# Patient Record
Sex: Male | Born: 1950 | Race: White | Hispanic: No | Marital: Single | Smoking: Never smoker
Health system: Southern US, Community
[De-identification: ages and names within clinical notes are randomized; demographics above are authoritative.]

## PROBLEM LIST (undated history)

## (undated) DIAGNOSIS — F1010 Alcohol abuse, uncomplicated: Secondary | ICD-10-CM

## (undated) DIAGNOSIS — H269 Unspecified cataract: Secondary | ICD-10-CM

## (undated) DIAGNOSIS — S069XAA Unspecified intracranial injury with loss of consciousness status unknown, initial encounter: Secondary | ICD-10-CM

## (undated) DIAGNOSIS — J40 Bronchitis, not specified as acute or chronic: Secondary | ICD-10-CM

## (undated) DIAGNOSIS — H35039 Hypertensive retinopathy, unspecified eye: Secondary | ICD-10-CM

## (undated) DIAGNOSIS — M1990 Unspecified osteoarthritis, unspecified site: Secondary | ICD-10-CM

## (undated) DIAGNOSIS — I8390 Asymptomatic varicose veins of unspecified lower extremity: Secondary | ICD-10-CM

## (undated) DIAGNOSIS — I10 Essential (primary) hypertension: Secondary | ICD-10-CM

## (undated) DIAGNOSIS — D496 Neoplasm of unspecified behavior of brain: Secondary | ICD-10-CM

## (undated) DIAGNOSIS — K219 Gastro-esophageal reflux disease without esophagitis: Secondary | ICD-10-CM

## (undated) DIAGNOSIS — S069X9A Unspecified intracranial injury with loss of consciousness of unspecified duration, initial encounter: Secondary | ICD-10-CM

## (undated) DIAGNOSIS — E669 Obesity, unspecified: Secondary | ICD-10-CM

## (undated) DIAGNOSIS — J45909 Unspecified asthma, uncomplicated: Secondary | ICD-10-CM

## (undated) HISTORY — DX: Alcohol abuse, uncomplicated: F10.10

## (undated) HISTORY — PX: HEMORRHOID SURGERY: SHX153

## (undated) HISTORY — DX: Obesity, unspecified: E66.9

## (undated) HISTORY — DX: Unspecified asthma, uncomplicated: J45.909

## (undated) HISTORY — DX: Hypertensive retinopathy, unspecified eye: H35.039

## (undated) HISTORY — DX: Unspecified osteoarthritis, unspecified site: M19.90

## (undated) HISTORY — DX: Asymptomatic varicose veins of unspecified lower extremity: I83.90

## (undated) HISTORY — DX: Gastro-esophageal reflux disease without esophagitis: K21.9

---

## 2000-10-03 ENCOUNTER — Encounter: Payer: Self-pay | Admitting: Family Medicine

## 2000-10-03 ENCOUNTER — Encounter: Admission: RE | Admit: 2000-10-03 | Discharge: 2000-10-03 | Payer: Self-pay | Admitting: Family Medicine

## 2001-12-25 ENCOUNTER — Emergency Department (HOSPITAL_COMMUNITY): Admission: EM | Admit: 2001-12-25 | Discharge: 2001-12-25 | Payer: Self-pay

## 2002-04-10 ENCOUNTER — Emergency Department (HOSPITAL_COMMUNITY): Admission: EM | Admit: 2002-04-10 | Discharge: 2002-04-10 | Payer: Self-pay

## 2002-11-18 ENCOUNTER — Emergency Department (HOSPITAL_COMMUNITY): Admission: EM | Admit: 2002-11-18 | Discharge: 2002-11-18 | Payer: Self-pay | Admitting: Emergency Medicine

## 2002-11-18 ENCOUNTER — Encounter: Payer: Self-pay | Admitting: Emergency Medicine

## 2003-07-14 ENCOUNTER — Encounter: Payer: Self-pay | Admitting: Emergency Medicine

## 2003-07-14 ENCOUNTER — Inpatient Hospital Stay (HOSPITAL_COMMUNITY): Admission: EM | Admit: 2003-07-14 | Discharge: 2003-07-25 | Payer: Self-pay

## 2003-07-14 ENCOUNTER — Encounter: Payer: Self-pay | Admitting: General Surgery

## 2003-07-31 ENCOUNTER — Inpatient Hospital Stay (HOSPITAL_COMMUNITY): Admission: EM | Admit: 2003-07-31 | Discharge: 2003-08-05 | Payer: Self-pay | Admitting: Emergency Medicine

## 2004-04-21 ENCOUNTER — Emergency Department (HOSPITAL_COMMUNITY): Admission: EM | Admit: 2004-04-21 | Discharge: 2004-04-21 | Payer: Self-pay | Admitting: Emergency Medicine

## 2004-07-10 ENCOUNTER — Emergency Department (HOSPITAL_COMMUNITY): Admission: EM | Admit: 2004-07-10 | Discharge: 2004-07-11 | Payer: Self-pay | Admitting: Emergency Medicine

## 2004-07-10 ENCOUNTER — Emergency Department (HOSPITAL_COMMUNITY): Admission: EM | Admit: 2004-07-10 | Discharge: 2004-07-10 | Payer: Self-pay | Admitting: Emergency Medicine

## 2004-07-13 ENCOUNTER — Emergency Department (HOSPITAL_COMMUNITY): Admission: EM | Admit: 2004-07-13 | Discharge: 2004-07-14 | Payer: Self-pay | Admitting: Emergency Medicine

## 2004-07-20 ENCOUNTER — Emergency Department (HOSPITAL_COMMUNITY): Admission: EM | Admit: 2004-07-20 | Discharge: 2004-07-21 | Payer: Self-pay | Admitting: Emergency Medicine

## 2004-08-20 ENCOUNTER — Inpatient Hospital Stay (HOSPITAL_COMMUNITY): Admission: EM | Admit: 2004-08-20 | Discharge: 2004-08-25 | Payer: Self-pay | Admitting: Psychiatry

## 2004-08-20 ENCOUNTER — Ambulatory Visit: Payer: Self-pay | Admitting: Psychiatry

## 2004-08-27 ENCOUNTER — Emergency Department (HOSPITAL_COMMUNITY): Admission: EM | Admit: 2004-08-27 | Discharge: 2004-08-27 | Payer: Self-pay | Admitting: Emergency Medicine

## 2004-09-01 ENCOUNTER — Emergency Department (HOSPITAL_COMMUNITY): Admission: EM | Admit: 2004-09-01 | Discharge: 2004-09-01 | Payer: Self-pay | Admitting: Emergency Medicine

## 2004-09-12 ENCOUNTER — Emergency Department (HOSPITAL_COMMUNITY): Admission: EM | Admit: 2004-09-12 | Discharge: 2004-09-12 | Payer: Self-pay | Admitting: Emergency Medicine

## 2004-10-31 ENCOUNTER — Emergency Department (HOSPITAL_COMMUNITY): Admission: EM | Admit: 2004-10-31 | Discharge: 2004-10-31 | Payer: Self-pay | Admitting: *Deleted

## 2005-04-22 ENCOUNTER — Emergency Department (HOSPITAL_COMMUNITY): Admission: EM | Admit: 2005-04-22 | Discharge: 2005-04-22 | Payer: Self-pay | Admitting: Emergency Medicine

## 2005-06-19 ENCOUNTER — Emergency Department (HOSPITAL_COMMUNITY): Admission: EM | Admit: 2005-06-19 | Discharge: 2005-06-20 | Payer: Self-pay | Admitting: Emergency Medicine

## 2005-06-21 ENCOUNTER — Encounter (HOSPITAL_COMMUNITY): Admission: RE | Admit: 2005-06-21 | Discharge: 2005-09-19 | Payer: Self-pay | Admitting: Orthopaedic Surgery

## 2006-08-05 ENCOUNTER — Emergency Department (HOSPITAL_COMMUNITY): Admission: EM | Admit: 2006-08-05 | Discharge: 2006-08-05 | Payer: Self-pay | Admitting: Emergency Medicine

## 2007-04-26 ENCOUNTER — Emergency Department (HOSPITAL_COMMUNITY): Admission: EM | Admit: 2007-04-26 | Discharge: 2007-04-26 | Payer: Self-pay | Admitting: Emergency Medicine

## 2009-10-02 ENCOUNTER — Emergency Department (HOSPITAL_COMMUNITY): Admission: EM | Admit: 2009-10-02 | Discharge: 2009-10-02 | Payer: Self-pay | Admitting: Family Medicine

## 2010-05-05 ENCOUNTER — Encounter: Admission: RE | Admit: 2010-05-05 | Discharge: 2010-05-05 | Payer: Self-pay | Admitting: Podiatry

## 2010-12-08 ENCOUNTER — Inpatient Hospital Stay (HOSPITAL_COMMUNITY)
Admission: EM | Admit: 2010-12-08 | Discharge: 2010-12-13 | Payer: Self-pay | Source: Home / Self Care | Attending: Internal Medicine | Admitting: Internal Medicine

## 2010-12-09 NOTE — H&P (Signed)
NAMEMarland Kitchen  KEYANDRE, PILEGGI NO.:  [id number]  MEDICAL RECORD NO.:  [id number]          PATIENT TYPE:  EMS  LOCATION:  ED                           FACILITY:  [facility]  PHYSICIAN:  Vania Rea, M.D. DATE OF BIRTH:  08/13/1951  DATE OF ADMISSION:  12/08/2010 DATE OF DISCHARGE:                             HISTORY & PHYSICAL   PRIMARY CARE PHYSICIAN:  Knox Royalty, M.D.  CHIEF COMPLAINT:  Abnormal labs.  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  Dr. Yetta Barre reports that the patient takes desmopressin for his diabetes insipidus and additionally takes Micardis/HCTZ for his hypertension; also takes ibuprofen for pain. Patient has had problems with low potassium and initially his desmopressin was adjusted. And when seen in November, his potassium continued to be low. His HCTZ was discontinued. In other words, his Micardis with HCTZ was switched out to plain Micardis. At that time, his labs showed a sodium of 130, a potassium of 3.1, a BUN of 12 and a creatinine of 0.3.  Patient returned for followup yesterday and it was found that he had continued to take his HCTZ. And followup labs done yesterday revealed a sodium of 123, a BUN of 43, a creatinine of 3.19. Patient was contacted today and sent to the [facility] Emergency Room for further management.  Patient's only complaint is pain in his right jaw which comes and goes. He denies any chest pain, shortness of breath or palpitations.  He denies any nausea, vomiting or diarrhea.  He denies any black or bloody stool. He has apparently been drinking a lot of alcohol, difficult to quantify.  PAST MEDICAL HISTORY: 1. 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 2. History of alcohol abuse. 3. Depression. 4. Questionable history of mental retardation. 5. Hypertension.  MEDICATIONS: 1. Micardis/HCTZ 8/25; please note that he is prescribed this without     HCTZ. 2. Ibuprofen 800 mg 3 times daily as needed, takes frequently. 3. Desmopressin 0.1 mg at bedtime. 4. Tramadol 50 mg every 4 hours when necessary.  ALLERGIES:  No known drug allergies.  SOCIAL HISTORY:  Denies illicit drug use. Denies tobacco abuse, but reportedly has been drinking alcohol as a substitute for water, as he has been advised to cut down on his drinking of water.  FAMILY HISTORY:  History of cancer in his family.  No family history of alcoholism or bipolar disorder.  IMMUNIZATION HISTORY:  Patient did receive influenza vaccine on November 15 at his doctor's office, and he did receive a tetanus-DAP in January 2011 at his doctor's office.  REVIEW OF SYSTEMS:  Other than noted above, unremarkable.  PHYSICAL EXAMINATION:  Pleasant middle-aged Caucasian gentleman, lying flat in the stretcher in no acute distress. VITALS: His temperature is 97.3, his pulse is 70, respiration 18, blood pressure 92/62.  He is saturating at 96% on room air. HEENT: His pupils are round and  equal.  Mucous membranes are pink, dry, anicteric.  No cervical lymphadenopathy.  No erythema around the jaw. He does have carious teeth.  No thyromegaly or carotid bruits. CHEST: Clear to auscultation bilaterally. CARDIOVASCULAR SYSTEM: Regular rhythm without murmur. ABDOMEN: Soft, nontender.  No masses. EXTREMITIES: Has flexion contractures at his right elbow and fingers of his right hand, however, he has a very strong grip bilaterally. And other than the contractures, he has no focal weakness or no other focal lateralizing signs.  LABORATORY DATA:  His white count is 5.5, hemoglobin 12.4, MCV 88.7, his platelets are 253. He has a normal differential. His alcohol level  is undetectable.  His sodium is 122, potassium 2.0, chloride 90, CO2 19, glucose is 122, BUN 46, creatinine 3.3.  His calcium is 9.2.  His albumin is 4.1. His liver functions are otherwise unremarkable.  PT, PTT and INR are normal. A two-view chest x-ray shows no acute disease in his chest.  ASSESSMENT: 1. Acute renal failure, questionable acute tubular necrosis. 2. Hypokalemia in the setting of renal failure and dehydration. 3. Hypotension related to dehydration and acute renal failure. 4. Hyponatremia 5. Traumatic brain injury. 6. Diabetes insipidus.  PLAN: 1. We will admit this gentleman for hypertension and investigation of     his renal failure.  We will replete his potassium, give vigorous     intravenous normal saline, consult nephrologist for assistance with     management. 2. We will hold his ACE inhibitor, HCTZ, NSAIDs at this time. Other     plans as per orders.     Vania Rea, M.D.     LC/MEDQ  D:  12/08/2010  T:  12/08/2010  Job:  [id number]  cc:   Knox Royalty, MD  Electronically Signed by Vania Rea M.D. on 12/09/2010 09:38:05 PM

## 2010-12-12 LAB — CORTISOL: Cortisol, Plasma: 10.6 ug/dL

## 2010-12-12 LAB — DIFFERENTIAL
Basophils Absolute: 0 10*3/uL (ref 0.0–0.1)
Eosinophils Absolute: 0.2 10*3/uL (ref 0.0–0.7)
Eosinophils Relative: 3 % (ref 0–5)

## 2010-12-12 LAB — COMPREHENSIVE METABOLIC PANEL
ALT: 16 U/L (ref 0–53)
Alkaline Phosphatase: 53 U/L (ref 39–117)
CO2: 19 mEq/L (ref 19–32)
Chloride: 90 mEq/L — ABNORMAL LOW (ref 96–112)
GFR calc non Af Amer: 19 mL/min — ABNORMAL LOW (ref >60)
Glucose, Bld: 122 mg/dL — ABNORMAL HIGH (ref 70–99)
Potassium: 2 mEq/L — CL (ref 3.5–5.1)
Sodium: 122 mEq/L — ABNORMAL LOW (ref 135–145)

## 2010-12-12 LAB — ETHANOL: Alcohol, Ethyl (B): <5 mg/dL (ref 0–10)

## 2010-12-12 LAB — URINALYSIS, ROUTINE W REFLEX MICROSCOPIC
Bilirubin Urine: NEGATIVE
Nitrite: NEGATIVE
Nitrite: NEGATIVE
Specific Gravity, Urine: 1.012 (ref 1.005–1.030)
Urine Glucose, Fasting: NEGATIVE mg/dL
pH: 6 (ref 5.0–8.0)
pH: 6.5 (ref 5.0–8.0)

## 2010-12-12 LAB — CBC
Hemoglobin: 10.1 g/dL — ABNORMAL LOW (ref 13.0–17.0)
MCV: 88.7 fL (ref 78.0–100.0)
Platelets: 253 10*3/uL (ref 150–400)
Platelets: 183 10*3/uL (ref 150–400)
RBC: 3.06 MIL/uL — ABNORMAL LOW (ref 4.22–5.81)
RDW: 13.3 % (ref 11.5–15.5)
WBC: 5.5 10*3/uL (ref 4.0–10.5)
WBC: 4.7 10*3/uL (ref 4.0–10.5)

## 2010-12-12 LAB — CK: Total CK: 66 U/L (ref 7–232)

## 2010-12-12 LAB — BASIC METABOLIC PANEL
Chloride: 104 mEq/L (ref 96–112)
GFR calc Af Amer: 44 mL/min — ABNORMAL LOW (ref >60)
GFR calc Af Amer: 59 mL/min — ABNORMAL LOW (ref >60)
GFR calc non Af Amer: 37 mL/min — ABNORMAL LOW (ref >60)
GFR calc non Af Amer: 49 mL/min — ABNORMAL LOW (ref >60)
Glucose, Bld: 105 mg/dL — ABNORMAL HIGH (ref 70–99)
Potassium: 2.8 mEq/L — ABNORMAL LOW (ref 3.5–5.1)
Potassium: 2.8 mEq/L — ABNORMAL LOW (ref 3.5–5.1)
Sodium: 125 mEq/L — ABNORMAL LOW (ref 135–145)
Sodium: 126 mEq/L — ABNORMAL LOW (ref 135–145)

## 2010-12-12 LAB — CREATININE, URINE, RANDOM: Creatinine, Urine: 39.4 mg/dL

## 2010-12-12 LAB — MAGNESIUM: Magnesium: 2.4 mg/dL (ref 1.5–2.5)

## 2010-12-12 LAB — TSH: TSH: 1.497 u[IU]/mL (ref 0.350–4.500)

## 2010-12-12 LAB — RAPID URINE DRUG SCREEN, HOSP PERFORMED: Cocaine: NOT DETECTED

## 2010-12-12 LAB — PROTIME-INR: Prothrombin Time: 13.6 seconds (ref 11.6–15.2)

## 2010-12-12 LAB — POTASSIUM: Potassium: 2.9 mEq/L — ABNORMAL LOW (ref 3.5–5.1)

## 2010-12-13 LAB — COMPREHENSIVE METABOLIC PANEL
ALT: 13 U/L (ref 0–53)
AST: 11 U/L (ref 0–37)
Alkaline Phosphatase: 20 U/L — ABNORMAL LOW (ref 39–117)
CO2: 16 mEq/L — ABNORMAL LOW (ref 19–32)
Calcium: 8.1 mg/dL — ABNORMAL LOW (ref 8.4–10.5)
GFR calc Af Amer: >60 mL/min (ref >60)
Glucose, Bld: 89 mg/dL (ref 70–99)
Potassium: 3.6 mEq/L (ref 3.5–5.1)
Sodium: 137 mEq/L (ref 135–145)
Total Protein: 4.9 g/dL — ABNORMAL LOW (ref 6.0–8.3)

## 2010-12-13 LAB — BASIC METABOLIC PANEL
BUN: 22 mg/dL (ref 6–23)
CO2: 17 mEq/L — ABNORMAL LOW (ref 19–32)
CO2: 17 mEq/L — ABNORMAL LOW (ref 19–32)
Calcium: 8.1 mg/dL — ABNORMAL LOW (ref 8.4–10.5)
Chloride: 116 mEq/L — ABNORMAL HIGH (ref 96–112)
Creatinine, Ser: 0.83 mg/dL (ref 0.4–1.5)
GFR calc Af Amer: >60 mL/min (ref >60)
GFR calc non Af Amer: >60 mL/min (ref >60)
Glucose, Bld: 87 mg/dL (ref 70–99)
Potassium: 3.5 mEq/L (ref 3.5–5.1)
Sodium: 133 mEq/L — ABNORMAL LOW (ref 135–145)
Sodium: 136 mEq/L (ref 135–145)

## 2010-12-13 LAB — FERRITIN: Ferritin: 117 ng/mL (ref 22–322)

## 2010-12-13 LAB — CBC
HCT: 27.5 % — ABNORMAL LOW (ref 39.0–52.0)
Hemoglobin: 10 g/dL — ABNORMAL LOW (ref 13.0–17.0)
MCHC: 36.4 g/dL — ABNORMAL HIGH (ref 30.0–36.0)
MCV: 89.9 fL (ref 78.0–100.0)
RDW: 13.5 % (ref 11.5–15.5)
WBC: 3.2 10*3/uL — ABNORMAL LOW (ref 4.0–10.5)

## 2010-12-13 LAB — PROTEIN, URINE, RANDOM: Total Protein, Urine: 3 mg/dL

## 2010-12-13 LAB — SODIUM, URINE, RANDOM: Sodium, Ur: 38 mEq/L

## 2010-12-13 LAB — IRON AND TIBC
Iron: 93 ug/dL (ref 42–135)
UIBC: 102 ug/dL

## 2010-12-14 LAB — PROTEIN ELECTROPH W RFLX QUANT IMMUNOGLOBULINS
Albumin ELP: 62.4 % (ref 55.8–66.1)
Alpha-1-Globulin: 5.5 % — ABNORMAL HIGH (ref 2.9–4.9)
Beta 2: 3.2 % (ref 3.2–6.5)
Beta Globulin: 5.1 % (ref 4.7–7.2)
Gamma Globulin: 13.7 % (ref 11.1–18.8)

## 2010-12-22 NOTE — Discharge Summary (Signed)
NAMEMarland Kitchen  [name], [name] NO.:  [id number]  MEDICAL RECORD NO.:  [id number]          PATIENT TYPE:  INP  LOCATION:  1414                         FACILITY:  [facility]  PHYSICIAN:  Pincus Large, MD     DATE OF BIRTH:  1951/10/06  DATE OF ADMISSION:  12/08/2010 DATE OF DISCHARGE:  12/12/2010                              DISCHARGE SUMMARY   PRIMARY CARE PHYSICIAN:  Dr. Knox Royalty.  TIME SPENT ON DISCHARGE SUMMARY:  35 minutes.  REASON FOR ADMISSION:  Abnormal labs.  DISCHARGE DIAGNOSES: 1. Acute renal failure most likely secondary to acute tubular     necrosis, possibly papillary necrosis, given the use of new ARB     with nonsteroidal. 2. Hyponatremia, resolved. 3. Anemia. 4. Metabolic acidosis, resolved. 5. Hypokalemia, resolved. 6. History of diabetes insipidus. 7. History of alcohol abuse. 8. Questionable history of mental retardation.  DISCHARGE MEDICATIONS: 1. Folic acid 1 mg daily. 2. Thiamine 100 mg daily. 3. Tramadol 50 mg q.6 p.r.n. 4. Desmopressin 0.1 mg p.o. daily.  LABORATORY DATA AND IMAGING STUDIES:  Images that were done; chest x-ray was done on December 08, 2010, which shows no acute lung disease. Ultrasound was done on January 21 which showed no acute finding, bilateral renal cortical echogenicity which suggests chronic medical renal problem.  Chest x-ray was done on January 23, which showed no active lung disease.  Labs when the patient came in, his WBC was 5.8, hemoglobin was 12.4, platelets were 253.  His potassium was 2, sodium was 122, chloride 90, bicarb was 19, glucose was 122, BUN was 46, creatinine was 3.30.  UA was normal.  Labs on discharge, his sodium is 137, potassium is 3.6, chloride is 117, bicarb is 16, glucose is 89, BUN is 9, and creatinine 0.84.  HOSPITAL COURSE:  Mr. Hensch is a 60 year old white male with past medical history significant for ongoing alcohol abuse, hypertension, history of traumatic brain injury,  which by report, was complicated by diabetes insipidus.  He has been managed by desmopressin as an outpatient, but the patient reported that he was prescribed desmopressin, Micardis, hydrochlorothiazide for his hypertension.  He was found to have low potassium and questionable low sodium.  The patient was told to watch his water intake.  His hydrochlorothiazide was discontinued, but continued Micardis.  The patient admitted to drinking 9 beers a day and went to have labs on January 18, which was found to have a creatinine of 3.1, BUN 43, sodium 123.  Creatinine was recently 0.7.  The patient was admitted on January 19 for abnormal blood work. He received IV fluid.  ACE was discontinued.  Hydrochlorothiazide was discontinued.  Nonsteroidal anti-inflammatory was discontinued.  He was seen by the nephrologist, Dr. Terrial Rhodes.  The patient's acute renal failure resolved.  His hyponatremia resolved.  His vital signs throughout admission, his blood pressure was in normal limits even on the lower side, so I think I could discontinue the blood pressure medication, and to follow up with his primary care to check his blood pressure, may be need to start a new medication other than just ARB or ACE at  this point.  The patient's vitals on discharge were temperature 98, pulse of 73, respirations 20, blood pressure 103/67.  We had a social work consult and the patient wanted to go home.  The patient has a guardian who has been taking care of the patient at home for a long time.  The patient had a PT eval and they recommended home health PT as an outpatient.  DISPOSITION:  Home.  DIET:  2-g sodium.          ______________________________ Pincus Large, MD     SA/MEDQ  D:  12/13/2010  T:  12/13/2010  Job:  [id number]  Electronically Signed by Pincus Large MD on 12/22/2010 03:32:42 PM

## 2010-12-23 NOTE — Consult Note (Signed)
NAMEMarland Kitchen  VELTON, ROSELLE NO.:  [id number]  MEDICAL RECORD NO.:  [id number]          PATIENT TYPE:  INP  LOCATION:  1414                         [facility]ACILITY:  [facility]  PHYSICIAN:  Terrial Rhodes, M.D.DATE OF BIRTH:  23-Dec-1950  DATE OF CONSULTATION:  12/09/2010 DATE OF DISCHARGE:                                CONSULTATION   CONSULTING PHYSICIAN:  Vania Rea, MD  REASON FOR CONSULTATION:  Acute renal failure and hyponatremia.  HISTORY OF PRESENT ILLNESS:  Mr. Cowles is a 60 year old white male with past medical history significant for ongoing alcohol abuse, hypertension and a history of traumatic brain injury which by report was complicated by diabetes insipidus, has been managed with desmopressin by Dr. Knox Royalty as an outpatient.  According to the reports, the patient was prescribed desmopressin, Micardis HCT for hypertension.  He was found to have a low potassium and questionable low sodium as the patient reports he was told to watch his water intake.  His hydrochlorothiazide was discontinued  but continued to Micardis.  The patient admits to drinking nine beers a day and went to have repeat laboratories on January 18th and was instructed to go to the hospital on the 19th because his serum sodium was 123, BUN 43 and creatinine 3.19. Previously, his creatinine was less than 1 and most recently 0.7.  We were asked to further evaluate and manage his acute renal failure as well as his hyponatremia.  The patient denies any nausea, vomiting, diarrhea.  He did report decreased urine output the day prior to admission and he has been taking ibuprofen for pain in his legs over the last several days as well.  ALLERGIES:  He has no known drug allergies.  PAST MEDICAL HISTORY: 1. Alcohol abuse. 2. Hypertension. 3. Depression. 4. History of traumatic brain injury. 5. History of diabetes insipidus post traumatic brain injury. 6. Medical  nonadherence.  OUTPATIENT MEDICATIONS: 1. Micardis 80 mg a day. 2. Ibuprofen 800 mg 3 times a day. 3. Desmopressin 0.1 mg at bedtime. 4. Tramadol 50 mg every 4 hours.  SOCIAL HISTORY:  Lives alone.  Admits to ongoing alcohol abuse.  No IV drug use.  FAMILY HISTORY:  No family history of kidney disease.  REVIEW OF SYSTEMS:  GENERAL:  He denies any anorexia or malaise. OPHTHALMIC:  No blurred vision, photophobia.  ENT:  No tinnitus, dysphagia or odynophagia.  CARDIAC:  No chest pain, palpitations, orthopnea or PND.  PULMONARY:  No shortness of breath, hemoptysis or productive cough.  GI:  No nausea, vomiting, hematochezia, melena or bright red blood per rectum.  GU:  No dysuria, pyuria, hematuria, urgency, frequency or retention.  RHEUMATOLOGIC:  Complains of bilateral leg pain.  All other systems negative.  PHYSICAL EXAMINATION:  GENERAL:  This is a well-developed, well- nourished man in no apparent distress. VITAL SIGNS:  Temperature 97.6, pulse 68, blood pressure 95/61, respiratory rate is 16.  His intake was 1080, output was 600.  Today, he has put out 1.1 liters. HEENT:  Head normocephalic, atraumatic.  Extraocular muscles intact.  No icterus.  Oropharynx without lesions. NECK:  Supple.  No lymphadenopathy or bruits.  LUNGS:  Clear to auscultation and percussion bilaterally.  No rales, rubs or rhonchi. CARDIAC EXAMINATION:  Regular rate and rhythm.  No precordial rub appreciated. ABDOMINAL EXAMINATION:  Normoactive bowel sounds, soft, nontender, nondistended. EXTREMITIES:  No pitting edema.  LABORATORY DATA AND DIAGNOSTIC STUDIES:  White blood cell count 4.7, hemoglobin 10.1, platelets 183.  Magnesium 2.4, sodium 125, potassium 2.8, chloride 103, CO2 of 15, BUN 41, creatinine 1.9, glucose 150, calcium 8.2.  Urine drug screen was negative.  CPK level was 66.  Urine toxicology screen was negative.  ASSESSMENT/PLAN: 1. Acute kidney injury.  This is likely due to acute  tubular necrosis,     possibly papillary necrosis, given his concomitant use of an ARB     with nonsteroidals; however, given his relative hypotension, this     could be ischemic acute tubular necrosis.  We will check ultrasound     as per primary service.  His urinalysis was negative for blood and     protein, so an acute glomerulonephritis is unlikely.  His     creatinine is already markedly improved with volume replacement.     We will continue to hold ARB and follow his urine output and renal     function.  We will also check protein electrophoresis in both serum     and urine to rule out myeloma and we will follow. 2. Hyponatremia.  Again this is new as he has a history of diabetes     insipidus and should have hypernatremia.  It has been well     documented hyponatremia associated with hydrochlorothiazide as well     as nonsteroidals and angiotensin receptor blockers.  To complicate     things further is his alcohol use and could have the beer     potomania.  We will check urine sodium, urine creatinine, urine     osmolality, serum osmolality, cortisol level, TSH level and we will     continue to follow his sodium closely.  It has gone from 122     yesterday to 125.  We will check it again later today and follow. 3. Anemia.  We will check iron stores, B12, folic acid, guaiac stools     as well as an SPEP and UPEP as above. 4. Hypotension, unclear etiology.  He is off his blood pressure     medicines and will follow.  He is afebrile and asymptomatic. 5. Metabolic acidosis secondary to acute renal failure as above.  We     will follow.  We may want to add bicarbonate.  He has a normal gap     acidosis.  Denies any other ingestion of agents. 6. Hypokalemia, likely due to alcoholism.  We will replete and follow. 7. Leg pain.  Would avoid any nonsteroidals or Cox II inhibitors given     his acute renal failure.  Thank you for this consultation.  We will continue to follow along  with you          ______________________________ Terrial Rhodes, M.D.    JC/MEDQ  D:  12/09/2010  T:  12/09/2010  Job:  [id number]  Electronically Signed by Terrial Rhodes M.D. on 12/23/2010 12:19:11 PM
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
## 2011-04-07 NOTE — Op Note (Signed)
NAME:  [name], [name] NO.:  [id number]   MEDICAL RECORD NO.:  [id number]                   PATIENT TYPE:  OBV   LOCATION:  2550                                 FACILITY:  [facility]   PHYSICIAN:  Myrtie Neither, M.D.                 DATE OF BIRTH:  January 09, 1951   DATE OF PROCEDURE:  07/14/2003  DATE OF DISCHARGE:                                 OPERATIVE REPORT   PREOPERATIVE DIAGNOSIS:  Open left tibial fracture, large open wound.   POSTOPERATIVE DIAGNOSIS:  Open left tibial fracture, large open wound.   PROCEDURE:  Irrigation, excisional debridement, curettement of the tibia  fracture, wound closure, and knee immobilization.   SURGEON:  Myrtie Neither, M.D.   ANESTHESIA:  General   The patient was taken to the operating room and was given preop medications  and placed general endotracheal anesthesia and intubated.  The left lower  leg was scrubbed with Betadine scrub and painted with Betadine solution and  draped in a sterile manner.  Bovie was used for hemostasis.   DESCRIPTION OF THE WOUND:  There was an 8-inch transverse laceration over  the proximal tibial area anteriorly with stellate edges with obvious gross  contamination with debridement.  Inspection of the wound revealed that the  tibia was broken with the outer cortex which apparently must have cut  against the pavement surface.  There are black fragments of either metal or  plastic that was inside of the wound. The wound was contaminated down to the  fascia both medially and laterally.  The skin edges were initially excised  and debrided; the subcutaneous tissue was excisionally debrided; loose  fragments were removed.  Copious and abundant irrigation with some pulsing  irrigation was done, also with the use of IV antibiotic.  The fracture site  along the anterior medial border of the tibia was curetted with a curette  and then thoroughly irrigated.   The fracture, itself, involved the  medial cortex of the tibia.  After  thorough debridement and further inspection, both proximally and distally  into the wound, then the wound was closed with 2-0 Vicryl for the  subcutaneous and skin staples for the skin.  A bulky compressive dressing  was applied and knee immobilizer was applied.   Examination of the knee was done under anesthesia.  Range of motion was  full.  Good medial and lateral stability.  Negative Lachman, negative drawer  test.  The patient tolerated the procedure quite well and went to recovery  room in stable and satisfactory condition.                                               Myrtie Neither, M.D.    AC/MEDQ  D:  07/14/2003  T:  07/15/2003  Job:  [id number]
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
## 2011-11-26 ENCOUNTER — Inpatient Hospital Stay (HOSPITAL_COMMUNITY)
Admission: EM | Admit: 2011-11-26 | Discharge: 2011-11-30 | DRG: 203 | Disposition: A | Discharge status: Home Health | Payer: Medicare Other | Attending: Family Medicine | Admitting: Family Medicine

## 2011-11-26 ENCOUNTER — Encounter: Payer: Self-pay | Admitting: *Deleted

## 2011-11-26 ENCOUNTER — Emergency Department (HOSPITAL_COMMUNITY): Payer: Medicare Other

## 2011-11-26 DIAGNOSIS — Z23 Encounter for immunization: Secondary | ICD-10-CM

## 2011-11-26 DIAGNOSIS — R509 Fever, unspecified: Secondary | ICD-10-CM

## 2011-11-26 DIAGNOSIS — J45909 Unspecified asthma, uncomplicated: Secondary | ICD-10-CM | POA: Diagnosis present

## 2011-11-26 DIAGNOSIS — R062 Wheezing: Secondary | ICD-10-CM

## 2011-11-26 DIAGNOSIS — J209 Acute bronchitis, unspecified: Principal | ICD-10-CM | POA: Diagnosis present

## 2011-11-26 DIAGNOSIS — F79 Unspecified intellectual disabilities: Secondary | ICD-10-CM | POA: Diagnosis present

## 2011-11-26 DIAGNOSIS — I10 Essential (primary) hypertension: Secondary | ICD-10-CM | POA: Diagnosis present

## 2011-11-26 LAB — RESPIRATORY VIRUS PANEL
Bocavirus: NOT DETECTED
Coronavirus229E: NOT DETECTED
CoronavirusHKU1: NOT DETECTED
CoronavirusOC43: NOT DETECTED
Influenza B: NOT DETECTED
Metapneumovirus: NOT DETECTED
Parainfluenza 1: NOT DETECTED
Parainfluenza 2: NOT DETECTED
Parainfluenza 3: NOT DETECTED
Parainfluenza 4: NOT DETECTED
Respiratory Syncytial Virus A: DETECTED — AB

## 2011-11-26 LAB — POCT I-STAT, CHEM 8
Calcium, Ion: 1.23 mmol/L (ref 1.12–1.32)
Chloride: 105 mEq/L (ref 96–112)
Glucose, Bld: 98 mg/dL (ref 70–99)
HCT: 41 % (ref 39.0–52.0)
TCO2: 25 mmol/L (ref 0–100)

## 2011-11-26 LAB — CBC
HCT: 40 % (ref 39.0–52.0)
MCH: 30.5 pg (ref 26.0–34.0)
MCHC: 33.5 g/dL (ref 30.0–36.0)
MCV: 90.9 fL (ref 78.0–100.0)
Platelets: 195 10*3/uL (ref 150–400)
RDW: 14.4 % (ref 11.5–15.5)

## 2011-11-26 LAB — DIFFERENTIAL
Basophils Absolute: 0 10*3/uL (ref 0.0–0.1)
Basophils Relative: 0 % (ref 0–1)
Eosinophils Absolute: 0.1 10*3/uL (ref 0.0–0.7)
Eosinophils Relative: 2 % (ref 0–5)
Lymphocytes Relative: 20 % (ref 12–46)
Monocytes Absolute: 0.7 10*3/uL (ref 0.1–1.0)

## 2011-11-26 MED ORDER — METHYLPREDNISOLONE SODIUM SUCC 125 MG IJ SOLR
125.0000 mg | INTRAMUSCULAR | Status: AC
  Administered 2011-11-26: 125 mg via INTRAVENOUS
  Filled 2011-11-26: qty 2

## 2011-11-26 MED ORDER — ACETAMINOPHEN 325 MG PO TABS
ORAL_TABLET | ORAL | Status: AC
  Administered 2011-11-26: 650 mg via ORAL
  Filled 2011-11-26: qty 2

## 2011-11-26 MED ORDER — OXYCODONE HCL 5 MG PO TABS: 5.0000 mg | ORAL_TABLET | ORAL | Status: DC | PRN

## 2011-11-26 MED ORDER — DEXTROSE 5 % IV SOLN
1.0000 g | INTRAVENOUS | Status: DC
  Administered 2011-11-27 – 2011-11-29 (×3): 1 g via INTRAVENOUS
  Filled 2011-11-26 (×4): qty 10

## 2011-11-26 MED ORDER — IPRATROPIUM BROMIDE 0.02 % IN SOLN
0.5000 mg | Freq: Once | RESPIRATORY_TRACT | Status: AC
  Administered 2011-11-26: 0.5 mg via RESPIRATORY_TRACT
  Filled 2011-11-26: qty 2.5

## 2011-11-26 MED ORDER — ALUM & MAG HYDROXIDE-SIMETH 200-200-20 MG/5ML PO SUSP
30.0000 mL | Freq: Four times a day (QID) | ORAL | Status: DC | PRN
  Filled 2011-11-26: qty 30

## 2011-11-26 MED ORDER — SODIUM CHLORIDE 0.9 % IV SOLN
INTRAVENOUS | Status: AC
  Administered 2011-11-26: 21:00:00 via INTRAVENOUS

## 2011-11-26 MED ORDER — OSELTAMIVIR PHOSPHATE 75 MG PO CAPS
75.0000 mg | ORAL_CAPSULE | Freq: Two times a day (BID) | ORAL | Status: DC
  Administered 2011-11-27 – 2011-11-29 (×6): 75 mg via ORAL
  Filled 2011-11-26 (×7): qty 1

## 2011-11-26 MED ORDER — DEXTROSE 5 % IV SOLN
500.0000 mg | INTRAVENOUS | Status: DC
  Administered 2011-11-27 – 2011-11-30 (×4): 500 mg via INTRAVENOUS
  Filled 2011-11-26 (×5): qty 500

## 2011-11-26 MED ORDER — DEXTROSE 5 % IV SOLN: 500.0000 mg | Freq: Once | INTRAVENOUS | Status: DC

## 2011-11-26 MED ORDER — SODIUM CHLORIDE 0.9 % IV SOLN
INTRAVENOUS | Status: DC
  Administered 2011-11-27: via INTRAVENOUS

## 2011-11-26 MED ORDER — ALBUTEROL SULFATE (5 MG/ML) 0.5% IN NEBU
2.5000 mg | INHALATION_SOLUTION | RESPIRATORY_TRACT | Status: DC
  Administered 2011-11-26 – 2011-11-27 (×4): 2.5 mg via RESPIRATORY_TRACT
  Filled 2011-11-26 (×4): qty 0.5

## 2011-11-26 MED ORDER — ALBUTEROL SULFATE (5 MG/ML) 0.5% IN NEBU
5.0000 mg | INHALATION_SOLUTION | Freq: Once | RESPIRATORY_TRACT | Status: AC
  Administered 2011-11-26: 5 mg via RESPIRATORY_TRACT
  Filled 2011-11-26: qty 0.5

## 2011-11-26 MED ORDER — ALBUTEROL SULFATE (5 MG/ML) 0.5% IN NEBU: 2.5000 mg | INHALATION_SOLUTION | Freq: Once | RESPIRATORY_TRACT | Status: DC

## 2011-11-26 MED ORDER — ENOXAPARIN SODIUM 40 MG/0.4ML ~~LOC~~ SOLN
40.0000 mg | SUBCUTANEOUS | Status: DC
  Administered 2011-11-27 – 2011-11-30 (×4): 40 mg via SUBCUTANEOUS
  Filled 2011-11-26 (×4): qty 0.4

## 2011-11-26 MED ORDER — ZOLPIDEM TARTRATE 5 MG PO TABS
5.0000 mg | ORAL_TABLET | Freq: Every evening | ORAL | Status: DC | PRN
Start: 2011-11-26 — End: 2011-11-30

## 2011-11-26 MED ORDER — ONDANSETRON HCL 4 MG PO TABS: 4.0000 mg | ORAL_TABLET | Freq: Four times a day (QID) | ORAL | Status: DC | PRN

## 2011-11-26 MED ORDER — ALBUMIN HUMAN 5 % IV SOLN: 12.5000 g | Freq: Once | INTRAVENOUS | Status: DC

## 2011-11-26 MED ORDER — METHYLPREDNISOLONE SODIUM SUCC 125 MG IJ SOLR
60.0000 mg | Freq: Two times a day (BID) | INTRAMUSCULAR | Status: DC
Start: 2011-11-28 — End: 2011-11-27
  Filled 2011-11-26: qty 0.96

## 2011-11-26 MED ORDER — IPRATROPIUM BROMIDE 0.02 % IN SOLN
0.5000 mg | RESPIRATORY_TRACT | Status: DC
  Administered 2011-11-26 – 2011-11-27 (×4): 0.5 mg via RESPIRATORY_TRACT
  Filled 2011-11-26 (×4): qty 2.5

## 2011-11-26 MED ORDER — ALBUTEROL SULFATE (5 MG/ML) 0.5% IN NEBU
2.5000 mg | INHALATION_SOLUTION | Freq: Once | RESPIRATORY_TRACT | Status: AC
  Administered 2011-11-26: 2.5 mg via RESPIRATORY_TRACT
  Filled 2011-11-26: qty 0.5

## 2011-11-26 MED ORDER — ONDANSETRON HCL 4 MG/2ML IJ SOLN: 4.0000 mg | Freq: Four times a day (QID) | INTRAMUSCULAR | Status: DC | PRN

## 2011-11-26 MED ORDER — DEXTROSE 5 % IV SOLN
1.0000 g | Freq: Once | INTRAVENOUS | Status: AC
  Administered 2011-11-26: 1 g via INTRAVENOUS
  Filled 2011-11-26: qty 10

## 2011-11-26 MED ORDER — HYDROMORPHONE HCL PF 1 MG/ML IJ SOLN: 0.5000 mg | INTRAMUSCULAR | Status: DC | PRN

## 2011-11-26 MED ORDER — ACETAMINOPHEN 325 MG PO TABS
650.0000 mg | ORAL_TABLET | Freq: Once | ORAL | Status: AC
  Administered 2011-11-26: 650 mg via ORAL

## 2011-11-26 MED ORDER — ACETAMINOPHEN 650 MG RE SUPP: 650.0000 mg | Freq: Four times a day (QID) | RECTAL | Status: DC | PRN

## 2011-11-26 MED ORDER — METHYLPREDNISOLONE SODIUM SUCC 125 MG IJ SOLR
125.0000 mg | Freq: Four times a day (QID) | INTRAMUSCULAR | Status: DC
  Administered 2011-11-27 (×2): 125 mg via INTRAVENOUS
  Filled 2011-11-26 (×3): qty 2

## 2011-11-26 MED ORDER — ACETAMINOPHEN 325 MG PO TABS: 650.0000 mg | ORAL_TABLET | Freq: Four times a day (QID) | ORAL | Status: DC | PRN

## 2011-11-26 NOTE — ED Provider Notes (Signed)
History     CSN: [id number]  Arrival date & time 11/26/11  1707   First MD Initiated Contact with Patient 11/26/11 1842      Chief Complaint  Patient presents with  . Wheezing    (Consider location/radiation/quality/duration/timing/severity/associated sxs/prior treatment) HPI Pt presents to department for evaluation of SOB. Pt states chest cold x3 days. Pt states increasing SOB, becomes worse with movement. Expiratory wheezing noted upon arrival to exam room. Denies pain at the present. He is alert and oriented x4. Skin warm and dry.  History reviewed. No pertinent past medical history.  History reviewed. No pertinent past surgical history.  History reviewed. No pertinent family history.  History  Substance Use Topics  . Smoking status: Never Smoker   . Smokeless tobacco: Not on file  . Alcohol Use: No      Review of Systems  All other systems reviewed and are negative.    Allergies  Review of patient's allergies indicates no known allergies.  Home Medications   Current Outpatient Rx  Name Route Sig Dispense Refill  . DESMOPRESSIN ACETATE 0.1 MG PO TABS Oral Take 0.1 mg by mouth daily.      Marland Kitchen GABAPENTIN 300 MG PO CAPS Oral Take 300 mg by mouth 3 (three) times daily.      Marland Kitchen HYDROCODONE-ACETAMINOPHEN 5-325 MG PO TABS Oral Take 1 tablet by mouth every 6 (six) hours as needed. For pain     . OXYBUTYNIN CHLORIDE 5 MG PO TABS Oral Take 5 mg by mouth at bedtime.      . TAMSULOSIN HCL 0.4 MG PO CAPS Oral Take 0.4 mg by mouth daily.      . TELMISARTAN 80 MG PO TABS Oral Take 80 mg by mouth daily.      . TRAMADOL HCL 50 MG PO TABS Oral Take 50 mg by mouth every 6 (six) hours as needed. For pain     . ALBUTEROL SULFATE HFA 108 (90 BASE) MCG/ACT IN AERS Inhalation Inhale 2 puffs into the lungs every 4 (four) hours as needed for wheezing. 1 Inhaler 0  . AZITHROMYCIN 250 MG PO TABS Oral Take 1 tablet (250 mg total) by mouth once. Take last dose January 11 AM. 1 each 0  .  PREDNISONE 20 MG PO TABS Oral Take 1 tablet (20 mg total) by mouth daily with breakfast. Start 1/11. 5 tablet 0    BP 136/87  Pulse 82  Temp(Src) 97.9 F (36.6 C) (Oral)  Resp 20  Ht 5\' 7"  (1.702 m)  Wt 222 lb 12.8 oz (101.061 kg)  BMI 34.90 kg/m2  SpO2 95%  Physical Exam  Nursing note and vitals reviewed. Constitutional: He is oriented to person, place, and time. He appears well-developed and well-nourished. No distress.  HENT:  Head: Normocephalic and atraumatic.  Eyes: Pupils are equal, round, and reactive to light.  Neck: Normal range of motion.  Cardiovascular: Normal rate and intact distal pulses.   Pulmonary/Chest: Accessory muscle usage present. He is in respiratory distress. He has wheezes.  Abdominal: Normal appearance. He exhibits no distension.  Musculoskeletal: Normal range of motion.  Neurological: He is alert and oriented to person, place, and time. No cranial nerve deficit.  Skin: Skin is warm and dry. No rash noted.  Psychiatric: He has a normal mood and affect. His behavior is normal.    ED Course  Procedures (including critical care time)  Labs Reviewed  BASIC METABOLIC PANEL - Abnormal; Notable for the following:    Glucose,  Bld 173 (*)    All other components within normal limits  CBC - Abnormal; Notable for the following:    Hemoglobin 12.9 (*)    HCT 38.2 (*)    All other components within normal limits  RESPIRATORY VIRUS PANEL (18 COMPONENTS) - Abnormal; Notable for the following:    Respiratory Syncytial Virus A DETECTED (*)    All other components within normal limits  CBC  DIFFERENTIAL  ETHANOL  POCT I-STAT, CHEM 8  LAB REPORT - SCANNED   DG Chest 2 View (Final result)   Result time:11/26/11 1901    Final result by Rad Results In Interface (11/26/11 19:01:54)    Narrative:   *RADIOLOGY REPORT*  Clinical Data: Cough, shortness of breath, wheezing.  CHEST - 2 VIEW  Comparison: 12/12/2010.  Findings: Interval mild enlargement of the  cardiac silhouette. Mild diffuse peribronchial thickening and mild prominence of the pulmonary vasculature. Unremarkable bones.  IMPRESSION:  1. Mild bronchitic changes. 2. Interval mild cardiomegaly and mild pulmonary vascular congestion.  Original Report Authenticated By: Darrol Angel, M.D.    No results found.   1. Wheezing   2. Fever       MDM          Nelia Shi, MD 12/08/11 2312

## 2011-11-26 NOTE — ED Notes (Addendum)
Pt presents to department for evaluation of SOB. Pt states chest cold x3 days. Pt states increasing SOB, becomes worse with movement. Expiratory wheezing noted upon arrival to exam room. Denies pain at the present. He is alert and oriented x4. Skin warm and dry.

## 2011-11-26 NOTE — ED Notes (Signed)
The pt has a cold for 3-4 days.  He has a congested cough with wheezing today.  His symptoms are worse today

## 2011-11-26 NOTE — H&P (Addendum)
DATE OF ADMISSION:  11/26/2011  PCP:    Knox Royalty, MD  Chief Complaint:    HPI: [name] is an 61 y.o. male who was brought to the ED for evaluation secondary to complaints of worsening SOB today and loud wheezing and complaints of chest tightness.  He has had wheezing off and on for months, however, patient does not have a previous diagnosis of asthma or COPD, and has never smoked.  He also has had subjective complaints of fevers and chills over the past 2 days.  He reports coughing up a whitish sputum.  He denies myalgias and he also denies chest pain, nausea or vomiting or diarrhea.      Past medical History:        HTN      Traumatic Brain Injury    History reviewed. No pertinent past surgical history.  Medications:  HOME MEDS: Prior to Admission medications   Not on File    Allergies:  No Known Allergies  Social History:   reports that he has never smoked. He does not have any smokeless tobacco history on file. He reports that he does not drink alcohol. His drug history not on file.  Family History: History reviewed. No pertinent family history.  Review of Systems:  The patient denies anorexia, fever, weight loss, vision loss, decreased hearing, syncope, peripheral edema, balance deficits, hemoptysis, abdominal pain, melena, hematochezia, severe indigestion/heartburn, hematuria, incontinence, genital sores, muscle weakness, suspicious skin lesions, transient blindness, difficulty walking, depression, unusual weight change, abnormal bleeding, enlarged lymph nodes, angioedema.   Physical Exam:  GEN:  Pleasant 61 year old well nourished and well developed Caucasian male examined  and in no acute distress; cooperative with exam Filed Vitals:   11/26/11 1721 11/26/11 1918  BP: 129/71 145/79  Pulse: 101 93  Temp: 101.4 F (38.6 C) 99.3 F (37.4 C)  TempSrc: Oral Oral  Resp: 24 26  SpO2: 97% 97%   Blood pressure 145/79, pulse 93, temperature 99.3 F (37.4  C), temperature source Oral, resp. rate 26, SpO2 97.00%. PSYCH: He is alert and oriented x4; does not appear anxious does not appear depressed; affect is normal HEENT: Normocephalic and Atraumatic, Mucous membranes pink; PERRLA; EOM intact; Fundi:  Benign;  No scleral icterus, Nares: Patent, Oropharynx: Clear, Neck:  FROM, no cervical lymphadenopathy nor thyromegaly or carotid bruit; no JVD; Breasts:: Not examined CHEST WALL: No tenderness CHEST: Diffuse Expiratory Wheezes throughout, Decreased Breath sounds.   HEART: Regular rate and rhythm; no murmurs rubs or gallops BACK: No kyphosis or scoliosis; no CVA tenderness ABDOMEN: Positive Bowel Sounds, soft non-tender; no masses, no organomegaly Rectal Exam: Not done EXTREMITIES: no cyanosis, clubbing or 2+ Edema to the PreTibial Areas of BLEs.   Genitalia: not examined PULSES: 2+ and symmetric SKIN: Exzematous changes on face, Normal hydration no rash or ulceration CNS: Cranial nerves 2-12 grossly intact no focal neurologic deficit   Labs & Imaging Results for orders placed during the hospital encounter of 11/26/11 (from the past 48 hour(s))  CBC     Status: Normal   Collection Time   11/26/11  7:14 PM      Component Value Range Comment   WBC 6.9  4.0 - 10.5 (K/uL)    RBC 4.40  4.22 - 5.81 (MIL/uL)    Hemoglobin 13.4  13.0 - 17.0 (g/dL)    HCT 84.1  32.4 - 40.1 (%)    MCV 90.9  78.0 - 100.0 (fL)    MCH 30.5  26.0 - 34.0 (pg)    MCHC 33.5  30.0 - 36.0 (g/dL)    RDW 16.1  09.6 - 04.5 (%)    Platelets 195  150 - 400 (K/uL)   DIFFERENTIAL     Status: Normal   Collection Time   11/26/11  7:14 PM      Component Value Range Comment   Neutrophils Relative 68  43 - 77 (%)    Neutro Abs 4.7  1.7 - 7.7 (K/uL)    Lymphocytes Relative 20  12 - 46 (%)    Lymphs Abs 1.4  0.7 - 4.0 (K/uL)    Monocytes Relative 10  3 - 12 (%)    Monocytes Absolute 0.7  0.1 - 1.0 (K/uL)    Eosinophils Relative 2  0 - 5 (%)    Eosinophils Absolute 0.1  0.0 - 0.7  (K/uL)    Basophils Relative 0  0 - 1 (%)    Basophils Absolute 0.0  0.0 - 0.1 (K/uL)   ETHANOL     Status: Normal   Collection Time   11/26/11  7:14 PM      Component Value Range Comment   Alcohol, Ethyl (B) <11  0 - 11 (mg/dL)   POCT I-STAT, CHEM 8     Status: Normal   Collection Time   11/26/11  7:42 PM      Component Value Range Comment   Sodium 142  135 - 145 (mEq/L)    Potassium 3.8  3.5 - 5.1 (mEq/L)    Chloride 105  96 - 112 (mEq/L)    BUN 13  6 - 23 (mg/dL)    Creatinine, Ser 4.09  0.50 - 1.35 (mg/dL)    Glucose, Bld 98  70 - 99 (mg/dL)    Calcium, Ion 8.11  1.12 - 1.32 (mmol/L)    TCO2 25  0 - 100 (mmol/L)    Hemoglobin 13.9  13.0 - 17.0 (g/dL)    HCT 91.4  78.2 - 95.6 (%)    Dg Chest 2 View  11/26/2011  *RADIOLOGY REPORT*  Clinical Data: Cough, shortness of breath, wheezing.  CHEST - 2 VIEW  Comparison: 12/12/2010.  Findings: Interval mild enlargement of the cardiac silhouette. Mild diffuse peribronchial thickening and mild prominence of the pulmonary vasculature.  Unremarkable bones.  IMPRESSION:  1.  Mild bronchitic changes. 2.  Interval mild cardiomegaly and mild pulmonary vascular congestion.  Original Report Authenticated By: Darrol Angel, M.D.      Assessment/Plan:  1.  Asthmatic Bronchitis/Bronchospasm-  Treatment started with IV Steroid taper, Nebs, and IV antibiotics of Rocephin and Azithromycin.  Supplemental O2 PRN.  Evaluate for Cariogenic causes as well.   2.  Possible Influenzae- check Influenzae by Nasal Swab/Culture.   3.  SOB- PRN O2.  4.  Edema-  2 D ECHO with Doppler study to evaluate LV Fxn and EF.   5.  Hx of Traumatic Brain Injury- Stable 6.  Other plans as per orders.       CODE STATUS:      FULL CODE         Estefanie Cornforth C 11/26/2011, 9:14 PM

## 2011-11-27 LAB — BASIC METABOLIC PANEL
BUN: 13 mg/dL (ref 6–23)
Chloride: 104 mEq/L (ref 96–112)
Creatinine, Ser: 0.76 mg/dL (ref 0.50–1.35)
GFR calc Af Amer: >90 mL/min (ref >90)
Glucose, Bld: 173 mg/dL — ABNORMAL HIGH (ref 70–99)

## 2011-11-27 LAB — CBC
HCT: 38.2 % — ABNORMAL LOW (ref 39.0–52.0)
Hemoglobin: 12.9 g/dL — ABNORMAL LOW (ref 13.0–17.0)
MCH: 30.4 pg (ref 26.0–34.0)
MCHC: 33.8 g/dL (ref 30.0–36.0)
MCV: 89.9 fL (ref 78.0–100.0)
RDW: 14 % (ref 11.5–15.5)

## 2011-11-27 MED ORDER — ALBUTEROL SULFATE (5 MG/ML) 0.5% IN NEBU
2.5000 mg | INHALATION_SOLUTION | RESPIRATORY_TRACT | Status: DC
  Administered 2011-11-27 – 2011-11-28 (×6): 2.5 mg via RESPIRATORY_TRACT
  Filled 2011-11-27 (×6): qty 0.5

## 2011-11-27 MED ORDER — INFLUENZA VIRUS VACC SPLIT PF IM SUSP
0.5000 mL | INTRAMUSCULAR | Status: AC
  Administered 2011-11-28: 0.5 mL via INTRAMUSCULAR
  Filled 2011-11-27: qty 0.5

## 2011-11-27 MED ORDER — ALBUTEROL SULFATE (5 MG/ML) 0.5% IN NEBU: 2.5000 mg | INHALATION_SOLUTION | RESPIRATORY_TRACT | Status: DC | PRN

## 2011-11-27 MED ORDER — OXYBUTYNIN CHLORIDE 5 MG PO TABS
5.0000 mg | ORAL_TABLET | Freq: Every day | ORAL | Status: DC
  Administered 2011-11-27 – 2011-11-29 (×3): 5 mg via ORAL
  Filled 2011-11-27 (×4): qty 1

## 2011-11-27 MED ORDER — TAMSULOSIN HCL 0.4 MG PO CAPS
0.4000 mg | ORAL_CAPSULE | Freq: Every day | ORAL | Status: DC
  Administered 2011-11-27 – 2011-11-30 (×4): 0.4 mg via ORAL
  Filled 2011-11-27 (×4): qty 1

## 2011-11-27 MED ORDER — IPRATROPIUM BROMIDE 0.02 % IN SOLN
0.5000 mg | RESPIRATORY_TRACT | Status: DC
  Administered 2011-11-27 – 2011-11-28 (×6): 0.5 mg via RESPIRATORY_TRACT
  Filled 2011-11-27 (×6): qty 2.5

## 2011-11-27 MED ORDER — DEXTROSE 50 % IV SOLN
INTRAVENOUS | Status: AC
  Filled 2011-11-27: qty 50

## 2011-11-27 MED ORDER — DESMOPRESSIN ACETATE 0.1 MG PO TABS
0.1000 mg | ORAL_TABLET | Freq: Every day | ORAL | Status: DC
  Administered 2011-11-27 – 2011-11-30 (×4): 0.1 mg via ORAL
  Filled 2011-11-27 (×5): qty 1

## 2011-11-27 MED ORDER — ALBUTEROL SULFATE (5 MG/ML) 0.5% IN NEBU: 2.5000 mg | INHALATION_SOLUTION | RESPIRATORY_TRACT | Status: DC

## 2011-11-27 MED ORDER — OLMESARTAN MEDOXOMIL 20 MG PO TABS
20.0000 mg | ORAL_TABLET | Freq: Every day | ORAL | Status: DC
  Administered 2011-11-27 – 2011-11-30 (×4): 20 mg via ORAL
  Filled 2011-11-27 (×4): qty 1

## 2011-11-27 MED ORDER — ALBUTEROL SULFATE (5 MG/ML) 0.5% IN NEBU: 2.5000 mg | INHALATION_SOLUTION | Freq: Four times a day (QID) | RESPIRATORY_TRACT | Status: DC

## 2011-11-27 MED ORDER — IPRATROPIUM BROMIDE 0.02 % IN SOLN: 0.5000 mg | RESPIRATORY_TRACT | Status: DC | PRN

## 2011-11-27 MED ORDER — GABAPENTIN 300 MG PO CAPS
300.0000 mg | ORAL_CAPSULE | Freq: Three times a day (TID) | ORAL | Status: DC
  Administered 2011-11-27 – 2011-11-30 (×9): 300 mg via ORAL
  Filled 2011-11-27 (×11): qty 1

## 2011-11-27 MED ORDER — PREDNISONE 20 MG PO TABS
40.0000 mg | ORAL_TABLET | Freq: Every day | ORAL | Status: DC
  Administered 2011-11-28 – 2011-11-29 (×2): 40 mg via ORAL
  Filled 2011-11-27 (×3): qty 2

## 2011-11-27 NOTE — Progress Notes (Signed)
  Echocardiogram 2D Echocardiogram has been performed.  Jorje Guild [facility] 11/27/2011, 10:51 AM

## 2011-11-27 NOTE — Progress Notes (Signed)
Utilization review completed. Dionisio Aragones Watson 11/27/2011 

## 2011-11-27 NOTE — Progress Notes (Signed)
PATIENT DETAILS Name: [name] Age: 61 y.o. Sex: male Date of Birth: 18-Apr-1951 Admit Date: 11/26/2011 PCP:No primary provider on file.  Subjective: Feels significantly better today.  Objective: Vital signs in last 24 hours: Filed Vitals:   11/27/11 0514 11/27/11 0723 11/27/11 1000 11/27/11 1136  BP: 120/71  142/92   Pulse: 75  96   Temp: 98 F (36.7 C)  97.9 F (36.6 C)   TempSrc: Oral  Oral   Resp: 22  20   Weight: 99.338 kg (219 lb)     SpO2: 95% 97% 95% 96%    Weight change:   There is no height on file to calculate BMI.  Intake/Output from previous day:  Intake/Output Summary (Last 24 hours) at 11/27/11 1409 Last data filed at 11/27/11 1100  Gross per 24 hour  Intake 1275.42 ml  Output    475 ml  Net 800.42 ml    PHYSICAL EXAM: Gen Exam: Awake and alert with clear speech.  Neck: Supple, No JVD.   Chest: B/L Clear.   CVS: S1 S2 Regular, no murmurs.  Abdomen: soft, BS +, non tender, non distended.  Extremities: no edema, lower extremities warm to touch. Neurologic: Non Focal.   Skin: No Rash.   Wounds: N/A.    CONSULTS:  none  LAB RESULTS: CBC  Lab 11/27/11 0625 11/26/11 1942 11/26/11 1914  WBC 5.2 -- 6.9  HGB 12.9* 13.9 13.4  HCT 38.2* 41.0 40.0  PLT 182 -- 195  MCV 89.9 -- 90.9  MCH 30.4 -- 30.5  MCHC 33.8 -- 33.5  RDW 14.0 -- 14.4  LYMPHSABS -- -- 1.4  MONOABS -- -- 0.7  EOSABS -- -- 0.1  BASOSABS -- -- 0.0  BANDABS -- -- --    Chemistries   Lab 11/27/11 0625 11/26/11 1942  NA 137 142  K 3.7 3.8  CL 104 105  CO2 22 --  GLUCOSE 173* 98  BUN 13 13  CREATININE 0.76 1.00  CALCIUM 9.2 --  MG -- --    GFR CrCl is unknown because there is no height on file for the current visit.  Coagulation profile No results found for this basename: INR:5,PROTIME:5 in the last 168 hours  Cardiac Enzymes No results found for this basename: CK:3,CKMB:3,TROPONINI:3,MYOGLOBIN:3 in the last 168 hours  No components found with this  basename: POCBNP:3 No results found for this basename: DDIMER:2 in the last 72 hours No results found for this basename: HGBA1C:2 in the last 72 hours No results found for this basename: CHOL:2,HDL:2,LDLCALC:2,TRIG:2,CHOLHDL:2,LDLDIRECT:2 in the last 72 hours No results found for this basename: TSH,T4TOTAL,FREET3,T3FREE,THYROIDAB in the last 72 hours No results found for this basename: VITAMINB12:2,FOLATE:2,FERRITIN:2,TIBC:2,IRON:2,RETICCTPCT:2 in the last 72 hours No results found for this basename: LIPASE:2,AMYLASE:2 in the last 72 hours  Urine Studies No results found for this basename: UACOL:2,UAPR:2,USPG:2,UPH:2,UTP:2,UGL:2,UKET:2,UBIL:2,UHGB:2,UNIT:2,UROB:2,ULEU:2,UEPI:2,UWBC:2,URBC:2,UBAC:2,CAST:2,CRYS:2,UCOM:2,BILUA:2 in the last 72 hours  MICROBIOLOGY: No results found for this or any previous visit (from the past 240 hour(s)).  RADIOLOGY STUDIES/RESULTS: Dg Chest 2 View  11/26/2011  *RADIOLOGY REPORT*  Clinical Data: Cough, shortness of breath, wheezing.  CHEST - 2 VIEW  Comparison: 12/12/2010.  Findings: Interval mild enlargement of the cardiac silhouette. Mild diffuse peribronchial thickening and mild prominence of the pulmonary vasculature.  Unremarkable bones.  IMPRESSION:  1.  Mild bronchitic changes. 2.  Interval mild cardiomegaly and mild pulmonary vascular congestion.  Original Report Authenticated By: Darrol Angel, M.D.    MEDICATIONS: Scheduled Meds:   . sodium chloride   Intravenous STAT  .  acetaminophen  650 mg Oral Once  . albuterol  2.5 mg Nebulization Once  . ipratropium  0.5 mg Nebulization Q4H   And  . albuterol  2.5 mg Nebulization Q4H  . albuterol  5 mg Nebulization Once  . azithromycin (ZITHROMAX) 500 MG IVPB  500 mg Intravenous Once  . azithromycin  500 mg Intravenous Q24H  . cefTRIAXone (ROCEPHIN)  IV  1 g Intravenous Once  . cefTRIAXone (ROCEPHIN)  IV  1 g Intravenous Q24H  . desmopressin  0.1 mg Oral Daily  . dextrose      . enoxaparin  40 mg  Subcutaneous Q24H  . gabapentin  300 mg Oral TID  . influenza  inactive virus vaccine  0.5 mL Intramuscular Tomorrow-1000  . ipratropium  0.5 mg Nebulization Once  . methylPREDNISolone (SOLU-MEDROL) injection  125 mg Intravenous STAT  . olmesartan  20 mg Oral Daily  . oseltamivir  75 mg Oral BID  . oxybutynin  5 mg Oral QHS  . predniSONE  40 mg Oral Q breakfast  . Tamsulosin HCl  0.4 mg Oral Daily  . DISCONTD: albumin human  12.5 g Intravenous Once  . DISCONTD: albuterol  2.5 mg Nebulization Once  . DISCONTD: albuterol  2.5 mg Nebulization Q4H  . DISCONTD: albuterol  2.5 mg Nebulization Q6H  . DISCONTD: albuterol  2.5 mg Nebulization Q4H  . DISCONTD: ipratropium  0.5 mg Nebulization Q4H  . DISCONTD: methylPREDNISolone (SOLU-MEDROL) injection  125 mg Intravenous Q6H  . DISCONTD: methylPREDNISolone (SOLU-MEDROL) injection  60 mg Intravenous Q12H   Continuous Infusions:   . DISCONTD: sodium chloride 75 mL/hr at 11/27/11 0020   PRN Meds:.acetaminophen, acetaminophen, albuterol, alum & mag hydroxide-simeth, HYDROmorphone, ondansetron (ZOFRAN) IV, ondansetron, oxyCODONE, zolpidem, DISCONTD: ipratropium  Antibiotics: Anti-infectives     Start     Dose/Rate Route Frequency Ordered Stop   11/27/11 0000   azithromycin (ZITHROMAX) 500 mg in dextrose 5 % 250 mL IVPB        500 mg 250 mL/hr over 60 Minutes Intravenous Every 24 hours 11/26/11 2138     11/26/11 2200   oseltamivir (TAMIFLU) capsule 75 mg        75 mg Oral 2 times daily 11/26/11 2137 12/01/11 2159   11/26/11 2145   cefTRIAXone (ROCEPHIN) 1 g in dextrose 5 % 50 mL IVPB        1 g 100 mL/hr over 30 Minutes Intravenous Every 24 hours 11/26/11 2137     11/26/11 2100   cefTRIAXone (ROCEPHIN) 1 g in dextrose 5 % 50 mL IVPB        1 g 100 mL/hr over 30 Minutes Intravenous  Once 11/26/11 2053 11/26/11 2148   11/26/11 2100   azithromycin (ZITHROMAX) 500 mg in dextrose 5 % 250 mL IVPB        500 mg 250 mL/hr over 60 Minutes  Intravenous  Once 11/26/11 2053            Assessment/Plan: Patient Active Hospital Problem List: No active hospital problems. 1. Acute bronchitis -Better, change to prednisone. Continue with Rocephin and Zithromax. Continue with empiric Tamiflu as well. -Continue with nebulizes as ordered. -Stop intravenous fluids.  2. Hypertension -Resume home medications. Monitor BP.  3. 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 We'll resume desmopressin.   Disposition: Remain inpatient for another 24-48 hours.  DVT Prophylaxis: Lovenox  Code Status: Full code  Maretta Bees,   MD. 11/27/2011,  2:09 PM

## 2011-11-28 MED ORDER — ALBUTEROL SULFATE HFA 108 (90 BASE) MCG/ACT IN AERS
2.0000 | INHALATION_SPRAY | Freq: Four times a day (QID) | RESPIRATORY_TRACT | Status: DC
  Administered 2011-11-28 – 2011-11-29 (×6): 2 via RESPIRATORY_TRACT
  Filled 2011-11-28: qty 6.7

## 2011-11-28 MED ORDER — IPRATROPIUM BROMIDE HFA 17 MCG/ACT IN AERS
2.0000 | INHALATION_SPRAY | RESPIRATORY_TRACT | Status: DC
  Administered 2011-11-28 – 2011-11-29 (×7): 2 via RESPIRATORY_TRACT
  Filled 2011-11-28: qty 12.9

## 2011-11-28 MED FILL — Perflutren Lipid Microsphere IV Susp 6.52 MG/ML: INTRAVENOUS | Qty: 2 | Status: AC

## 2011-11-28 NOTE — Progress Notes (Signed)
PATIENT DETAILS Name: [name] Age: 61 y.o. Sex: male Date of Birth: 09-03-1951 Admit Date: 11/26/2011 PCP:No primary provider on file.  Subjective: Feels significantly better today-less SOB-still wheezes on ambulation-but maintains O2 sats  Objective: Vital signs in last 24 hours: Filed Vitals:   11/28/11 0448 11/28/11 0814 11/28/11 1222 11/28/11 1617  BP:      Pulse:      Temp:      TempSrc:      Resp:      Height:      Weight:      SpO2: 95% 99% 97% 98%    Weight change: 1.062 kg (2 lb 5.5 oz)  Body mass index is 34.67 kg/(m^2).  Intake/Output from previous day:  Intake/Output Summary (Last 24 hours) at 11/28/11 1714 Last data filed at 11/28/11 1600  Gross per 24 hour  Intake    780 ml  Output   1250 ml  Net   -470 ml    PHYSICAL EXAM: Gen Exam: Awake and alert with clear speech.  Neck: Supple, No JVD.   Chest: B/L Clear, except scattered rhonchi CVS: S1 S2 Regular, no murmurs.  Abdomen: soft, BS +, non tender, non distended.  Extremities: no edema, lower extremities warm to touch. Neurologic: Non Focal.   Skin: No Rash.   Wounds: N/A.    CONSULTS:  none  LAB RESULTS: CBC  Lab 11/27/11 0625 11/26/11 1942 11/26/11 1914  WBC 5.2 -- 6.9  HGB 12.9* 13.9 13.4  HCT 38.2* 41.0 40.0  PLT 182 -- 195  MCV 89.9 -- 90.9  MCH 30.4 -- 30.5  MCHC 33.8 -- 33.5  RDW 14.0 -- 14.4  LYMPHSABS -- -- 1.4  MONOABS -- -- 0.7  EOSABS -- -- 0.1  BASOSABS -- -- 0.0  BANDABS -- -- --    Chemistries   Lab 11/27/11 0625 11/26/11 1942  NA 137 142  K 3.7 3.8  CL 104 105  CO2 22 --  GLUCOSE 173* 98  BUN 13 13  CREATININE 0.76 1.00  CALCIUM 9.2 --  MG -- --    GFR Estimated Creatinine Clearance: 112.2 ml/min (by C-G formula based on Cr of 0.76).  Coagulation profile No results found for this basename: INR:5,PROTIME:5 in the last 168 hours  Cardiac Enzymes No results found for this basename: CK:3,CKMB:3,TROPONINI:3,MYOGLOBIN:3 in the last 168  hours  No components found with this basename: POCBNP:3 No results found for this basename: DDIMER:2 in the last 72 hours No results found for this basename: HGBA1C:2 in the last 72 hours No results found for this basename: CHOL:2,HDL:2,LDLCALC:2,TRIG:2,CHOLHDL:2,LDLDIRECT:2 in the last 72 hours No results found for this basename: TSH,T4TOTAL,FREET3,T3FREE,THYROIDAB in the last 72 hours No results found for this basename: VITAMINB12:2,FOLATE:2,FERRITIN:2,TIBC:2,IRON:2,RETICCTPCT:2 in the last 72 hours No results found for this basename: LIPASE:2,AMYLASE:2 in the last 72 hours  Urine Studies No results found for this basename: UACOL:2,UAPR:2,USPG:2,UPH:2,UTP:2,UGL:2,UKET:2,UBIL:2,UHGB:2,UNIT:2,UROB:2,ULEU:2,UEPI:2,UWBC:2,URBC:2,UBAC:2,CAST:2,CRYS:2,UCOM:2,BILUA:2 in the last 72 hours  MICROBIOLOGY: No results found for this or any previous visit (from the past 240 hour(s)).  RADIOLOGY STUDIES/RESULTS: Dg Chest 2 View  11/26/2011  *RADIOLOGY REPORT*  Clinical Data: Cough, shortness of breath, wheezing.  CHEST - 2 VIEW  Comparison: 12/12/2010.  Findings: Interval mild enlargement of the cardiac silhouette. Mild diffuse peribronchial thickening and mild prominence of the pulmonary vasculature.  Unremarkable bones.  IMPRESSION:  1.  Mild bronchitic changes. 2.  Interval mild cardiomegaly and mild pulmonary vascular congestion.  Original Report Authenticated By: Darrol Angel, M.D.    MEDICATIONS: Scheduled  Meds:    . albuterol  2 puff Inhalation Q6H  . azithromycin (ZITHROMAX) 500 MG IVPB  500 mg Intravenous Once  . azithromycin  500 mg Intravenous Q24H  . cefTRIAXone (ROCEPHIN)  IV  1 g Intravenous Q24H  . desmopressin  0.1 mg Oral Daily  . enoxaparin  40 mg Subcutaneous Q24H  . gabapentin  300 mg Oral TID  . influenza  inactive virus vaccine  0.5 mL Intramuscular Tomorrow-1000  . ipratropium  2 puff Inhalation Q4H  . olmesartan  20 mg Oral Daily  . oseltamivir  75 mg Oral BID  .  oxybutynin  5 mg Oral QHS  . predniSONE  40 mg Oral Q breakfast  . Tamsulosin HCl  0.4 mg Oral Daily  . DISCONTD: albuterol  2.5 mg Nebulization Q4H  . DISCONTD: ipratropium  0.5 mg Nebulization Q4H   Continuous Infusions:  PRN Meds:.acetaminophen, acetaminophen, albuterol, alum & mag hydroxide-simeth, HYDROmorphone, ondansetron (ZOFRAN) IV, ondansetron, oxyCODONE, zolpidem  Antibiotics: Anti-infectives     Start     Dose/Rate Route Frequency Ordered Stop   11/27/11 0000   azithromycin (ZITHROMAX) 500 mg in dextrose 5 % 250 mL IVPB        500 mg 250 mL/hr over 60 Minutes Intravenous Every 24 hours 11/26/11 2138     11/26/11 2200   oseltamivir (TAMIFLU) capsule 75 mg        75 mg Oral 2 times daily 11/26/11 2137 12/01/11 2159   11/26/11 2145   cefTRIAXone (ROCEPHIN) 1 g in dextrose 5 % 50 mL IVPB        1 g 100 mL/hr over 30 Minutes Intravenous Every 24 hours 11/26/11 2137     11/26/11 2100   cefTRIAXone (ROCEPHIN) 1 g in dextrose 5 % 50 mL IVPB        1 g 100 mL/hr over 30 Minutes Intravenous  Once 11/26/11 2053 11/26/11 2148   11/26/11 2100   azithromycin (ZITHROMAX) 500 mg in dextrose 5 % 250 mL IVPB        500 mg 250 mL/hr over 60 Minutes Intravenous  Once 11/26/11 2053            Assessment/Plan: Patient Active Hospital Problem List: No active hospital problems. 1. Acute bronchitis -Better, change to prednisone. Continue with Rocephin and Zithromax. Continue with empiric Tamiflu as well. -Change from Nebs to inhalers-has never used inhalers-has some degree of mental retardation-and lives alone-will ask RN and RT to teach patient how to use inhalers, and if continues to do well-can be discharged home tomorrow.Will keep on IV antibiotics while patient remains inpatient.  2. Hypertension - BP stable, continue Benicar  3. 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 Have resumed  desmopressin-Na-normal  4. 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 She can pick patient up on discharge  Disposition: Remain inpatient for another 24  Hours-will ask Case Management to arrange Home RN on discharge  DVT Prophylaxis: Lovenox  Code Status: Full code  Maretta Bees,   MD. 11/28/2011, 5:14 PM

## 2011-11-28 NOTE — Progress Notes (Signed)
Pt with O2 saturation of 95% on RA at rest. Pt ambulated 100 feet and O2 saturation remains 95% on RA. Notable auditory wheezing. Pt denies shortness of breath during ambulation. Dondra Spry

## 2011-11-29 MED ORDER — ALBUTEROL SULFATE HFA 108 (90 BASE) MCG/ACT IN AERS
2.0000 | INHALATION_SPRAY | Freq: Three times a day (TID) | RESPIRATORY_TRACT | Status: DC
  Administered 2011-11-30: 2 via RESPIRATORY_TRACT
  Filled 2011-11-29: qty 6.7

## 2011-11-29 MED ORDER — PREDNISONE 20 MG PO TABS
20.0000 mg | ORAL_TABLET | Freq: Every day | ORAL | Status: DC
  Administered 2011-11-30: 20 mg via ORAL
  Filled 2011-11-29 (×2): qty 1

## 2011-11-29 MED ORDER — IPRATROPIUM BROMIDE HFA 17 MCG/ACT IN AERS
2.0000 | INHALATION_SPRAY | Freq: Three times a day (TID) | RESPIRATORY_TRACT | Status: DC
  Administered 2011-11-30: 2 via RESPIRATORY_TRACT
  Filled 2011-11-29: qty 12.9

## 2011-11-29 NOTE — Progress Notes (Signed)
PROGRESS NOTE  ASSER Cuevas WGN:[id number] DOB: October 31, 1951 DOA: 11/26/2011 PCP: No primary provider on file.  Interim History: 61 year old man admitted for acute bronchitis. Slowly improving. Anticipate discharge soon. RSV was positive. Subjective: Feels much better.  Objective: Filed Vitals:   11/29/11 1000 11/29/11 1354 11/29/11 1400 11/29/11 1834  BP: 130/88  141/84 114/77  Pulse: 72  88 90  Temp: 99.1 F (37.3 C)  98.3 F (36.8 C) 99.1 F (37.3 C)  TempSrc: Oral  Oral Oral  Resp: 20  20 20   Height:      Weight:      SpO2: 96% 97% 98% 98%    Intake/Output Summary (Last 24 hours) at 11/29/11 1950 Last data filed at 11/29/11 1800  Gross per 24 hour  Intake   1140 ml  Output   1651 ml  Net   -511 ml    Exam:  General: Appears quite calm and comfortable. Cardiovascular: Regular rate and rhythm. No murmur, rub or gallop. Respiratory: Clear to auscultation bilaterally with few wheezes. No rales or rhonchi. Speaks in full sentences. Psychiatric: Speech fluent and clear.  Data Reviewed: Basic Metabolic Panel:  Lab 11/27/11 7846 11/26/11 1942  NA 137 142  K 3.7 3.8  CL 104 105  CO2 22 --  GLUCOSE 173* 98  BUN 13 13  CREATININE 0.76 1.00  CALCIUM 9.2 --  MG -- --  PHOS -- --   CBC:  Lab 11/27/11 0625 11/26/11 1942 11/26/11 1914  WBC 5.2 -- 6.9  NEUTROABS -- -- 4.7  HGB 12.9* 13.9 13.4  HCT 38.2* 41.0 40.0  MCV 89.9 -- 90.9  PLT 182 -- 195    Recent Results (from the past 240 hour(s))  RESPIRATORY VIRUS PANEL (18 COMPONENTS)     Status: Abnormal   Collection Time   11/26/11 10:45 PM      Component Value Range Status Comment   Source - RVPAN NASAL SWAB   Final    Respiratory Syncytial Virus A DETECTED (*)  Final    Respiratory Syncytial Virus B NOT DETECTED   Final    Influenza A NOT DETECTED   Final    Influenza B NOT DETECTED   Final    Parainfluenza 1 NOT DETECTED   Final    Parainfluenza 2 NOT DETECTED   Final    Parainfluenza 3 NOT DETECTED    Final    Parainfluenza 4 NOT DETECTED   Final    Metapneumovirus NOT DETECTED   Final    Coxsackie and Echovirus NOT DETECTED   Final    Rhinovirus NOT DETECTED   Final    Adenovirus B NOT DETECTED   Final    Adenovirus E NOT DETECTED   Final    CoronavirusNL63 NOT DETECTED   Final    CoronavirusHKU1 NOT DETECTED   Final    Coronavirus229E NOT DETECTED   Final    CoronavirusOC43 NOT DETECTED   Final    Bocavirus NOT DETECTED   Final      Studies: Dg Chest 2 View  11/26/2011  *RADIOLOGY REPORT*  Clinical Data: Cough, shortness of breath, wheezing.  CHEST - 2 VIEW  Comparison: 12/12/2010.  Findings: Interval mild enlargement of the cardiac silhouette. Mild diffuse peribronchial thickening and mild prominence of the pulmonary vasculature.  Unremarkable bones.  IMPRESSION:  1.  Mild bronchitic changes. 2.  Interval mild cardiomegaly and mild pulmonary vascular congestion.  Original Report Authenticated By: Darrol Angel, M.D.    Scheduled Meds:   .  albuterol  2 puff Inhalation Q6H  . azithromycin (ZITHROMAX) 500 MG IVPB  500 mg Intravenous Once  . azithromycin  500 mg Intravenous Q24H  . cefTRIAXone (ROCEPHIN)  IV  1 g Intravenous Q24H  . desmopressin  0.1 mg Oral Daily  . enoxaparin  40 mg Subcutaneous Q24H  . gabapentin  300 mg Oral TID  . ipratropium  2 puff Inhalation Q4H  . olmesartan  20 mg Oral Daily  . oseltamivir  75 mg Oral BID  . oxybutynin  5 mg Oral QHS  . predniSONE  40 mg Oral Q breakfast  . Tamsulosin HCl  0.4 mg Oral Daily   Continuous Infusions:    Assessment/Plan: 1. Acute bronchitis: Continues to improve. Wean prednisone. Change to oral antibiotics January 10. Inhalers as needed. 2. Hypertension: Stable. 3. Mental retardation: Lives alone.  Home health RN on discharge.   Code Status: Full code.   Disposition Plan: Home January 10.   Brendia Sacks, MD  [facility] Pager [phone] 11/29/2011, 7:50 PM    LOS: 3 days

## 2011-11-29 NOTE — Progress Notes (Signed)
   CARE MANAGEMENT NOTE 11/29/2011  Patient:  [name], [name]   Account Number:  [id number]  Date Initiated:  11/29/2011  Documentation initiated by:  [facility]  Subjective/Objective Assessment:   Admitted with acute bronchitis. Lives alone, is a ward of the state. Guardian is Sander Radon [phone].     Action/Plan:   Anticipated DC Date:  11/29/2011   Anticipated DC Plan:  HOME W HOME HEALTH SERVICES      DC Planning Services  CM consult      Choice offered to / List presented to:  C-2 HC POA / Guardian        HH arranged  HH-1 RN      [facility] agency  [facility]   Status of service:  Completed, signed off Medicare Important Message given?   (If response is "NO", the following Medicare IM given date fields will be blank) Date Medicare IM given:   Date Additional Medicare IM given:    Discharge Disposition:  HOME W HOME HEALTH SERVICES  Per UR Regulation:  Reviewed for med. necessity/level of care/duration of stay  Comments:  PCP Dr. Knox Royalty  11/29/11 Spoke with patient's guardian Sander Radon regarding [facility] for [facility] to teach patient how to use an inhaler. She stated that patient had had HC with [facility] in the past and that she wished to have [facility]. Contacted Marie at [facility] and set up [facility] for medication teaching. Jacquelynn Cree RN, BSN, CCM

## 2011-11-30 MED ORDER — INFLUENZA VIRUS VACC SPLIT PF IM SUSP
0.5000 mL | INTRAMUSCULAR | Status: DC
  Filled 2011-11-30: qty 0.5

## 2011-11-30 MED ORDER — ALBUTEROL SULFATE HFA 108 (90 BASE) MCG/ACT IN AERS: 2.0000 | INHALATION_SPRAY | RESPIRATORY_TRACT | Status: DC | PRN

## 2011-11-30 MED ORDER — PREDNISONE 20 MG PO TABS: 20.0000 mg | ORAL_TABLET | Freq: Every day | ORAL | Status: AC

## 2011-11-30 MED ORDER — PREDNISONE 20 MG PO TABS: 20.0000 mg | ORAL_TABLET | Freq: Every day | ORAL | Status: DC

## 2011-11-30 MED ORDER — AZITHROMYCIN 250 MG PO TABS: 250.0000 mg | ORAL_TABLET | Freq: Once | ORAL | Status: DC

## 2011-11-30 NOTE — Progress Notes (Signed)
11/30/11 nsg 1439 Pt received flu shot during this admission 11/28/11. Pt to be discharged to home Konrad Dolores RN

## 2011-11-30 NOTE — Discharge Summary (Signed)
Physician Discharge Summary  CURRY SEEFELDT WUJ:[id number] DOB: [birth year] DOA: 11/26/2011  PCP: Paulino Rily, MD  Admit date: 11/26/2011 Discharge date: 11/30/2011  Discharge Diagnoses:  1. Acute bronchitis  Discharge Condition: Improved  Disposition: Home  History of present illness:  61 year old man presented with wheezing and shortness of breath.  Hospital Course:  Mr. Quevedo was admitted to the medical floor and treated for bronchitis with antibiotics as well as inhalers and steroids. RSV screen was positive. His condition rapidly improved. He requires no oxygen. Ambulates well. He is now stable for discharge. He will complete antibiotics tomorrow as an outpatient. He will be on steroids for the next several days. Inhaler as needed. Because of mental retardation he has been set up with home health RN for medication assistance. Chronic issues have remained stable.  Home health RN on discharge for medication assistance.  Discharge Instructions  Discharge Orders    Future Orders Please Complete By Expires   Diet general      Increase activity slowly        Current Discharge Medication List    START taking these medications   Details  albuterol (PROVENTIL HFA;VENTOLIN HFA) 108 (90 BASE) MCG/ACT inhaler Inhale 2 puffs into the lungs every 4 (four) hours as needed for wheezing. Qty: 1 Inhaler, Refills: 0    azithromycin (ZITHROMAX) 250 MG tablet Take 1 tablet (250 mg total) by mouth once. Take last dose January 11 AM. Qty: 1 each, Refills: 0    predniSONE (DELTASONE) 20 MG tablet Take 1 tablet (20 mg total) by mouth daily with breakfast. Start 1/11. Qty: 5 tablet, Refills: 0      CONTINUE these medications which have NOT CHANGED   Details  desmopressin (DDAVP) 0.1 MG tablet Take 0.1 mg by mouth daily.      gabapentin (NEURONTIN) 300 MG capsule Take 300 mg by mouth 3 (three) times daily.      HYDROcodone-acetaminophen (NORCO) 5-325 MG per tablet Take 1 tablet by mouth  every 6 (six) hours as needed. For pain     oxybutynin (DITROPAN) 5 MG tablet Take 5 mg by mouth at bedtime.      Tamsulosin HCl (FLOMAX) 0.4 MG CAPS Take 0.4 mg by mouth daily.      telmisartan (MICARDIS) 80 MG tablet Take 80 mg by mouth daily.      traMADol (ULTRAM) 50 MG tablet Take 50 mg by mouth every 6 (six) hours as needed. For pain        Follow-up Information    Follow up with JONES,ENRICO G in 1 week.   Contact information:   [address] [facility] [place] [address] [phone]           The results of significant diagnostics from this hospitalization (including imaging, microbiology, ancillary and laboratory) are listed below for reference.    Significant Diagnostic Studies: Dg Chest 2 View  11/26/2011  *RADIOLOGY REPORT*  Clinical Data: Cough, shortness of breath, wheezing.  CHEST - 2 VIEW  Comparison: 12/12/2010.  Findings: Interval mild enlargement of the cardiac silhouette. Mild diffuse peribronchial thickening and mild prominence of the pulmonary vasculature.  Unremarkable bones.  IMPRESSION:  1.  Mild bronchitic changes. 2.  Interval mild cardiomegaly and mild pulmonary vascular congestion.  Original Report Authenticated By: Darrol Angel, M.D.    Microbiology: Recent Results (from the past 240 hour(s))  RESPIRATORY VIRUS PANEL (18 COMPONENTS)     Status: Abnormal   Collection Time   11/26/11 10:45 PM  Component Value Range Status Comment   Source - RVPAN NASAL SWAB   Final    Respiratory Syncytial Virus A DETECTED (*)  Final    Respiratory Syncytial Virus B NOT DETECTED   Final    Influenza A NOT DETECTED   Final    Influenza B NOT DETECTED   Final    Parainfluenza 1 NOT DETECTED   Final    Parainfluenza 2 NOT DETECTED   Final    Parainfluenza 3 NOT DETECTED   Final    Parainfluenza 4 NOT DETECTED   Final    Metapneumovirus NOT DETECTED   Final    Coxsackie and Echovirus NOT DETECTED   Final    Rhinovirus NOT DETECTED   Final    Adenovirus  B NOT DETECTED   Final    Adenovirus E NOT DETECTED   Final    CoronavirusNL63 NOT DETECTED   Final    CoronavirusHKU1 NOT DETECTED   Final    Coronavirus229E NOT DETECTED   Final    CoronavirusOC43 NOT DETECTED   Final    Bocavirus NOT DETECTED   Final      Labs: Basic Metabolic Panel:  Lab 11/27/11 6045 11/26/11 1942  NA 137 142  K 3.7 3.8  CL 104 105  CO2 22 --  GLUCOSE 173* 98  BUN 13 13  CREATININE 0.76 1.00  CALCIUM 9.2 --  MG -- --  PHOS -- --   CBC:  Lab 11/27/11 0625 11/26/11 1942 11/26/11 1914  WBC 5.2 -- 6.9  NEUTROABS -- -- 4.7  HGB 12.9* 13.9 13.4  HCT 38.2* 41.0 40.0  MCV 89.9 -- 90.9  PLT 182 -- 195    Time coordinating discharge: 25 minutes.  Signed:  Brendia Sacks, MD  [facility] 11/30/2011, 12:38 PM

## 2011-11-30 NOTE — Progress Notes (Signed)
PROGRESS NOTE  [name] ZOX:[id number] DOB: 17-Nov-1951 DOA: 11/26/2011 PCP: Paulino Rily, MD  Interim History: 61 year old man admitted for acute bronchitis. Slowly improving.  RSV was positive. Subjective: Feels good. No complaints.  Objective: Filed Vitals:   11/29/11 2057 11/29/11 2102 11/30/11 0433 11/30/11 1009  BP:  109/70 108/68 117/82  Pulse: 84 72 64 61  Temp:  98.1 F (36.7 C) 98.3 F (36.8 C) 98.4 F (36.9 C)  TempSrc:  Oral Oral Oral  Resp: 19 18 18 20   Height:      Weight:      SpO2: 97% 99% 96% 96%    Intake/Output Summary (Last 24 hours) at 11/30/11 1225 Last data filed at 11/30/11 1010  Gross per 24 hour  Intake    960 ml  Output   1901 ml  Net   -941 ml    Exam:  General: Appears quite calm and comfortable. Cardiovascular: Regular rate and rhythm. No murmur, rub or gallop. Respiratory: Clear to auscultation bilaterally with few wheezes. No rales or rhonchi. Speaks in full sentences. Psychiatric: Speech fluent and clear.  Data Reviewed: Basic Metabolic Panel:  Lab 11/27/11 8119 11/26/11 1942  NA 137 142  K 3.7 3.8  CL 104 105  CO2 22 --  GLUCOSE 173* 98  BUN 13 13  CREATININE 0.76 1.00  CALCIUM 9.2 --  MG -- --  PHOS -- --   CBC:  Lab 11/27/11 0625 11/26/11 1942 11/26/11 1914  WBC 5.2 -- 6.9  NEUTROABS -- -- 4.7  HGB 12.9* 13.9 13.4  HCT 38.2* 41.0 40.0  MCV 89.9 -- 90.9  PLT 182 -- 195    Recent Results (from the past 240 hour(s))  RESPIRATORY VIRUS PANEL (18 COMPONENTS)     Status: Abnormal   Collection Time   11/26/11 10:45 PM      Component Value Range Status Comment   Source - RVPAN NASAL SWAB   Final    Respiratory Syncytial Virus A DETECTED (*)  Final    Respiratory Syncytial Virus B NOT DETECTED   Final    Influenza A NOT DETECTED   Final    Influenza B NOT DETECTED   Final    Parainfluenza 1 NOT DETECTED   Final    Parainfluenza 2 NOT DETECTED   Final    Parainfluenza 3 NOT DETECTED   Final    Parainfluenza 4  NOT DETECTED   Final    Metapneumovirus NOT DETECTED   Final    Coxsackie and Echovirus NOT DETECTED   Final    Rhinovirus NOT DETECTED   Final    Adenovirus B NOT DETECTED   Final    Adenovirus E NOT DETECTED   Final    CoronavirusNL63 NOT DETECTED   Final    CoronavirusHKU1 NOT DETECTED   Final    Coronavirus229E NOT DETECTED   Final    CoronavirusOC43 NOT DETECTED   Final    Bocavirus NOT DETECTED   Final      Studies: Dg Chest 2 View  11/26/2011  *RADIOLOGY REPORT*  Clinical Data: Cough, shortness of breath, wheezing.  CHEST - 2 VIEW  Comparison: 12/12/2010.  Findings: Interval mild enlargement of the cardiac silhouette. Mild diffuse peribronchial thickening and mild prominence of the pulmonary vasculature.  Unremarkable bones.  IMPRESSION:  1.  Mild bronchitic changes. 2.  Interval mild cardiomegaly and mild pulmonary vascular congestion.  Original Report Authenticated By: Darrol Angel, M.D.    Scheduled Meds:    .  albuterol  2 puff Inhalation TID  . azithromycin (ZITHROMAX) 500 MG IVPB  500 mg Intravenous Once  . azithromycin  500 mg Intravenous Q24H  . cefTRIAXone (ROCEPHIN)  IV  1 g Intravenous Q24H  . desmopressin  0.1 mg Oral Daily  . enoxaparin  40 mg Subcutaneous Q24H  . gabapentin  300 mg Oral TID  . influenza  inactive virus vaccine  0.5 mL Intramuscular Tomorrow-1000  . ipratropium  2 puff Inhalation TID  . olmesartan  20 mg Oral Daily  . oxybutynin  5 mg Oral QHS  . predniSONE  20 mg Oral Q breakfast  . Tamsulosin HCl  0.4 mg Oral Daily  . DISCONTD: albuterol  2 puff Inhalation Q6H  . DISCONTD: ipratropium  2 puff Inhalation Q4H  . DISCONTD: oseltamivir  75 mg Oral BID  . DISCONTD: predniSONE  40 mg Oral Q breakfast   Continuous Infusions:    Assessment/Plan: 1. Acute bronchitis: Continues to improve. Wean prednisone. Finish oral antibiotics. Inhalers needed. 2. Hypertension: Stable. 3. Mental retardation: Lives alone.  Home health RN on  discharge.   Code Status: Full code.   Disposition Plan: Home today.   Brendia Sacks, MD  [facility] Pager [phone] 11/30/2011, 12:25 PM    LOS: 4 days

## 2014-10-08 ENCOUNTER — Encounter (HOSPITAL_COMMUNITY): Payer: Self-pay | Admitting: *Deleted

## 2014-10-08 ENCOUNTER — Emergency Department (HOSPITAL_COMMUNITY): Payer: Medicare Other

## 2014-10-08 ENCOUNTER — Emergency Department (HOSPITAL_COMMUNITY)
Admission: EM | Admit: 2014-10-08 | Discharge: 2014-10-08 | Disposition: A | Discharge status: Home / Self Care | Payer: Medicare Other | Attending: Emergency Medicine | Admitting: Emergency Medicine

## 2014-10-08 DIAGNOSIS — S199XXA Unspecified injury of neck, initial encounter: Secondary | ICD-10-CM | POA: Diagnosis not present

## 2014-10-08 DIAGNOSIS — S6992XA Unspecified injury of left wrist, hand and finger(s), initial encounter: Secondary | ICD-10-CM | POA: Insufficient documentation

## 2014-10-08 DIAGNOSIS — Y92410 Unspecified street and highway as the place of occurrence of the external cause: Secondary | ICD-10-CM | POA: Diagnosis not present

## 2014-10-08 DIAGNOSIS — Y9389 Activity, other specified: Secondary | ICD-10-CM | POA: Insufficient documentation

## 2014-10-08 DIAGNOSIS — Z79899 Other long term (current) drug therapy: Secondary | ICD-10-CM | POA: Insufficient documentation

## 2014-10-08 DIAGNOSIS — S069X9A Unspecified intracranial injury with loss of consciousness of unspecified duration, initial encounter: Secondary | ICD-10-CM | POA: Insufficient documentation

## 2014-10-08 DIAGNOSIS — I10 Essential (primary) hypertension: Secondary | ICD-10-CM | POA: Insufficient documentation

## 2014-10-08 DIAGNOSIS — F79 Unspecified intellectual disabilities: Secondary | ICD-10-CM | POA: Insufficient documentation

## 2014-10-08 DIAGNOSIS — Y998 Other external cause status: Secondary | ICD-10-CM | POA: Insufficient documentation

## 2014-10-08 DIAGNOSIS — R4189 Other symptoms and signs involving cognitive functions and awareness: Secondary | ICD-10-CM | POA: Insufficient documentation

## 2014-10-08 DIAGNOSIS — M79642 Pain in left hand: Secondary | ICD-10-CM

## 2014-10-08 DIAGNOSIS — S069XAA Unspecified intracranial injury with loss of consciousness status unknown, initial encounter: Secondary | ICD-10-CM | POA: Insufficient documentation

## 2014-10-08 MED ORDER — IBUPROFEN 200 MG PO TABS
400.0000 mg | ORAL_TABLET | Freq: Once | ORAL | Status: AC
  Administered 2014-10-08: 400 mg via ORAL
  Filled 2014-10-08: qty 2
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
## 2014-10-08 NOTE — ED Notes (Signed)
Patient with hx of MR and TBI, per EMS Patient at baseline and is in NAD--talking on phone

## 2014-10-08 NOTE — ED Notes (Signed)
MD at bedside. 
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
## 2014-10-08 NOTE — ED Notes (Signed)
Patient initially denies c/o pain, but then states that his neck hurts "just like my friend"

## 2014-11-16 DIAGNOSIS — G629 Polyneuropathy, unspecified: Secondary | ICD-10-CM | POA: Insufficient documentation

## 2015-01-03 ENCOUNTER — Emergency Department (HOSPITAL_COMMUNITY)
Admission: EM | Admit: 2015-01-03 | Discharge: 2015-01-04 | Disposition: A | Discharge status: Home / Self Care | Payer: Medicare Other | Attending: Emergency Medicine | Admitting: Emergency Medicine

## 2015-01-03 ENCOUNTER — Emergency Department (HOSPITAL_COMMUNITY): Payer: Medicare Other

## 2015-01-03 ENCOUNTER — Encounter (HOSPITAL_COMMUNITY): Payer: Self-pay | Admitting: Emergency Medicine

## 2015-01-03 DIAGNOSIS — R062 Wheezing: Secondary | ICD-10-CM

## 2015-01-03 DIAGNOSIS — Z8669 Personal history of other diseases of the nervous system and sense organs: Secondary | ICD-10-CM | POA: Diagnosis not present

## 2015-01-03 DIAGNOSIS — I10 Essential (primary) hypertension: Secondary | ICD-10-CM | POA: Diagnosis not present

## 2015-01-03 DIAGNOSIS — Z87820 Personal history of traumatic brain injury: Secondary | ICD-10-CM | POA: Insufficient documentation

## 2015-01-03 DIAGNOSIS — Z79899 Other long term (current) drug therapy: Secondary | ICD-10-CM | POA: Diagnosis not present

## 2015-01-03 DIAGNOSIS — J9801 Acute bronchospasm: Secondary | ICD-10-CM | POA: Diagnosis not present

## 2015-01-03 DIAGNOSIS — Z86018 Personal history of other benign neoplasm: Secondary | ICD-10-CM | POA: Diagnosis not present

## 2015-01-03 DIAGNOSIS — R0602 Shortness of breath: Secondary | ICD-10-CM | POA: Diagnosis present

## 2015-01-03 DIAGNOSIS — Z7951 Long term (current) use of inhaled steroids: Secondary | ICD-10-CM | POA: Insufficient documentation

## 2015-01-03 HISTORY — DX: Essential (primary) hypertension: I10

## 2015-01-03 HISTORY — DX: Bronchitis, not specified as acute or chronic: J40

## 2015-01-03 HISTORY — DX: Unspecified intracranial injury with loss of consciousness status unknown, initial encounter: S06.9XAA

## 2015-01-03 HISTORY — DX: Unspecified cataract: H26.9

## 2015-01-03 HISTORY — DX: Neoplasm of unspecified behavior of brain: D49.6

## 2015-01-03 HISTORY — DX: Unspecified intracranial injury with loss of consciousness of unspecified duration, initial encounter: S06.9X9A

## 2015-01-03 LAB — BASIC METABOLIC PANEL
ANION GAP: 7 (ref 5–15)
BUN: 15 mg/dL (ref 6–23)
CO2: 23 mmol/L (ref 19–32)
Calcium: 8.5 mg/dL (ref 8.4–10.5)
Chloride: 104 mmol/L (ref 96–112)
Creatinine, Ser: 0.87 mg/dL (ref 0.50–1.35)
GFR calc non Af Amer: >90 mL/min (ref >90)
GLUCOSE: 138 mg/dL — AB (ref 70–99)
POTASSIUM: 3.1 mmol/L — AB (ref 3.5–5.1)
Sodium: 134 mmol/L — ABNORMAL LOW (ref 135–145)

## 2015-01-03 LAB — CBC WITH DIFFERENTIAL/PLATELET
BASOS ABS: 0 10*3/uL (ref 0.0–0.1)
BASOS PCT: 1 % (ref 0–1)
EOS ABS: 0.2 10*3/uL (ref 0.0–0.7)
Eosinophils Relative: 4 % (ref 0–5)
HEMATOCRIT: 40.5 % (ref 39.0–52.0)
Hemoglobin: 13.8 g/dL (ref 13.0–17.0)
LYMPHS ABS: 1.3 10*3/uL (ref 0.7–4.0)
LYMPHS PCT: 25 % (ref 12–46)
MCH: 31.2 pg (ref 26.0–34.0)
MCHC: 34.1 g/dL (ref 30.0–36.0)
MCV: 91.4 fL (ref 78.0–100.0)
MONOS PCT: 5 % (ref 3–12)
Monocytes Absolute: 0.3 10*3/uL (ref 0.1–1.0)
NEUTROS ABS: 3.4 10*3/uL (ref 1.7–7.7)
NEUTROS PCT: 65 % (ref 43–77)
Platelets: 157 10*3/uL (ref 150–400)
RBC: 4.43 MIL/uL (ref 4.22–5.81)
RDW: 13.5 % (ref 11.5–15.5)
WBC: 5.2 10*3/uL (ref 4.0–10.5)

## 2015-01-03 MED ORDER — ALBUTEROL (5 MG/ML) CONTINUOUS INHALATION SOLN
10.0000 mg/h | INHALATION_SOLUTION | Freq: Once | RESPIRATORY_TRACT | Status: AC
  Administered 2015-01-03: 10 mg/h via RESPIRATORY_TRACT
  Filled 2015-01-03: qty 20

## 2015-01-03 MED ORDER — METHYLPREDNISOLONE SODIUM SUCC 125 MG IJ SOLR: 125.0000 mg | Freq: Once | INTRAMUSCULAR | Status: DC

## 2015-01-03 MED ORDER — HYDROCODONE-ACETAMINOPHEN 7.5-325 MG/15ML PO SOLN: 10.0000 mL | Freq: Once | ORAL | Status: DC

## 2015-01-03 MED ORDER — PREDNISONE 20 MG PO TABS: 20.0000 mg | ORAL_TABLET | Freq: Every day | ORAL | Status: DC

## 2015-01-03 NOTE — ED Notes (Signed)
Pt states he is feeling better. No audible wheezing noted.

## 2015-01-03 NOTE — ED Notes (Signed)
Per EMS, patient has had an unproductive cough today and shortness of breath the last two hours. Pt has been given 10mg  of Albuterol, 0.5mg  of Atrovent,  125mg  of Solu-Medrol, and 1000mg  of Tylenol.
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
## 2015-01-03 NOTE — Discharge Instructions (Signed)
Bronchospasm °A bronchospasm is a spasm or tightening of the airways going into the lungs. During a bronchospasm breathing becomes more difficult because the airways get smaller. When this happens there can be coughing, a whistling sound when breathing (wheezing), and difficulty breathing. Bronchospasm is often associated with asthma, but not all patients who experience a bronchospasm have asthma. °CAUSES  °A bronchospasm is caused by inflammation or irritation of the airways. The inflammation or irritation may be triggered by:  °· Allergies (such as to animals, pollen, food, or mold). Allergens that cause bronchospasm may cause wheezing immediately after exposure or many hours later.   °· Infection. Viral infections are believed to be the most common cause of bronchospasm.   °· Exercise.   °· Irritants (such as pollution, cigarette smoke, strong odors, aerosol sprays, and paint fumes).   °· Weather changes. Winds increase molds and pollens in the air. Rain refreshes the air by washing irritants out. Cold air may cause inflammation.   °· Stress and emotional upset.   °SIGNS AND SYMPTOMS  °· Wheezing.   °· Excessive nighttime coughing.   °· Frequent or severe coughing with a simple cold.   °· Chest tightness.   °· Shortness of breath.   °DIAGNOSIS  °Bronchospasm is usually diagnosed through a history and physical exam. Tests, such as chest X-rays, are sometimes done to look for other conditions. °TREATMENT  °· Inhaled medicines can be given to open up your airways and help you breathe. The medicines can be given using either an inhaler or a nebulizer machine. °· Corticosteroid medicines may be given for severe bronchospasm, usually when it is associated with asthma. °HOME CARE INSTRUCTIONS  °· Always have a plan prepared for seeking medical care. Know when to call your health care provider and local emergency services (911 in the [place] Know where you can access local emergency care. °· Only take medicines as  directed by your health care provider. °· If you were prescribed an inhaler or nebulizer machine, ask your health care provider to explain how to use it correctly. Always use a spacer with your inhaler if you were given one. °· It is necessary to remain calm during an attack. Try to relax and breathe more slowly.  °· Control your home environment in the following ways:   °¨ Change your heating and air conditioning filter at least once a month.   °¨ Limit your use of fireplaces and wood stoves. °¨ Do not smoke and do not allow smoking in your home.   °¨ Avoid exposure to perfumes and fragrances.   °¨ Get rid of pests (such as roaches and mice) and their droppings.   °¨ Throw away plants if you see mold on them.   °¨ Keep your house clean and dust free.   °¨ Replace carpet with wood, tile, or vinyl flooring. Carpet can trap dander and dust.   °¨ Use allergy-proof pillows, mattress covers, and box spring covers.   °¨ Wash bed sheets and blankets every week in hot water and dry them in a dryer.   °¨ Use blankets that are made of polyester or cotton.   °¨ Wash hands frequently. °SEEK MEDICAL CARE IF:  °· You have muscle aches.   °· You have chest pain.   °· The sputum changes from clear or white to yellow, green, gray, or bloody.   °· The sputum you cough up gets thicker.   °· There are problems that may be related to the medicine you are given, such as a rash, itching, swelling, or trouble breathing.   °SEEK IMMEDIATE MEDICAL CARE IF:  °· You have worsening wheezing and coughing even   after taking your prescribed medicines.   °· You have increased difficulty breathing.   °· You develop severe chest pain. °MAKE SURE YOU:  °· Understand these instructions. °· Will watch your condition. °· Will get help right away if you are not doing well or get worse. °Document Released: 11/09/2003 Document Revised: 11/11/2013 Document Reviewed: 04/28/2013 °[employer]® Patient Information ©2015 [employer]. This information is not  intended to replace advice given to you by your health care provider. Make sure you discuss any questions you have with your health care provider. ° °
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
## 2015-02-09 ENCOUNTER — Encounter (HOSPITAL_COMMUNITY): Payer: Self-pay | Admitting: Emergency Medicine

## 2015-02-09 ENCOUNTER — Emergency Department (HOSPITAL_COMMUNITY)
Admission: EM | Admit: 2015-02-09 | Discharge: 2015-02-09 | Disposition: A | Discharge status: Home / Self Care | Payer: Medicare Other | Attending: Emergency Medicine | Admitting: Emergency Medicine

## 2015-02-09 DIAGNOSIS — Z8709 Personal history of other diseases of the respiratory system: Secondary | ICD-10-CM | POA: Diagnosis not present

## 2015-02-09 DIAGNOSIS — Z86011 Personal history of benign neoplasm of the brain: Secondary | ICD-10-CM | POA: Insufficient documentation

## 2015-02-09 DIAGNOSIS — I8393 Asymptomatic varicose veins of bilateral lower extremities: Secondary | ICD-10-CM | POA: Insufficient documentation

## 2015-02-09 DIAGNOSIS — Z7952 Long term (current) use of systemic steroids: Secondary | ICD-10-CM | POA: Insufficient documentation

## 2015-02-09 DIAGNOSIS — Z7951 Long term (current) use of inhaled steroids: Secondary | ICD-10-CM | POA: Insufficient documentation

## 2015-02-09 DIAGNOSIS — Z792 Long term (current) use of antibiotics: Secondary | ICD-10-CM | POA: Diagnosis not present

## 2015-02-09 DIAGNOSIS — Z79899 Other long term (current) drug therapy: Secondary | ICD-10-CM | POA: Insufficient documentation

## 2015-02-09 DIAGNOSIS — X58XXXA Exposure to other specified factors, initial encounter: Secondary | ICD-10-CM | POA: Diagnosis not present

## 2015-02-09 DIAGNOSIS — S91012A Laceration without foreign body, left ankle, initial encounter: Secondary | ICD-10-CM | POA: Diagnosis present

## 2015-02-09 DIAGNOSIS — Y9389 Activity, other specified: Secondary | ICD-10-CM | POA: Insufficient documentation

## 2015-02-09 DIAGNOSIS — Y998 Other external cause status: Secondary | ICD-10-CM | POA: Insufficient documentation

## 2015-02-09 DIAGNOSIS — H269 Unspecified cataract: Secondary | ICD-10-CM | POA: Diagnosis not present

## 2015-02-09 DIAGNOSIS — Z87820 Personal history of traumatic brain injury: Secondary | ICD-10-CM | POA: Insufficient documentation

## 2015-02-09 DIAGNOSIS — Z23 Encounter for immunization: Secondary | ICD-10-CM | POA: Insufficient documentation

## 2015-02-09 DIAGNOSIS — Y9289 Other specified places as the place of occurrence of the external cause: Secondary | ICD-10-CM | POA: Diagnosis not present

## 2015-02-09 MED ORDER — TETANUS-DIPHTH-ACELL PERTUSSIS 5-2.5-18.5 LF-MCG/0.5 IM SUSP
0.5000 mL | Freq: Once | INTRAMUSCULAR | Status: AC
  Administered 2015-02-09: 0.5 mL via INTRAMUSCULAR
  Filled 2015-02-09: qty 0.5

## 2015-02-09 MED ORDER — BACITRACIN 500 UNIT/GM EX OINT
1.0000 "application " | TOPICAL_OINTMENT | Freq: Two times a day (BID) | CUTANEOUS | Status: DC
  Administered 2015-02-09: 1 via TOPICAL
  Filled 2015-02-09 (×3): qty 0.9
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
## 2015-02-09 NOTE — ED Notes (Signed)
Pt ambulated without difficulties. Stand by assist, Pt states walks with cane.

## 2015-02-09 NOTE — Discharge Instructions (Signed)
If bleeding recurs: Apply firm pressure for 15 minutes. If this does not stop the bleeding return to the ED.  Wash the affected area with soap and water and apply a thin layer of topical antibiotic ointment. Do this every 12 hours.   Do not use rubbing alcohol or hydrogen peroxide.                        Look for signs of infection: if you see redness, if the area becomes warm, if pain increases sharply, there is discharge (pus), if red streaks appear or you develop fever or vomiting, RETURN immediately to the Emergency Department  for a recheck.   Laceration Care, Adult A laceration is a cut that goes through all layers of the skin. The cut goes into the tissue beneath the skin. HOME CARE For stitches (sutures) or staples:  Keep the cut clean and dry.  If you have a bandage (dressing), change it at least once a day. Change the bandage if it gets wet or dirty, or as told by your doctor.  Wash the cut with soap and water 2 times a day. Rinse the cut with water. Pat it dry with a clean towel.  Put a thin layer of medicated cream on the cut as told by your doctor.  You may shower after the first 24 hours. Do not soak the cut in water until the stitches are removed.  Only take medicines as told by your doctor.  Have your stitches or staples removed as told by your doctor. For skin adhesive strips:  Keep the cut clean and dry.  Do not get the strips wet. You may take a bath, but be careful to keep the cut dry.  If the cut gets wet, pat it dry with a clean towel.  The strips will fall off on their own. Do not remove the strips that are still stuck to the cut. For wound glue:  You may shower or take baths. Do not soak or scrub the cut. Do not swim. Avoid heavy sweating until the glue falls off on its own. After a shower or bath, pat the cut dry with a clean towel.  Do not put medicine on your cut until the glue falls off.  If you have a bandage, do not put tape over the  glue.  Avoid lots of sunlight or tanning lamps until the glue falls off. Put sunscreen on the cut for the first year to reduce your scar.  The glue will fall off on its own. Do not pick at the glue. You may need a tetanus shot if:  You cannot remember when you had your last tetanus shot.  You have never had a tetanus shot. If you need a tetanus shot and you choose not to have one, you may get tetanus. Sickness from tetanus can be serious. GET HELP RIGHT AWAY IF:   Your pain does not get better with medicine.  Your arm, hand, leg, or foot loses feeling (numbness) or changes color.  Your cut is bleeding.  Your joint feels weak, or you cannot use your joint.  You have painful lumps on your body.  Your cut is red, puffy (swollen), or painful.  You have a red line on the skin near the cut.  You have yellowish-white fluid (pus) coming from the cut.  You have a fever.  You have a bad smell coming from the cut or bandage.  Your cut breaks  open before or after stitches are removed.  You notice something coming out of the cut, such as wood or glass.  You cannot move a finger or toe. MAKE SURE YOU:   Understand these instructions.  Will watch your condition.  Will get help right away if you are not doing well or get worse. Document Released: 04/24/2008 Document Revised: 01/29/2012 Document Reviewed: 05/02/2011 [facility] Patient Information 2015 [place], [place]. This information is not intended to replace advice given to you by your health care provider. Make sure you discuss any questions you have with your health care provider.

## 2015-02-09 NOTE — ED Notes (Signed)
Pt was getting out of bathtub, denies injury. Lac to left ankle. Pt has hx of TBI and is at baseline.

## 2015-02-09 NOTE — ED Notes (Signed)
Bed: MB31 Expected date:  Expected time:  Means of arrival:  Comments: EMS- ankle pain, laceration, arterial?

## 2015-05-04 ENCOUNTER — Institutional Professional Consult (permissible substitution): Payer: Medicare Other | Admitting: Internal Medicine

## 2015-05-10 ENCOUNTER — Institutional Professional Consult (permissible substitution): Payer: Medicare Other | Admitting: Internal Medicine

## 2015-05-13 ENCOUNTER — Encounter: Payer: Self-pay | Admitting: Internal Medicine

## 2015-05-13 ENCOUNTER — Ambulatory Visit (INDEPENDENT_AMBULATORY_CARE_PROVIDER_SITE_OTHER): Payer: Medicare Other | Admitting: Internal Medicine

## 2015-05-13 VITALS — BP 132/90 | HR 50 | Ht 67.5 in | Wt 228.0 lb

## 2015-05-13 DIAGNOSIS — R0600 Dyspnea, unspecified: Secondary | ICD-10-CM | POA: Diagnosis not present

## 2015-05-13 MED ORDER — FAMOTIDINE 20 MG PO TABS: ORAL_TABLET | ORAL | Status: DC

## 2015-05-13 MED ORDER — PANTOPRAZOLE SODIUM 40 MG PO TBEC: 40.0000 mg | DELAYED_RELEASE_TABLET | Freq: Every day | ORAL | Status: DC

## 2015-05-13 NOTE — Patient Instructions (Signed)
Stop symbiocort  Pantoprazole (protonix) 40 mg   Take  30-60 min before first meal of the day and Pepcid (famotidine)  20 mg one @  bedtime until return to office - this is the best way to tell whether stomach acid is contributing to your problem.    GERD (REFLUX)  is an extremely common cause of respiratory symptoms just like yours , many times with no obvious heartburn at all.    It can be treated with medication, but also with lifestyle changes including elevation of the head of your bed (ideally with 6 inch  bed blocks),  Smoking cessation, avoidance of late meals, excessive alcohol, and avoid fatty foods, chocolate, peppermint, colas, red wine, and acidic juices such as orange juice.  NO MINT OR MENTHOL PRODUCTS SO NO COUGH DROPS  USE SUGARLESS CANDY INSTEAD (Jolley ranchers or Stover's or Life Savers) or even ice chips will also do - the key is to swallow to prevent all throat clearing. NO OIL BASED VITAMINS - use powdered substitutes.  Please schedule a follow up office visit in 6 weeks, call sooner if needed with full pfts
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
## 2015-05-14 ENCOUNTER — Encounter: Payer: Self-pay | Admitting: Internal Medicine
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
## 2015-07-07 ENCOUNTER — Ambulatory Visit: Payer: Medicare Other | Admitting: Internal Medicine

## 2016-05-17 DIAGNOSIS — J4530 Mild persistent asthma, uncomplicated: Secondary | ICD-10-CM | POA: Insufficient documentation

## 2016-05-18 ENCOUNTER — Encounter: Payer: Self-pay | Admitting: Internal Medicine

## 2016-06-01 ENCOUNTER — Encounter: Payer: Self-pay | Admitting: Vascular Surgery

## 2016-06-06 ENCOUNTER — Encounter: Payer: Medicare Other | Admitting: Vascular Surgery

## 2016-07-12 ENCOUNTER — Encounter: Payer: Self-pay | Admitting: Vascular Surgery

## 2016-07-17 ENCOUNTER — Ambulatory Visit (INDEPENDENT_AMBULATORY_CARE_PROVIDER_SITE_OTHER): Payer: Medicare Other | Admitting: Vascular Surgery

## 2016-07-17 ENCOUNTER — Encounter: Payer: Self-pay | Admitting: Vascular Surgery

## 2016-07-17 VITALS — BP 125/81 | HR 62 | Temp 98.7°F | Resp 18 | Ht 67.5 in | Wt 206.0 lb

## 2016-07-17 DIAGNOSIS — I83891 Varicose veins of right lower extremities with other complications: Secondary | ICD-10-CM
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
## 2016-07-18 ENCOUNTER — Encounter: Payer: Self-pay | Admitting: Nurse Practitioner

## 2016-07-20 ENCOUNTER — Ambulatory Visit: Payer: Medicare Other | Admitting: Internal Medicine

## 2016-11-20 HISTORY — PX: EYE SURGERY: SHX253

## 2016-11-20 HISTORY — PX: CATARACT EXTRACTION: SUR2

## 2016-12-20 IMAGING — DX DG CHEST 1V PORT
1 series · 1 of 1 positions shown · non-contrast
Comparison: November 26, 2011

CLINICAL DATA: Cough and difficulty breathing

EXAM:
PORTABLE CHEST - 1 VIEW

[chest ap]
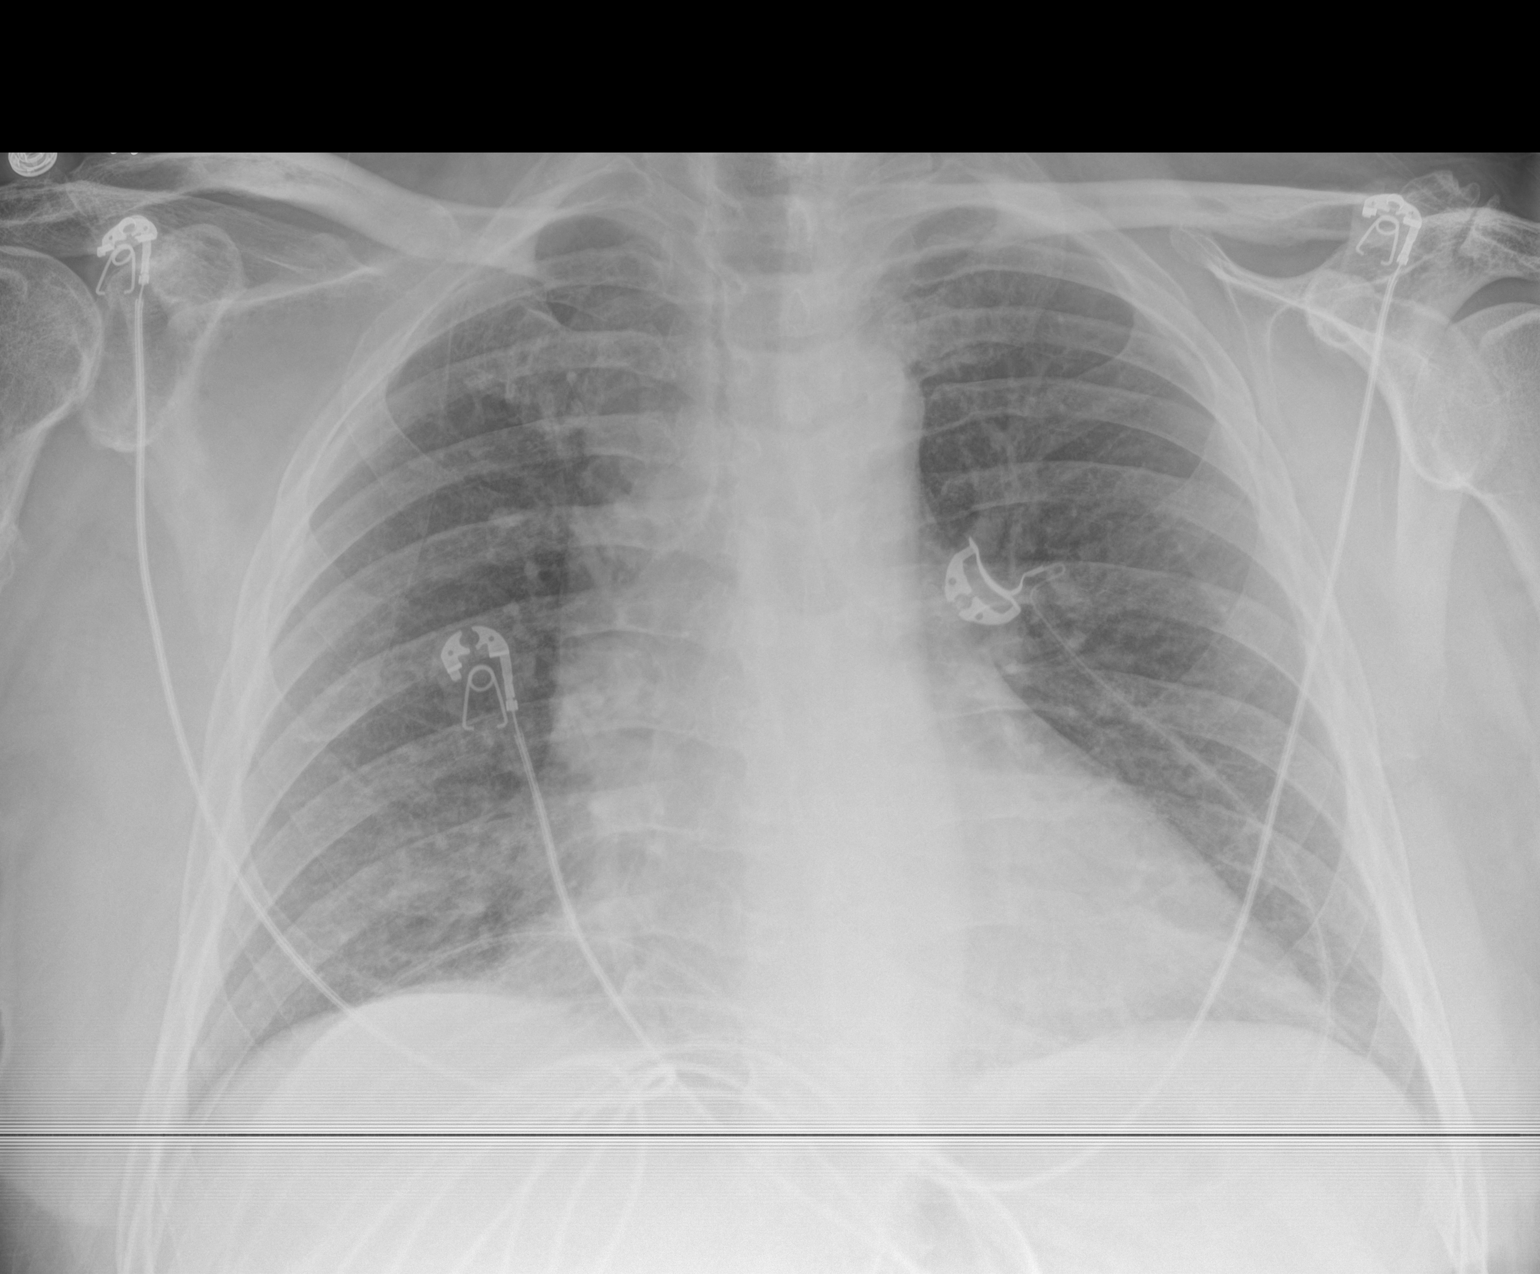

[1 of 1 positions shown; findings below may reference images not displayed]

FINDINGS: There is no edema or consolidation. Heart is mildly enlarged with
pulmonary vascularity within normal limits. No adenopathy. No bone
lesions.
IMPRESSION: No edema or consolidation.  Mild cardiac enlargement.

## 2017-01-04 ENCOUNTER — Ambulatory Visit: Payer: Medicare Other | Admitting: Internal Medicine

## 2018-07-02 ENCOUNTER — Ambulatory Visit
Admission: RE | Admit: 2018-07-02 | Discharge: 2018-07-02 | Disposition: A | Discharge status: Home / Self Care | Payer: Medicaid Other | Source: Ambulatory Visit | Attending: Family Medicine | Admitting: Family Medicine

## 2018-07-02 ENCOUNTER — Other Ambulatory Visit: Payer: Self-pay | Admitting: Family Medicine

## 2018-07-02 ENCOUNTER — Other Ambulatory Visit: Payer: Self-pay | Admitting: Nurse Practitioner

## 2018-07-02 DIAGNOSIS — G8929 Other chronic pain: Secondary | ICD-10-CM

## 2018-07-02 DIAGNOSIS — M79672 Pain in left foot: Principal | ICD-10-CM

## 2018-07-02 DIAGNOSIS — M79671 Pain in right foot: Principal | ICD-10-CM

## 2018-09-18 ENCOUNTER — Other Ambulatory Visit: Payer: Self-pay | Admitting: Gerontology

## 2018-09-18 DIAGNOSIS — M5416 Radiculopathy, lumbar region: Secondary | ICD-10-CM

## 2018-09-30 ENCOUNTER — Other Ambulatory Visit: Payer: Medicaid Other

## 2018-10-03 ENCOUNTER — Ambulatory Visit
Admission: RE | Admit: 2018-10-03 | Discharge: 2018-10-03 | Disposition: A | Discharge status: Home / Self Care | Payer: Medicaid Other | Source: Ambulatory Visit | Attending: Gerontology | Admitting: Gerontology

## 2018-10-03 DIAGNOSIS — M5416 Radiculopathy, lumbar region: Secondary | ICD-10-CM

## 2019-04-03 ENCOUNTER — Other Ambulatory Visit: Payer: Self-pay

## 2019-04-03 ENCOUNTER — Other Ambulatory Visit: Payer: Self-pay | Admitting: Internal Medicine

## 2019-04-03 ENCOUNTER — Ambulatory Visit
Admission: RE | Admit: 2019-04-03 | Discharge: 2019-04-03 | Disposition: A | Discharge status: Home / Self Care | Payer: Medicare (Managed Care) | Source: Ambulatory Visit | Attending: Internal Medicine | Admitting: Internal Medicine

## 2019-04-03 DIAGNOSIS — R109 Unspecified abdominal pain: Secondary | ICD-10-CM

## 2019-10-06 ENCOUNTER — Telehealth (HOSPITAL_COMMUNITY): Payer: Self-pay | Admitting: *Deleted

## 2019-10-06 NOTE — Telephone Encounter (Signed)
Attempted to reach pt to schedule appt. Number on file not working. [facility] and left message with scheduler to see if we can arrange for him.

## 2019-10-27 ENCOUNTER — Other Ambulatory Visit: Payer: Self-pay

## 2019-10-27 ENCOUNTER — Other Ambulatory Visit (HOSPITAL_COMMUNITY): Payer: Self-pay | Admitting: Internal Medicine

## 2019-10-27 ENCOUNTER — Ambulatory Visit (HOSPITAL_COMMUNITY)
Admission: RE | Admit: 2019-10-27 | Discharge: 2019-10-27 | Disposition: A | Discharge status: Home / Self Care | Payer: Medicare (Managed Care) | Source: Ambulatory Visit | Attending: Family | Admitting: Family

## 2019-10-27 DIAGNOSIS — I739 Peripheral vascular disease, unspecified: Secondary | ICD-10-CM | POA: Diagnosis not present

## 2019-11-24 ENCOUNTER — Encounter (INDEPENDENT_AMBULATORY_CARE_PROVIDER_SITE_OTHER): Payer: Medicaid Other | Admitting: Ophthalmology
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
## 2019-12-03 ENCOUNTER — Ambulatory Visit (INDEPENDENT_AMBULATORY_CARE_PROVIDER_SITE_OTHER): Payer: Medicare (Managed Care) | Admitting: Ophthalmology

## 2019-12-03 ENCOUNTER — Encounter (INDEPENDENT_AMBULATORY_CARE_PROVIDER_SITE_OTHER): Payer: Self-pay | Admitting: Ophthalmology

## 2019-12-03 DIAGNOSIS — H35033 Hypertensive retinopathy, bilateral: Secondary | ICD-10-CM

## 2019-12-03 DIAGNOSIS — H3581 Retinal edema: Secondary | ICD-10-CM | POA: Diagnosis not present

## 2019-12-03 DIAGNOSIS — I10 Essential (primary) hypertension: Secondary | ICD-10-CM | POA: Diagnosis not present

## 2019-12-03 DIAGNOSIS — H35342 Macular cyst, hole, or pseudohole, left eye: Secondary | ICD-10-CM

## 2019-12-03 DIAGNOSIS — H26493 Other secondary cataract, bilateral: Secondary | ICD-10-CM

## 2019-12-03 DIAGNOSIS — Z961 Presence of intraocular lens: Secondary | ICD-10-CM
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
## 2019-12-31 ENCOUNTER — Encounter (INDEPENDENT_AMBULATORY_CARE_PROVIDER_SITE_OTHER): Payer: Self-pay | Admitting: Ophthalmology

## 2019-12-31 ENCOUNTER — Ambulatory Visit (INDEPENDENT_AMBULATORY_CARE_PROVIDER_SITE_OTHER): Payer: Medicare (Managed Care) | Admitting: Ophthalmology

## 2019-12-31 DIAGNOSIS — H3581 Retinal edema: Secondary | ICD-10-CM | POA: Diagnosis not present

## 2019-12-31 DIAGNOSIS — I10 Essential (primary) hypertension: Secondary | ICD-10-CM | POA: Diagnosis not present

## 2019-12-31 DIAGNOSIS — H35033 Hypertensive retinopathy, bilateral: Secondary | ICD-10-CM

## 2019-12-31 DIAGNOSIS — Z961 Presence of intraocular lens: Secondary | ICD-10-CM

## 2019-12-31 DIAGNOSIS — H35342 Macular cyst, hole, or pseudohole, left eye: Secondary | ICD-10-CM | POA: Diagnosis not present

## 2019-12-31 DIAGNOSIS — H26493 Other secondary cataract, bilateral: Secondary | ICD-10-CM

## 2020-01-03 ENCOUNTER — Encounter (INDEPENDENT_AMBULATORY_CARE_PROVIDER_SITE_OTHER): Payer: Self-pay | Admitting: Ophthalmology

## 2020-01-26 NOTE — Progress Notes (Shared)
?  [facility] Note ? ?01/29/2020 ? ?  ? ?CHIEF COMPLAINT ?Patient presents for No chief complaint on file. ? ? ?HISTORY OF PRESENT ILLNESS: ?[name] is a 69 y.o. male who presents to the clinic today for:  ? ? ? ?Referring physician: ?Kristie Cowman, MD ?[address] ?[place],  [place] [address] ? ?HISTORICAL INFORMATION:  ? ?Selected notes from the MEDICAL RECORD NUMBER ?  ? ?CURRENT MEDICATIONS: ?No current outpatient medications on file. (Ophthalmic Drugs)  ? ?No current facility-administered medications for this visit. (Ophthalmic Drugs)  ? ?Current Outpatient Medications (Other)  ?Medication Sig  ?? acetaminophen (TYLENOL) 500 MG tablet Take 1 to 2 tablets every 6 hours as needed for pain  ?? albuterol (PROVENTIL HFA;VENTOLIN HFA) 108 (90 BASE) MCG/ACT inhaler Inhale 2 puffs into the lungs every 4 (four) hours as needed for wheezing.  ?? desmopressin (DDAVP) 0.1 MG tablet Take 0.1 mg by mouth at bedtime.   ?? desmopressin (DDAVP) 0.1 MG tablet Take 1 tablet by mouth daily  ?? Doxepin HCl (ZONALON) 5 % CREA Apply 5 Bottles topically daily.  ?? famotidine (PEPCID) 20 MG tablet One at bedtime  ?? famotidine (PEPCID) 20 MG tablet Take 1 tablet by mouth at bedtime  ?? HYDROcodone-acetaminophen (NORCO) 7.5-325 MG tablet Take by mouth.  ?? MELOXICAM PO Take 1 tablet by mouth daily.  ?? oxybutynin (DITROPAN XL) 15 MG 24 hr tablet Take 15 mg by mouth at bedtime.  ?? oxybutynin (DITROPAN XL) 15 MG 24 hr tablet TAKE 1 TABLET BY MOUTH AT BEDTIME  ?? pantoprazole (PROTONIX) 40 MG tablet Take 1 tablet (40 mg total) by mouth daily. Take 30-60 min before first meal of the day  ?? pantoprazole (PROTONIX) 40 MG tablet Take one by mouth daily  ?? pregabalin (LYRICA) 50 MG capsule Take 50 mg by mouth 3 (three) times daily.  ?? pregabalin (LYRICA) 50 MG capsule TAKE 1 CAPSULE BY MOUTH 3 TIMES DAILY  ?? tamsulosin (FLOMAX) 0.4 MG CAPS capsule Take 1 capsule by mouth daily  ? ? Tamsulosin HCl (FLOMAX) 0.4 MG CAPS Take 0.4 mg by mouth daily.    ?? telmisa

## 2020-01-28 ENCOUNTER — Encounter (INDEPENDENT_AMBULATORY_CARE_PROVIDER_SITE_OTHER): Payer: Medicare (Managed Care) | Admitting: Ophthalmology
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
## 2020-01-29 ENCOUNTER — Encounter (INDEPENDENT_AMBULATORY_CARE_PROVIDER_SITE_OTHER): Payer: Medicare (Managed Care) | Admitting: Ophthalmology

## 2020-02-02 ENCOUNTER — Encounter (INDEPENDENT_AMBULATORY_CARE_PROVIDER_SITE_OTHER): Payer: Self-pay | Admitting: Ophthalmology

## 2020-02-02 ENCOUNTER — Ambulatory Visit (INDEPENDENT_AMBULATORY_CARE_PROVIDER_SITE_OTHER): Payer: Medicare (Managed Care) | Admitting: Ophthalmology

## 2020-02-02 DIAGNOSIS — H3581 Retinal edema: Secondary | ICD-10-CM | POA: Diagnosis not present

## 2020-02-02 DIAGNOSIS — H35342 Macular cyst, hole, or pseudohole, left eye: Secondary | ICD-10-CM

## 2020-02-02 DIAGNOSIS — H35033 Hypertensive retinopathy, bilateral: Secondary | ICD-10-CM

## 2020-02-02 DIAGNOSIS — I10 Essential (primary) hypertension: Secondary | ICD-10-CM | POA: Diagnosis not present

## 2020-02-02 DIAGNOSIS — H26493 Other secondary cataract, bilateral: Secondary | ICD-10-CM

## 2020-02-02 DIAGNOSIS — Z961 Presence of intraocular lens: Secondary | ICD-10-CM
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
## 2020-03-15 ENCOUNTER — Encounter (INDEPENDENT_AMBULATORY_CARE_PROVIDER_SITE_OTHER): Payer: Self-pay | Admitting: Ophthalmology

## 2020-03-15 ENCOUNTER — Ambulatory Visit (INDEPENDENT_AMBULATORY_CARE_PROVIDER_SITE_OTHER): Payer: Medicare (Managed Care) | Admitting: Ophthalmology

## 2020-03-15 ENCOUNTER — Other Ambulatory Visit: Payer: Self-pay

## 2020-03-15 DIAGNOSIS — H26493 Other secondary cataract, bilateral: Secondary | ICD-10-CM

## 2020-03-15 DIAGNOSIS — H35033 Hypertensive retinopathy, bilateral: Secondary | ICD-10-CM

## 2020-03-15 DIAGNOSIS — H3581 Retinal edema: Secondary | ICD-10-CM

## 2020-03-15 DIAGNOSIS — H35342 Macular cyst, hole, or pseudohole, left eye: Secondary | ICD-10-CM

## 2020-03-15 DIAGNOSIS — Z961 Presence of intraocular lens: Secondary | ICD-10-CM

## 2020-03-15 DIAGNOSIS — I10 Essential (primary) hypertension: Secondary | ICD-10-CM | POA: Diagnosis not present

## 2020-08-27 ENCOUNTER — Other Ambulatory Visit: Payer: Self-pay | Admitting: Internal Medicine

## 2020-08-27 DIAGNOSIS — M25511 Pain in right shoulder: Secondary | ICD-10-CM

## 2020-09-09 ENCOUNTER — Other Ambulatory Visit: Payer: Self-pay | Admitting: Internal Medicine

## 2020-09-09 ENCOUNTER — Ambulatory Visit
Admission: RE | Admit: 2020-09-09 | Discharge: 2020-09-09 | Disposition: A | Discharge status: Home / Self Care | Payer: No Typology Code available for payment source | Source: Ambulatory Visit | Attending: Internal Medicine | Admitting: Internal Medicine

## 2020-09-09 DIAGNOSIS — M25511 Pain in right shoulder: Secondary | ICD-10-CM

## 2020-09-23 ENCOUNTER — Other Ambulatory Visit: Payer: Medicare (Managed Care)

## 2020-09-28 ENCOUNTER — Ambulatory Visit
Admission: RE | Admit: 2020-09-28 | Discharge: 2020-09-28 | Disposition: A | Discharge status: Home / Self Care | Payer: Medicare (Managed Care) | Source: Ambulatory Visit | Attending: Internal Medicine | Admitting: Internal Medicine

## 2020-09-28 ENCOUNTER — Other Ambulatory Visit: Payer: Self-pay

## 2020-09-28 DIAGNOSIS — M25511 Pain in right shoulder: Secondary | ICD-10-CM

## 2020-11-18 ENCOUNTER — Ambulatory Visit
Admission: RE | Admit: 2020-11-18 | Discharge: 2020-11-18 | Disposition: A | Discharge status: Home / Self Care | Payer: Medicare (Managed Care) | Source: Ambulatory Visit | Attending: Internal Medicine | Admitting: Internal Medicine

## 2020-11-18 ENCOUNTER — Other Ambulatory Visit: Payer: Self-pay | Admitting: Internal Medicine

## 2020-11-18 ENCOUNTER — Other Ambulatory Visit: Payer: Self-pay

## 2020-11-18 DIAGNOSIS — S81801A Unspecified open wound, right lower leg, initial encounter: Secondary | ICD-10-CM

## 2020-11-30 ENCOUNTER — Other Ambulatory Visit: Payer: Self-pay

## 2020-11-30 ENCOUNTER — Inpatient Hospital Stay (HOSPITAL_COMMUNITY)
Admission: EM | Admit: 2020-11-30 | Discharge: 2020-12-02 | DRG: 603 | Disposition: A | Discharge status: Home / Self Care | Payer: Medicare (Managed Care) | Attending: Internal Medicine | Admitting: Internal Medicine

## 2020-11-30 ENCOUNTER — Emergency Department (HOSPITAL_COMMUNITY): Payer: Medicare (Managed Care)

## 2020-11-30 DIAGNOSIS — L089 Local infection of the skin and subcutaneous tissue, unspecified: Secondary | ICD-10-CM | POA: Diagnosis present

## 2020-11-30 DIAGNOSIS — I10 Essential (primary) hypertension: Secondary | ICD-10-CM | POA: Diagnosis present

## 2020-11-30 DIAGNOSIS — E669 Obesity, unspecified: Secondary | ICD-10-CM | POA: Diagnosis present

## 2020-11-30 DIAGNOSIS — W19XXXA Unspecified fall, initial encounter: Secondary | ICD-10-CM | POA: Diagnosis present

## 2020-11-30 DIAGNOSIS — Z833 Family history of diabetes mellitus: Secondary | ICD-10-CM

## 2020-11-30 DIAGNOSIS — I739 Peripheral vascular disease, unspecified: Secondary | ICD-10-CM | POA: Diagnosis present

## 2020-11-30 DIAGNOSIS — Z6826 Body mass index (BMI) 26.0-26.9, adult: Secondary | ICD-10-CM

## 2020-11-30 DIAGNOSIS — T148XXA Other injury of unspecified body region, initial encounter: Secondary | ICD-10-CM | POA: Diagnosis present

## 2020-11-30 DIAGNOSIS — J45909 Unspecified asthma, uncomplicated: Secondary | ICD-10-CM | POA: Diagnosis present

## 2020-11-30 DIAGNOSIS — Z20822 Contact with and (suspected) exposure to covid-19: Secondary | ICD-10-CM | POA: Diagnosis present

## 2020-11-30 DIAGNOSIS — L03115 Cellulitis of right lower limb: Principal | ICD-10-CM | POA: Diagnosis present

## 2020-11-30 DIAGNOSIS — K219 Gastro-esophageal reflux disease without esophagitis: Secondary | ICD-10-CM | POA: Diagnosis present

## 2020-11-30 DIAGNOSIS — L8990 Pressure ulcer of unspecified site, unspecified stage: Secondary | ICD-10-CM | POA: Insufficient documentation

## 2020-11-30 DIAGNOSIS — B965 Pseudomonas (aeruginosa) (mallei) (pseudomallei) as the cause of diseases classified elsewhere: Secondary | ICD-10-CM | POA: Diagnosis present

## 2020-11-30 LAB — CBC WITH DIFFERENTIAL/PLATELET
Abs Immature Granulocytes: 0.01 10*3/uL (ref 0.00–0.07)
Basophils Absolute: 0.1 10*3/uL (ref 0.0–0.1)
Basophils Relative: 1 %
Eosinophils Absolute: 0.3 10*3/uL (ref 0.0–0.5)
Eosinophils Relative: 4 %
HCT: 33.7 % — ABNORMAL LOW (ref 39.0–52.0)
Hemoglobin: 10.4 g/dL — ABNORMAL LOW (ref 13.0–17.0)
Immature Granulocytes: 0 %
Lymphocytes Relative: 24 %
Lymphs Abs: 1.5 10*3/uL (ref 0.7–4.0)
MCH: 29.7 pg (ref 26.0–34.0)
MCHC: 30.9 g/dL (ref 30.0–36.0)
MCV: 96.3 fL (ref 80.0–100.0)
Monocytes Absolute: 0.6 10*3/uL (ref 0.1–1.0)
Monocytes Relative: 10 %
Neutro Abs: 3.9 10*3/uL (ref 1.7–7.7)
Neutrophils Relative %: 61 %
Platelets: 256 10*3/uL (ref 150–400)
RBC: 3.5 MIL/uL — ABNORMAL LOW (ref 4.22–5.81)
RDW: 13.1 % (ref 11.5–15.5)
WBC: 6.4 10*3/uL (ref 4.0–10.5)
nRBC: 0 % (ref 0.0–0.2)

## 2020-11-30 LAB — COMPREHENSIVE METABOLIC PANEL
ALT: 18 U/L (ref 0–44)
AST: 16 U/L (ref 15–41)
Albumin: 3 g/dL — ABNORMAL LOW (ref 3.5–5.0)
Alkaline Phosphatase: 59 U/L (ref 38–126)
Anion gap: 9 (ref 5–15)
BUN: 12 mg/dL (ref 8–23)
CO2: 25 mmol/L (ref 22–32)
Calcium: 8.5 mg/dL — ABNORMAL LOW (ref 8.9–10.3)
Chloride: 101 mmol/L (ref 98–111)
Creatinine, Ser: 0.86 mg/dL (ref 0.61–1.24)
GFR, Estimated: >60 mL/min (ref >60)
Glucose, Bld: 88 mg/dL (ref 70–99)
Potassium: 3.8 mmol/L (ref 3.5–5.1)
Sodium: 135 mmol/L (ref 135–145)
Total Bilirubin: 0.3 mg/dL (ref 0.3–1.2)
Total Protein: 6.2 g/dL — ABNORMAL LOW (ref 6.5–8.1)

## 2020-11-30 LAB — LACTIC ACID, PLASMA: Lactic Acid, Venous: 0.7 mmol/L (ref 0.5–1.9)

## 2020-11-30 MED ORDER — VANCOMYCIN HCL IN DEXTROSE 1-5 GM/200ML-% IV SOLN
1000.0000 mg | Freq: Two times a day (BID) | INTRAVENOUS | Status: DC
  Administered 2020-12-01 – 2020-12-02 (×4): 1000 mg via INTRAVENOUS
  Filled 2020-11-30 (×5): qty 200

## 2020-11-30 NOTE — ED Provider Notes (Signed)
[facility] EMERGENCY DEPARTMENT Provider Note   CSN: [id number] Arrival date & time: 11/30/20  1733     History Chief Complaint  Patient presents with  . Leg Injury    [name] is a 70 y.o. male.  HPI Patient presents with redness and swelling to right lower leg.  Reportedly had an injury to it.  Patient states injury however was 2 years ago.  States did have a small injury recently.  States it has become more red and swollen over the last 3 weeks.  States that seen his PCP and started on antibiotics.  States is getting worse and has some yellowish drainage.  States he has had some fevers.  States continues to get worse despite being on the antibiotics.  Patient does not know what antibiotic he was on.    Past Medical History:  Diagnosis Date  . Alcohol abuse   . Arthritis   . Asthma   . Brain tumor ([place])   . Bronchitis   . GERD (gastroesophageal reflux disease)   . Hypertension   . Hypertensive retinopathy    OU  . Obesity   . Traumatic brain injury ([place])   . Varicose veins     Patient Active Problem List   Diagnosis Date Noted  . Varicose veins of right lower extremity with complications 22/12/5425  . Dyspnea 05/13/2015  . TBI (traumatic brain injury) ([place]) 10/08/2014  . Mental retardation 10/08/2014  . HTN (hypertension) 10/08/2014    Past Surgical History:  Procedure Laterality Date  . CATARACT EXTRACTION Bilateral 2018   Dr. Herbert Deaner  . EYE SURGERY Bilateral 2018   Cat Sx - Dr. Herbert Deaner  . HEMORRHOID SURGERY         Family History  Problem Relation Age of Onset  . Diabetes Father     Social History   Tobacco Use  . Smoking status: Never Smoker  . Smokeless tobacco: Never Used  Substance Use Topics  . Alcohol use: Yes    Alcohol/week: 0.0 standard drinks    Comment: Usually drinks everyday. 2-3 20oz beers a day. Last drink: Tonight  . Drug use: No    Home Medications Prior to Admission medications   Medication Sig  Start Date End Date Taking? Authorizing Provider  acetaminophen (TYLENOL) 500 MG tablet Take 1 to 2 tablets every 6 hours as needed for pain 06/14/15   [provider]  albuterol (PROVENTIL HFA;VENTOLIN HFA) 108 (90 BASE) MCG/ACT inhaler Inhale 2 puffs into the lungs every 4 (four) hours as needed for wheezing. 11/30/11 01/03/15  Samuella Cota, MD  desmopressin (DDAVP) 0.1 MG tablet Take 0.1 mg by mouth at bedtime.     [provider]  desmopressin (DDAVP) 0.1 MG tablet Take 1 tablet by mouth daily 02/23/16   [provider]  Doxepin HCl (ZONALON) 5 % CREA Apply 5 Bottles topically daily. 09/23/14   [provider]  famotidine (PEPCID) 20 MG tablet One at bedtime 05/13/15   Tanda Rockers, MD  famotidine (PEPCID) 20 MG tablet Take 1 tablet by mouth at bedtime 09/03/15   [provider]  HYDROcodone-acetaminophen ([place]) 7.5-325 MG tablet Take by mouth. 10/30/14   [provider]  MELOXICAM PO Take 1 tablet by mouth daily.    [provider]  oxybutynin (DITROPAN XL) 15 MG 24 hr tablet Take 15 mg by mouth at bedtime.    [provider]  oxybutynin (DITROPAN XL) 15 MG 24 hr tablet TAKE 1  TABLET BY MOUTH AT BEDTIME 07/03/16   [provider]  pantoprazole (PROTONIX) 40 MG tablet Take 1 tablet (40 mg total) by mouth daily. Take 30-60 min before first meal of the day 05/13/15   Tanda Rockers, MD  pantoprazole (PROTONIX) 40 MG tablet Take one by mouth daily 09/03/15   [provider]  pregabalin (LYRICA) 50 MG capsule Take 50 mg by mouth 3 (three) times daily.    [provider]  pregabalin (LYRICA) 50 MG capsule TAKE 1 CAPSULE BY MOUTH 3 TIMES DAILY 04/26/16   [provider]  tamsulosin (FLOMAX) 0.4 MG CAPS capsule Take 1 capsule by mouth daily 02/23/16   [provider]  Tamsulosin HCl (FLOMAX) 0.4 MG CAPS Take 0.4 mg by mouth daily.      [provider]  telmisartan (MICARDIS) 80  MG tablet Take 80 mg by mouth daily.      [provider]  traMADol (ULTRAM) 50 MG tablet TAKE 1 TABLET BY MOUTH EVERY 8 HOURS as needed for pain 03/08/16   [provider]    Allergies    Patient has no known allergies.  Review of Systems   Review of Systems  Constitutional: Positive for fever. Negative for appetite change.  HENT: Negative for congestion.   Respiratory: Negative for shortness of breath.   Cardiovascular: Negative for chest pain.  Gastrointestinal: Negative for abdominal pain.  Genitourinary: Negative for flank pain.  Musculoskeletal: Negative for back pain.  Skin: Positive for color change and wound.  Neurological: Negative for weakness.  Psychiatric/Behavioral: Negative for confusion.    Physical Exam Updated Vital Signs BP 109/71 (BP Location: Left Arm)   Pulse (!) 58   Temp (!) 97.5 F (36.4 C) (Oral)   Resp 18   Ht 5\' 7"  (1.702 m)   Wt 77.1 kg   SpO2 99%   BMI 26.63 kg/m   Physical Exam Vitals and nursing note reviewed.  HENT:     Head: Normocephalic.     Right Ear: External ear normal.     Left Ear: External ear normal.     Mouth/Throat:     Mouth: Mucous membranes are moist.  Eyes:     General: No scleral icterus. Cardiovascular:     Rate and Rhythm: Normal rate and regular rhythm.  Pulmonary:     Breath sounds: No wheezing or rhonchi.  Abdominal:     Tenderness: There is no abdominal tenderness.  Musculoskeletal:     Cervical back: Neck supple.     Comments: Tenderness and swelling to right lower leg.  Appears to have acute on chronic wounds.  Does have erythema and some serosanguineous drainage.  Tenderness without fluctuance.  Only mild warmth.  Some swelling of foot also.  Skin:    Capillary Refill: Capillary refill takes less than 2 seconds.  Neurological:     Mental Status: He is alert.     Comments: Awake and at reported baseline.  Reportedly has history of mental retardation and traumatic brain injury.          ED Results / Procedures / Treatments   Labs (all labs ordered are listed, but only abnormal results are displayed) Labs Reviewed  COMPREHENSIVE METABOLIC PANEL - Abnormal; Notable for the following components:      Result Value   Calcium 8.5 (*)    Total Protein 6.2 (*)    Albumin 3.0 (*)    All other components within normal limits  CBC WITH DIFFERENTIAL/PLATELET - Abnormal;  Notable for the following components:   RBC 3.50 (*)    Hemoglobin 10.4 (*)    HCT 33.7 (*)    All other components within normal limits  SARS CORONAVIRUS 2 (TAT 6-24 HRS)  LACTIC ACID, PLASMA  LACTIC ACID, PLASMA  URINALYSIS, ROUTINE W REFLEX MICROSCOPIC    EKG None  Radiology DG Tibia/Fibula Right  Result Date: 11/30/2020 CLINICAL DATA:  Infection redness swelling drainage to right calf EXAM: RIGHT TIBIA AND FIBULA - 2 VIEW COMPARISON:  None. FINDINGS: No fracture or malalignment. No periostitis or bone destruction. Diffuse soft tissue edema without foreign body or soft tissue gas. Focal skin thickening medial side of the distal lower leg. IMPRESSION: 1. No acute osseous abnormality. 2. Diffuse soft tissue edema. Electronically Signed   By: Donavan Foil M.D.   On: 11/30/2020 18:14    Procedures Procedures (including critical care time)  Medications Ordered in ED Medications - No data to display  ED Course  I have reviewed the triage vital signs and the nursing notes.  Pertinent labs & imaging results that were available during my care of the patient were reviewed by me and considered in my medical decision making (see chart for details).    MDM Rules/Calculators/A&P                          Patient with reported worsening swelling and redness and drainage of right lower leg.  Reported wound.  Has been on antibiotics for about a week now.  Patient does not know what antibiotic it is.  White count reassuring but reportedly having fevers.  At this point it sounds as if it is a failure  of outpatient management.  Will discuss with internal medicine residents since patient has [facility] as their primary. Final Clinical Impression(s) / ED Diagnoses Final diagnoses:  Cellulitis of right lower extremity    Rx / DC Orders ED Discharge Orders    None       Davonna Belling, MD 11/30/20 2303

## 2020-11-30 NOTE — ED Triage Notes (Signed)
Pt from home via EMS with redness, swelling, and drainage to R calf after cutting his leg on prickly ball off of tree 3 weeks ago. Taking abx since Thursday without improvement.

## 2020-11-30 NOTE — Progress Notes (Addendum)
Pharmacy Antibiotic Note  DEQUANN VANDERVELDEN is a 70 y.o. male admitted on 11/30/2020 with cellulitis.  Pharmacy has been consulted for vancomycin and cefepime dosing.  Has been on PO ABX PTA but continues to worsen.  Plan: Vancomycin 1000mg  IV Q12H. Goal AUC 400-550.  Expected AUC 520.  SCr used 0.86.  Cefepime 2g IV Q8H.  Height: 5\' 7"  (170.2 cm) Weight: 77.1 kg (170 lb) IBW/kg (Calculated) : 66.1  Temp (24hrs), Avg:97.9 F (36.6 C), Min:97.5 F (36.4 C), Max:98.3 F (36.8 C)  Recent Labs  Lab 11/30/20 1740 11/30/20 1820  WBC  --  6.4  CREATININE  --  0.86  LATICACIDVEN 0.7  --     Estimated Creatinine Clearance: 76.9 mL/min (by C-G formula based on SCr of 0.86 mg/dL).    No Known Allergies   Thank you for allowing pharmacy to be a part of this patients care.  Wynona Neat, PharmD, BCPS  11/30/2020 11:35 PM

## 2020-11-30 NOTE — H&P (Incomplete)
[name] [id number] DOB: 1951/02/01 DOA: 11/30/2020     PCP: Kristie Cowman, MD   Outpatient Specialists: * NONE CARDS: * Dr. NEphrology: *  Dr. NEurology *   Dr. Pulmonary *  Dr.  Oncology * Dr. Fabienne Bruns* Dr.  Sadie Haber, [facility]) Urology Dr. *  Patient arrived to ER on 11/30/20 at 1733 Referred by Attending Davonna Belling, MD   Patient coming from: home Lives alone,   *** With family From facility ***  Chief Complaint: *** Chief Complaint  Patient presents with  . Leg Injury    HPI: [name] is a 70 y.o. male with medical history significant of ***     Presented with   *    Infectious risk factors:  Reports*** fever, shortness of breath, dry cough, chest pain, Sore throat, URI symptoms, anosmia/change in taste, N/V/Diarrhea/abdominal pain,  Body aches, severe fatigue Reports known sick contacts, known COVID 19 exposure, inability to completely isolate   ***KNOWN COVID POSITIVE   Has ***NOt been vaccinated against COVID    Initial COVID TEST  NEGATIVE**** POSITIVE,  ***in house  PCR testing  Pending  No results found for: SARSCOV2NAA   Regarding pertinent Chronic problems: ***  ****Hyperlipidemia - *on statins Lipid Panel  No results found for: CHOL, TRIG, HDL, CHOLHDL, VLDL, LDLCALC, LDLDIRECT, LABVLDL  ***HTN on   ***chronic CHF diastolic/systolic/ combined - last echo***  *** CAD  - On Aspirin, statin, betablocker, Plavix                 - *followed by cardiology                - last cardiac cath  The ASCVD Risk score Mikey Bussing DC Jr., et al., 2013) failed to calculate for the following reasons:   Cannot find a previous HDL lab   Cannot find a previous total cholesterol lab    ***DM 2 - No results found for: HGBA1C ****on insulin, PO meds only, diet controlled  ***Hypothyroidism:  Lab Results  Component Value Date   TSH 1.497 12/09/2010   on synthroid  *** Morbid obesity-   BMI Readings from Last 1 Encounters:  11/30/20 26.63  kg/m     *** Asthma -well *** controlled on home inhalers/ nebs f                        ***last no prior***admission  ***                       No ***history of intubation  *** COPD - not **followed by pulmonology *** not  on baseline oxygen  *L,    *** OSA -on nocturnal oxygen, *CPAP, *noncompliant with CPAP  *** Hx of CVA - *with/out residual deficits on Aspirin 81 mg, 325, Plavix  ***A. Fib -  - CHA2DS2 vas score **** CHA2DS2/VAS Stroke Risk Points      N/A >= 2 Points: High Risk  1 - 1.99 Points: Medium Risk  0 Points: Low Risk    Last Change: N/A      This score determines the patient's risk of having a stroke if the  patient has atrial fibrillation.      This score is not applicable to this patient. Components are not  calculated.     current  on anticoagulation with ****Coumadin  ***Xarelto,* Eliquis,  *** Not on anticoagulation secondary to Risk of Falls, *** recurrent bleeding         -  Rate control:  Currently controlled with ***Toprolol,  *Metoprolol,* Diltiazem, *Coreg          - Rhythm control: *** amiodarone, *flecainide  ***CKD stage III*- baseline Cr **** Estimated Creatinine Clearance: 76.9 mL/min (by C-G formula based on SCr of 0.86 mg/dL).  Lab Results  Component Value Date   CREATININE 0.86 11/30/2020   CREATININE 0.87 01/03/2015   CREATININE 0.76 11/27/2011     **** Liver disease Computed MELD-Na score unavailable. Necessary lab results were not found in the last year. Computed MELD score unavailable. Necessary lab results were not found in the last year.   ***BPH - on Flomax, Proscar    *** Dementia - on Aricept** Nemenda  *** Chronic anemia - baseline hg Hemoglobin & Hematocrit  Recent Labs    11/30/20 1820  HGB 10.4*      While in ER:    ER Provider Called:     DrMarland Kitchen  They Recommend admit to medicine *** Will see in AM*  in ER  Hospitalist was called for admission for ***  The following Work up has been ordered so  far:  Orders Placed This Encounter  Procedures  . SARS CORONAVIRUS 2 (TAT 6-24 HRS) Nasopharyngeal Nasopharyngeal Swab  . DG Tibia/Fibula Right  . Lactic acid, plasma  . Comprehensive metabolic panel  . CBC with Differential  . Urinalysis, Routine w reflex microscopic  . Consult to internal medicine  ALL PATIENTS BEING ADMITTED/HAVING PROCEDURES NEED COVID-19 SCREENING  . vancomycin per pharmacy consult      Following Medications were ordered in ER: Medications - No data to display       Significant initial  Findings: Abnormal Labs Reviewed  COMPREHENSIVE METABOLIC PANEL - Abnormal; Notable for the following components:      Result Value   Calcium 8.5 (*)    Total Protein 6.2 (*)    Albumin 3.0 (*)    All other components within normal limits  CBC WITH DIFFERENTIAL/PLATELET - Abnormal; Notable for the following components:   RBC 3.50 (*)    Hemoglobin 10.4 (*)    HCT 33.7 (*)    All other components within normal limits      Otherwise labs showing:    Recent Labs  Lab 11/30/20 1820  NA 135  K 3.8  CO2 25  GLUCOSE 88  BUN 12  CREATININE 0.86  CALCIUM 8.5*    Cr  * stable,  Up from baseline see below Lab Results  Component Value Date   CREATININE 0.86 11/30/2020   CREATININE 0.87 01/03/2015   CREATININE 0.76 11/27/2011    Recent Labs  Lab 11/30/20 1820  AST 16  ALT 18  ALKPHOS 59  BILITOT 0.3  PROT 6.2*  ALBUMIN 3.0*   Lab Results  Component Value Date   CALCIUM 8.5 (L) 11/30/2020   PHOS 1.8 (L) 12/10/2010          WBC      Component Value Date/Time   WBC 6.4 11/30/2020 1820   LYMPHSABS 1.5 11/30/2020 1820   MONOABS 0.6 11/30/2020 1820   EOSABS 0.3 11/30/2020 1820   BASOSABS 0.1 11/30/2020 1820        Plt: Lab Results  Component Value Date   PLT 256 11/30/2020     Lactic Acid, Venous    Component Value Date/Time   LATICACIDVEN 0.7 11/30/2020 1740   @ (RESUTFAST[lacticacid:4)@  Procalcitonin *** Ordered    COVID-19 Labs  No results for input(s): DDIMER, FERRITIN, LDH, CRP in the  last 72 hours.  No results found for: SARSCOV2NAA    Arterial ***Venous  Blood Gas result:  pH *** pCO2 ***; pO2 ***;     %O2 Sat ***.  ABG    Component Value Date/Time   TCO2 25 11/26/2011 1942      HG/HCT * stable,  Down *Up from baseline see below    Component Value Date/Time   HGB 10.4 (L) 11/30/2020 1820   HCT 33.7 (L) 11/30/2020 1820   MCV 96.3 11/30/2020 1820    No results for input(s): LIPASE, AMYLASE in the last 168 hours. No results for input(s): AMMONIA in the last 168 hours.     Troponin ***ordered Cardiac Panel (last 3 results) No results for input(s): CKTOTAL, CKMB, TROPONINI, RELINDX in the last 72 hours.     ECG: Ordered Personally reviewed by me showing: HR : *** Rhythm: *NSR, Sinus tachycardia * A.fib. W RVR, RBBB, LBBB, Paced Ischemic changes*nonspecific changes, no evidence of ischemic changes QTC*   BNP (last 3 results) No results for input(s): BNP in the last 8760 hours.    DM  labs:  HbA1C: No results for input(s): HGBA1C in the last 8760 hours.     CBG (last 3)  No results for input(s): GLUCAP in the last 72 hours.     UA *** no evidence of UTI  ***Pending ***not ordered   Urine analysis:    Component Value Date/Time   COLORURINE YELLOW 12/09/2010 [place] 12/09/2010 1407   LABSPEC 1.006 12/09/2010 1407   PHURINE 6.5 12/09/2010 1407   HGBUR NEGATIVE 12/09/2010 1407   [place] 12/09/2010 1407   [place] 12/09/2010 1407   PROTEINUR NEGATIVE 12/09/2010 1407   UROBILINOGEN 0.2 12/09/2010 1407   NITRITE NEGATIVE 12/09/2010 1407   LEUKOCYTESUR  12/09/2010 1407    NEGATIVE MICROSCOPIC NOT DONE ON URINES WITH NEGATIVE PROTEIN, BLOOD, LEUKOCYTES, NITRITE, OR GLUCOSE <1000 mg/dL.       Ordered  CT HEAD *** NON acute  CXR - ***NON acute  CTabd/pelvis - ***nonacute  CTA chest - ***nonacute, no PE, * no  evidence of infiltrate      ED Triage Vitals  Enc Vitals Group     BP 11/30/20 1735 109/78     Pulse Rate 11/30/20 1735 (!) 58     Resp 11/30/20 1735 18     Temp 11/30/20 1735 98.3 F (36.8 C)     Temp Source 11/30/20 1735 Oral     SpO2 11/30/20 1735 100 %     Weight 11/30/20 1735 170 lb (77.1 kg)     Height 11/30/20 1735 5\' 7"  (1.702 m)     Head Circumference --      Peak Flow --      Pain Score 11/30/20 1737 8     Pain Loc --      Pain Edu? --      Excl. in [place]? --   TMAX(24)@       Latest  Blood pressure 109/71, pulse (!) 58, temperature (!) 97.5 F (36.4 C), temperature source Oral, resp. rate 18, height 5\' 7"  (1.702 m), weight 77.1 kg, SpO2 99 %.       Review of Systems:    Pertinent positives include: ***  Constitutional:  No weight loss, night sweats, Fevers, chills, fatigue, weight loss  HEENT:  No headaches, Difficulty swallowing,Tooth/dental problems,Sore throat,  No sneezing, itching, ear ache, nasal congestion, post nasal drip,  Cardio-vascular:  No chest pain, Orthopnea, PND,  anasarca, dizziness, palpitations.no Bilateral lower extremity swelling  GI:  No heartburn, indigestion, abdominal pain, nausea, vomiting, diarrhea, change in bowel habits, loss of appetite, melena, blood in stool, hematemesis Resp:  no shortness of breath at rest. No dyspnea on exertion, No excess mucus, no productive cough, No non-productive cough, No coughing up of blood.No change in color of mucus.No wheezing. Skin:  no rash or lesions. No jaundice GU:  no dysuria, change in color of urine, no urgency or frequency. No straining to urinate.  No flank pain.  Musculoskeletal:  No joint pain or no joint swelling. No decreased range of motion. No back pain.  Psych:  No change in mood or affect. No depression or anxiety. No memory loss.  Neuro: no localizing neurological complaints, no tingling, no weakness, no double vision, no gait abnormality, no slurred speech, no  confusion  All systems reviewed and apart from [place] all are negative  Past Medical History:   Past Medical History:  Diagnosis Date  . Alcohol abuse   . Arthritis   . Asthma   . Brain tumor ([place])   . Bronchitis   . GERD (gastroesophageal reflux disease)   . Hypertension   . Hypertensive retinopathy    OU  . Obesity   . Traumatic brain injury ([place])   . Varicose veins       Past Surgical History:  Procedure Laterality Date  . CATARACT EXTRACTION Bilateral 2018   Dr. Herbert Deaner  . EYE SURGERY Bilateral 2018   Cat Sx - Dr. Herbert Deaner  . HEMORRHOID SURGERY      Social History:  Ambulatory *** independently cane, walker  wheelchair bound, bed bound     reports that he has never smoked. He has never used smokeless tobacco. He reports current alcohol use. He reports that he does not use drugs.     Family History: *** Family History  Problem Relation Age of Onset  . Diabetes Father     Allergies: No Known Allergies   Prior to Admission medications   Medication Sig Start Date End Date Taking? Authorizing Provider  acetaminophen (TYLENOL) 500 MG tablet Take 1 to 2 tablets every 6 hours as needed for pain 06/14/15   [provider]  albuterol (PROVENTIL HFA;VENTOLIN HFA) 108 (90 BASE) MCG/ACT inhaler Inhale 2 puffs into the lungs every 4 (four) hours as needed for wheezing. 11/30/11 01/03/15  Samuella Cota, MD  desmopressin (DDAVP) 0.1 MG tablet Take 0.1 mg by mouth at bedtime.     [provider]  desmopressin (DDAVP) 0.1 MG tablet Take 1 tablet by mouth daily 02/23/16   [provider]  Doxepin HCl (ZONALON) 5 % CREA Apply 5 Bottles topically daily. 09/23/14   [provider]  famotidine (PEPCID) 20 MG tablet One at bedtime 05/13/15   Tanda Rockers, MD  famotidine (PEPCID) 20 MG tablet Take 1 tablet by mouth at bedtime 09/03/15   [provider]  HYDROcodone-acetaminophen ([place]) 7.5-325 MG tablet Take by mouth. 10/30/14    [provider]  MELOXICAM PO Take 1 tablet by mouth daily.    [provider]  oxybutynin (DITROPAN XL) 15 MG 24 hr tablet Take 15 mg by mouth at bedtime.    [provider]  oxybutynin (DITROPAN XL) 15 MG 24 hr tablet TAKE 1 TABLET BY MOUTH AT BEDTIME 07/03/16   [provider]  pantoprazole (PROTONIX) 40 MG tablet Take 1 tablet (40 mg total) by mouth daily. Take 30-60 min before first meal  of the day 05/13/15   Tanda Rockers, MD  pantoprazole (PROTONIX) 40 MG tablet Take one by mouth daily 09/03/15   [provider]  pregabalin (LYRICA) 50 MG capsule Take 50 mg by mouth 3 (three) times daily.    [provider]  pregabalin (LYRICA) 50 MG capsule TAKE 1 CAPSULE BY MOUTH 3 TIMES DAILY 04/26/16   [provider]  tamsulosin (FLOMAX) 0.4 MG CAPS capsule Take 1 capsule by mouth daily 02/23/16   [provider]  Tamsulosin HCl (FLOMAX) 0.4 MG CAPS Take 0.4 mg by mouth daily.      [provider]  telmisartan (MICARDIS) 80 MG tablet Take 80 mg by mouth daily.      [provider]  traMADol (ULTRAM) 50 MG tablet TAKE 1 TABLET BY MOUTH EVERY 8 HOURS as needed for pain 03/08/16   [provider]   Physical Exam: Vitals with BMI 11/30/2020 11/30/2020 07/17/2016  Height - 5\' 7"  5' 7.5"  Weight - 170 lbs 206 lbs  BMI - [id number] [id number]  Systolic 0000000 0000000 0000000  Diastolic 71 78 81  Pulse 58 58 62     1. General:  in No ***Acute distress***increased work of breathing ***complaining of severe pain****agitated * Chronically ill *well *cachectic *toxic acutely ill -appearing 2. Psychological: Alert and *** Oriented 3. Head/ENT:   Moist *** Dry Mucous Membranes                          Head Non traumatic, neck supple                          Normal *** Poor Dentition 4. SKIN: normal *** decreased Skin turgor,  Skin clean Dry and intact no rash 5. Heart: Regular rate and rhythm no*** Murmur, no Rub or gallop 6. Lungs:  ***Clear to auscultation bilaterally, no wheezes or crackles   7. Abdomen: Soft, ***non-tender, Non distended *** obese ***bowel sounds present 8. Lower extremities: no clubbing, cyanosis, no ***edema 9. Neurologically Grossly intact, moving all 4 extremities equally *** strength 5 out of 5 in all 4 extremities cranial nerves II through XII intact 10. MSK: Normal range of motion   All other LABS:     Recent Labs  Lab 11/30/20 1820  WBC 6.4  NEUTROABS 3.9  HGB 10.4*  HCT 33.7*  MCV 96.3  PLT 256     Recent Labs  Lab 11/30/20 1820  NA 135  K 3.8  CL 101  CO2 25  GLUCOSE 88  BUN 12  CREATININE 0.86  CALCIUM 8.5*     Recent Labs  Lab 11/30/20 1820  AST 16  ALT 18  ALKPHOS 59  BILITOT 0.3  PROT 6.2*  ALBUMIN 3.0*       Cultures: No results found for: SDES, SPECREQUEST, CULT, REPTSTATUS   Radiological Exams on Admission: DG Tibia/Fibula Right  Result Date: 11/30/2020 CLINICAL DATA:  Infection redness swelling drainage to right calf EXAM: RIGHT TIBIA AND FIBULA - 2 VIEW COMPARISON:  None. FINDINGS: No fracture or malalignment. No periostitis or bone destruction. Diffuse soft tissue edema without foreign body or soft tissue gas. Focal skin thickening medial side of the distal lower leg. IMPRESSION: 1. No acute osseous abnormality. 2. Diffuse soft tissue edema. Electronically Signed   By: Donavan Foil M.D.   On: 11/30/2020 18:14    Chart has been reviewed    Assessment/Plan  ***  Admitted for ***  Present on Admission: **None**   Other plan as per orders.  DVT prophylaxis:  SCD *** Lovenox       Code Status:    Code Status: Prior FULL CODE *** DNR/DNI ***comfort care as per patient ***family  I had personally discussed CODE STATUS with patient and family* I had spent *min discussing goals of care and CODE STATUS    Family Communication:   Family not at  Bedside  plan of care was discussed on the phone with *** Son, Daughter, Wife, Husband,  Sister, Brother , father, mother  Disposition Plan:   *** likely will need placement for rehabilitation                          Back to current facility when stable                            To home once workup is complete and patient is stable  ***Following barriers for discharge:                            Electrolytes corrected                               Anemia corrected                             Pain controlled with PO medications                               Afebrile, white count improving able to transition to PO antibiotics                             Will need to be able to tolerate PO                            Will likely need home health, home O2, set up                           Will need consultants to evaluate patient prior to discharge  ****EXPECT DC tomorrow                    ***Would benefit from PT/OT eval prior to DC  Ordered                   Swallow eval - SLP ordered                   Diabetes care coordinator                   Transition of care consulted                   Nutrition    consulted                  Wound care  consulted                   Palliative care    consulted  Behavioral health  consulted                    Consults called: ***    Admission status:  ED Disposition    None       Obs***  ***  inpatient     I Expect 2 midnight stay secondary to severity of patient's current illness need for inpatient interventions justified by the following: ***hemodynamic instability despite optimal treatment (tachycardia *hypotension * tachypnea *hypoxia, hypercapnia) * Severe lab/radiological/exam abnormalities including:     and extensive comorbidities including: *substance abuse  *Chronic pain *DM2  * CHF * CAD  * COPD/asthma *Morbid Obesity * CKD *dementia *liver disease *history of stroke with residual deficits *  malignancy, * sickle cell disease  . History of amputation . Chronic  anticoagulation  That are currently affecting medical management.   I expect  patient to be hospitalized for 2 midnights requiring inpatient medical care.  Patient is at high risk for adverse outcome (such as loss of life or disability) if not treated.  Indication for inpatient stay as follows:  Severe change from baseline regarding mental status Hemodynamic instability despite maximal medical therapy,  ongoing suicidal ideations,  severe pain requiring acute inpatient management,  inability to maintain oral hydration   persistent chest pain despite medical management Need for operative/procedural  intervention New or worsening hypoxia  Need for IV antibiotics, IV fluids, IV rate controling medications, IV antihypertensives, IV pain medications, IV anticoagulation    Level of care   *** tele  For 12H 24H     medical floor       SDU tele indefinitely please discontinue once patient no longer qualifies COVID-19 Labs    No results found for: SARSCOV2NAA   Precautions: admitted as *** Covid Negative  ***asymptomatic screening protocol****PUI *** covid positive No active isolations ***If Covid PCR is negative  - please DC precautions - would need additional investigation given very high risk for false native test result   PPE: Used by the provider:   N95***P100  eye Goggles,  Gloves ***gown     Critical***  Patient is critically ill due to  hemodynamic instability * respiratory failure *severe sepsis* ongoing chest pain*  They are at high risk for life/limb threatening clinical deterioration requiring frequent reassessment and modifications of care.  Services provided include examination of the patient, review of relevant ancillary tests, prescription of lifesaving therapies, review of medications and prophylactic therapy.  Total critical care time excluding separately billable procedures: 60*  Minutes.    Belita Warsame 11/30/2020, 11:33 PM ***  [facility]      after 2 AM please page floor coverage PA If 7AM-7PM, please contact the day team taking care of the patient using [email or web address]   Patient was evaluated in the context of the global COVID-19 pandemic, which necessitated consideration that the patient might be at risk for infection with the SARS-CoV-2 virus that causes COVID-19. Institutional protocols and algorithms that pertain to the evaluation of patients at risk for COVID-19 are in a state of rapid change based on information released by regulatory bodies including the CDC and federal and state organizations. These policies and algorithms were followed during the patient's care.

## 2020-12-01 ENCOUNTER — Encounter (HOSPITAL_COMMUNITY): Payer: Self-pay | Admitting: Internal Medicine

## 2020-12-01 DIAGNOSIS — S81801A Unspecified open wound, right lower leg, initial encounter: Secondary | ICD-10-CM

## 2020-12-01 DIAGNOSIS — L0390 Cellulitis, unspecified: Secondary | ICD-10-CM | POA: Diagnosis not present

## 2020-12-01 DIAGNOSIS — Z6826 Body mass index (BMI) 26.0-26.9, adult: Secondary | ICD-10-CM | POA: Diagnosis not present

## 2020-12-01 DIAGNOSIS — L03115 Cellulitis of right lower limb: Secondary | ICD-10-CM | POA: Diagnosis present

## 2020-12-01 DIAGNOSIS — L8990 Pressure ulcer of unspecified site, unspecified stage: Secondary | ICD-10-CM | POA: Insufficient documentation

## 2020-12-01 DIAGNOSIS — Z20822 Contact with and (suspected) exposure to covid-19: Secondary | ICD-10-CM | POA: Diagnosis present

## 2020-12-01 DIAGNOSIS — Z833 Family history of diabetes mellitus: Secondary | ICD-10-CM | POA: Diagnosis not present

## 2020-12-01 DIAGNOSIS — I10 Essential (primary) hypertension: Secondary | ICD-10-CM

## 2020-12-01 DIAGNOSIS — K219 Gastro-esophageal reflux disease without esophagitis: Secondary | ICD-10-CM | POA: Diagnosis present

## 2020-12-01 DIAGNOSIS — E669 Obesity, unspecified: Secondary | ICD-10-CM | POA: Diagnosis present

## 2020-12-01 DIAGNOSIS — L089 Local infection of the skin and subcutaneous tissue, unspecified: Secondary | ICD-10-CM | POA: Diagnosis not present

## 2020-12-01 DIAGNOSIS — W19XXXA Unspecified fall, initial encounter: Secondary | ICD-10-CM | POA: Diagnosis present

## 2020-12-01 DIAGNOSIS — B965 Pseudomonas (aeruginosa) (mallei) (pseudomallei) as the cause of diseases classified elsewhere: Secondary | ICD-10-CM | POA: Diagnosis present

## 2020-12-01 DIAGNOSIS — I739 Peripheral vascular disease, unspecified: Secondary | ICD-10-CM | POA: Diagnosis present

## 2020-12-01 DIAGNOSIS — J45909 Unspecified asthma, uncomplicated: Secondary | ICD-10-CM | POA: Diagnosis present

## 2020-12-01 LAB — URINALYSIS, ROUTINE W REFLEX MICROSCOPIC
Bacteria, UA: NONE SEEN
Bilirubin Urine: NEGATIVE
Glucose, UA: NEGATIVE mg/dL
Hgb urine dipstick: NEGATIVE
Ketones, ur: NEGATIVE mg/dL
Nitrite: NEGATIVE
Protein, ur: NEGATIVE mg/dL
Specific Gravity, Urine: 1.028 (ref 1.005–1.030)
pH: 5 (ref 5.0–8.0)

## 2020-12-01 LAB — BASIC METABOLIC PANEL
Anion gap: 7 (ref 5–15)
BUN: 12 mg/dL (ref 8–23)
CO2: 27 mmol/L (ref 22–32)
Calcium: 8.2 mg/dL — ABNORMAL LOW (ref 8.9–10.3)
Chloride: 102 mmol/L (ref 98–111)
Creatinine, Ser: 0.92 mg/dL (ref 0.61–1.24)
GFR, Estimated: >60 mL/min (ref >60)
Glucose, Bld: 126 mg/dL — ABNORMAL HIGH (ref 70–99)
Potassium: 3.9 mmol/L (ref 3.5–5.1)
Sodium: 136 mmol/L (ref 135–145)

## 2020-12-01 LAB — CBC
HCT: 31.9 % — ABNORMAL LOW (ref 39.0–52.0)
Hemoglobin: 9.9 g/dL — ABNORMAL LOW (ref 13.0–17.0)
MCH: 30.3 pg (ref 26.0–34.0)
MCHC: 31 g/dL (ref 30.0–36.0)
MCV: 97.6 fL (ref 80.0–100.0)
Platelets: 229 10*3/uL (ref 150–400)
RBC: 3.27 MIL/uL — ABNORMAL LOW (ref 4.22–5.81)
RDW: 13.1 % (ref 11.5–15.5)
WBC: 5 10*3/uL (ref 4.0–10.5)
nRBC: 0 % (ref 0.0–0.2)

## 2020-12-01 LAB — GLUCOSE, CAPILLARY: Glucose-Capillary: 105 mg/dL — ABNORMAL HIGH (ref 70–99)

## 2020-12-01 LAB — SARS CORONAVIRUS 2 (TAT 6-24 HRS): SARS Coronavirus 2: NEGATIVE

## 2020-12-01 LAB — HIV ANTIBODY (ROUTINE TESTING W REFLEX): HIV Screen 4th Generation wRfx: NONREACTIVE

## 2020-12-01 MED ORDER — TAMSULOSIN HCL 0.4 MG PO CAPS
0.4000 mg | ORAL_CAPSULE | Freq: Every day | ORAL | Status: DC
  Administered 2020-12-02: 0.4 mg via ORAL
  Filled 2020-12-01: qty 1

## 2020-12-01 MED ORDER — ENOXAPARIN SODIUM 40 MG/0.4ML ~~LOC~~ SOLN
40.0000 mg | SUBCUTANEOUS | Status: DC
  Administered 2020-12-01 – 2020-12-02 (×2): 40 mg via SUBCUTANEOUS
  Filled 2020-12-01 (×2): qty 0.4

## 2020-12-01 MED ORDER — SODIUM CHLORIDE 0.9 % IV SOLN
2.0000 g | Freq: Three times a day (TID) | INTRAVENOUS | Status: DC
  Administered 2020-12-01 – 2020-12-02 (×4): 2 g via INTRAVENOUS
  Filled 2020-12-01 (×4): qty 2

## 2020-12-01 MED ORDER — SILVER SULFADIAZINE 1 % EX CREA
TOPICAL_CREAM | Freq: Two times a day (BID) | CUTANEOUS | Status: DC
  Filled 2020-12-01: qty 85

## 2020-12-01 MED ORDER — OXYCODONE HCL 5 MG PO TABS
5.0000 mg | ORAL_TABLET | Freq: Three times a day (TID) | ORAL | Status: DC | PRN
  Administered 2020-12-01 – 2020-12-02 (×5): 5 mg via ORAL
  Filled 2020-12-01 (×5): qty 1

## 2020-12-01 MED ORDER — OXYBUTYNIN CHLORIDE ER 15 MG PO TB24
15.0000 mg | ORAL_TABLET | Freq: Every day | ORAL | Status: DC
  Administered 2020-12-01 (×2): 15 mg via ORAL
  Filled 2020-12-01 (×3): qty 1

## 2020-12-01 MED ORDER — IRBESARTAN 75 MG PO TABS
75.0000 mg | ORAL_TABLET | Freq: Every day | ORAL | Status: DC
  Administered 2020-12-02: 75 mg via ORAL
  Filled 2020-12-01: qty 1

## 2020-12-01 MED ORDER — DESMOPRESSIN ACETATE 0.1 MG PO TABS: 0.1000 mg | ORAL_TABLET | Freq: Every day | ORAL | Status: DC

## 2020-12-01 MED ORDER — TAMSULOSIN HCL 0.4 MG PO CAPS: 0.4000 mg | ORAL_CAPSULE | Freq: Every day | ORAL | Status: DC

## 2020-12-01 MED ORDER — POLYETHYLENE GLYCOL 3350 17 G PO PACK: 17.0000 g | PACK | Freq: Every day | ORAL | Status: DC | PRN

## 2020-12-01 MED ORDER — ACETAMINOPHEN 325 MG PO TABS
650.0000 mg | ORAL_TABLET | Freq: Four times a day (QID) | ORAL | Status: DC | PRN
  Administered 2020-12-01 – 2020-12-02 (×3): 650 mg via ORAL
  Filled 2020-12-01 (×3): qty 2

## 2020-12-01 MED ORDER — PANTOPRAZOLE SODIUM 40 MG PO TBEC
40.0000 mg | DELAYED_RELEASE_TABLET | Freq: Every day | ORAL | Status: DC
  Administered 2020-12-01 – 2020-12-02 (×2): 40 mg via ORAL
  Filled 2020-12-01 (×2): qty 1

## 2020-12-01 MED ORDER — SODIUM CHLORIDE 0.9 % IV SOLN
2.0000 g | Freq: Once | INTRAVENOUS | Status: AC
  Administered 2020-12-01: 2 g via INTRAVENOUS
  Filled 2020-12-01: qty 2

## 2020-12-01 NOTE — Progress Notes (Signed)
   12/01/20 1552  Assess: MEWS Score  Temp (!) 101.6 F (38.7 C)  BP 123/70  Pulse Rate 86  Resp 18  SpO2 97 %  O2 Device Room Air  Assess: MEWS Score  MEWS Temp 2  MEWS Systolic 0  MEWS Pulse 0  MEWS RR 0  MEWS LOC 0  MEWS Score 2  MEWS Score Color Yellow  Assess: if the MEWS score is Yellow or Red  Were vital signs taken at a resting state? Yes  Focused Assessment Change from prior assessment (see assessment flowsheet)  Early Detection of Sepsis Score *See Row Information* Low  MEWS guidelines implemented *See Row Information* No, vital signs rechecked  Notify: Charge Nurse/RN  Name of Charge Nurse/RN Notified Roj RN  Date Charge Nurse/RN Notified 12/01/20  Time Charge Nurse/RN Notified 1600  Notify: Provider  Provider Name/Title Dewayne Hatch, MD  Date Provider Notified 12/01/20  Time Provider Notified 1600  Notification Type Page  Notification Reason Change in status  Response See new orders  Date of Provider Response 12/01/20  Time of Provider Response 4270  Patient assessed with a temperature of 101.6 @1532  with all other VSS.  Patient diaphoretic reporting chills, otherwise asymptomatic. Dewayne Hatch, MD notified of hyperthermia with orders for admin. of PRN tylenol placed for alleviation of fever.  Patient's vital signs re-assessed with an improved temperature of 99.9 F and asymptomatic at this time.  Will con't to monitor patient throughout the shift for any acute change in status.

## 2020-12-01 NOTE — ED Notes (Signed)
MS   Breakfast ordered  

## 2020-12-01 NOTE — Progress Notes (Incomplete)
    HD#0 Subjective:  Overnight Events: ***   ***  Objective:  Vital signs in last 24 hours: Vitals:   12/01/20 0013 12/01/20 0115 12/01/20 0424 12/01/20 0600  BP: 129/81 123/86 112/81 127/76  Pulse: 85 63 70 85  Resp: 18  18 18   Temp: 98.6 F (37 C)   98.1 F (36.7 C)  TempSrc: Oral   Oral  SpO2: 100% 99% 98% 100%  Weight:      Height:       Supplemental O2: {NAMES:3044014::"Room Air","Nasal Cannula","Simple Face Mask","Partial Rebreather","HFNC","Non Rebreather","Venturi Mask","Bag Valve Mask"} SpO2: 100 %   Physical Exam:  Physical Exam  Filed Weights   11/30/20 1735  Weight: 77.1 kg     Intake/Output Summary (Last 24 hours) at 12/01/2020 0901 Last data filed at 12/01/2020 0841 Gross per 24 hour  Intake 200 ml  Output 200 ml  Net 0 ml   Net IO Since Admission: 0 mL [12/01/20 0901]  Pertinent Labs: CBC Latest Ref Rng & Units 12/01/2020 11/30/2020 01/03/2015  WBC 4.0 - 10.5 K/uL 5.0 6.4 5.2  Hemoglobin 13.0 - 17.0 g/dL 9.9(L) 10.4(L) 13.8  Hematocrit 39.0 - 52.0 % 31.9(L) 33.7(L) 40.5  Platelets 150 - 400 K/uL 229 256 157    CMP Latest Ref Rng & Units 12/01/2020 11/30/2020 01/03/2015  Glucose 70 - 99 mg/dL 126(H) 88 138(H)  BUN 8 - 23 mg/dL 12 12 15   Creatinine 0.61 - 1.24 mg/dL 0.92 0.86 0.87  Sodium 135 - 145 mmol/L 136 135 134(L)  Potassium 3.5 - 5.1 mmol/L 3.9 3.8 3.1(L)  Chloride 98 - 111 mmol/L 102 101 104  CO2 22 - 32 mmol/L 27 25 23   Calcium 8.9 - 10.3 mg/dL 8.2(L) 8.5(L) 8.5  Total Protein 6.5 - 8.1 g/dL - 6.2(L) -  Total Bilirubin 0.3 - 1.2 mg/dL - 0.3 -  Alkaline Phos 38 - 126 U/L - 59 -  AST 15 - 41 U/L - 16 -  ALT 0 - 44 U/L - 18 -    Imaging: DG Tibia/Fibula Right  Result Date: 11/30/2020 CLINICAL DATA:  Infection redness swelling drainage to right calf EXAM: RIGHT TIBIA AND FIBULA - 2 VIEW COMPARISON:  None. FINDINGS: No fracture or malalignment. No periostitis or bone destruction. Diffuse soft tissue edema without foreign body or soft  tissue gas. Focal skin thickening medial side of the distal lower leg. IMPRESSION: 1. No acute osseous abnormality. 2. Diffuse soft tissue edema. Electronically Signed   By: Donavan Foil M.D.   On: 11/30/2020 18:14    Assessment/Plan:   Principal Problem:   Right Lower Extremity Wound infection   Patient Summary: [name] is a 70 y.o. with a pertinent PMH of ***, who presented with *** and admitted for ***.    *** ***  *** ***  *** ***  *** ***  Diet: {NAMES:3044014::"Normal","Heart Healthy","Carb-Modified","Renal","Carb/Renal","NPO","TPN","Tube Feeds"} IVF: {NAMES:3044014::"None","NS","1/2 NS","LR","D5","D10"},{NAMES:3044014::"None","10cc/hr","25cc/hr","50cc/hr","75cc/hr","100cc/hr","110cc/hr","125cc/hr","Bolus"} VTE: {NAMES:3044014::"Heparin","Enoxaparin","SCDs","NOAC","None"} Code: {NAMES:3044014::"Full","DNR","DNI","DNR/DNI","Comfort Care","Unknown"} PT/OT recs: {NAMES:3044014::"None","Pending","CIR","SNF for Subacute PT","LTAC","Home Health"}, {Assistive Devices ZOX:09604}. TOC recs: ***   Dispo: Anticipated discharge to {Discharge Destination:18313::"Home"} in {NUMBERS:20191} days pending ***.   Sanjuana Letters DO Internal Medicine Resident PGY-1 Pager [phone] Please contact the on call pager after 5 pm and on weekends at [phone].
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
## 2020-12-01 NOTE — ED Notes (Signed)
Report given to Ronnie RN

## 2020-12-01 NOTE — ED Notes (Signed)
Attempted to call report

## 2020-12-02 ENCOUNTER — Inpatient Hospital Stay (HOSPITAL_COMMUNITY): Payer: Medicare (Managed Care)

## 2020-12-02 DIAGNOSIS — L0390 Cellulitis, unspecified: Secondary | ICD-10-CM

## 2020-12-02 LAB — CBC WITH DIFFERENTIAL/PLATELET
Abs Immature Granulocytes: 0.01 10*3/uL (ref 0.00–0.07)
Basophils Absolute: 0 10*3/uL (ref 0.0–0.1)
Basophils Relative: 1 %
Eosinophils Absolute: 0.2 10*3/uL (ref 0.0–0.5)
Eosinophils Relative: 4 %
HCT: 30.7 % — ABNORMAL LOW (ref 39.0–52.0)
Hemoglobin: 9.7 g/dL — ABNORMAL LOW (ref 13.0–17.0)
Immature Granulocytes: 0 %
Lymphocytes Relative: 20 %
Lymphs Abs: 1 10*3/uL (ref 0.7–4.0)
MCH: 29.9 pg (ref 26.0–34.0)
MCHC: 31.6 g/dL (ref 30.0–36.0)
MCV: 94.8 fL (ref 80.0–100.0)
Monocytes Absolute: 0.6 10*3/uL (ref 0.1–1.0)
Monocytes Relative: 12 %
Neutro Abs: 3.2 10*3/uL (ref 1.7–7.7)
Neutrophils Relative %: 63 %
Platelets: 201 10*3/uL (ref 150–400)
RBC: 3.24 MIL/uL — ABNORMAL LOW (ref 4.22–5.81)
RDW: 13.2 % (ref 11.5–15.5)
WBC: 5.1 10*3/uL (ref 4.0–10.5)
nRBC: 0 % (ref 0.0–0.2)

## 2020-12-02 LAB — BASIC METABOLIC PANEL
Anion gap: 8 (ref 5–15)
BUN: 9 mg/dL (ref 8–23)
CO2: 24 mmol/L (ref 22–32)
Calcium: 8 mg/dL — ABNORMAL LOW (ref 8.9–10.3)
Chloride: 100 mmol/L (ref 98–111)
Creatinine, Ser: 0.85 mg/dL (ref 0.61–1.24)
GFR, Estimated: >60 mL/min (ref >60)
Glucose, Bld: 97 mg/dL (ref 70–99)
Potassium: 4 mmol/L (ref 3.5–5.1)
Sodium: 132 mmol/L — ABNORMAL LOW (ref 135–145)

## 2020-12-02 LAB — GLUCOSE, CAPILLARY: Glucose-Capillary: 92 mg/dL (ref 70–99)

## 2020-12-02 MED ORDER — OXYCODONE HCL 5 MG PO TABS: 5.0000 mg | ORAL_TABLET | Freq: Three times a day (TID) | ORAL | 0 refills | Status: AC | PRN

## 2020-12-02 MED ORDER — LEVOFLOXACIN 750 MG PO TABS: 750.0000 mg | ORAL_TABLET | Freq: Every day | ORAL | 0 refills | Status: AC

## 2020-12-02 MED ORDER — SILVER SULFADIAZINE 1 % EX CREA: TOPICAL_CREAM | Freq: Two times a day (BID) | CUTANEOUS | 0 refills | Status: DC
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
## 2020-12-02 NOTE — Progress Notes (Signed)
ABI's have been completed. Preliminary results can be found in CV Proc through chart review.   12/02/20 12:19 PM [name] RVT

## 2020-12-02 NOTE — Progress Notes (Addendum)
Patient's social worker Hershal Coria notified this RN of pending discharge.  Social worker to arrange pickup at 5pm for patient with [facility] providing transportation.  Pate received discharge information regarding wound care, follow-up appointments and new medications added to patient's current regimen.  Social worker verbalized understanding of information and has notified transportation of pickup time at 5pm.  Discharge order has been placed by attending MD with discharge paperwork, including AVS, ready for pickup.  Patient is currently awaiting transport.    1700: [facility] has arrived to transport patient home.  Patient to be transported downstairs by NT.
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
## 2020-12-02 NOTE — Progress Notes (Signed)
Internal Medicine Attending:   I saw and examined the patient. I reviewed the resident's note and I agree with the resident's findings and plan as documented in the resident's note. ABIs show mild right peripheral arterial disease but normal right TBI.  Wound culture is growing Pseudomonas patient does appear clinically improved with Vanco and cefepime did not previously receive any pseudomonal coverage with outpatient regimen.  Therefore we will plan to change to oral for quinolone and discharge with close outpatient follow-up with pace.

## 2020-12-02 NOTE — Discharge Instructions (Signed)
[name],  It was a pleasure taking care of you. You were admitted for a skin infection. We treated you with antibiotics and wound care. We are discharging you with a prescription for antibiotics (levofloxacin) as well as a short-course of pain medication (oxycodone). I have spoken with your primary care doctor, Dr. Bradd Burner, who is aware of these instructions.  I have also shared instructions for your wound care to your care team.  Take care!

## 2020-12-02 NOTE — Progress Notes (Signed)
A 70- year- old male was admitted to the hospital due to an ongoing skin soft tissue infection. He is receiving cefepime 2 g every eight hours and vancomycin 1000 mg every twelve hours to treat the skin-soft tissue infection. The plan is to discharge the patient today, and Dr. Shon Baton wanted to transition to oral medication for his skin infection. I recommended levofloxacin 750 mg daily for five days to treat his skin infection. The patient will be on his antibiotic treatment for a total of seven days.   Neomia Glass, Student Pharmacist

## 2020-12-06 ENCOUNTER — Ambulatory Visit: Payer: Medicare (Managed Care) | Admitting: Physician Assistant

## 2020-12-06 LAB — AEROBIC/ANAEROBIC CULTURE W GRAM STAIN (SURGICAL/DEEP WOUND)

## 2020-12-21 ENCOUNTER — Other Ambulatory Visit
Admission: RE | Admit: 2020-12-21 | Discharge: 2020-12-21 | Disposition: A | Discharge status: Home / Self Care | Payer: Medicare (Managed Care) | Source: Ambulatory Visit | Attending: Physician Assistant | Admitting: Physician Assistant

## 2020-12-21 ENCOUNTER — Encounter: Payer: Medicare (Managed Care) | Attending: Physician Assistant | Admitting: Physician Assistant

## 2020-12-21 ENCOUNTER — Other Ambulatory Visit: Payer: Self-pay

## 2020-12-21 DIAGNOSIS — F79 Unspecified intellectual disabilities: Secondary | ICD-10-CM | POA: Diagnosis not present

## 2020-12-21 DIAGNOSIS — B999 Unspecified infectious disease: Secondary | ICD-10-CM | POA: Insufficient documentation

## 2020-12-21 DIAGNOSIS — L97912 Non-pressure chronic ulcer of unspecified part of right lower leg with fat layer exposed: Secondary | ICD-10-CM | POA: Diagnosis not present

## 2020-12-21 DIAGNOSIS — Z87820 Personal history of traumatic brain injury: Secondary | ICD-10-CM | POA: Insufficient documentation

## 2020-12-21 DIAGNOSIS — I872 Venous insufficiency (chronic) (peripheral): Secondary | ICD-10-CM | POA: Diagnosis not present

## 2020-12-21 DIAGNOSIS — X58XXXA Exposure to other specified factors, initial encounter: Secondary | ICD-10-CM | POA: Diagnosis not present

## 2020-12-21 DIAGNOSIS — S81801A Unspecified open wound, right lower leg, initial encounter: Secondary | ICD-10-CM | POA: Insufficient documentation

## 2020-12-22 NOTE — Progress Notes (Signed)
SPURGEON, GANCARZ ([id number]) Visit Report for 12/21/2020 Abuse/Suicide Risk Screen Details Patient Name: [name], [name] Date of Service: 12/21/2020 1:00 PM Medical Record Number: [id number] Patient Account Number: [id number] Date of Birth/Sex: Jul 01, 1951 (69 y.o. M) Treating RN: Carlene Coria Primary Care Glessie Eustice: Barney Drain Other Clinician: Referring Catina Nuss: Barney Drain Treating Khyan Oats/Extender: Skipper Cliche in Treatment: 0 Abuse/Suicide Risk Screen Items Answer ABUSE RISK SCREEN: Has anyone close to you tried to hurt or harm you recentlyo No Do you feel uncomfortable with anyone in your familyo No Has anyone forced you do things that you didnot want to doo No Electronic Signature(s) Signed: 12/22/2020 9:59:20 AM By: Carlene Coria RN Entered By: Carlene Coria on 12/21/2020 13:42:25 [name] ([id number]) -------------------------------------------------------------------------------- Activities of Daily Living Details Patient Name: [name] Date of Service: 12/21/2020 1:00 PM Medical Record Number: [id number] Patient Account Number: [id number] Date of Birth/Sex: 1951/11/10 (69 y.o. M) Treating RN: Carlene Coria Primary Care Shaquasia Caponigro: Barney Drain Other Clinician: Referring Kerisha Goughnour: Barney Drain Treating Micharl Helmes/Extender: Skipper Cliche in Treatment: 0 Activities of Daily Living Items Answer Activities of Daily Living (Please select one for each item) Drive Automobile Not Able Take Medications Need Assistance Use Telephone Need Assistance Care for Appearance Need Assistance Use Toilet Need Assistance Bath / Shower Need Assistance Dress Self Need Assistance Feed Self Need Assistance Walk Need Assistance Get In / Out Bed Need Assistance Housework Need Assistance Prepare Meals Need Assistance Handle Money Need Assistance Shop for Self Need Assistance Electronic Signature(s) Signed: 12/22/2020 9:59:20 AM By: Carlene Coria RN Entered  By: Carlene Coria on 12/21/2020 13:42:55 [name] ([id number]) -------------------------------------------------------------------------------- Education Screening Details Patient Name: [name] Date of Service: 12/21/2020 1:00 PM Medical Record Number: [id number] Patient Account Number: [id number] Date of Birth/Sex: 02/13/1951 (69 y.o. M) Treating RN: Carlene Coria Primary Care Quiera Diffee: Barney Drain Other Clinician: Referring Furkan Keenum: Barney Drain Treating Anessa Charley/Extender: Skipper Cliche in Treatment: 0 Primary Learner Assessed: Patient Learning Preferences/Education Level/Primary Language Learning Preference: Explanation, Demonstration Highest Education Level: Grade School Preferred Language: English Cognitive Barrier Language Barrier: No Translator Needed: No Memory Deficit: No Emotional Barrier: No Physical Barrier Impaired Vision: No Impaired Hearing: No Decreased Hand dexterity: No Knowledge/Comprehension Knowledge Level: Low Comprehension Level: Low Ability to understand written instructions: Low Ability to understand verbal instructions: Medium Motivation Anxiety Level: Calm Cooperation: Cooperative Education Importance: Acknowledges Need Interest in Health Problems: Asks Questions Perception: Coherent Willingness to Engage in Self-Management High Activities: Readiness to Engage in Self-Management Low Activities: Electronic Signature(s) Signed: 12/22/2020 9:59:20 AM By: Carlene Coria RN Entered By: Carlene Coria on 12/21/2020 13:43:46 [name] ([id number]) -------------------------------------------------------------------------------- Fall Risk Assessment Details Patient Name: [name] Date of Service: 12/21/2020 1:00 PM Medical Record Number: [id number] Patient Account Number: [id number] Date of Birth/Sex: [birth year] (69 y.o. M) Treating RN: Carlene Coria Primary Care Kaliya Shreiner: Barney Drain Other  Clinician: Referring Kaliana Albino: Barney Drain Treating Glee Lashomb/Extender: Skipper Cliche in Treatment: 0 Fall Risk Assessment Items Have you had 2 or more falls in the last 12 monthso 0 No Have you had any fall that resulted in injury in the last 12 monthso 0 No FALLS RISK SCREEN History of falling - immediate or within 3 months 0 No Secondary diagnosis (Do you have 2 or more medical diagnoseso) 0 No Ambulatory aid None/bed rest/wheelchair/nurse 0 No Crutches/cane/walker 0 No Furniture 0 No Intravenous therapy Access/Saline/Heparin Lock 0 No Gait/Transferring Normal/ bed rest/ wheelchair 0 No Weak (short steps with or without shuffle, stooped but able  to lift head while walking, may 0 No seek support from furniture) Impaired (short steps with shuffle, may have difficulty arising from chair, head down, impaired 0 No balance) Mental Status Oriented to own ability 0 No Electronic Signature(s) Signed: 12/22/2020 9:59:20 AM By: Carlene Coria RN Entered By: Carlene Coria on 12/21/2020 13:43:53 [name] ([id number]) -------------------------------------------------------------------------------- Foot Assessment Details Patient Name: [name] Date of Service: 12/21/2020 1:00 PM Medical Record Number: [id number] Patient Account Number: [id number] Date of Birth/Sex: Nov 18, 1951 (69 y.o. M) Treating RN: Carlene Coria Primary Care Debrah Granderson: Barney Drain Other Clinician: Referring Bettie Swavely: Barney Drain Treating Danira Nylander/Extender: Skipper Cliche in Treatment: 0 Foot Assessment Items Site Locations + = Sensation present, - = Sensation absent, C = Callus, U = Ulcer R = Redness, W = Warmth, M = Maceration, PU = Pre-ulcerative lesion F = Fissure, S = Swelling, D = Dryness Assessment Right: Left: Other Deformity: No No Prior Foot Ulcer: No No Prior Amputation: No No Charcot Joint: No No Ambulatory Status: Ambulatory With Help Assistance Device: Cane Gait:  Steady Electronic Signature(s) Signed: 12/22/2020 9:59:20 AM By: Carlene Coria RN Entered By: Carlene Coria on 12/21/2020 13:44:44 [name] ([id number]) -------------------------------------------------------------------------------- Nutrition Risk Screening Details Patient Name: [name] Date of Service: 12/21/2020 1:00 PM Medical Record Number: [id number] Patient Account Number: [id number] Date of Birth/Sex: 05-24-1951 (69 y.o. M) Treating RN: Carlene Coria Primary Care Giselle Brutus: Barney Drain Other Clinician: Referring Jamilia Jacques: Barney Drain Treating Taheem Fricke/Extender: Skipper Cliche in Treatment: 0 Height (in): 67 Weight (lbs): 170 Body Mass Index (BMI): 26.6 Nutrition Risk Screening Items Score Screening NUTRITION RISK SCREEN: I have an illness or condition that made me change the kind and/or amount of food I eat 0 No I eat fewer than two meals per day 0 No I eat few fruits and vegetables, or milk products 0 No I have three or more drinks of beer, liquor or wine almost every day 2 Yes I have tooth or mouth problems that make it hard for me to eat 0 No I don't always have enough money to buy the food I need 0 No I eat alone most of the time 0 No I take three or more different prescribed or over-the-counter drugs a day 1 Yes Without wanting to, I have lost or gained 10 pounds in the last six months 0 No I am not always physically able to shop, cook and/or feed myself 0 No Nutrition Protocols Good Risk Protocol Moderate Risk Protocol 0 Provide education on nutrition High Risk Proctocol Risk Level: Moderate Risk Score: 3 Electronic Signature(s) Signed: 12/22/2020 9:59:20 AM By: Carlene Coria RN Entered By: Carlene Coria on 12/21/2020 13:44:13

## 2020-12-22 NOTE — Progress Notes (Signed)
SRIHAAN, BEEVERS ([id number]) Visit Report for 12/21/2020 Chief Complaint Document Details Patient Name: [name], [name] Date of Service: 12/21/2020 1:00 PM Medical Record Number: [id number] Patient Account Number: [id number] Date of Birth/Sex: Mar 20, 1951 (69 y.o. M) Treating RN: Dolan Amen Primary Care Provider: Barney Drain Other Clinician: Referring Provider: Barney Drain Treating Provider/Extender: Skipper Cliche in Treatment: 0 Information Obtained from: Patient Chief Complaint Right LE Ulcer Electronic Signature(s) Signed: 12/21/2020 1:59:42 PM By: Worthy Keeler PA-C Entered By: Worthy Keeler on 12/21/2020 13:59:42 [name] ([id number]) -------------------------------------------------------------------------------- Debridement Details Patient Name: [name] Date of Service: 12/21/2020 1:00 PM Medical Record Number: [id number] Patient Account Number: [id number] Date of Birth/Sex: 1951/03/24 (69 y.o. M) Treating RN: Dolan Amen Primary Care Provider: Barney Drain Other Clinician: Referring Provider: Barney Drain Treating Provider/Extender: Skipper Cliche in Treatment: 0 Debridement Performed for Wound #1 Right,Medial Lower Leg Assessment: Performed By: Physician Tommie Sams., PA-C Debridement Type: Debridement Severity of Tissue Pre Debridement: Fat layer exposed Level of Consciousness (Pre- Awake and Alert procedure): Pre-procedure Verification/Time Out Yes - 14:09 Taken: Start Time: 14:09 Pain Control: Lidocaine 4% Topical Solution Total Area Debrided (L x W): 9 (cm) x 8 (cm) = 72 (cm) Tissue and other material Viable, Non-Viable, Slough, Subcutaneous, Slough debrided: Level: Skin/Subcutaneous Tissue Debridement Description: Excisional Instrument: Curette Bleeding: Minimum Hemostasis Achieved: Pressure End Time: 14:15 Response to Treatment: Procedure was tolerated well Level of Consciousness (Post- Awake and  Alert procedure): Post Debridement Measurements of Total Wound Length: (cm) 9 Width: (cm) 8 Depth: (cm) 0.3 Volume: (cm) 16.965 Character of Wound/Ulcer Post Debridement: Stable Severity of Tissue Post Debridement: Fat layer exposed Post Procedure Diagnosis Same as Pre-procedure Electronic Signature(s) Signed: 12/21/2020 4:36:53 PM By: Charlett Nose RN Signed: 12/21/2020 9:50:44 PM By: Worthy Keeler PA-C Entered By: Georges Mouse, Minus Breeding on 12/21/2020 14:15:55 [name] ([id number]) -------------------------------------------------------------------------------- HPI Details Patient Name: [name] Date of Service: 12/21/2020 1:00 PM Medical Record Number: [id number] Patient Account Number: [id number] Date of Birth/Sex: 1951/02/18 (69 y.o. M) Treating RN: Dolan Amen Primary Care Provider: Barney Drain Other Clinician: Referring Provider: Barney Drain Treating Provider/Extender: Skipper Cliche in Treatment: 0 History of Present Illness HPI Description: 12/21/2020 upon evaluation today patient appears to be doing somewhat poorly in regard to his right lower extremity. He does have some erythema and is also having issues with quite a bit of drainage currently from a wound that he tells me has been present since about 2 months ago when he initially had an injury that led to this wound beginning. He does have chronic venous stasis and is secondary to this I think it has been more difficult to heal this wound than normal. With that being said the patient tells me that this tends to drain a lot down onto his feet. He is also had some issues with fungal infection he is currently on miconazole powder for that. Fortunately there is no signs of active infection systemically at this time. No fevers, chills, nausea, vomiting, or diarrhea. He does have pain but again this is more to the posterior aspect not really more lateral or anterior. There is evidence that he  may have some local infection due to erythema and warmth surrounding the wound post debridement I do want to obtain a wound culture to evaluate and see if there is anything indeed of concern here. He did have arterial studies which revealed that he had a ABI of 0.91 on the right with a  TBI of 0.76 in the left his ABI was 1.18 with a TBI of 0.73. He has good blood flow here. In regard to his x-ray that was taken on 11/30/2020 there was no evidence of osseous abnormality. The arterial study was actually performed on 12/03/2020. The patient was hospitalized from 11/30/2020 through 12/02/2020 during that time he was on IV antibiotics including vancomycin and cefepime. Electronic Signature(s) Signed: 12/21/2020 2:24:28 PM By: Lenda KelpStone III, Jermayne Sweeney PA-C Entered By: Lenda KelpStone III, Ranvir Renovato on 12/21/2020 14:24:28 [name], Perry C. ([id number]) -------------------------------------------------------------------------------- Physical Exam Details Patient Name: [name], Perry C. Date of Service: 12/21/2020 1:00 PM Medical Record Number: [id number] Patient Account Number: [id number] Date of Birth/Sex: 06-May-1951 (69 y.o. M) Treating RN: Rogers BlockerSanchez, Kenia Primary Care Provider: Marny LowensteinKOEHLER, ROBERT Other Clinician: Referring Provider: Marny LowensteinKOEHLER, ROBERT Treating Provider/Extender: Rowan BlaseStone, Morey Andonian Weeks in Treatment: 0 Constitutional sitting or standing blood pressure is within target range for patient.. pulse regular and within target range for patient.Marland Kitchen. respirations regular, non- labored and within target range for patient.Marland Kitchen. temperature within target range for patient.. Well-nourished and well-hydrated in no acute distress. Eyes conjunctiva clear no eyelid edema noted. pupils equal round and reactive to light and accommodation. Ears, Nose, Mouth, and Throat no gross abnormality of ear auricles or external auditory canals. normal hearing noted during conversation. mucus membranes moist. Respiratory normal breathing without  difficulty. Cardiovascular 2+ dorsalis pedis/posterior tibialis pulses. 1+ pitting edema of the bilateral lower extremities. Musculoskeletal Patient unable to walk without assistance. no significant deformity or arthritic changes, no loss or range of motion, no clubbing. Psychiatric this patient is able to make decisions and demonstrates good insight into disease process. Alert and Oriented x 3. pleasant and cooperative. Notes Upon inspection patient's wound bed actually showed signs of fairly good granulation at this time. There does not appear to be any evidence of active infection systemically which is great news. With that being said he has some slough buildup on some area of the wound as well this is good to require sharp debridement. Fortunately there does not appear to be any evidence of systemic infection which is great news. I did perform sharp debridement using a 5 mm curette the patient tolerated this for the most part without complication he did have some discomfort but this was not too extravagant. Post debridement wound bed appears to be doing much better which is great news. I do believe that appropriate compression therapy can definitely help with healing especially after a good debridement here today. Electronic Signature(s) Signed: 12/21/2020 2:25:20 PM By: Lenda KelpStone III, Paxtyn Wisdom PA-C Entered By: Lenda KelpStone III, Ramzy Cappelletti on 12/21/2020 14:25:20 [name], Perry C. ([id number]) -------------------------------------------------------------------------------- Physician Orders Details Patient Name: [name], Perry C. Date of Service: 12/21/2020 1:00 PM Medical Record Number: [id number] Patient Account Number: [id number] Date of Birth/Sex: 06-May-1951 (69 y.o. 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 o Nurse Visit as needed Home Health Wound #1 Right,Medial Lower Leg o Home Health Company: - [facility] o CONTINUE Home Health for wound care. May utilize formulary equivalent dressing for wound treatment orders unless  otherwise specified. Home Health Nurse may visit PRN to address patientos wound care needs. - Change dressing 2 x a week- Tuesday/Friday o Scheduled days for dressing changes to be completed; exception, patient has scheduled wound care visit that day. o **Please direct any NON-WOUND related issues/requests for orders to patient's Primary Care Physician. **If current dressing causes regression in wound condition, may D/C ordered dressing product/s and apply Normal Saline Moist Dressing daily until next [place] or Other MD appointment. **Notify [facility] of regression in wound condition at [phone]. Bathing/ Shower/ Hygiene Wound #1 Right,Medial Lower Leg o May shower with wound dressing protected with water repellent cover or cast protector. Anesthetic (Use 'Patient Medications' Section for Anesthetic Order Entry) Wound #1 Right,Medial Lower Leg o Lidocaine applied to wound bed o Benzocaine applied to wound bed. Edema Control - Lymphedema / Segmental Compressive Device / Other Wound #1 Right,Medial Lower Leg o Optional: One layer of unna paste to top of compression wrap (to act as an anchor). o 3 Layer Compression System for Lymphedema. o Elevate legs to the level of the heart and pump ankles as often as  possible o Elevate leg(s) parallel to the floor when sitting. Wound Treatment Wound #1 - Lower Leg Wound Laterality: Right, Medial Cleanser: Normal Saline 2 x Per Week/30 Days Discharge Instructions: Wash your hands with soap and water. Remove old dressing, discard into plastic bag and place into trash. Cleanse the wound with Normal Saline prior to applying a clean dressing using gauze sponges, not tissues or cotton balls. Do not scrub or use excessive force. Pat dry using gauze sponges, not tissue or cotton balls. Cleanser: Soap and Water 2 x Per Week/30 Days Discharge Instructions: Gently cleanse wound with antibacterial soap, rinse and pat dry prior to dressing wounds Primary Dressing: Hydrofera Blue Ready Transfer Foam, 4x5 (in/in) 2 x Per Week/30 Days KEYGAN, DUMOND ([id number]) Discharge Instructions: Apply Hydrofera Blue Ready to wound bed as directed Secondary Dressing: Xtrasorb Large 6x9 (in/in) 2 x Per Week/30 Days Discharge Instructions: Apply to wound as directed. Do not cut. Compression Wrap: Profore Lite LF 3 Multilayer Compression Bandaging System 2 x Per Week/30 Days Discharge Instructions: Apply 3 multi-layer wrap as prescribed. Compression Wrap: Unna w/Calamine, 4x10 (in/yd) 2 x Per Week/30 Days Discharge Instructions: Apply unna paste -3 finger-widths below knee to anchor compression wrap in place. Laboratory o Bacteria identified in Wound by Culture (MICRO) oooo LOINC Code: [phone] oooo Convenience Name: Wound culture routine Electronic Signature(s) Signed: 12/21/2020 4:36:53 PM By: Georges Mouse, Minus Breeding RN Signed: 12/21/2020 9:50:44 PM By: Worthy Keeler PA-C Entered By: Georges Mouse, Minus Breeding on 12/21/2020 14:25:41 [name] ([id number]) -------------------------------------------------------------------------------- Problem List Details Patient Name: [name] Date of Service: 12/21/2020 1:00 PM Medical Record Number: [id number] Patient  Account Number: [id number] Date of Birth/Sex: 28-May-1951 (69 y.o. 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 M) Treating RN: Dolan Amen Primary Care Provider: Barney Drain Other Clinician: Referring Provider: Barney Drain Treating Provider/Extender: Skipper Cliche in Treatment: 0 Subjective Chief Complaint Information obtained from Patient Right LE Ulcer History of Present Illness (HPI) 12/21/2020 upon evaluation today patient appears to be doing somewhat poorly in regard to his right lower extremity. He does have some erythema and is also having issues with quite a bit of drainage currently from a wound that he tells me has been present since about 2 months ago when he initially had an injury that led to this wound beginning. He does have chronic venous stasis and is secondary to this I think it has been more  difficult to heal this wound than normal. With that being said the patient tells me that this tends to drain a lot down onto his feet. He is also had some issues with fungal infection he is currently on miconazole powder for that. Fortunately there is no signs of active infection systemically at this time. No fevers, chills, nausea, vomiting, or diarrhea. He does have pain but again this is more to the posterior aspect not really more lateral or anterior. There is evidence that he may have some local infection due to erythema and warmth surrounding the wound post debridement I do want to obtain a wound culture to evaluate and see if there is anything indeed of concern here. He did have arterial studies which revealed that he had a ABI of 0.91 on the right with a TBI of 0.76 in the left his ABI was 1.18 with a TBI of 0.73. He has good blood flow here. In regard to his x-ray that was taken on 11/30/2020 there was no evidence of osseous abnormality. The arterial study was actually performed on 12/03/2020. The patient was hospitalized from 11/30/2020 through 12/02/2020 during that time he was on IV antibiotics including vancomycin and cefepime. Patient History Information obtained from Patient. Allergies No Known Drug Allergies Family History Diabetes - Father. Social History Never smoker, Marital Status - Widowed, Alcohol Use - Daily, Drug Use - No History, Caffeine Use - Daily. Medical History Cardiovascular Patient has history of Hypertension - controlled with medication Endocrine Denies history of Type II Diabetes Integumentary (Skin) Patient has history of History of pressure wounds Neurologic Patient has history of Neuropathy Medical And Surgical History Notes Constitutional Symptoms (General Health) TBI-Tramatic Brain Injury, HTN Mental Retardation Psychiatric Mental retardation, Tramatic Brain Injury Review of Systems (ROS) Constitutional Symptoms (General Health) Denies complaints  or symptoms of Fatigue, Fever, Chills, Marked Weight Change. Eyes Denies complaints or symptoms of Dry Eyes, Vision Changes, Glasses / Contacts. Ear/Nose/Mouth/Throat Denies complaints or symptoms of Difficult clearing ears, Sinusitis. Hematologic/Lymphatic Denies complaints or symptoms of Bleeding / Clotting Disorders, Human Immunodeficiency Virus. Respiratory Denies complaints or symptoms of Chronic or frequent coughs, Shortness of Breath. Cardiovascular Denies complaints or symptoms of Chest pain. Gastrointestinal Denies complaints or symptoms of Frequent diarrhea, Nausea, Vomiting. Genitourinary Denies complaints or symptoms of Kidney failure/ Dialysis, Incontinence/dribbling. KYAIR, VOLOSHIN ([id number]) Immunological Denies complaints or symptoms of Hives, Itching. Integumentary (Skin) Complains or has symptoms of Wounds. Musculoskeletal Denies complaints or symptoms of Muscle Pain, Muscle Weakness. Objective Constitutional sitting or standing blood pressure is within target range for patient.. pulse regular and within target range for patient.Marland Kitchen respirations  regular, non- labored and within target range for patient.Marland Kitchen temperature within target range for patient.. Well-nourished and well-hydrated in no acute distress. Vitals Time Taken: 1:24 PM, Height: 67 in, Weight: 170 lbs, BMI: 26.6, Temperature: 97.7 F, Pulse: 76 bpm, Respiratory Rate: 18 breaths/min, Blood Pressure: 123/82 mmHg. Eyes conjunctiva clear no eyelid edema noted. pupils equal round and reactive to light and accommodation. Ears, Nose, Mouth, and Throat no gross abnormality of ear auricles or external auditory canals. normal hearing noted during conversation. mucus membranes moist. Respiratory normal breathing without difficulty. Cardiovascular 2+ dorsalis pedis/posterior tibialis pulses. 1+ pitting edema of the bilateral lower extremities. Musculoskeletal Patient unable to walk without assistance. no  significant deformity or arthritic changes, no loss or range of motion, no clubbing. Psychiatric this patient is able to make decisions and demonstrates good insight into disease process. Alert and Oriented x 3. pleasant and cooperative. General Notes: Upon inspection patient's wound bed actually showed signs of fairly good granulation at this time. There does not appear to be any evidence of active infection systemically which is great news. With that being said he has some slough buildup on some area of the wound as well this is good to require sharp debridement. Fortunately there does not appear to be any evidence of systemic infection which is great news. I did perform sharp debridement using a 5 mm curette the patient tolerated this for the most part without complication he did have some discomfort but this was not too extravagant. Post debridement wound bed appears to be doing much better which is great news. I do believe that appropriate compression therapy can definitely help with healing especially after a good debridement here today. Integumentary (Hair, Skin) Wound #1 status is Open. Original cause of wound was Trauma. The wound is located on the Right,Medial Lower Leg. The wound measures 9cm length x 8cm width x 0.2cm depth; 56.549cm^2 area and 11.31cm^3 volume. There is Fat Layer (Subcutaneous Tissue) exposed. There is no tunneling or undermining noted. There is a large amount of serous drainage noted. The wound margin is flat and intact. There is no granulation within the wound bed. There is a large (67-100%) amount of necrotic tissue within the wound bed. 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 There was a Excisional Skin/Subcutaneous Tissue Debridement with a total area of 72 sq cm performed by Tommie Sams., PA-C. With the following instrument(s): Curette to remove Viable and Non-Viable tissue/material. Material removed includes Subcutaneous Tissue and Slough and after achieving pain control using Lidocaine 4% Topical Solution. A time out was conducted at 14:09, prior to the start of the procedure. A Minimum amount of bleeding was controlled with Pressure. The procedure was tolerated well. Post Debridement Measurements: 9cm length x 8cm width x 0.3cm depth; 16.965cm^3 volume. Character of Wound/Ulcer Post Debridement is stable. Severity of Tissue Post Debridement is: Fat layer exposed. Post procedure Diagnosis Wound #1: Same as Pre-Procedure Plan Follow-up Appointments: Wound #1 Right,Medial Lower Leg: Return Appointment in 1 week. Nurse Visit as needed Bathing/ Shower/ Hygiene: Wound #1 Right,Medial Lower Leg: May shower with wound dressing protected with water repellent cover or  cast protector. Anesthetic (Use 'Patient Medications' Section for Anesthetic Order Entry): Wound #1 Right,Medial Lower Leg: Lidocaine applied to wound bed Benzocaine applied to wound bed. Edema Control - Lymphedema / Segmental Compressive Device / Other: Wound #1 Right,Medial Lower Leg: Optional: One layer of unna paste to top of compression wrap (to act as an anchor). 3 Layer Compression System for Lymphedema. Elevate legs to the level of the heart and pump ankles as often as possible Elevate leg(s) parallel to the floor when sitting. Laboratory ordered were: Wound culture routine WOUND #1: - Lower Leg Wound Laterality: Right, Medial Cleanser: Normal Saline (Generic) 1 x Per Week/30 Days Discharge Instructions:  Wash your hands with soap and water. Remove old dressing, discard into plastic bag and place into trash. Cleanse the wound with Normal Saline prior to applying a clean dressing using gauze sponges, not tissues or cotton balls. Do not scrub or use excessive force. Pat dry using gauze sponges, not tissue or cotton balls. Cleanser: Soap and Water 1 x Per Week/30 Days Discharge Instructions: Gently cleanse wound with antibacterial soap, rinse and pat dry prior to dressing wounds Primary Dressing: Hydrofera Blue Ready Transfer Foam, 4x5 (in/in) (Generic) 1 x Per Week/30 Days Discharge Instructions: Apply Hydrofera Blue Ready to wound bed as directed Secondary Dressing: Xtrasorb Large 6x9 (in/in) (Generic) 1 x Per Week/30 Days Discharge Instructions: Apply to wound as directed. Do not cut. Compression Wrap: Profore Lite LF 3 Multilayer Compression Bandaging System (Generic) 1 x Per Week/30 Days Discharge Instructions: Apply 3 multi-layer wrap as prescribed. Compression Wrap: Unna w/Calamine, 4x10 (in/yd) (Generic) 1 x Per Week/30 Days Discharge Instructions: Apply unna paste -3 finger-widths below knee to anchor compression wrap in place. 1. I would recommend currently that we going to continue with the wound care measures as before and the patient is in agreement with the plan. This includes the use of a Hydrofera Blue dressing followed by Lauraine Rinne to catch the drainage that is likely to occur. 2. I am also can recommend that the patient go ahead and be placed in a 3 layer compression wrap to help with edema control I think this is still important and I think this may be the best way to go. 3. I would also recommend that he sleep in the bed and elevate his legs much as possible I did discuss that with him today. 4. I did also discuss all this with his Education officer, museum as the patient does not make all medical decisions for himself. The social worker was present during the entire evaluation today. 5. I did  obtain a wound culture today depending on the results of the wound culture I will make adjustments as necessary with regard to his antibiotic therapy. It does appear that he may still have an infection present. We will see patient back for reevaluation in 1 week here in the clinic. If anything worsens or changes patient will contact our office for additional recommendations. Electronic Signature(s) Signed: 12/21/2020 2:26:59 PM By: Worthy Keeler PA-C Previous Signature: 12/21/2020 2:26:13 PM Version By: August Luz, [place] ([id number]) Entered By: Worthy Keeler on 12/21/2020 14:26:59 [name] ([id number]) -------------------------------------------------------------------------------- ROS/PFSH Details Patient Name: [name] Date of Service: 12/21/2020 1:00 PM Medical Record Number: [id number] Patient Account Number: [id number] Date of Birth/Sex: 05-02-1951 (69 y.o. M) Treating RN: Carlene Coria Primary Care Provider: Barney Drain Other Clinician: Referring Provider: Barney Drain Treating Provider/Extender: Skipper Cliche in Treatment: 0 Information Obtained From Patient Constitutional  Symptoms (General Health) Complaints and Symptoms: Negative for: Fatigue; Fever; Chills; Marked Weight Change Medical History: Past Medical History Notes: TBI-Tramatic Brain Injury, HTN Mental Retardation Eyes Complaints and Symptoms: Negative for: Dry Eyes; Vision Changes; Glasses / Contacts Ear/Nose/Mouth/Throat Complaints and Symptoms: Negative for: Difficult clearing ears; Sinusitis Hematologic/Lymphatic Complaints and Symptoms: Negative for: Bleeding / Clotting Disorders; Human Immunodeficiency Virus Respiratory Complaints and Symptoms: Negative for: Chronic or frequent coughs; Shortness of Breath Cardiovascular Complaints and Symptoms: Negative for: Chest pain Medical History: Positive for: Hypertension - controlled with  medication Gastrointestinal Complaints and Symptoms: Negative for: Frequent diarrhea; Nausea; Vomiting Genitourinary Complaints and Symptoms: Negative for: Kidney failure/ Dialysis; Incontinence/dribbling Immunological Complaints and Symptoms: Negative for: Hives; Itching Integumentary (Skin) Complaints and Symptoms: Positive for: Wounds [name], [name] ([id number]) Medical History: Positive for: History of pressure wounds Musculoskeletal Complaints and Symptoms: Negative for: Muscle Pain; Muscle Weakness Endocrine Medical History: Negative for: Type II Diabetes Neurologic Medical History: Positive for: Neuropathy Psychiatric Medical History: Past Medical History Notes: Mental retardation, Tramatic Brain Injury Immunizations Pneumococcal Vaccine: Received Pneumococcal Vaccination: Yes Implantable Devices No devices added Family and Social History Diabetes: Yes - Father; Never smoker; Marital Status - Widowed; Alcohol Use: Daily; Drug Use: No History; Caffeine Use: Daily Electronic Signature(s) Signed: 12/21/2020 9:50:44 PM By: Worthy Keeler PA-C Signed: 12/22/2020 9:59:20 AM By: Carlene Coria RN Entered By: Carlene Coria on 12/21/2020 13:42:17 [name] ([id number]) -------------------------------------------------------------------------------- SuperBill Details Patient Name: [name] Date of Service: 12/21/2020 Medical Record Number: [id number] Patient Account Number: [id number] Date of Birth/Sex: 1951-03-06 (69 y.o. M) Treating RN: Dolan Amen Primary Care Provider: Barney Drain Other Clinician: Referring Provider: Barney Drain Treating Provider/Extender: Skipper Cliche in Treatment: 0 Diagnosis Coding ICD-10 Codes Code Description [phone] Unspecified open wound, right lower leg, initial encounter I87.2 Venous insufficiency (chronic) (peripheral) 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r  exposed Z87.820 Personal history of traumatic brain injury F79 Unspecified intellectual disabilities Facility Procedures CPT4 Code: [id number] Description: 99213 - WOUND CARE VISIT-LEV 3 EST PT Modifier: Quantity: 1 CPT4 Code: [id number] Description: [id number] - DEB SUBQ TISSUE 20 SQ CM/< Modifier: Quantity: 1 CPT4 Code: Description: ICD-10 Diagnosis Description [id number] 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 Modifier: r exposed Quantity: CPT4 Code: [id number] Description: [id number] - DEB SUBQ TISS EA ADDL 20CM Modifier: Quantity: 3 CPT4 Code: Description: ICD-10 Diagnosis Description [id number] 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 Modifier: r exposed Quantity: Physician Procedures CPT4 Code: [id number] Description: [id number] - WC PHYS LEVEL 4 - NEW PT Modifier: 25 Quantity: 1 CPT4 Code: Description: ICD-10 Diagnosis Description S81.801A Unspecified open wound, right lower leg, initial encounter I87.2 Venous insufficiency (chronic) (peripheral) 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 Z87.820  Personal history of traumatic brain injury Modifier: r exposed Quantity: CPT4 Code: [id number] Description: 11042 - WC PHYS SUBQ TISS 20 SQ CM Modifier: Quantity: 1 CPT4 Code: Description: ICD-10 Diagnosis Description [id number] 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

## 2020-12-22 NOTE — Progress Notes (Signed)
LADARRELL, CORNWALL ([id number]) Visit Report for 12/21/2020 Allergy List Details Patient Name: NAHEIM, BURGEN Date of Service: 12/21/2020 1:00 PM Medical Record Number: [id number] Patient Account Number: [id number] Date of Birth/Sex: 1951/04/09 (69 y.o. M) Treating RN: Yevonne Pax Primary Care Jenise Iannelli: Marny Lowenstein Other Clinician: Referring Travas Schexnayder: Marny Lowenstein Treating Elaysha Bevard/Extender: Rowan Blase in Treatment: 0 Allergies Active Allergies No Known Drug Allergies Type: Allergen Allergy Notes Electronic Signature(s) Signed: 12/22/2020 9:59:20 AM By: Yevonne Pax RN Entered By: Yevonne Pax on 12/21/2020 13:51:39 Debbora Dus ([id number]) -------------------------------------------------------------------------------- Arrival Information Details Patient Name: Debbora Dus Date of Service: 12/21/2020 1:00 PM Medical Record Number: [id number] Patient Account Number: [id number] Date of Birth/Sex: 02/29/1951 (69 y.o. M) Treating RN: Yevonne Pax Primary Care Siearra Amberg: Marny Lowenstein Other Clinician: Referring Dyanara Cozza: Marny Lowenstein Treating Reinhart Saulters/Extender: Rowan Blase in Treatment: 0 Visit Information Patient Arrived: Ambulatory Arrival Time: 13:20 Accompanied By: Social worker Transfer Assistance: None Patient Identification Verified: Yes Secondary Verification Process Completed: Yes Patient Has Alerts: Yes Patient Alerts: NOT Diabetic ABI: R)0.91 L) 1.18 TBI R) 0.76 L) 0.73 12/02/2020 [facility] Notes Walking stick Electronic Signature(s) Signed: 12/22/2020 9:59:20 AM By: Yevonne Pax RN Entered By: Yevonne Pax on 12/21/2020 13:51:13 Debbora Dus ([id number]) -------------------------------------------------------------------------------- Clinic Level of Care Assessment Details Patient Name: Debbora Dus Date of Service: 12/21/2020 1:00 PM Medical Record Number: [id number] Patient Account Number: [id number] Date of  Birth/Sex: 23-Jun-1951 (69 y.o. M) Treating RN: Rogers Blocker Primary Care Wood Novacek: Marny Lowenstein Other Clinician: Referring Mishelle Hassan: Marny Lowenstein Treating Dupree Givler/Extender: Rowan Blase in Treatment: 0 Clinic Level of Care Assessment Items TOOL 4 Quantity Score X - Use when only an EandM is performed on FOLLOW-UP visit 1 0 ASSESSMENTS - Nursing Assessment / Reassessment X - Reassessment of Co-morbidities (includes updates in patient status) 1 10 X- 1 5 Reassessment of Adherence to Treatment Plan ASSESSMENTS - Wound and Skin Assessment / Reassessment X - Simple Wound Assessment / Reassessment - one wound 1 5 []  - 0 Complex Wound Assessment / Reassessment - multiple wounds []  - 0 Dermatologic / Skin Assessment (not related to wound area) ASSESSMENTS - Focused Assessment []  - Circumferential Edema Measurements - multi extremities 0 []  - 0 Nutritional Assessment / Counseling / Intervention []  - 0 Lower Extremity Assessment (monofilament, tuning fork, pulses) []  - 0 Peripheral Arterial Disease Assessment (using hand held doppler) ASSESSMENTS - Ostomy and/or Continence Assessment and Care []  - Incontinence Assessment and Management 0 []  - 0 Ostomy Care Assessment and Management (repouching, etc.) PROCESS - Coordination of Care X - Simple Patient / Family Education for ongoing care 1 15 []  - 0 Complex (extensive) Patient / Family Education for ongoing care []  - 0 Staff obtains , Records, Test Results / Process Orders []  - 0 Staff telephones HHA, Nursing Homes / Clarify orders / etc []  - 0 Routine Transfer to another Facility (non-emergent condition) []  - 0 Routine Hospital Admission (non-emergent condition) []  - 0 New Admissions / / Ordering NPWT, Apligraf, etc. []  - 0 Emergency Hospital Admission (emergent condition) X- 1 10 Simple Discharge Coordination []  - 0 Complex (extensive) Discharge Coordination PROCESS - Special  Needs []  - Pediatric / Minor Patient Management 0 []  - 0 Isolation Patient Management []  - 0 Hearing / Language / Visual special needs []  - 0 Assessment of Community assistance (transportation, D/C planning, etc.) []  - 0 Additional assistance / Altered mentation []  - 0 Support Surface(s) Assessment (bed, cushion, seat, etc.) INTERVENTIONS - Wound Cleansing /  Measurement ERASTUS, BARTOLOMEI ([id number]) X- 1 5 Simple Wound Cleansing - one wound []  - 0 Complex Wound Cleansing - multiple wounds X- 1 5 Wound Imaging (photographs - any number of wounds) []  - 0 Wound Tracing (instead of photographs) X- 1 5 Simple Wound Measurement - one wound []  - 0 Complex Wound Measurement - multiple wounds INTERVENTIONS - Wound Dressings []  - Small Wound Dressing one or multiple wounds 0 X- 1 15 Medium Wound Dressing one or multiple wounds []  - 0 Large Wound Dressing one or multiple wounds []  - 0 Application of Medications - topical []  - 0 Application of Medications - injection INTERVENTIONS - Miscellaneous []  - External ear exam 0 X- 1 5 Specimen Collection (cultures, biopsies, blood, body fluids, etc.) X- 1 5 Specimen(s) / Culture(s) sent or taken to Lab for analysis []  - 0 Patient Transfer (multiple staff / Civil Service fast streamer / Similar devices) []  - 0 Simple Staple / Suture removal (25 or less) []  - 0 Complex Staple / Suture removal (26 or more) []  - 0 Hypo / Hyperglycemic Management (close monitor of Blood Glucose) []  - 0 Ankle / Brachial Index (ABI) - do not check if billed separately X- 1 5 Vital Signs Has the patient been seen at the hospital within the last three years: Yes Total Score: 90 Level Of Care: New/Established - Level 3 Electronic Signature(s) Signed: 12/21/2020 4:36:53 PM By: Georges Mouse, Minus Breeding RN Entered By: Georges Mouse, Minus Breeding on 12/21/2020 14:21:32 [name]  ([id number]) -------------------------------------------------------------------------------- Encounter Discharge Information Details Patient Name: [name] Date of Service: 12/21/2020 1:00 PM Medical Record Number: [id number] Patient Account Number: [id number] Date of Birth/Sex: Aug 25, 1951 (69 y.o. M) Treating RN: Carlene Coria Primary Care Sevanna Ballengee: Barney Drain Other Clinician: Referring Nickia Boesen: Barney Drain Treating Zubayr Bednarczyk/Extender: Skipper Cliche in Treatment: 0 Encounter Discharge Information Items Post Procedure Vitals Discharge Condition: Stable Temperature (F): 97.7 Ambulatory Status: Cane Pulse (bpm): 76 Discharge Destination: Home Respiratory Rate (breaths/min): 18 Transportation: Private Auto Blood Pressure (mmHg): 123/82 Accompanied By: Education officer, museum Schedule Follow-up Appointment: Yes Clinical Summary of Care: Patient Declined Electronic Signature(s) Signed: 12/22/2020 9:59:20 AM By: Carlene Coria RN Entered By: Carlene Coria on 12/21/2020 14:39:38 [name] ([id number]) -------------------------------------------------------------------------------- Lower Extremity Assessment Details Patient Name: [name] Date of Service: 12/21/2020 1:00 PM Medical Record Number: [id number] Patient Account Number: [id number] Date of Birth/Sex: 1951-11-08 (69 y.o. M) Treating RN: Carlene Coria Primary Care Kyla Duffy: Barney Drain Other Clinician: Referring Bambi Fehnel: Barney Drain Treating Julieanne Hadsall/Extender: Skipper Cliche in Treatment: 0 Edema Assessment Assessed: [Left: No] [Right: No] [Left: Edema] [Right: :] Calf Left: Right: Point of Measurement: 27 cm From Medial Instep 39 cm Ankle Left: Right: Point of Measurement: 12 cm From Medial Instep 27.2 cm Vascular Assessment Pulses: Dorsalis Pedis Palpable: [Right:Yes] Notes ABI/TBI [facility] 12/02/2020 Accession #[id number] Electronic Signature(s) Signed: 12/22/2020 9:59:20 AM  By: Carlene Coria RN Entered By: Carlene Coria on 12/21/2020 13:49:33 [name] ([id number]) -------------------------------------------------------------------------------- Multi Wound Chart Details Patient Name: [name] Date of Service: 12/21/2020 1:00 PM Medical Record Number: [id number] Patient Account Number: [id number] Date of Birth/Sex: [birth year] (69 y.o. M) Treating RN: Dolan Amen Primary Care Dorsie Burich: Barney Drain Other Clinician: Referring Alexandros Ewan: Barney Drain Treating Malaiya Paczkowski/Extender: Skipper Cliche in Treatment: 0 Vital Signs Height(in): 54 Pulse(bpm): 10 Weight(lbs): 170 Blood Pressure(mmHg): 123/82 Body Mass Index(BMI): 27 Temperature(F): 97.7 Respiratory Rate(breaths/min): 18 Photos: [1:No Photos] [N/A:N/A] Wound Location: [1:Right, Medial Lower Leg] [N/A:N/A] Wounding Event: [1:Trauma] [N/A:N/A] Primary Etiology: [1:Trauma, Other] [  N/A:N/A] Secondary Etiology: [1:Venous Leg Ulcer] [N/A:N/A] Comorbid History: [1:Hypertension, History of pressure wounds, Neuropathy] [N/A:N/A] Date Acquired: [1:10/20/2020] [N/A:N/A] Weeks of Treatment: [1:0] [N/A:N/A] Wound Status: [1:Open] [N/A:N/A] Measurements L x W x D (cm) [1:3x7x0.2] [N/A:N/A] Area (cm) : [1:16.493] [N/A:N/A] Volume (cm) : [1:3.299] [N/A:N/A] % Reduction in Area: [1:0.00%] [N/A:N/A] % Reduction in Volume: [1:0.00%] [N/A:N/A] Classification: [1:Full Thickness Without Exposed Support Structures] [N/A:N/A] Exudate Amount: [1:Large] [N/A:N/A] Exudate Type: [1:Serous] [N/A:N/A] Exudate Color: [1:amber] [N/A:N/A] Wound Margin: [1:Flat and Intact] [N/A:N/A] Granulation Amount: [1:None Present (0%)] [N/A:N/A] Necrotic Amount: [1:Large (67-100%)] [N/A:N/A] Exposed Structures: [1:Fat Layer (Subcutaneous Tissue): Yes Fascia: No Tendon: No Muscle: No Joint: No Bone: No None] [N/A:N/A N/A] Treatment Notes Electronic Signature(s) Signed: 12/21/2020 4:36:53 PM By: Phillis Haggis,  Dondra Prader RN Entered By: Phillis Haggis, Dondra Prader on 12/21/2020 14:10:29 Debbora Dus ([id number]) -------------------------------------------------------------------------------- Multi-Disciplinary Care Plan Details Patient Name: Debbora Dus Date of Service: 12/21/2020 1:00 PM Medical Record Number: [id number] Patient Account Number: [id number] Date of Birth/Sex: 01/22/1951 (69 y.o. M) Treating RN: Rogers Blocker Primary Care Landon Truax: Marny Lowenstein Other Clinician: Referring Preslynn Bier: Marny Lowenstein Treating Saher Davee/Extender: Rowan Blase in Treatment: 0 Active Inactive Necrotic Tissue Nursing Diagnoses: Impaired tissue integrity related to necrotic/devitalized tissue Goals: Necrotic/devitalized tissue will be minimized in the wound bed Date Initiated: 12/21/2020 Target Resolution Date: 12/21/2020 Goal Status: Active Patient/caregiver will verbalize understanding of reason and process for debridement of necrotic tissue Date Initiated: 12/21/2020 Target Resolution Date: 12/21/2020 Goal Status: Active Interventions: Assess patient pain level pre-, during and post procedure and prior to discharge Provide education on necrotic tissue and debridement process Treatment Activities: Apply topical anesthetic as ordered : 12/21/2020 Excisional debridement : 12/21/2020 Notes: Orientation to the Wound Care Program Nursing Diagnoses: Knowledge deficit related to the wound healing center program Goals: Patient/caregiver will verbalize understanding of the Wound Healing Center Program Date Initiated: 12/21/2020 Target Resolution Date: 01/18/2021 Goal Status: Active Interventions: Provide education on orientation to the wound center Notes: Wound/Skin Impairment Nursing Diagnoses: Impaired tissue integrity Knowledge deficit related to ulceration/compromised skin integrity Goals: Patient/caregiver will verbalize understanding of skin care regimen Date Initiated: 12/21/2020 Target  Resolution Date: 12/21/2020 Goal Status: Active Ulcer/skin breakdown will have a volume reduction of 30% by week 4 Date Initiated: 12/21/2020 Target Resolution Date: 01/18/2021 Goal Status: Active Ulcer/skin breakdown will have a volume reduction of 50% by week 8 Date Initiated: 12/21/2020 Target Resolution Date: 02/18/2021 Goal Status: Active MONTREAL, STEIDLE ([id number]) Interventions: Assess patient/caregiver ability to obtain necessary supplies Assess patient/caregiver ability to perform ulcer/skin care regimen upon admission and as needed Assess ulceration(s) every visit Provide education on ulcer and skin care Treatment Activities: Referred to DME Kyren Knick for dressing supplies : 12/21/2020 Skin care regimen initiated : 12/21/2020 Topical wound management initiated : 12/21/2020 Notes: Electronic Signature(s) Signed: 12/21/2020 4:36:53 PM By: Phillis Haggis, Dondra Prader RN Entered By: Phillis Haggis, Dondra Prader on 12/21/2020 14:10:18 Debbora Dus ([id number]) -------------------------------------------------------------------------------- Pain Assessment Details Patient Name: Debbora Dus Date of Service: 12/21/2020 1:00 PM Medical Record Number: [id number] Patient Account Number: [id number] Date of Birth/Sex: 11-18-1951 (70 y.o. M) Treating RN: Yevonne Pax Primary Care Ashly Goethe: Marny Lowenstein Other Clinician: Referring Sirius Woodford: Marny Lowenstein Treating Michale Emmerich/Extender: Rowan Blase in Treatment: 0 Active Problems Location of Pain Severity and Description of Pain Patient Has Paino Yes Site Locations Pain Location: Pain in Ulcers Rate the pain. Current Pain Level: 3 Pain Management and Medication Current Pain Management: Electronic Signature(s) Signed: 12/22/2020 9:59:20 AM By: Yevonne Pax RN Entered By: Yevonne Pax  on 12/21/2020 13:24:56 KOLEMAN, MARLING ([id number]) -------------------------------------------------------------------------------- Patient/Caregiver  Education Details Patient Name: [name] Date of Service: 12/21/2020 1:00 PM Medical Record Number: [id number] Patient Account Number: [id number] Date of Birth/Gender: October 19, 1951 (69 y.o. M) Treating RN: Dolan Amen Primary Care Physician: Barney Drain Other Clinician: Referring Physician: Barney Drain Treating Physician/Extender: Skipper Cliche in Treatment: 0 Education Assessment Education Provided To: Caregiver Education Topics Provided Wound Debridement: Methods: Explain/Verbal Responses: State content correctly Wound/Skin Impairment: Methods: Explain/Verbal Responses: State content correctly Electronic Signature(s) Signed: 12/21/2020 4:36:53 PM By: Georges Mouse, Minus Breeding RN Entered By: Georges Mouse, Minus Breeding on 12/21/2020 14:22:08 [name] ([id number]) -------------------------------------------------------------------------------- Wound Assessment Details Patient Name: [name] Date of Service: 12/21/2020 1:00 PM Medical Record Number: [id number] Patient Account Number: [id number] Date of Birth/Sex: 01/03/1951 (69 y.o. M) Treating RN: Dolan Amen Primary Care Maclovia Uher: Barney Drain Other Clinician: Referring Gerrett Loman: Barney Drain Treating Gabreil Yonkers/Extender: Skipper Cliche in Treatment: 0 Wound Status Wound Number: 1 Primary Etiology: Trauma, Other Wound Location: Right, Medial Lower Leg Secondary Venous Leg Ulcer Etiology: Wounding Event: Trauma Wound Status: Open Date Acquired: 10/20/2020 Comorbid History: Hypertension, History of pressure wounds, Weeks Of Treatment: 0 Neuropathy Clustered Wound: No Wound Measurements Length: (cm) 9 Width: (cm) 8 Depth: (cm) 0.2 Area: (cm) 56.549 Volume: (cm) 11.31 % Reduction in Area: -242.9% % Reduction in Volume: -242.8% Epithelialization: None Tunneling: No Undermining: No Wound Description Classification: Full Thickness Without Exposed Support Structures Wound Margin:  Flat and Intact Exudate Amount: Large Exudate Type: Serous Exudate Color: amber Foul Odor After Cleansing: No Slough/Fibrino Yes Wound Bed Granulation Amount: None Present (0%) Exposed Structure Necrotic Amount: Large (67-100%) Fascia Exposed: No Fat Layer (Subcutaneous Tissue) Exposed: Yes Tendon Exposed: No Muscle Exposed: No Joint Exposed: No Bone Exposed: No Treatment Notes Wound #1 (Lower Leg) Wound Laterality: Right, Medial Cleanser Normal Saline Discharge Instruction: Wash your hands with soap and water. Remove old dressing, discard into plastic bag and place into trash. Cleanse the wound with Normal Saline prior to applying a clean dressing using gauze sponges, not tissues or cotton balls. Do not scrub or use excessive force. Pat dry using gauze sponges, not tissue or cotton balls. Soap and Water Discharge Instruction: Gently cleanse wound with antibacterial soap, rinse and pat dry prior to dressing wounds Peri-Wound Care Topical Primary Dressing Hydrofera Blue Ready Transfer Foam, 4x5 (in/in) Discharge Instruction: Apply Hydrofera Blue Ready to wound bed as directed Secondary Dressing Xtrasorb Large 6x9 (in/in) Discharge Instruction: Apply to wound as directed. Do not cut. ANGAD, NABERS ([id number]) Secured With Compression Wrap Profore Lite LF 3 Multilayer Compression Bandaging System Discharge Instruction: Apply 3 multi-layer wrap as prescribed. Unna w/Calamine, 4x10 (in/yd) Discharge Instruction: Apply unna paste -3 finger-widths below knee to anchor compression wrap in place. Compression Stockings Add-Ons Electronic Signature(s) Signed: 12/21/2020 4:36:53 PM By: Georges Mouse, Minus Breeding RN Entered By: Georges Mouse, Minus Breeding on 12/21/2020 14:12:29 [name] ([id number]) -------------------------------------------------------------------------------- Vitals Details Patient Name: [name] Date of Service: 12/21/2020 1:00 PM Medical Record  Number: [id number] Patient Account Number: [id number] Date of Birth/Sex: [birth year] (69 y.o. M) Treating RN: Carlene Coria Primary Care Jeriann Sayres: Barney Drain Other Clinician: Referring Kirstine Jacquin: Barney Drain Treating Larrell Rapozo/Extender: Skipper Cliche in Treatment: 0 Vital Signs Time Taken: 13:24 Temperature (F): 97.7 Height (in): 67 Pulse (bpm): 76 Weight (lbs): 170 Respiratory Rate (breaths/min): 18 Body Mass Index (BMI): 26.6 Blood Pressure (mmHg): 123/82 Reference Range: 80 - 120 mg / dl Electronic Signature(s) Signed: 12/22/2020 9:59:20 AM By: Dolores Lory,  Carrie RN Entered By: Carlene Coria on 12/21/2020 13:25:34

## 2020-12-24 LAB — AEROBIC CULTURE W GRAM STAIN (SUPERFICIAL SPECIMEN): Gram Stain: NONE SEEN

## 2020-12-28 ENCOUNTER — Encounter: Payer: Medicare (Managed Care) | Admitting: Physician Assistant

## 2020-12-28 ENCOUNTER — Other Ambulatory Visit: Payer: Self-pay

## 2020-12-28 NOTE — Progress Notes (Addendum)
PARSA, RICKETT ([id number]) Visit Report for 12/28/2020 Chief Complaint Document Details Patient Name: [name], [name] Date of Service: 12/28/2020 1:30 PM Medical Record Number: [id number] Patient Account Number: [id number] Date of Birth/Sex: Nov 17, 1951 (70 y.o. M) Treating RN: Dolan Amen Primary Care Provider: Barney Drain Other Clinician: Referring Provider: Barney Drain Treating Provider/Extender: Skipper Cliche in Treatment: 1 Information Obtained from: Patient Chief Complaint Right LE Ulcer Electronic Signature(s) Signed: 12/28/2020 5:15:08 PM By: Worthy Keeler PA-C Entered By: Worthy Keeler on 12/28/2020 17:15:07 [name] ([id number]) -------------------------------------------------------------------------------- HPI Details Patient Name: [name] Date of Service: 12/28/2020 1:30 PM Medical Record Number: [id number] Patient Account Number: [id number] Date of Birth/Sex: 16-Jan-1951 (70 y.o. M) Treating RN: Dolan Amen Primary Care Provider: Barney Drain Other Clinician: Referring Provider: Barney Drain Treating Provider/Extender: Skipper Cliche in Treatment: 1 History of Present Illness HPI Description: 12/21/2020 upon evaluation today patient appears to be doing somewhat poorly in regard to his right lower extremity. He does have some erythema and is also having issues with quite a bit of drainage currently from a wound that he tells me has been present since about 2 months ago when he initially had an injury that led to this wound beginning. He does have chronic venous stasis and is secondary to this I think it has been more difficult to heal this wound than normal. With that being said the patient tells me that this tends to drain a lot down onto his feet. He is also had some issues with fungal infection he is currently on miconazole powder for that. Fortunately there is no signs of active infection systemically at this time. No  fevers, chills, nausea, vomiting, or diarrhea. He does have pain but again this is more to the posterior aspect not really more lateral or anterior. There is evidence that he may have some local infection due to erythema and warmth surrounding the wound post debridement I do want to obtain a wound culture to evaluate and see if there is anything indeed of concern here. He did have arterial studies which revealed that he had a ABI of 0.91 on the right with a TBI of 0.76 in the left his ABI was 1.18 with a TBI of 0.73. He has good blood flow here. In regard to his x-ray that was taken on 11/30/2020 there was no evidence of osseous abnormality. The arterial study was actually performed on 12/03/2020. The patient was hospitalized from 11/30/2020 through 12/02/2020 during that time he was on IV antibiotics including vancomycin and cefepime. 12/28/2020 upon evaluation today patient appears to be doing somewhat poorly in regard to his legs currently is having a lot of drainage unfortunately which again he was not actually being wrapped with a 3 layer compression wrap after this was changed at pace. Subsequently I think that the patient does need to have ongoing compression therapy. Right now that means that were doing the compression wrap later he will need compression stockings. Either way though I think that he cannot just have a border foam dressing on this which was what was on today that is not sufficient to catch any of the drainage and to be honest I think this can lead to infection and greater issues here for him. The wound is pretty much about circumferential at this point a weeping area included. Electronic Signature(s) Signed: 12/28/2020 5:15:17 PM By: Worthy Keeler PA-C Entered By: Worthy Keeler on 12/28/2020 17:15:16 [name] ([id number]) -------------------------------------------------------------------------------- Physical Exam Details  Patient Name: [name], [name] Date of  Service: 12/28/2020 1:30 PM Medical Record Number: [id number] Patient Account Number: [id number] Date of Birth/Sex: Nov 16, 1951 (70 y.o. M) Treating RN: Dolan Amen Primary Care Provider: Barney Drain Other Clinician: Referring Provider: Barney Drain Treating Provider/Extender: Skipper Cliche in Treatment: 1 Constitutional Well-nourished and well-hydrated in no acute distress. Respiratory normal breathing without difficulty. Psychiatric this patient is able to make decisions and demonstrates good insight into disease process. Alert and Oriented x 3. pleasant and cooperative. Notes Upon inspection patient's wound bed actually showed signs of good granulation and some areas he had slough and other areas it was very painful pretty much across the board. Is also still warm to touch and coupled with the fact that he does appear to have some infection based on the culture results which I reviewed as well I am going to go ahead and write him a prescription for clindamycin as I am not sure that he had sufficient treatment previously as he was only apparently given a few days of this and possibly even at the 150 mg amount which is probably a bit too low considering the infection he has currently. Electronic Signature(s) Signed: 12/28/2020 5:15:51 PM By: Worthy Keeler PA-C Entered By: Worthy Keeler on 12/28/2020 17:15:51 [name] ([id number]) -------------------------------------------------------------------------------- Physician Orders Details Patient Name: [name] Date of Service: 12/28/2020 1:30 PM Medical Record Number: [id number] Patient Account Number: [id number] Date of Birth/Sex: [birth year] (70 y.o. 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 o Nurse Visit as needed [place] [facility] [place] for wound care. May utilize formulary equivalent dressing for wound treatment orders unless otherwise specified. Home Health Nurse may visit PRN to address patientos wound care needs. - Change dressing 2 x a week- Tuesday/Friday o Scheduled days for dressing changes to be completed; exception, patient has scheduled wound care visit that day. o **Please direct any NON-WOUND related issues/requests for orders to patient's Primary Care Physician. **If current dressing causes regression in wound condition, may D/C ordered dressing product/s and apply Normal Saline Moist Dressing daily until next [place] or Other MD appointment. **Notify [facility] of regression in wound condition at [phone]. Bathing/ Shower/ Hygiene Wound #1 Right,Medial Lower Leg o May shower with wound dressing protected with water repellent cover or cast protector. Anesthetic (Use 'Patient Medications' Section for Anesthetic Order Entry) Wound #1 Right,Medial Lower Leg o Lidocaine applied to wound bed o Benzocaine applied to wound bed. Edema Control - Lymphedema / Segmental Compressive Device / Other Wound #1 Right,Medial Lower Leg o Optional: One layer of unna paste to top of compression wrap (to act as an anchor). o 3 Layer Compression System for Lymphedema. o Elevate legs to the level of the heart and pump ankles as often as possible o  Elevate leg(s) parallel to the floor when sitting. Wound Treatment Wound #1 - Lower Leg Wound Laterality: Right, Medial Cleanser: Soap and Water 2  x Per Week/30 Days Discharge Instructions: Gently cleanse wound with antibacterial soap, rinse and pat dry prior to dressing wounds Cleanser: Normal Saline 2 x Per Week/30 Days Discharge Instructions: Wash your hands with soap and water. Remove old dressing, discard into plastic bag and place into trash. Cleanse the wound with Normal Saline prior to applying a clean dressing using gauze sponges, not tissues or cotton balls. Do not scrub or use excessive force. Pat dry using gauze sponges, not tissue or cotton balls. Primary Dressing: Hydrofera Blue Ready Transfer Foam, 4x5 (in/in) 2 x Per Week/30 Days [name], [name] ([id number]) Discharge Instructions: Apply Hydrofera Blue Ready to wound bed as directed Secondary Dressing: Xtrasorb Large 6x9 (in/in) 2 x Per Week/30 Days Discharge Instructions: Apply to wound as directed. Do not cut. Compression Wrap: Profore Lite LF 3 Multilayer Compression Bandaging System 2 x Per Week/30 Days Discharge Instructions: Apply 3 multi-layer wrap as prescribed. Compression Wrap: Unna w/Calamine, 4x10 (in/yd) 2 x Per Week/30 Days Discharge Instructions: Apply unna paste -3 finger-widths below knee to anchor compression wrap in place. Electronic Signature(s) Signed: 12/28/2020 6:05:44 PM By: Worthy Keeler PA-C Signed: 12/29/2020 9:44:48 AM By: Carlene Coria RN Entered By: Carlene Coria on 12/28/2020 14:30:38 [name] ([id number]) -------------------------------------------------------------------------------- Problem List Details Patient Name: [name] Date of Service: 12/28/2020 1:30 PM Medical Record Number: [id number] Patient Account Number: [id number] Date of Birth/Sex: Sep 19, 1951 (70 y.o. 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 M) Treating RN: Dolan Amen Primary Care Provider: Barney Drain Other Clinician: Referring Provider: Barney Drain Treating Provider/Extender: Skipper Cliche in Treatment: 1 Subjective Chief Complaint Information obtained from Patient Right LE Ulcer History of Present Illness (HPI) 12/21/2020 upon evaluation today patient appears to be doing somewhat poorly in regard to his right lower extremity. He does have some erythema and is also having issues with quite a bit of drainage currently from a wound that he tells me has been present since about 2 months ago when he initially had an injury that led to this wound beginning. He does have chronic venous stasis and is secondary to this I think it has been more difficult to heal this wound than normal. With that being said the patient tells me that this tends to drain a lot down onto his feet. He is also had some issues with fungal  infection he is currently on miconazole powder for that. Fortunately there is no signs of active infection systemically at this time. No fevers, chills, nausea, vomiting, or diarrhea. He does have pain but again this is more to the posterior aspect not really more lateral or anterior. There is evidence that he may have some local infection due  to erythema and warmth surrounding the wound post debridement I do want to obtain a wound culture to evaluate and see if there is anything indeed of concern here. He did have arterial studies which revealed that he had a ABI of 0.91 on the right with a TBI of 0.76 in the left his ABI was 1.18 with a TBI of 0.73. He has good blood flow here. In regard to his x-ray that was taken on 11/30/2020 there was no evidence of osseous abnormality. The arterial study was actually performed on 12/03/2020. The patient was hospitalized from 11/30/2020 through 12/02/2020 during that time he was on IV antibiotics including vancomycin and cefepime. 12/28/2020 upon evaluation today patient appears to be doing somewhat poorly in regard to his legs currently is having a lot of drainage unfortunately which again he was not actually being wrapped with a 3 layer compression wrap after this was changed at [facility]. Subsequently I think that the patient does need to have ongoing compression therapy. Right now that means that were doing the compression wrap later he will need compression stockings. Either way though I think that he cannot just have a border foam dressing on this which was what was on today that is not sufficient to catch any of the drainage and to be honest I think this can lead to infection and greater issues here for him. The wound is pretty much about circumferential at this point a weeping area included. Objective Constitutional Well-nourished and well-hydrated in no acute distress. Vitals Time Taken: 1:46 PM, Height: 67 in, Weight: 170 lbs, BMI: 26.6, Temperature: 97.8 F,  Pulse: 61 bpm, Respiratory Rate: 18 breaths/min, Blood Pressure: 114/69 mmHg. Respiratory normal breathing without difficulty. Psychiatric this patient is able to make decisions and demonstrates good insight into disease process. Alert and Oriented x 3. pleasant and cooperative. General Notes: Upon inspection patient's wound bed actually showed signs of good granulation and some areas he had slough and other areas it was very painful pretty much across the board. Is also still warm to touch and coupled with the fact that he does appear to have some infection based on the culture results which I reviewed as well I am going to go ahead and write him a prescription for clindamycin as I am not sure that he had sufficient treatment previously as he was only apparently given a few days of this and possibly even at the 150 mg amount which is probably a bit too low considering the infection he has currently. Integumentary (Hair, Skin) Wound #1 status is Open. Original cause of wound was Trauma. The wound is located on the Right,Medial Lower Leg. The wound measures 13cm length x 26cm width x 0.1cm depth; 265.465cm^2 area and 26.546cm^3 volume. There is Fat Layer (Subcutaneous Tissue) exposed. There is no tunneling or undermining noted. There is a large amount of serous drainage noted. The wound margin is flat and intact. There is medium (34- 66%) granulation within the wound bed. There is a medium (34-66%) amount of necrotic tissue within the wound bed. 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 Nurse Visit as needed Home  Health: Wound #1 Right,Medial Lower Leg: [place]: - [place] for  wound care. May utilize formulary equivalent dressing for wound treatment orders unless otherwise specified. Home Health Nurse may visit PRN to address patient s wound care needs. - Change dressing 2 x a week-Tuesday/Friday Scheduled days for dressing changes to be completed; exception, patient has scheduled wound care visit that day. **Please direct any NON-WOUND related issues/requests for orders to patient's Primary Care Physician. **If current dressing causes regression in wound condition, may D/C ordered dressing product/s and apply Normal Saline Moist Dressing daily until next [place] or Other MD appointment. **Notify [facility] of regression in wound condition at [phone]. Bathing/ Shower/ Hygiene: Wound #1 Right,Medial Lower Leg: May shower with wound dressing protected with water repellent cover or cast protector. Anesthetic (Use 'Patient Medications' Section for Anesthetic Order Entry): Wound #1 Right,Medial Lower Leg: Lidocaine applied to wound bed Benzocaine applied to wound bed. Edema Control - Lymphedema / Segmental Compressive Device / Other: Wound #1 Right,Medial Lower Leg: Optional: One layer of unna paste to top of compression wrap (to act as an anchor). 3 Layer Compression System for Lymphedema. Elevate legs to the level of the heart and pump ankles as often as possible Elevate leg(s) parallel to the floor when sitting. WOUND #1: - Lower Leg Wound Laterality: Right, Medial Cleanser: Soap and Water 2 x Per Week/30 Days Discharge Instructions: Gently cleanse wound with antibacterial soap, rinse and pat dry prior to dressing wounds Cleanser: Normal Saline 2 x Per Week/30 Days Discharge Instructions: Wash your hands with soap and water. Remove old dressing, discard into plastic bag and place into trash. Cleanse the wound with Normal Saline prior  to applying a clean dressing using gauze sponges, not tissues or cotton balls. Do not scrub or use excessive force. Pat dry using gauze sponges, not tissue or cotton balls. Primary Dressing: Hydrofera Blue Ready Transfer Foam, 4x5 (in/in) 2 x Per Week/30 Days Discharge Instructions: Apply Hydrofera Blue Ready to wound bed as directed Secondary Dressing: Xtrasorb Large 6x9 (in/in) 2 x Per Week/30 Days Discharge Instructions: Apply to wound as directed. Do not cut. Compression Wrap: Profore Lite LF 3 Multilayer Compression Bandaging System 2 x Per Week/30 Days Discharge Instructions: Apply 3 multi-layer wrap as prescribed. Compression Wrap: Unna w/Calamine, 4x10 (in/yd) 2 x Per Week/30 Days Discharge Instructions: Apply unna paste -3 finger-widths below knee to anchor compression wrap in place. 1. I did go ahead and give the patient a prescription for clindamycin 300 mg to be taken 3 times a day for 14 days. 2. I am also can recommend that we should continue with the [facility] followed by Lauraine Rinne and then a 3 layer compression wrap that needs to be changed on Tuesdays and Fridays. If he cannot have this done at [facility] that he should come here for that wrap change. 3. I am also can recommend the patient should be elevating his legs much as possible try to keep edema under control think that still of utmost importance. We will see patient back for reevaluation in 1 week here in the clinic. If anything worsens or changes patient will contact our office for additional recommendations. [name], [name] ([id number]) Electronic Signature(s) Signed: 12/28/2020 5:16:30 PM By: Worthy Keeler PA-C Entered By: Worthy Keeler on 12/28/2020 17:16:30 [name] ([id number]) -------------------------------------------------------------------------------- SuperBill Details Patient Name: [name] Date of Service: 12/28/2020 Medical Record Number: [id number] Patient Account Number:  [id number] Date of Birth/Sex: June 17, 1951 (69 y.o. 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 Unspecified intellectual disabilities Physician Procedures CPT4 Code: [id number] Description: 41287 - WC PHYS LEVEL 3 - EST PT Modifier: Quantity: 1 CPT4 Code: Description: ICD-10 Diagnosis Description S81.801A Unspecified open wound, right lower leg, initial encounter L97.812 Non-pressure chronic ulcer of other part of right lower leg with fat la 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

## 2020-12-29 NOTE — Progress Notes (Signed)
[name] ([id number]) Visit Report for 12/28/2020 Arrival Information Details Patient Name: [name] Date of Service: 12/28/2020 1:30 PM Medical Record Number: [id number] Patient Account Number: [id number] Date of Birth/Sex: [birth year] (70 y.o. M) Treating RN: Carlene Coria Primary Care Provider: Barney Drain Other Clinician: Referring Provider: Barney Drain Treating Provider/Extender: Skipper Cliche in Treatment: 1 Visit Information History Since Last Visit All ordered tests and consults were completed: No Patient Arrived: [name] Added or deleted any medications: No Arrival Time: 13:40 Any new allergies or adverse reactions: No Accompanied By: Education officer, museum Had a fall or experienced change in No Transfer Assistance: None activities of daily living that may affect Patient Identification Verified: Yes risk of falls: Secondary Verification Process Completed: Yes Signs or symptoms of abuse/neglect since last visito No Patient Requires Transmission-Based No Hospitalized since last visit: No Precautions: Implantable device outside of the clinic excluding No Patient Has Alerts: Yes cellular tissue based products placed in the center Patient Alerts: NOT Diabetic since last visit: ABI: R)0.91 L) 1.18 Has Dressing in Place as Prescribed: Yes TBI R) 0.76 L) 0.73 Pain Present Now: Unable to Respond 12/02/2020 [facility] Electronic Signature(s) Signed: 12/29/2020 9:44:48 AM By: Carlene Coria RN Entered By: Carlene Coria on 12/28/2020 13:46:44 [name] ([id number]) -------------------------------------------------------------------------------- Encounter Discharge Information Details Patient Name: [name] Date of Service: 12/28/2020 1:30 PM Medical Record Number: [id number] Patient Account Number: [id number] Date of Birth/Sex: 09-15-1951 (70 y.o. M) Treating RN: Carlene Coria Primary Care Provider: Barney Drain Other Clinician: Referring  Provider: Barney Drain Treating Provider/Extender: Skipper Cliche in Treatment: 1 Encounter Discharge Information Items Discharge Condition: Stable Ambulatory Status: Cane Discharge Destination: Home Transportation: Private Auto Accompanied By: social worker Schedule Follow-up Appointment: Yes Clinical Summary of Care: Patient Declined Electronic Signature(s) Signed: 12/29/2020 9:44:48 AM By: Carlene Coria RN Entered By: Carlene Coria on 12/28/2020 14:53:50 [name] ([id number]) -------------------------------------------------------------------------------- Lower Extremity Assessment Details Patient Name: [name] Date of Service: 12/28/2020 1:30 PM Medical Record Number: [id number] Patient Account Number: [id number] Date of Birth/Sex: 1951/09/16 (70 y.o. M) Treating RN: Carlene Coria Primary Care Provider: Barney Drain Other Clinician: Referring Provider: Barney Drain Treating Provider/Extender: Skipper Cliche in Treatment: 1 Edema Assessment Assessed: [Left: No] [Right: No] [Left: Edema] [Right: :] Calf Left: Right: Point of Measurement: 27 cm From Medial Instep 40 cm Ankle Left: Right: Point of Measurement: 12 cm From Medial Instep 27 cm Electronic Signature(s) Signed: 12/29/2020 9:44:48 AM By: Carlene Coria RN Entered By: Carlene Coria on 12/28/2020 13:59:40 [name] ([id number]) -------------------------------------------------------------------------------- Multi Wound Chart Details Patient Name: [name] Date of Service: 12/28/2020 1:30 PM Medical Record Number: [id number] Patient Account Number: [id number] Date of Birth/Sex: [birth year] (70 y.o. M) Treating RN: Carlene Coria Primary Care Provider: Barney Drain Other Clinician: Referring Provider: Barney Drain Treating Provider/Extender: Skipper Cliche in Treatment: 1 Vital Signs Height(in): 67 Pulse(bpm): 46 Weight(lbs): 170 Blood Pressure(mmHg): 114/69 Body  Mass Index(BMI): 27 Temperature(F): 97.8 Respiratory Rate(breaths/min): 18 Photos: [N/A:N/A] Wound Location: Right, Medial Lower Leg N/A N/A Wounding Event: Trauma N/A N/A Primary Etiology: Trauma, Other N/A N/A Secondary Etiology: Venous Leg Ulcer N/A N/A Comorbid History: Hypertension, History of pressure N/A N/A wounds, Neuropathy Date Acquired: 10/20/2020 N/A N/A Weeks of Treatment: 1 N/A N/A Wound Status: Open N/A N/A Measurements L x W x D (cm) 13x26x0.1 N/A N/A Area (cm) : 265.465 N/A N/A Volume (cm) : 26.546 N/A N/A % Reduction in Area: -369.40% N/A N/A % Reduction in Volume: -134.70%  N/A N/A Classification: Full Thickness Without Exposed N/A N/A Support Structures Exudate Amount: Large N/A N/A Exudate Type: Serous N/A N/A Exudate Color: amber N/A N/A Wound Margin: Flat and Intact N/A N/A Granulation Amount: Medium (34-66%) N/A N/A Necrotic Amount: Medium (34-66%) N/A N/A Exposed Structures: Fat Layer (Subcutaneous Tissue): N/A N/A Yes Fascia: No Tendon: No Muscle: No Joint: No Bone: No Epithelialization: None N/A N/A Treatment Notes Electronic Signature(s) Signed: 12/29/2020 9:44:48 AM By: Carlene Coria RN Entered By: Carlene Coria on 12/28/2020 14:24:46 [name] ([id number]) -------------------------------------------------------------------------------- Multi-Disciplinary Care Plan Details Patient Name: [name] Date of Service: 12/28/2020 1:30 PM Medical Record Number: [id number] Patient Account Number: [id number] Date of Birth/Sex: 1951-02-25 (70 y.o. M) Treating RN: Carlene Coria Primary Care Provider: Barney Drain Other Clinician: Referring Provider: Barney Drain Treating Provider/Extender: Skipper Cliche in Treatment: 1 Active Inactive Orientation to the Wound Care Program Nursing Diagnoses: Knowledge deficit related to the wound healing center program Goals: Patient/caregiver will verbalize understanding of the [place] Program Date Initiated: 12/21/2020 Target Resolution Date: 01/18/2021 Goal Status: Active Interventions: Provide education on orientation to the wound center Notes: Wound/Skin Impairment Nursing Diagnoses: Impaired tissue integrity Knowledge deficit related to ulceration/compromised skin integrity Goals: Patient/caregiver will verbalize understanding of skin care regimen Date Initiated: 12/21/2020 Target Resolution Date: 01/18/2021 Goal Status: Active Ulcer/skin breakdown will have a volume reduction of 30% by week 4 Date Initiated: 12/21/2020 Target Resolution Date: 01/18/2021 Goal Status: Active Ulcer/skin breakdown will have a volume reduction of 50% by week 8 Date Initiated: 12/21/2020 Target Resolution Date: 02/18/2021 Goal Status: Active Interventions: Assess patient/caregiver ability to obtain necessary supplies Assess patient/caregiver ability to perform ulcer/skin care regimen upon admission and as needed Assess ulceration(s) every visit Provide education on ulcer and skin care Treatment Activities: Referred to DME provider for dressing supplies : 12/21/2020 Skin care regimen initiated : 12/21/2020 Topical wound management initiated : 12/21/2020 Notes: Electronic Signature(s) Signed: 12/29/2020 9:44:48 AM By: Carlene Coria RN Entered By: Carlene Coria on 12/28/2020 14:24:37 [name] ([id number]) -------------------------------------------------------------------------------- Pain Assessment Details Patient Name: [name] Date of Service: 12/28/2020 1:30 PM Medical Record Number: [id number] Patient Account Number: [id number] Date of Birth/Sex: 02-Nov-1951 (69 y.o. M) Treating RN: Carlene Coria Primary Care Provider: Barney Drain Other Clinician: Referring Provider: Barney Drain Treating Provider/Extender: Skipper Cliche in Treatment: 1 Active Problems Location of Pain Severity and Description of Pain Patient Has Paino No Site Locations Pain  Management and Medication Current Pain Management: Electronic Signature(s) Signed: 12/29/2020 9:44:48 AM By: Carlene Coria RN Entered By: Carlene Coria on 12/28/2020 13:47:14 [name] ([id number]) -------------------------------------------------------------------------------- Wound Assessment Details Patient Name: [name] Date of Service: 12/28/2020 1:30 PM Medical Record Number: [id number] Patient Account Number: [id number] Date of Birth/Sex: [birth year] (69 y.o. M) Treating RN: Carlene Coria Primary Care Provider: Barney Drain Other Clinician: Referring Provider: Barney Drain Treating Provider/Extender: Skipper Cliche in Treatment: 1 Wound Status Wound Number: 1 Primary Etiology: Trauma, Other Wound Location: Right, Medial Lower Leg Secondary Venous Leg Ulcer Etiology: Wounding Event: Trauma Wound Status: Open Date Acquired: 10/20/2020 Comorbid History: Hypertension, History of pressure wounds, Weeks Of Treatment: 1 Neuropathy Clustered Wound: No Photos Wound Measurements Length: (cm) 13 Width: (cm) 26 Depth: (cm) 0.1 Area: (cm) 265.465 Volume: (cm) 26.546 % Reduction in Area: -369.4% % Reduction in Volume: -134.7% Epithelialization: None Tunneling: No Undermining: No Wound Description Classification: Full Thickness Without Exposed Support Structures Wound Margin: Flat and Intact Exudate Amount: Large Exudate Type: Serous Exudate  Color: amber Foul Odor After Cleansing: No Slough/Fibrino Yes Wound Bed Granulation Amount: Medium (34-66%) Exposed Structure Necrotic Amount: Medium (34-66%) Fascia Exposed: No Fat Layer (Subcutaneous Tissue) Exposed: Yes Tendon Exposed: No Muscle Exposed: No Joint Exposed: No Bone Exposed: No Treatment Notes Wound #1 (Lower Leg) Wound Laterality: Right, Medial Cleanser Soap and Water Discharge Instruction: Gently cleanse wound with antibacterial soap, rinse and pat dry prior to dressing wounds Normal  Saline JAISHAUN, MCNAB ([id number]) Discharge Instruction: Wash your hands with soap and water. Remove old dressing, discard into plastic bag and place into trash. Cleanse the wound with Normal Saline prior to applying a clean dressing using gauze sponges, not tissues or cotton balls. Do not scrub or use excessive force. Pat dry using gauze sponges, not tissue or cotton balls. Peri-Wound Care Topical Primary Dressing Hydrofera Blue Ready Transfer Foam, 4x5 (in/in) Discharge Instruction: Apply Hydrofera Blue Ready to wound bed as directed Secondary Dressing Xtrasorb Large 6x9 (in/in) Discharge Instruction: Apply to wound as directed. Do not cut. Secured With Compression Wrap Profore Lite LF 3 Multilayer Compression Bandaging System Discharge Instruction: Apply 3 multi-layer wrap as prescribed. Unna w/Calamine, 4x10 (in/yd) Discharge Instruction: Apply unna paste -3 finger-widths below knee to anchor compression wrap in place. Compression Stockings Add-Ons Electronic Signature(s) Signed: 12/29/2020 9:44:48 AM By: Carlene Coria RN Entered By: Carlene Coria on 12/28/2020 13:58:25 [name] ([id number]) -------------------------------------------------------------------------------- Vitals Details Patient Name: [name] Date of Service: 12/28/2020 1:30 PM Medical Record Number: [id number] Patient Account Number: [id number] Date of Birth/Sex: Sep 16, 1951 (69 y.o. M) Treating RN: Carlene Coria Primary Care Provider: Barney Drain Other Clinician: Referring Provider: Barney Drain Treating Provider/Extender: Skipper Cliche in Treatment: 1 Vital Signs Time Taken: 13:46 Temperature (F): 97.8 Height (in): 67 Pulse (bpm): 61 Weight (lbs): 170 Respiratory Rate (breaths/min): 18 Body Mass Index (BMI): 26.6 Blood Pressure (mmHg): 114/69 Reference Range: 80 - 120 mg / dl Electronic Signature(s) Signed: 12/29/2020 9:44:48 AM By: Carlene Coria RN Entered By: Carlene Coria  on 12/28/2020 13:47:07

## 2021-01-04 ENCOUNTER — Encounter: Payer: Medicare (Managed Care) | Admitting: Physician Assistant

## 2021-01-04 ENCOUNTER — Other Ambulatory Visit: Payer: Self-pay

## 2021-01-04 DIAGNOSIS — S81801A Unspecified open wound, right lower leg, initial encounter: Secondary | ICD-10-CM | POA: Diagnosis not present

## 2021-01-04 NOTE — Progress Notes (Signed)
GARRON, ELINE ([id number]) Visit Report for 01/04/2021 Arrival Information Details Patient Name: [name], [name] Date of Service: 01/04/2021 1:30 PM Medical Record Number: [id number] Patient Account Number: [id number] Date of Birth/Sex: 11-29-1950 (69 y.o. M) Treating RN: Cornell Barman Primary Care Teodora Baumgarten: Barney Drain Other Clinician: Referring Niyonna Betsill: Barney Drain Treating Omri Bertran/Extender: Skipper Cliche in Treatment: 2 Visit Information History Since Last Visit Pain Present Now: Yes Patient Arrived: Cane Arrival Time: 13:51 Accompanied By: case worker Transfer Assistance: None Patient Identification Verified: Yes Secondary Verification Process Completed: Yes Patient Requires Transmission-Based No Precautions: Patient Has Alerts: Yes Patient Alerts: NOT Diabetic ABI: R)0.91 L) 1.18 TBI R) 0.76 L) 0.73 12/02/2020 [facility] Electronic Signature(s) Signed: 01/04/2021 5:31:18 PM By: Gretta Cool, BSN, RN, CWS, Kim RN, BSN Entered By: Gretta Cool, BSN, RN, CWS, Kim on 01/04/2021 13:51:57 [name] ([id number]) -------------------------------------------------------------------------------- Clinic Level of Care Assessment Details Patient Name: [name] Date of Service: 01/04/2021 1:30 PM Medical Record Number: [id number] Patient Account Number: [id number] Date of Birth/Sex: [birth year] (69 y.o. M) Treating RN: Dolan Amen Primary Care Nikaya Nasby: Barney Drain Other Clinician: Referring Athenia Rys: Barney Drain Treating Winni Ehrhard/Extender: Skipper Cliche in Treatment: 2 Clinic Level of Care Assessment Items TOOL 1 Quantity Score []  - Use when EandM and Procedure is performed on INITIAL visit 0 ASSESSMENTS - Nursing Assessment / Reassessment []  - General Physical Exam (combine w/ comprehensive assessment (listed just below) when performed on new 0 pt. evals) []  - 0 Comprehensive Assessment (HX, ROS, Risk Assessments, Wounds Hx, etc.) ASSESSMENTS -  Wound and Skin Assessment / Reassessment []  - Dermatologic / Skin Assessment (not related to wound area) 0 ASSESSMENTS - Ostomy and/or Continence Assessment and Care []  - Incontinence Assessment and Management 0 []  - 0 Ostomy Care Assessment and Management (repouching, etc.) PROCESS - Coordination of Care []  - Simple Patient / Family Education for ongoing care 0 []  - 0 Complex (extensive) Patient / Family Education for ongoing care []  - 0 Staff obtains Programmer, systems, Records, Test Results / Process Orders []  - 0 Staff telephones HHA, Nursing Homes / Clarify orders / etc []  - 0 Routine Transfer to another Facility (non-emergent condition) []  - 0 Routine Hospital Admission (non-emergent condition) []  - 0 New Admissions / Biomedical engineer / Ordering NPWT, Apligraf, etc. []  - 0 Emergency Hospital Admission (emergent condition) PROCESS - Special Needs []  - Pediatric / Minor Patient Management 0 []  - 0 Isolation Patient Management []  - 0 Hearing / Language / Visual special needs []  - 0 Assessment of Community assistance (transportation, D/C planning, etc.) []  - 0 Additional assistance / Altered mentation []  - 0 Support Surface(s) Assessment (bed, cushion, seat, etc.) INTERVENTIONS - Miscellaneous []  - External ear exam 0 []  - 0 Patient Transfer (multiple staff / Civil Service fast streamer / Similar devices) []  - 0 Simple Staple / Suture removal (25 or less) []  - 0 Complex Staple / Suture removal (26 or more) []  - 0 Hypo/Hyperglycemic Management (do not check if billed separately) []  - 0 Ankle / Brachial Index (ABI) - do not check if billed separately Has the patient been seen at the hospital within the last three years: Yes Total Score: 0 Level Of Care: ____ [name] ([id number]) Electronic Signature(s) Signed: 01/04/2021 5:14:05 PM By: Georges Mouse, Minus Breeding RN Entered By: Georges Mouse, Kenia on 01/04/2021 14:38:49 [name]  ([id number]) -------------------------------------------------------------------------------- Compression Therapy Details Patient Name: [name] Date of Service: 01/04/2021 1:30 PM Medical Record Number: [id number] Patient Account Number: [id number] Date of  Birth/Sex: [birth year] (70 y.o. M) Treating RN: Dolan Amen Primary Care Antony Sian: Barney Drain Other Clinician: Referring Markevius Trombetta: Barney Drain Treating Ronnica Dreese/Extender: Skipper Cliche in Treatment: 2 Compression Therapy Performed for Wound Assessment: Wound #1 Right,Medial Lower Leg Performed By: Clinician Dolan Amen, RN Compression Type: Three Layer Pre Treatment ABI: 0.9 Post Procedure Diagnosis Same as Pre-procedure Electronic Signature(s) Signed: 01/04/2021 5:14:05 PM By: Georges Mouse, Minus Breeding RN Entered By: Georges Mouse, Kenia on 01/04/2021 14:40:25 [name] ([id number]) -------------------------------------------------------------------------------- Encounter Discharge Information Details Patient Name: [name] Date of Service: 01/04/2021 1:30 PM Medical Record Number: [id number] Patient Account Number: [id number] Date of Birth/Sex: 1951/07/19 (69 y.o. M) Treating RN: Dolan Amen Primary Care Delmon Andrada: Barney Drain Other Clinician: Referring Mikaeel Petrow: Barney Drain Treating Tynetta Bachmann/Extender: Skipper Cliche in Treatment: 2 Encounter Discharge Information Items Discharge Condition: Stable Ambulatory Status: Cane Discharge Destination: Home Transportation: Private Auto Accompanied By: Lieutenant Diego worker Schedule Follow-up Appointment: Yes Clinical Summary of Care: Electronic Signature(s) Signed: 01/04/2021 5:14:05 PM By: Georges Mouse, Minus Breeding RN Entered By: Georges Mouse, Minus Breeding on 01/04/2021 14:41:25 [name] ([id number]) -------------------------------------------------------------------------------- Lower Extremity Assessment Details Patient Name:  [name] Date of Service: 01/04/2021 1:30 PM Medical Record Number: [id number] Patient Account Number: [id number] Date of Birth/Sex: 1951/06/23 (69 y.o. M) Treating RN: Cornell Barman Primary Care Teja Judice: Barney Drain Other Clinician: Referring Sanyah Molnar: Barney Drain Treating Adelard Sanon/Extender: Skipper Cliche in Treatment: 2 Edema Assessment Assessed: [Left: No] [Right: No] [Left: Edema] [Right: :] Calf Left: Right: Point of Measurement: 27 cm From Medial Instep 34 cm Ankle Left: Right: Point of Measurement: 12 cm From Medial Instep 27 cm Vascular Assessment Pulses: Dorsalis Pedis Palpable: [Right:Yes] Electronic Signature(s) Signed: 01/04/2021 5:31:18 PM By: Gretta Cool, BSN, RN, CWS, Kim RN, BSN Entered By: Gretta Cool, BSN, RN, CWS, Kim on 01/04/2021 14:04:13 [name] ([id number]) -------------------------------------------------------------------------------- Multi Wound Chart Details Patient Name: [name] Date of Service: 01/04/2021 1:30 PM Medical Record Number: [id number] Patient Account Number: [id number] Date of Birth/Sex: [birth year] (69 y.o. M) Treating RN: Dolan Amen Primary Care Jaylani Mcguinn: Barney Drain Other Clinician: Referring Keldric Poyer: Barney Drain Treating Lisett Dirusso/Extender: Skipper Cliche in Treatment: 2 Vital Signs Height(in): 67 Pulse(bpm): 67 Weight(lbs): 170 Blood Pressure(mmHg): 112/66 Body Mass Index(BMI): 27 Temperature(F): 97.6 Respiratory Rate(breaths/min): 18 Photos: [1:No Photos] [N/A:N/A] Wound Location: [1:Right, Medial Lower Leg] [N/A:N/A] Wounding Event: [1:Trauma] [N/A:N/A] Primary Etiology: [1:Trauma, Other] [N/A:N/A] Secondary Etiology: [1:Venous Leg Ulcer] [N/A:N/A] Date Acquired: [1:10/20/2020] [N/A:N/A] Weeks of Treatment: [1:2] [N/A:N/A] Wound Status: [1:Open] [N/A:N/A] Measurements L x W x D (cm) [1:14.5x26x0.2] [N/A:N/A] Area (cm) : [4:580.998] [N/A:N/A] Volume (cm) : [1:59.219]  [N/A:N/A] % Reduction in Area: [1:-423.60%] [N/A:N/A] % Reduction in Volume: [1:-423.60%] [N/A:N/A] Classification: [1:Full Thickness Without Exposed Support Structures] [N/A:N/A] Treatment Notes Electronic Signature(s) Signed: 01/04/2021 5:14:05 PM By: Georges Mouse, Minus Breeding RN Entered By: Georges Mouse, Minus Breeding on 01/04/2021 14:33:13 [name] ([id number]) -------------------------------------------------------------------------------- Gordon Details Patient Name: [name] Date of Service: 01/04/2021 1:30 PM Medical Record Number: [id number] Patient Account Number: [id number] Date of Birth/Sex: 1951-03-17 (69 y.o. M) Treating RN: Dolan Amen Primary Care Yonathan Perrow: Barney Drain Other Clinician: Referring Monroe Toure: Barney Drain Treating Alanson Hausmann/Extender: Skipper Cliche in Treatment: 2 Active Inactive Wound/Skin Impairment Nursing Diagnoses: Impaired tissue integrity Knowledge deficit related to ulceration/compromised skin integrity Goals: Patient/caregiver will verbalize understanding of skin care regimen Date Initiated: 12/21/2020 Target Resolution Date: 01/18/2021 Goal Status: Active Ulcer/skin breakdown will have a volume reduction of 30% by week 4 Date Initiated: 12/21/2020 Target Resolution Date: 01/18/2021  Goal Status: Active Ulcer/skin breakdown will have a volume reduction of 50% by week 8 Date Initiated: 12/21/2020 Target Resolution Date: 02/18/2021 Goal Status: Active Interventions: Assess patient/caregiver ability to obtain necessary supplies Assess patient/caregiver ability to perform ulcer/skin care regimen upon admission and as needed Assess ulceration(s) every visit Provide education on ulcer and skin care Treatment Activities: Referred to DME Delma Villalva for dressing supplies : 12/21/2020 Skin care regimen initiated : 12/21/2020 Topical wound management initiated : 12/21/2020 Notes: Electronic Signature(s) Signed: 01/04/2021  5:14:05 PM By: Georges Mouse, Minus Breeding RN Entered By: Georges Mouse, Minus Breeding on 01/04/2021 14:33:02 [name] ([id number]) -------------------------------------------------------------------------------- Pain Assessment Details Patient Name: [name] Date of Service: 01/04/2021 1:30 PM Medical Record Number: [id number] Patient Account Number: [id number] Date of Birth/Sex: 1951-05-12 (69 y.o. M) Treating RN: Cornell Barman Primary Care Stefan Karen: Barney Drain Other Clinician: Referring Tuleen Mandelbaum: Barney Drain Treating Pegeen Stiger/Extender: Skipper Cliche in Treatment: 2 Active Problems Location of Pain Severity and Description of Pain Patient Has Paino Yes Site Locations Pain Management and Medication Current Pain Management: Notes Patient states pain in the back of leg hurts bad. Electronic Signature(s) Signed: 01/04/2021 5:31:18 PM By: Gretta Cool, BSN, RN, CWS, Kim RN, BSN Entered By: Gretta Cool, BSN, RN, CWS, Kim on 01/04/2021 13:53:00 [name] ([id number]) -------------------------------------------------------------------------------- Patient/Caregiver Education Details Patient Name: [name] Date of Service: 01/04/2021 1:30 PM Medical Record Number: [id number] Patient Account Number: [id number] Date of Birth/Gender: 1950-12-08 (69 y.o. M) Treating RN: Dolan Amen Primary Care Physician: Barney Drain Other Clinician: Referring Physician: Barney Drain Treating Physician/Extender: Skipper Cliche in Treatment: 2 Education Assessment Education Provided To: Caregiver social worker Education Topics Provided Notes Educated on new antibiotic treatment Electronic Signature(s) Signed: 01/04/2021 5:14:05 PM By: Georges Mouse, Minus Breeding RN Entered By: Georges Mouse, Minus Breeding on 01/04/2021 14:40:54 [name] ([id number]) -------------------------------------------------------------------------------- Wound Assessment Details Patient Name:  [name] Date of Service: 01/04/2021 1:30 PM Medical Record Number: [id number] Patient Account Number: [id number] Date of Birth/Sex: 03/17/1951 (69 y.o. M) Treating RN: Cornell Barman Primary Care Laisha Rau: Barney Drain Other Clinician: Referring Elizabet Schweppe: Barney Drain Treating Daegen Berrocal/Extender: Skipper Cliche in Treatment: 2 Wound Status Wound Number: 1 Primary Etiology: Trauma, Other Wound Location: Right, Medial Lower Leg Secondary Etiology: Venous Leg Ulcer Wounding Event: Trauma Wound Status: Open Date Acquired: 10/20/2020 Weeks Of Treatment: 2 Clustered Wound: No Wound Measurements Length: (cm) 14.5 Width: (cm) 26 Depth: (cm) 0.2 Area: (cm) 296.095 Volume: (cm) 59.219 % Reduction in Area: -423.6% % Reduction in Volume: -423.6% Wound Description Classification: Full Thickness Without Exposed Support Structu res Treatment Notes Wound #1 (Lower Leg) Wound Laterality: Right, Medial Cleanser Soap and Water Discharge Instruction: Gently cleanse wound with antibacterial soap, rinse and pat dry prior to dressing wounds Normal Saline Discharge Instruction: Wash your hands with soap and water. Remove old dressing, discard into plastic bag and place into trash. Cleanse the wound with Normal Saline prior to applying a clean dressing using gauze sponges, not tissues or cotton balls. Do not scrub or use excessive force. Pat dry using gauze sponges, not tissue or cotton balls. Peri-Wound Care Topical Primary Dressing Hydrofera Blue Ready Transfer Foam, 4x5 (in/in) Discharge Instruction: Apply Hydrofera Blue Ready to wound bed as directed Secondary Dressing Xtrasorb Large 6x9 (in/in) Discharge Instruction: Apply to wound as directed. Do not cut. Secured With Compression Wrap Profore Lite LF 3 Multilayer Compression Bandaging System Discharge Instruction: Apply 3 multi-layer wrap as prescribed. Unna w/Calamine, 4x10 (in/yd) Discharge Instruction: Apply unna  paste -3 finger-widths  below knee to anchor compression wrap in place. Compression Stockings Add-Ons Electronic Signature(s) [name], [name] ([id number]) Signed: 01/04/2021 5:31:18 PM By: Gretta Cool, BSN, RN, CWS, Kim RN, BSN Entered By: Gretta Cool, BSN, RN, CWS, Kim on 01/04/2021 14:02:36 [name] ([id number]) -------------------------------------------------------------------------------- Vitals Details Patient Name: [name] Date of Service: 01/04/2021 1:30 PM Medical Record Number: [id number] Patient Account Number: [id number] Date of Birth/Sex: [birth year] (69 y.o. M) Treating RN: Cornell Barman Primary Care Essence Merle: Barney Drain Other Clinician: Referring Frenchie Pribyl: Barney Drain Treating Michaelpaul Apo/Extender: Skipper Cliche in Treatment: 2 Vital Signs Time Taken: 13:52 Temperature (F): 97.6 Height (in): 67 Pulse (bpm): 67 Weight (lbs): 170 Respiratory Rate (breaths/min): 18 Body Mass Index (BMI): 26.6 Blood Pressure (mmHg): 112/66 Reference Range: 80 - 120 mg / dl Electronic Signature(s) Signed: 01/04/2021 5:31:18 PM By: Gretta Cool, BSN, RN, CWS, Kim RN, BSN Entered By: Gretta Cool, BSN, RN, CWS, Kim on 01/04/2021 13:52:20

## 2021-01-04 NOTE — Progress Notes (Addendum)
EDWARDO, WOJNAROWSKI ([id number]) Visit Report for 01/04/2021 Chief Complaint Document Details Patient Name: [name], [name] Date of Service: 01/04/2021 1:30 PM Medical Record Number: [id number] Patient Account Number: [id number] Date of Birth/Sex: [birth year] (70 y.o. M) Treating RN: Dolan Amen Primary Care Provider: Barney Drain Other Clinician: Referring Provider: Barney Drain Treating Provider/Extender: Skipper Cliche in Treatment: 2 Information Obtained from: Patient Chief Complaint Right LE Ulcer Electronic Signature(s) Signed: 01/04/2021 2:30:02 PM By: Worthy Keeler PA-C Entered By: Worthy Keeler on 01/04/2021 14:30:02 [name] ([id number]) -------------------------------------------------------------------------------- HPI Details Patient Name: [name] Date of Service: 01/04/2021 1:30 PM Medical Record Number: [id number] Patient Account Number: [id number] Date of Birth/Sex: 1951/06/04 (70 y.o. M) Treating RN: Dolan Amen Primary Care Provider: Barney Drain Other Clinician: Referring Provider: Barney Drain Treating Provider/Extender: Skipper Cliche in Treatment: 2 History of Present Illness HPI Description: 12/21/2020 upon evaluation today patient appears to be doing somewhat poorly in regard to his right lower extremity. He does have some erythema and is also having issues with quite a bit of drainage currently from a wound that he tells me has been present since about 2 months ago when he initially had an injury that led to this wound beginning. He does have chronic venous stasis and is secondary to this I think it has been more difficult to heal this wound than normal. With that being said the patient tells me that this tends to drain a lot down onto his feet. He is also had some issues with fungal infection he is currently on miconazole powder for that. Fortunately there is no signs of active infection systemically at this time. No  fevers, chills, nausea, vomiting, or diarrhea. He does have pain but again this is more to the posterior aspect not really more lateral or anterior. There is evidence that he may have some local infection due to erythema and warmth surrounding the wound post debridement I do want to obtain a wound culture to evaluate and see if there is anything indeed of concern here. He did have arterial studies which revealed that he had a ABI of 0.91 on the right with a TBI of 0.76 in the left his ABI was 1.18 with a TBI of 0.73. He has good blood flow here. In regard to his x-ray that was taken on 11/30/2020 there was no evidence of osseous abnormality. The arterial study was actually performed on 12/03/2020. The patient was hospitalized from 11/30/2020 through 12/02/2020 during that time he was on IV antibiotics including vancomycin and cefepime. 12/28/2020 upon evaluation today patient appears to be doing somewhat poorly in regard to his legs currently is having a lot of drainage unfortunately which again he was not actually being wrapped with a 3 layer compression wrap after this was changed at pace. Subsequently I think that the patient does need to have ongoing compression therapy. Right now that means that were doing the compression wrap later he will need compression stockings. Either way though I think that he cannot just have a border foam dressing on this which was what was on today that is not sufficient to catch any of the drainage and to be honest I think this can lead to infection and greater issues here for him. The wound is pretty much about circumferential at this point a weeping area included. 01/04/2021 upon evaluation today patient unfortunately is doing quite a bit worse in regard to the overall appearance of his wound from the standpoint of infection. I  did see evidence of bright green drainage consistent with Pseudomonas which I discussed with the patient today as well as his representative from  social services. Nonetheless I believe that we likely need to treat him for Pseudomonas right now the clindamycin he is on is not covering for this. With that being said no sharp debridement was performed today. Of note the patient's Hydrofera Blue dressing was not the ready transfer version and unfortunately did cause this did not drain quite as well as we would like to. Nonetheless I do feel like that the patient would benefit from continuing with the wraps changed out their pace I think they can definitely do a good job and his swelling is under good control the big thing here is that we need to make sure that the dressing is applied appropriately we will contact them just to confirm and make sure that they know the appropriate way to apply the [facility] that they have. Electronic Signature(s) Signed: 01/04/2021 5:38:09 PM By: Worthy Keeler PA-C Entered By: Worthy Keeler on 01/04/2021 17:38:08 [name] ([id number]) -------------------------------------------------------------------------------- Physical Exam Details Patient Name: [name] Date of Service: 01/04/2021 1:30 PM Medical Record Number: [id number] Patient Account Number: [id number] Date of Birth/Sex: June 29, 1951 (70 y.o. M) Treating RN: Dolan Amen Primary Care Provider: Barney Drain Other Clinician: Referring Provider: Barney Drain Treating Provider/Extender: Skipper Cliche in Treatment: 2 Constitutional Well-nourished and well-hydrated in no acute distress. Respiratory normal breathing without difficulty. Psychiatric this patient is able to make decisions and demonstrates good insight into disease process. Alert and Oriented x 3. pleasant and cooperative. Notes Upon inspection patient's wound bed actually showed signs of good granulation at this time. There does not appear to be significant slough buildup there is some slough but at the same time I do believe that the patient likely is  experiencing this because of the fact that his infection is not under good control yet. I am to go ahead and give him a prescription for Levaquin which I think is good to help take care of this based on the drainage and what I see today going on. Electronic Signature(s) Signed: 01/04/2021 5:38:49 PM By: Worthy Keeler PA-C Entered By: Worthy Keeler on 01/04/2021 17:38:49 [name] ([id number]) -------------------------------------------------------------------------------- Physician Orders Details Patient Name: [name] Date of Service: 01/04/2021 1:30 PM Medical Record Number: [id number] Patient Account Number: [id number] Date of Birth/Sex: 08-14-1951 (69 y.o. 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 o Nurse Visit as needed [place] [facility] [place] for wound care. May utilize formulary equivalent dressing for wound treatment orders unless otherwise specified. Home Health Nurse may visit PRN to address patientos wound care needs. - Change dressing 2 x a week- Tuesday/Friday o Scheduled days for dressing changes to be completed; exception, patient has scheduled wound care visit that day. o **Please direct any NON-WOUND related issues/requests for orders to patient's Primary  Care Physician. **If current  dressing causes regression in wound condition, may D/C ordered dressing product/s and apply Normal Saline Moist Dressing daily until next [place] or Other MD appointment. **Notify [facility] of regression in wound condition at [phone]. Bathing/ Shower/ Hygiene Wound #1 Right,Medial Lower Leg o May shower with wound dressing protected with water repellent cover or cast protector. Anesthetic (Use 'Patient Medications' Section for Anesthetic Order Entry) Wound #1 Right,Medial Lower Leg o Lidocaine applied to wound bed o Benzocaine applied to wound bed. Edema Control - Lymphedema / Segmental Compressive Device / Other Wound #1 Right,Medial Lower Leg o Optional: One layer of unna paste to top of compression wrap (to act as an anchor). o 3 Layer Compression System for Lymphedema. o Elevate legs to the level of the heart and pump ankles as often as possible o Elevate leg(s) parallel to the floor when sitting. Medications-Please add to medication list. Wound #1 Right,Medial Lower Leg o P.O. Antibiotics - levaquin 750mg  1 tab PO Wound Treatment Wound #1 - Lower Leg Wound Laterality: Right, Medial Cleanser: Soap and Water 2 x Per Week/30 Days Discharge Instructions: Gently cleanse wound with antibacterial soap, rinse and pat dry prior to dressing wounds Cleanser: Normal Saline 2 x Per Week/30 Days [name], [name] ([id number]) Discharge Instructions: Wash your hands with soap and water. Remove old dressing, discard into plastic bag and place into trash. Cleanse the wound with Normal Saline prior to applying a clean dressing using gauze sponges, not tissues or cotton balls. Do not scrub or use excessive force. Pat dry using gauze sponges, not tissue or cotton balls. Primary Dressing: Hydrofera Blue Ready Transfer Foam, 4x5 (in/in) 2 x Per Week/30 Days Discharge Instructions: Apply Hydrofera Blue Ready to wound bed as  directed Secondary Dressing: Xtrasorb Large 6x9 (in/in) 2 x Per Week/30 Days Discharge Instructions: Apply to wound as directed. Do not cut. Compression Wrap: Profore Lite LF 3 Multilayer Compression Bandaging System 2 x Per Week/30 Days Discharge Instructions: Apply 3 multi-layer wrap as prescribed. Compression Wrap: Unna w/Calamine, 4x10 (in/yd) 2 x Per Week/30 Days Discharge Instructions: Apply unna paste -3 finger-widths below knee to anchor compression wrap in place. Patient Medications Allergies: No Known Drug Allergies Notifications Medication Indication Start End Levaquin 01/04/2021 DOSE 1 - oral 750 mg tablet - 1 tablet oral taken 1 time per day for 14 days Electronic Signature(s) Signed: 01/06/2021 4:58:35 PM By: Worthy Keeler PA-C Entered By: Worthy Keeler on 01/04/2021 14:44:19 [name] ([id number]) -------------------------------------------------------------------------------- Prescription 01/04/2021 Patient Name: [name] Provider: Jeri Cos PA-C Date of Birth: [birth year] NPI#: [id number] Sex: M DEA#: [id number] Phone #: [phone] License #: Patient Address: [facility] [place], [place] [address] [address] [place], [place] [address] [phone] Allergies No Known Drug Allergies Medication Medication: Route: Strength: Form: Levaquin oral 750 mg tablet Class: QUINOLONE ANTIBIOTICS Dose: Frequency / Time: Indication: 1 1 tablet oral taken 1 time per day for 14 days Number of Refills: Number of Units: 0 Fourteen (14) Tablet(s) Generic Substitution: Start Date: End Date: Administered at Facility: Substitution Permitted 6/60/6301 No Note to Pharmacy: Hand Signature: Date(s): Electronic Signature(s) Signed: 01/06/2021 4:58:35 PM By: Worthy Keeler PA-C Entered By: Worthy Keeler on 01/04/2021 14:44:19 [name]  ([id number]) --------------------------------------------------------------------------------  Problem List Details Patient Name: [name] Date of Service: 01/04/2021 1:30 PM Medical Record Number: [id number] Patient Account Number: [id number] Date of Birth/Sex: 01-27-1951 (69  y.o. 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 M) Treating RN: Dolan Amen Primary Care Provider: Barney Drain Other Clinician: Referring Provider: Barney Drain Treating Provider/Extender: Skipper Cliche in Treatment: 2 Subjective Chief Complaint Information obtained from Patient Right LE Ulcer History of Present Illness (HPI) 12/21/2020 upon evaluation today patient appears to be doing somewhat poorly in regard to his right lower extremity. He does have some erythema and is also having issues with quite a bit of drainage  currently from a wound that he tells me has been present since about 2 months ago when he initially had an injury that led to this wound beginning. He does have chronic venous stasis and is secondary to this I think it has been more difficult to heal this wound than normal. With that being said the patient tells me that this tends to drain a lot down onto his feet. He is also had some issues with fungal infection he is currently on miconazole powder for that. Fortunately there is no signs of active infection systemically at this time. No fevers, chills, nausea, vomiting, or diarrhea. He does have pain but again this is more to the posterior aspect not really more lateral or anterior. There is evidence that he may have some local infection due to erythema and warmth surrounding the wound post debridement I do want to obtain a wound culture to evaluate and see if there is anything indeed of concern here. He did have arterial studies which revealed that he had a ABI of 0.91 on the right with a TBI of 0.76 in the left his ABI was 1.18 with a TBI of 0.73. He has good blood flow here. In regard to his x-ray that was taken on 11/30/2020 there was no evidence of osseous abnormality. The arterial study was actually performed on 12/03/2020. The patient was hospitalized from 11/30/2020 through 12/02/2020 during that time he was on IV antibiotics including vancomycin and cefepime. 12/28/2020 upon evaluation today patient appears to be doing somewhat poorly in regard to his legs currently is having a lot of drainage unfortunately which again he was not actually being wrapped with a 3 layer compression wrap after this was changed at [facility]. Subsequently I think that the patient does need to have ongoing compression therapy. Right now that means that were doing the compression wrap later he will need compression stockings. Either way though I think that he cannot just have a border foam dressing on this which was what was on  today that is not sufficient to catch any of the drainage and to be honest I think this can lead to infection and greater issues here for him. The wound is pretty much about  circumferential at this point a weeping area included. 01/04/2021 upon evaluation today patient unfortunately is doing quite a bit worse in regard to the overall appearance of his wound from the standpoint of infection. I did see evidence of bright green drainage consistent with Pseudomonas which I discussed with the patient today as well as his representative from social services. Nonetheless I believe that we likely need to treat him for Pseudomonas right now the clindamycin he is on is not covering for this. With that being said no sharp debridement was performed today. Of note the patient's Hydrofera Blue dressing was not the ready transfer version and unfortunately did cause this did not drain quite as well as we would like to. Nonetheless I do feel like that the patient would benefit from continuing with the wraps changed out their pace I think they can definitely do a good job and his swelling is under good control the big thing here is that we need to make sure that the dressing is applied appropriately we will contact them just to confirm and make sure that they know the appropriate way to apply the [facility] that they have. Objective Constitutional Well-nourished and well-hydrated in no acute distress. Vitals Time Taken: 1:52 PM, Height: 67 in, Weight: 170 lbs, BMI: 26.6, Temperature: 97.6 F, Pulse: 67 bpm, Respiratory Rate: 18 breaths/min, Blood Pressure: 112/66 mmHg. Respiratory normal breathing without difficulty. Psychiatric this patient is able to make decisions and demonstrates good insight into disease process. Alert and Oriented x 3. pleasant and cooperative. General Notes: Upon inspection patient's wound bed actually showed signs of good granulation at this time. There does not appear to  be significant slough buildup there is some slough but at the same time I do believe that the patient likely is experiencing this because of the fact that his infection is not under good control yet. I am to go ahead and give him a prescription for Levaquin which I think is good to help take care of this based on the drainage and what I see today going on. [name], [name] ([id number]) Integumentary (Hair, Skin) Wound #1 status is Open. Original cause of wound was Trauma. The wound is located on the Right,Medial Lower Leg. The wound measures 14.5cm length x 26cm width x 0.2cm depth; 296.095cm^2 area and 59.219cm^3 volume. 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 There was a Three Layer Compression Therapy Procedure with a pre-treatment ABI of 0.9 by Dolan Amen, RN. Post procedure Diagnosis Wound #1: Same as Pre-Procedure Plan Follow-up Appointments: Wound #1 Right,Medial Lower Leg: Return Appointment in 1 week. Nurse Visit as needed Home Health: Wound #1 Right,Medial Lower Leg: [place]: [facility] [place] for wound care. May utilize formulary equivalent dressing for wound treatment orders unless otherwise specified. Home Health Nurse may visit PRN to address patient s wound care needs. - Change dressing 2 x a week-Tuesday/Friday Scheduled days for dressing changes to be completed; exception, patient has scheduled wound care visit that day. **Please direct any NON-WOUND related issues/requests for orders to patient's Primary Care Physician. **If current dressing causes regression in  wound condition, may D/C ordered dressing product/s and apply Normal Saline Moist Dressing daily  until next [facility] or Other MD appointment. **Notify [facility] of regression in wound condition at [phone]. Bathing/ Shower/ Hygiene: Wound #1 Right,Medial Lower Leg: May shower with wound dressing protected with water repellent cover or cast protector. Anesthetic (Use 'Patient Medications' Section for Anesthetic Order Entry): Wound #1 Right,Medial Lower Leg: Lidocaine applied to wound bed Benzocaine applied to wound bed. Edema Control - Lymphedema / Segmental Compressive Device / Other: Wound #1 Right,Medial Lower Leg: Optional: One layer of unna paste to top of compression wrap (to act as an anchor). 3 Layer Compression System for Lymphedema. Elevate legs to the level of the heart and pump ankles as often as possible Elevate leg(s) parallel to the floor when sitting. Medications-Please add to medication list.: Wound #1 Right,Medial Lower Leg: P.O. Antibiotics - levaquin 750mg  1 tab PO The following medication(s) was prescribed: Levaquin oral 750 mg tablet 1 1 tablet oral taken 1 time per day for 14 days starting 01/04/2021 WOUND #1: - Lower Leg Wound Laterality: Right, Medial Cleanser: Soap and Water 2 x Per Week/30 Days Discharge Instructions: Gently cleanse wound with antibacterial soap, rinse and pat dry prior to dressing wounds Cleanser: Normal Saline 2 x Per Week/30 Days Discharge Instructions: Wash your hands with soap and water. Remove old dressing, discard into plastic bag and place into trash. Cleanse the wound with Normal Saline prior to applying a clean dressing using gauze sponges, not tissues or cotton balls. Do not scrub or use excessive force. Pat dry using gauze sponges, not tissue or cotton balls. [name], [name] ([id number]) Primary Dressing: Chrys Racer Ready Transfer Foam, 4x5 (in/in) 2 x Per Week/30 Days Discharge Instructions: Apply  Hydrofera Blue Ready to wound bed as directed Secondary Dressing: Xtrasorb Large 6x9 (in/in) 2 x Per Week/30 Days Discharge Instructions: Apply to wound as directed. Do not cut. Compression Wrap: Profore Lite LF 3 Multilayer Compression Bandaging System 2 x Per Week/30 Days Discharge Instructions: Apply 3 multi-layer wrap as prescribed. Compression Wrap: Unna w/Calamine, 4x10 (in/yd) 2 x Per Week/30 Days Discharge Instructions: Apply unna paste -3 finger-widths below knee to anchor compression wrap in place. 1. Would recommend currently that we going to continue with the wound care measures as before and the patient is in agreement with the plan. This includes the use of the [facility] dressing which I think is still a good option for him. 2. I am also can recommend that we continue with XtraSorb. Because we do have this over top we can contact pace to let them know to cut slits in the [facility] in order to allow this to drain through as the version that they have otherwise not allow for any of the drainage to reach the XtraSorb. 3. I am also can recommend that we continue with three layer compression wrap but seem to be on and doing excellent today. 4. I did go ahead and place the patient on Levaquin today. This is due to the obvious Pseudomonas that was present looking at his drainage. We will see patient back for reevaluation in 1 week here in the clinic. If anything worsens or changes patient will contact our office for additional recommendations. Electronic Signature(s) Signed: 01/04/2021 5:42:42 PM By: Worthy Keeler PA-C Entered By: Worthy Keeler on 01/04/2021 17:42:42 [name] ([id number]) -------------------------------------------------------------------------------- SuperBill Details Patient Name: [name] Date of Service: 01/04/2021 Medical Record Number: [id number] Patient Account Number: [id number] Date of Birth/Sex: 1951-05-16 (69 y.o. 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 Unspecified intellectual disabilities Facility Procedures CPT4 Code: 95747340 Description: (Facility Use Only) [phone] - [facility] RT LEG Modifier: Quantity: 1 Physician Procedures CPT4 Code: 8381840 Description: 37543 - WC PHYS LEVEL 4 - EST PT Modifier: Quantity: 1 CPT4 Code: Description: ICD-10 Diagnosis Description S81.801A Unspecified open wound, right lower leg, initial encounter I87.2 Venous insufficiency (chronic) (peripheral) L97.812 Non-pressure chronic ulcer of other part of right lower leg with fat la Z87.820  Personal history of traumatic brain injury Modifier: yer exposed Quantity: Electronic Signature(s) Signed: 01/04/2021 5:42:59 PM By: Worthy Keeler PA-C Previous Signature: 01/04/2021 5:14:05 PM Version By: Georges Mouse, Minus Breeding RN Entered By: Worthy Keeler on 01/04/2021 17:42:59

## 2021-01-11 ENCOUNTER — Other Ambulatory Visit: Payer: Self-pay

## 2021-01-11 ENCOUNTER — Encounter: Payer: Medicare (Managed Care) | Admitting: Physician Assistant

## 2021-01-11 DIAGNOSIS — S81801A Unspecified open wound, right lower leg, initial encounter: Secondary | ICD-10-CM | POA: Diagnosis not present

## 2021-01-11 NOTE — Progress Notes (Addendum)
[name] ([id number]) Visit Report for 01/11/2021 Chief Complaint Document Details Patient Name: [name], [name] Date of Service: 01/11/2021 1:45 PM Medical Record Number: [id number] Patient Account Number: [id number] Date of Birth/Sex: January 15, 1951 (69 y.o. M) Treating RN: Dolan Amen Primary Care Provider: Barney Drain Other Clinician: Referring Provider: Barney Drain Treating Provider/Extender: Skipper Cliche in Treatment: 3 Information Obtained from: Patient Chief Complaint Right LE Ulcer Electronic Signature(s) Signed: 01/11/2021 2:28:29 PM By: Worthy Keeler PA-C Entered By: Worthy Keeler on 01/11/2021 14:28:28 [name] ([id number]) -------------------------------------------------------------------------------- HPI Details Patient Name: [name] Date of Service: 01/11/2021 1:45 PM Medical Record Number: [id number] Patient Account Number: [id number] Date of Birth/Sex: 1951-01-09 (69 y.o. M) Treating RN: Dolan Amen Primary Care Provider: Barney Drain Other Clinician: Referring Provider: Barney Drain Treating Provider/Extender: Skipper Cliche in Treatment: 3 History of Present Illness HPI Description: 12/21/2020 upon evaluation today patient appears to be doing somewhat poorly in regard to his right lower extremity. He does have some erythema and is also having issues with quite a bit of drainage currently from a wound that he tells me has been present since about 2 months ago when he initially had an injury that led to this wound beginning. He does have chronic venous stasis and is secondary to this I think it has been more difficult to heal this wound than normal. With that being said the patient tells me that this tends to drain a lot down onto his feet. He is also had some issues with fungal infection he is currently on miconazole powder for that. Fortunately there is no signs of active infection systemically at this time. No  fevers, chills, nausea, vomiting, or diarrhea. He does have pain but again this is more to the posterior aspect not really more lateral or anterior. There is evidence that he may have some local infection due to erythema and warmth surrounding the wound post debridement I do want to obtain a wound culture to evaluate and see if there is anything indeed of concern here. He did have arterial studies which revealed that he had a ABI of 0.91 on the right with a TBI of 0.76 in the left his ABI was 1.18 with a TBI of 0.73. He has good blood flow here. In regard to his x-ray that was taken on 11/30/2020 there was no evidence of osseous abnormality. The arterial study was actually performed on 12/03/2020. The patient was hospitalized from 11/30/2020 through 12/02/2020 during that time he was on IV antibiotics including vancomycin and cefepime. 12/28/2020 upon evaluation today patient appears to be doing somewhat poorly in regard to his legs currently is having a lot of drainage unfortunately which again he was not actually being wrapped with a 3 layer compression wrap after this was changed at pace. Subsequently I think that the patient does need to have ongoing compression therapy. Right now that means that were doing the compression wrap later he will need compression stockings. Either way though I think that he cannot just have a border foam dressing on this which was what was on today that is not sufficient to catch any of the drainage and to be honest I think this can lead to infection and greater issues here for him. The wound is pretty much about circumferential at this point a weeping area included. 01/04/2021 upon evaluation today patient unfortunately is doing quite a bit worse in regard to the overall appearance of his wound from the standpoint of infection. I  did see evidence of bright green drainage consistent with Pseudomonas which I discussed with the patient today as well as his representative from  social services. Nonetheless I believe that we likely need to treat him for Pseudomonas right now the clindamycin he is on is not covering for this. With that being said no sharp debridement was performed today. Of note the patient's Hydrofera Blue dressing was not the ready transfer version and unfortunately did cause this did not drain quite as well as we would like to. Nonetheless I do feel like that the patient would benefit from continuing with the wraps changed out their pace I think they can definitely do a good job and his swelling is under good control the big thing here is that we need to make sure that the dressing is applied appropriately we will contact them just to confirm and make sure that they know the appropriate way to apply the [facility] that they have. 01/11/2021 upon evaluation today patient appears to be doing somewhat better in regard to the erythema on his leg at this point. Fortunately there are no signs of active infection at this time. No fevers, chills, nausea, vomiting, or diarrhea. With that being said he has been tolerating the dressing changes without complication and very pleased in that regard. I do think that he seems to be making good progress in general which is good news. I think that the biggest issue I see right now is simply the [facility] that is being used cannot transfer through to the [place] and therefore extracting a lot more fluid against the skin which is causing some irritation as well. I think that we may be able to wrap him here and get things in a better position for him to be honest. Electronic Signature(s) Signed: 01/11/2021 3:14:41 PM By: Worthy Keeler PA-C Entered By: Worthy Keeler on 01/11/2021 15:14:41 [name] ([id number]) -------------------------------------------------------------------------------- Physical Exam Details Patient Name: [name] Date of Service: 01/11/2021 1:45 PM Medical Record Number:  [id number] Patient Account Number: [id number] Date of Birth/Sex: 08/06/1951 (69 y.o. M) Treating RN: Dolan Amen Primary Care Provider: Barney Drain Other Clinician: Referring Provider: Barney Drain Treating Provider/Extender: Skipper Cliche in Treatment: 3 Constitutional Well-nourished and well-hydrated in no acute distress. Respiratory normal breathing without difficulty. Psychiatric this patient is able to make decisions and demonstrates good insight into disease process. Alert and Oriented x 3. pleasant and cooperative. Notes Upon inspection patient's wound bed actually showed signs of fairly good granulation a lot of areas unfortunately he does have some breakdown due to moisture related issues. I think that is really due to the fact that the [facility] that is being utilized will not allow to transfer through to the [employer] therefore the [facility] is really not doing much for him to be honest. Electronic Signature(s) Signed: 01/11/2021 3:15:06 PM By: Worthy Keeler PA-C Entered By: Worthy Keeler on 01/11/2021 15:15:06 [name] ([id number]) -------------------------------------------------------------------------------- Physician Orders Details Patient Name: [name] Date of Service: 01/11/2021 1:45 PM Medical Record Number: [id number] Patient Account Number: [id number] Date of Birth/Sex: Nov 15, 1951 (69 y.o. 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 o Nurse Visit as needed - Nurse visit 01/14/21 Bathing/ Shower/ Hygiene Wound #1 Right,Circumferential Lower Leg o May shower with wound dressing protected with water repellent cover or cast protector. Edema Control - Lymphedema / Segmental Compressive Device / Other Right Lower Extremity o Optional: One layer of unna paste to top of compression wrap (to act as an anchor). o 3 Layer Compression System for Lymphedema. Wound Treatment Wound #1 - Lower Leg Wound Laterality: Right, Circumferential Cleanser: Normal Saline 2 x Per Week/30 Days Discharge Instructions: Wash your hands with soap and water. Remove old dressing, discard into plastic bag and place into trash. Cleanse the wound with Normal Saline prior to applying a clean dressing using gauze sponges, not tissues or cotton balls. Do not scrub or use excessive force. Pat dry using gauze sponges, not tissue or cotton balls. Cleanser: Soap and Water 2 x Per Week/30 Days Discharge Instructions: Gently cleanse wound with antibacterial soap, rinse and pat dry prior to dressing wounds Peri-Wound Care: Desitin Maximum Strength Ointment 4 (oz) 2 x Per Week/30 Days Primary Dressing: Hydrofera Blue Ready Transfer Foam, 4x5 (in/in) 2 x Per Week/30 Days Discharge Instructions: Apply Hydrofera Blue Ready to wound bed as directed Secondary Dressing: Xtrasorb Large 6x9 (in/in) 2 x Per Week/30 Days Discharge Instructions: Apply to wound as directed. Do not cut. Compression Wrap: Profore Lite LF 3 Multilayer Compression Bandaging System 2 x Per Week/30 Days Discharge Instructions: Apply 3 multi-layer wrap as prescribed. Electronic Signature(s) Signed: 01/11/2021 5:02:46 PM By: Georges Mouse, Minus Breeding RN Signed: 01/13/2021 5:47:45 PM By: Worthy Keeler PA-C Entered By: Georges Mouse, Minus Breeding on 01/11/2021 14:35:43 [name] ([id number]) -------------------------------------------------------------------------------- Problem List Details Patient Name: [name] Date of Service: 01/11/2021 1:45 PM Medical Record Number: [id number] Patient Account Number: [id number] Date of Birth/Sex: 1951-09-05 (70 y.o. M) Treating RN: Dolan Amen Primary Care Provider: Barney Drain Other Clinician: Referring Provider: Barney Drain Treating Provider/Extender: Skipper Cliche in Treatment: 3 Active Problems ICD-10 Encounter Code Description Active Date MDM Diagnosis S81.801A Unspecified open wound, right lower leg, initial encounter 12/21/2020 No Yes I87.2 Venous insufficiency (chronic) (peripheral) 12/21/2020 No Yes 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r 12/21/2020 No Yes exposed Z87.820 Personal history of traumatic brain injury 12/21/2020 No Yes F79 Unspecified intellectual disabilities 12/21/2020 No Yes Inactive Problems Resolved Problems Electronic Signature(s) Signed: 01/11/2021 2:28:22 PM By: Worthy Keeler PA-C Entered By: Worthy Keeler on 01/11/2021 14:28:22 [name] ([id number]) -------------------------------------------------------------------------------- Progress Note Details Patient Name: [name] Date of Service: 01/11/2021 1:45 PM Medical Record Number: [id number] Patient Account Number: [id number] Date of Birth/Sex: 09-Mar-1951 (69 y.o. M) Treating RN: Dolan Amen Primary Care Provider: Barney Drain Other Clinician: Referring Provider: Barney Drain Treating Provider/Extender: Skipper Cliche in Treatment: 3 Subjective Chief Complaint Information obtained from Patient Right LE Ulcer History of Present Illness (HPI) 12/21/2020 upon evaluation today patient appears to be doing somewhat poorly in regard to his right lower extremity. He does have some erythema and is also having issues with quite a bit of  drainage currently from a wound that he tells me has been present since about 2 months ago when he initially had an injury that led to this wound beginning. He does have chronic venous stasis and is secondary to this I think  it has been more difficult to heal this wound than normal. With that being said the patient tells me that this tends to drain a lot down onto his feet. He is also had some issues with fungal infection he is currently on miconazole powder for that. Fortunately there is no signs of active infection systemically at this time. No fevers, chills, nausea, vomiting, or diarrhea. He does have pain but again this is more to the posterior aspect not really more lateral or anterior. There is evidence that he may have some local infection due to erythema and warmth surrounding the wound post debridement I do want to obtain a wound culture to evaluate and see if there is anything indeed of concern here. He did have arterial studies which revealed that he had a ABI of 0.91 on the right with a TBI of 0.76 in the left his ABI was 1.18 with a TBI of 0.73. He has good blood flow here. In regard to his x-ray that was taken on 11/30/2020 there was no evidence of osseous abnormality. The arterial study was actually performed on 12/03/2020. The patient was hospitalized from 11/30/2020 through 12/02/2020 during that time he was on IV antibiotics including vancomycin and cefepime. 12/28/2020 upon evaluation today patient appears to be doing somewhat poorly in regard to his legs currently is having a lot of drainage unfortunately which again he was not actually being wrapped with a 3 layer compression wrap after this was changed at pace. Subsequently I think that the patient does need to have ongoing compression therapy. Right now that means that were doing the compression wrap later he will need compression stockings. Either way though I think that he cannot just have a border foam dressing on this which was  what was on today that is not sufficient to catch any of the drainage and to be honest I think this can lead to infection and greater issues here for him. The wound is pretty much about circumferential at this point a weeping area included. 01/04/2021 upon evaluation today patient unfortunately is doing quite a bit worse in regard to the overall appearance of his wound from the standpoint of infection. I did see evidence of bright green drainage consistent with Pseudomonas which I discussed with the patient today as well as his representative from social services. Nonetheless I believe that we likely need to treat him for Pseudomonas right now the clindamycin he is on is not covering for this. With that being said no sharp debridement was performed today. Of note the patient's Hydrofera Blue dressing was not the ready transfer version and unfortunately did cause this did not drain quite as well as we would like to. Nonetheless I do feel like that the patient would benefit from continuing with the wraps changed out their pace I think they can definitely do a good job and his swelling is under good control the big thing here is that we need to make sure that the dressing is applied appropriately we will contact them just to confirm and make sure that they know the appropriate way to apply the [facility] that they have. 01/11/2021 upon evaluation today patient appears to be doing somewhat better in regard to the erythema on his leg at this point. Fortunately there are no signs of active infection at this time. No fevers, chills, nausea, vomiting, or diarrhea. With that being said he has been tolerating the dressing changes without complication and very pleased in that regard. I do think  that he seems to be making good progress in general which is good news. I think that the biggest issue I see right now is simply the [facility] that is being used cannot transfer through to the [place] and  therefore extracting a lot more fluid against the skin which is causing some irritation as well. I think that we may be able to wrap him here and get things in a better position for him to be honest. Objective Constitutional Well-nourished and well-hydrated in no acute distress. Vitals Time Taken: 2:04 PM, Height: 67 in, Weight: 170 lbs, BMI: 26.6, Temperature: 97.9 F, Pulse: 59 bpm, Respiratory Rate: 18 breaths/min, Blood Pressure: 108/68 mmHg. Respiratory normal breathing without difficulty. Psychiatric this patient is able to make decisions and demonstrates good insight into disease process. Alert and Oriented x 3. pleasant and cooperative. JUSTYCE, BABY ([id number]) General Notes: Upon inspection patient's wound bed actually showed signs of fairly good granulation a lot of areas unfortunately he does have some breakdown due to moisture related issues. I think that is really due to the fact that the [facility] that is being utilized will not allow to transfer through to the [employer] therefore the [facility] is really not doing much for him to be honest. Integumentary (Hair, Skin) Wound #1 status is Open. Original cause of wound was Trauma. The date acquired was: 10/20/2020. The wound has been in treatment 3 weeks. The wound is located on the Right,Circumferential Lower Leg. The wound measures 13cm length x 27.2cm width x 0.2cm depth; 277.717cm^2 area and 55.543cm^3 volume. There is Fat Layer (Subcutaneous Tissue) exposed. There is no tunneling or undermining noted. There is a large amount of serous drainage noted. The wound margin is indistinct and nonvisible. There is medium (34-66%) pink, hyper - granulation within the wound bed. There is a medium (34-66%) amount of necrotic tissue within the wound bed including Adherent Slough. 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 There was a Three Layer Compression Therapy Procedure by Dolan Amen, RN. Post procedure Diagnosis Wound #1: Same as Pre-Procedure Notes: pt tolerating wrap well. Plan Follow-up Appointments: Wound #1 Right,Circumferential Lower Leg: Return Appointment in 1 week. Nurse Visit as needed - Nurse visit 01/14/21 Bathing/ Shower/ Hygiene: Wound #1 Right,Circumferential Lower Leg: May shower with wound dressing protected with water repellent cover or cast protector. Edema Control - Lymphedema / Segmental Compressive Device / Other: Optional: One layer of unna paste to top of compression wrap (to act as an anchor). 3 Layer Compression System for Lymphedema. WOUND #1: - Lower Leg Wound Laterality: Right, Circumferential Cleanser: Normal Saline 2 x Per Week/30 Days Discharge Instructions: Wash your hands with soap and water. Remove old dressing, discard into plastic bag and place into trash. Cleanse the wound with Normal Saline prior to applying a clean dressing using gauze sponges, not tissues or cotton balls. Do not scrub or use excessive force. Pat dry using gauze sponges, not tissue or cotton balls. Cleanser: Soap and Water 2 x Per Week/30 Days Discharge Instructions: Gently cleanse wound with antibacterial soap, rinse and pat dry prior to dressing wounds Peri-Wound Care: Desitin Maximum Strength Ointment 4 (oz) 2 x Per Week/30 Days Primary Dressing: Hydrofera Blue  Ready Transfer Foam, 4x5 (in/in) 2 x Per Week/30 Days Discharge Instructions: Apply Hydrofera Blue Ready to wound bed as directed Secondary Dressing: Xtrasorb Large 6x9 (in/in) 2 x Per Week/30 Days Discharge Instructions: Apply to wound as directed. Do not  cut. Compression Wrap: Profore Lite LF 3 Multilayer Compression Bandaging System 2 x Per Week/30 Days Discharge Instructions: Apply 3 multi-layer wrap as prescribed. DAMICO, PARTIN ([id number]) 1. I would recommend currently that we go ahead and continue with the [facility] although we will actually have the patient come back here for dressing changes on Friday and then will see him again next Tuesday. 2. I am also can recommend we continue with the XtraSorb cover. 3. We will also continue with a 3 layer compression wrap as this seems to be doing a good job for him. We will see patient back for reevaluation in 1 week here in the clinic. If anything worsens or changes patient will contact our office for additional recommendations. Electronic Signature(s) Signed: 01/11/2021 3:16:13 PM By: Worthy Keeler PA-C Entered By: Worthy Keeler on 01/11/2021 15:16:13 [name] ([id number]) -------------------------------------------------------------------------------- SuperBill Details Patient Name: [name] Date of Service: 01/11/2021 Medical Record Number: [id number] Patient Account Number: [id number] Date of Birth/Sex: [birth year] (69 y.o. 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 F79 Unspecified intellectual disabilities Facility Procedures CPT4 Code: 01007121 Description: (Facility Use Only) [phone] - APPLY MULTLAY COMPRS LWR RT LEG Modifier: Quantity: 1 Physician Procedures CPT4 Code: 5498264 Description: 15830 - WC  PHYS LEVEL 3 - EST PT Modifier: Quantity: 1 CPT4 Code: Description: ICD-10 Diagnosis Description S81.801A Unspecified open wound, right lower leg, initial encounter I87.2 Venous insufficiency (chronic) (peripheral) L97.812 Non-pressure chronic ulcer of other part of right lower leg with fat la Z87.820  Personal history of traumatic brain injury Modifier: yer exposed Quantity: Electronic Signature(s) Signed: 01/11/2021 3:16:31 PM By: Worthy Keeler PA-C Entered By: Worthy Keeler on 01/11/2021 15:16:31

## 2021-01-14 ENCOUNTER — Other Ambulatory Visit: Payer: Self-pay

## 2021-01-14 DIAGNOSIS — S81801A Unspecified open wound, right lower leg, initial encounter: Secondary | ICD-10-CM | POA: Diagnosis not present

## 2021-01-14 NOTE — Progress Notes (Signed)
[name], [name] ([id number]) Visit Report for 01/11/2021 Arrival Information Details Patient Name: [name], [name] Date of Service: 01/11/2021 1:45 PM Medical Record Number: [id number] Patient Account Number: [id number] Date of Birth/Sex: [birth year] (69 y.o. M) Treating RN: Dolan Amen Primary Care Daleisa Halperin: Barney Drain Other Clinician: Referring Larnce Schnackenberg: Barney Drain Treating Jelitza Manninen/Extender: Skipper Cliche in Treatment: 3 Visit Information History Since Last Visit Added or deleted any medications: No Patient Arrived: [name] Had a fall or experienced change in No Arrival Time: 13:59 activities of daily living that may affect Accompanied By: social woker risk of falls: Transfer Assistance: None Pain Present Now: Yes Patient Requires Transmission-Based No Precautions: Patient Has Alerts: Yes Patient Alerts: NOT Diabetic ABI: R)0.91 L) 1.18 TBI R) 0.76 L) 0.73 12/02/2020 [facility] Electronic Signature(s) Signed: 01/14/2021 4:23:58 PM By: Jeanine Luz Entered By: Jeanine Luz on 01/11/2021 14:02:52 [name] ([id number]) -------------------------------------------------------------------------------- Clinic Level of Care Assessment Details Patient Name: [name] Date of Service: 01/11/2021 1:45 PM Medical Record Number: [id number] Patient Account Number: [id number] Date of Birth/Sex: 1950-12-24 (69 y.o. M) Treating RN: Dolan Amen Primary Care Quinzell Malcomb: Barney Drain Other Clinician: Referring Vonnie Ligman: Barney Drain Treating Melitza Metheny/Extender: Skipper Cliche in Treatment: 3 Clinic Level of Care Assessment Items TOOL 1 Quantity Score []  - Use when EandM and Procedure is performed on INITIAL visit 0 ASSESSMENTS - Nursing Assessment / Reassessment []  - General Physical Exam (combine w/ comprehensive assessment (listed just below) when performed on new 0 pt. evals) []  - 0 Comprehensive Assessment (HX, ROS, Risk Assessments,  Wounds Hx, etc.) ASSESSMENTS - Wound and Skin Assessment / Reassessment []  - Dermatologic / Skin Assessment (not related to wound area) 0 ASSESSMENTS - Ostomy and/or Continence Assessment and Care []  - Incontinence Assessment and Management 0 []  - 0 Ostomy Care Assessment and Management (repouching, etc.) PROCESS - Coordination of Care []  - Simple Patient / Family Education for ongoing care 0 []  - 0 Complex (extensive) Patient / Family Education for ongoing care []  - 0 Staff obtains Programmer, systems, Records, Test Results / Process Orders []  - 0 Staff telephones HHA, Nursing Homes / Clarify orders / etc []  - 0 Routine Transfer to another Facility (non-emergent condition) []  - 0 Routine Hospital Admission (non-emergent condition) []  - 0 New Admissions / Biomedical engineer / Ordering NPWT, Apligraf, etc. []  - 0 Emergency Hospital Admission (emergent condition) PROCESS - Special Needs []  - Pediatric / Minor Patient Management 0 []  - 0 Isolation Patient Management []  - 0 Hearing / Language / Visual special needs []  - 0 Assessment of Community assistance (transportation, D/C planning, etc.) []  - 0 Additional assistance / Altered mentation []  - 0 Support Surface(s) Assessment (bed, cushion, seat, etc.) INTERVENTIONS - Miscellaneous []  - External ear exam 0 []  - 0 Patient Transfer (multiple staff / Civil Service fast streamer / Similar devices) []  - 0 Simple Staple / Suture removal (25 or less) []  - 0 Complex Staple / Suture removal (26 or more) []  - 0 Hypo/Hyperglycemic Management (do not check if billed separately) []  - 0 Ankle / Brachial Index (ABI) - do not check if billed separately Has the patient been seen at the hospital within the last three years: Yes Total Score: 0 Level Of Care: ____ [name] ([id number]) Electronic Signature(s) Signed: 01/11/2021 5:02:46 PM By: Georges Mouse, Minus Breeding RN Entered By: Georges Mouse, Minus Breeding on 01/11/2021 14:36:01 [name]  ([id number]) -------------------------------------------------------------------------------- Compression Therapy Details Patient Name: [name] Date of Service: 01/11/2021 1:45 PM Medical Record Number:  [id number] Patient Account Number: [id number] Date of Birth/Sex: 05/27/1951 (69 y.o. M) Treating RN: Dolan Amen Primary Care Jaley Yan: Barney Drain Other Clinician: Referring Devery Murgia: Barney Drain Treating Jerran Tappan/Extender: Skipper Cliche in Treatment: 3 Compression Therapy Performed for Wound Assessment: Wound #1 Right,Circumferential Lower Leg Performed By: Clinician Dolan Amen, RN Compression Type: Three Layer Post Procedure Diagnosis Same as Pre-procedure Notes pt tolerating wrap well Electronic Signature(s) Signed: 01/11/2021 5:02:46 PM By: Georges Mouse, Minus Breeding RN Entered By: Georges Mouse, Kenia on 01/11/2021 14:33:50 [name] ([id number]) -------------------------------------------------------------------------------- Encounter Discharge Information Details Patient Name: [name] Date of Service: 01/11/2021 1:45 PM Medical Record Number: [id number] Patient Account Number: [id number] Date of Birth/Sex: 02-25-1951 (69 y.o. M) Treating RN: Dolan Amen Primary Care Jennae Hakeem: Barney Drain Other Clinician: Referring Natina Wiginton: Barney Drain Treating Makailee Nudelman/Extender: Skipper Cliche in Treatment: 3 Encounter Discharge Information Items Discharge Condition: Stable Ambulatory Status: Cane Discharge Destination: Home Transportation: Private Auto Accompanied By: social worker Schedule Follow-up Appointment: Yes Clinical Summary of Care: Electronic Signature(s) Signed: 01/13/2021 11:27:08 AM By: Carlene Coria RN Entered By: Carlene Coria on 01/11/2021 14:59:31 [name] ([id number]) -------------------------------------------------------------------------------- Lower Extremity Assessment Details Patient Name:  [name] Date of Service: 01/11/2021 1:45 PM Medical Record Number: [id number] Patient Account Number: [id number] Date of Birth/Sex: Nov 12, 1951 (69 y.o. M) Treating RN: Dolan Amen Primary Care Elric Tirado: Barney Drain Other Clinician: Referring Stylianos Stradling: Barney Drain Treating Quanasia Defino/Extender: Skipper Cliche in Treatment: 3 Edema Assessment Assessed: [Left: No] [Right: Yes] Edema: [Left: Ye] [Right: s] Calf Left: Right: Point of Measurement: 27 cm From Medial Instep 34.3 cm Ankle Left: Right: Point of Measurement: 12 cm From Medial Instep 27 cm Vascular Assessment Pulses: Dorsalis Pedis Palpable: [Right:Yes] Electronic Signature(s) Signed: 01/11/2021 5:02:46 PM By: Georges Mouse, Minus Breeding RN Signed: 01/14/2021 4:23:58 PM By: Jeanine Luz Entered By: Jeanine Luz on 01/11/2021 14:21:43 [name] ([id number]) -------------------------------------------------------------------------------- Multi Wound Chart Details Patient Name: [name] Date of Service: 01/11/2021 1:45 PM Medical Record Number: [id number] Patient Account Number: [id number] Date of Birth/Sex: [birth year] (69 y.o. M) Treating RN: Dolan Amen Primary Care Quavon Keisling: Barney Drain Other Clinician: Referring Tria Noguera: Barney Drain Treating Tymel Conely/Extender: Skipper Cliche in Treatment: 3 Vital Signs Height(in): 67 Pulse(bpm): 59 Weight(lbs): 170 Blood Pressure(mmHg): 108/68 Body Mass Index(BMI): 27 Temperature(F): 97.9 Respiratory Rate(breaths/min): 18 Photos: [N/A:N/A] Wound Location: Right, Medial Lower Leg N/A N/A Wounding Event: Trauma N/A N/A Primary Etiology: Trauma, Other N/A N/A Secondary Etiology: Venous Leg Ulcer N/A N/A Comorbid History: Hypertension, History of pressure N/A N/A wounds, Neuropathy Date Acquired: 10/20/2020 N/A N/A Weeks of Treatment: 3 N/A N/A Wound Status: Open N/A N/A Measurements L x W x D (cm) 13x27.2x0.2 N/A N/A Area  (cm) : 277.717 N/A N/A Volume (cm) : 55.543 N/A N/A % Reduction in Area: -391.10% N/A N/A % Reduction in Volume: -391.10% N/A N/A Classification: Full Thickness Without Exposed N/A N/A Support Structures Exudate Amount: Large N/A N/A Exudate Type: Serous N/A N/A Exudate Color: amber N/A N/A Wound Margin: Indistinct, nonvisible N/A N/A Granulation Amount: Medium (34-66%) N/A N/A Granulation Quality: Pink, Hyper-granulation N/A N/A Necrotic Amount: Medium (34-66%) N/A N/A Exposed Structures: Fat Layer (Subcutaneous Tissue): N/A N/A Yes Fascia: No Tendon: No Muscle: No Joint: No Bone: No KOLBI, ALTADONNA ([id number]) Treatment Notes Electronic Signature(s) Signed: 01/11/2021 5:02:46 PM By: Georges Mouse, Minus Breeding RN Entered By: Georges Mouse, Minus Breeding on 01/11/2021 14:31:23 [name] ([id number]) -------------------------------------------------------------------------------- [place] Details Patient Name: [name] Date of Service: 01/11/2021 1:45 PM Medical  Record Number: [id number] Patient Account Number: [id number] Date of Birth/Sex: [birth year] (69 y.o. M) Treating RN: Dolan Amen Primary Care Marytza Grandpre: Barney Drain Other Clinician: Referring Keymari Sato: Barney Drain Treating Aaleyah Witherow/Extender: Skipper Cliche in Treatment: 3 Active Inactive Wound/Skin Impairment Nursing Diagnoses: Impaired tissue integrity Knowledge deficit related to ulceration/compromised skin integrity Goals: Patient/caregiver will verbalize understanding of skin care regimen Date Initiated: 12/21/2020 Target Resolution Date: 01/18/2021 Goal Status: Active Ulcer/skin breakdown will have a volume reduction of 30% by week 4 Date Initiated: 12/21/2020 Target Resolution Date: 01/18/2021 Goal Status: Active Ulcer/skin breakdown will have a volume reduction of 50% by week 8 Date Initiated: 12/21/2020 Target Resolution Date: 02/18/2021 Goal Status:  Active Interventions: Assess patient/caregiver ability to obtain necessary supplies Assess patient/caregiver ability to perform ulcer/skin care regimen upon admission and as needed Assess ulceration(s) every visit Provide education on ulcer and skin care Treatment Activities: Referred to DME Gibran Veselka for dressing supplies : 12/21/2020 Skin care regimen initiated : 12/21/2020 Topical wound management initiated : 12/21/2020 Notes: Electronic Signature(s) Signed: 01/11/2021 5:02:46 PM By: Georges Mouse, Minus Breeding RN Entered By: Georges Mouse, Minus Breeding on 01/11/2021 14:31:16 [name] ([id number]) -------------------------------------------------------------------------------- Pain Assessment Details Patient Name: [name] Date of Service: 01/11/2021 1:45 PM Medical Record Number: [id number] Patient Account Number: [id number] Date of Birth/Sex: September 02, 1951 (69 y.o. M) Treating RN: Dolan Amen Primary Care Wetona Viramontes: Barney Drain Other Clinician: Referring Alayzia Pavlock: Barney Drain Treating Marty Uy/Extender: Skipper Cliche in Treatment: 3 Active Problems Location of Pain Severity and Description of Pain Patient Has Paino Yes Site Locations Rate the pain. Current Pain Level: 8 Character of Pain Describe the Pain: Aching Pain Management and Medication Current Pain Management: Electronic Signature(s) Signed: 01/11/2021 5:02:46 PM By: Georges Mouse, Minus Breeding RN Signed: 01/14/2021 4:23:58 PM By: Jeanine Luz Entered By: Jeanine Luz on 01/11/2021 14:05:18 [name] ([id number]) -------------------------------------------------------------------------------- Patient/Caregiver Education Details Patient Name: [name] Date of Service: 01/11/2021 1:45 PM Medical Record Number: [id number] Patient Account Number: [id number] Date of Birth/Gender: 08/01/1951 (70 y.o. M) Treating RN: Dolan Amen Primary Care Physician: Barney Drain Other  Clinician: Referring Physician: Barney Drain Treating Physician/Extender: Skipper Cliche in Treatment: 3 Education Assessment Education Provided To: Patient and Caregiver social worker Education Topics Provided Wound/Skin Impairment: Methods: Explain/Verbal Responses: State content correctly [employer]) Signed: 01/11/2021 5:02:46 PM By: Georges Mouse, Minus Breeding RN Entered By: Georges Mouse, Minus Breeding on 01/11/2021 14:36:30 [name] ([id number]) -------------------------------------------------------------------------------- Wound Assessment Details Patient Name: [name] Date of Service: 01/11/2021 1:45 PM Medical Record Number: [id number] Patient Account Number: [id number] Date of Birth/Sex: 1951/02/17 (69 y.o. M) Treating RN: Dolan Amen Primary Care Billyjack Trompeter: Barney Drain Other Clinician: Referring Kaiulani Sitton: Barney Drain Treating Carolee Channell/Extender: Skipper Cliche in Treatment: 3 Wound Status Wound Number: 1 Primary Etiology: Trauma, Other Wound Location: Right, Medial Lower Leg Secondary Venous Leg Ulcer Etiology: Wounding Event: Trauma Wound Status: Open Date Acquired: 10/20/2020 Comorbid History: Hypertension, History of pressure wounds, Weeks Of Treatment: 3 Neuropathy Clustered Wound: No Photos Photo Uploaded By: Jeanine Luz on 01/11/2021 14:24:07 Wound Measurements Length: (cm) 13 Width: (cm) 27.2 Depth: (cm) 0.2 Area: (cm) 277.717 Volume: (cm) 55.543 % Reduction in Area: -391.1% % Reduction in Volume: -391.1% Tunneling: No Undermining: No Wound Description Classification: Full Thickness Without Exposed Support Structu Wound Margin: Indistinct, nonvisible Exudate Amount: Large Exudate Type: Serous Exudate Color: amber res Foul Odor After Cleansing: No Slough/Fibrino Yes Wound Bed Granulation Amount: Medium (34-66%) Exposed Structure Granulation Quality: Pink, Hyper-granulation Fascia Exposed:  No Necrotic Amount: Medium (34-66%)  Fat Layer (Subcutaneous Tissue) Exposed: Yes Necrotic Quality: Adherent Slough Tendon Exposed: No Muscle Exposed: No Joint Exposed: No Bone Exposed: No Electronic Signature(s) Signed: 01/11/2021 5:02:46 PM By: Georges Mouse, Minus Breeding RN Signed: 01/14/2021 4:23:58 PM By: Jeanine Luz Entered By: Jeanine Luz on 01/11/2021 14:20:06 [name] ([id number]) -------------------------------------------------------------------------------- Vitals Details Patient Name: [name] Date of Service: 01/11/2021 1:45 PM Medical Record Number: [id number] Patient Account Number: [id number] Date of Birth/Sex: [birth year] (69 y.o. M) Treating RN: Dolan Amen Primary Care Ladamien Rammel: Barney Drain Other Clinician: Referring Thedore Pickel: Barney Drain Treating Tationna Fullard/Extender: Skipper Cliche in Treatment: 3 Vital Signs Time Taken: 14:04 Temperature (F): 97.9 Height (in): 67 Pulse (bpm): 59 Weight (lbs): 170 Respiratory Rate (breaths/min): 18 Body Mass Index (BMI): 26.6 Blood Pressure (mmHg): 108/68 Reference Range: 80 - 120 mg / dl Electronic Signature(s) Signed: 01/14/2021 4:23:58 PM By: Jeanine Luz Entered By: Jeanine Luz on 01/11/2021 14:04:57

## 2021-01-18 ENCOUNTER — Other Ambulatory Visit: Payer: Self-pay

## 2021-01-18 ENCOUNTER — Encounter: Payer: Medicare (Managed Care) | Attending: Physician Assistant | Admitting: Physician Assistant

## 2021-01-18 DIAGNOSIS — F79 Unspecified intellectual disabilities: Secondary | ICD-10-CM | POA: Insufficient documentation

## 2021-01-18 DIAGNOSIS — X58XXXA Exposure to other specified factors, initial encounter: Secondary | ICD-10-CM | POA: Insufficient documentation

## 2021-01-18 DIAGNOSIS — S81801A Unspecified open wound, right lower leg, initial encounter: Secondary | ICD-10-CM | POA: Insufficient documentation

## 2021-01-18 DIAGNOSIS — I872 Venous insufficiency (chronic) (peripheral): Secondary | ICD-10-CM | POA: Diagnosis not present

## 2021-01-18 DIAGNOSIS — Z87820 Personal history of traumatic brain injury: Secondary | ICD-10-CM | POA: Insufficient documentation

## 2021-01-18 DIAGNOSIS — L97812 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r exposed: Secondary | ICD-10-CM | POA: Insufficient documentation

## 2021-01-18 NOTE — Progress Notes (Addendum)
BENSYN, BORNEMANN ([id number]) Visit Report for 01/18/2021 Chief Complaint Document Details Patient Name: [name], [name] Date of Service: 01/18/2021 2:30 PM Medical Record Number: [id number] Patient Account Number: [id number] Date of Birth/Sex: 1951/05/22 (70 y.o. M) Treating RN: Dolan Amen Primary Care Provider: Barney Drain Other Clinician: Referring Provider: Barney Drain Treating Provider/Extender: Skipper Cliche in Treatment: 4 Information Obtained from: Patient Chief Complaint Right LE Ulcer Electronic Signature(s) Signed: 01/18/2021 2:26:54 PM By: Worthy Keeler PA-C Entered By: Worthy Keeler on 01/18/2021 14:26:53 [name] ([id number]) -------------------------------------------------------------------------------- HPI Details Patient Name: [name] Date of Service: 01/18/2021 2:30 PM Medical Record Number: [id number] Patient Account Number: [id number] Date of Birth/Sex: 12-13-1950 (70 y.o. M) Treating RN: Dolan Amen Primary Care Provider: Barney Drain Other Clinician: Referring Provider: Barney Drain Treating Provider/Extender: Skipper Cliche in Treatment: 4 History of Present Illness HPI Description: 12/21/2020 upon evaluation today patient appears to be doing somewhat poorly in regard to his right lower extremity. He does have some erythema and is also having issues with quite a bit of drainage currently from a wound that he tells me has been present since about 2 months ago when he initially had an injury that led to this wound beginning. He does have chronic venous stasis and is secondary to this I think it has been more difficult to heal this wound than normal. With that being said the patient tells me that this tends to drain a lot down onto his feet. He is also had some issues with fungal infection he is currently on miconazole powder for that. Fortunately there is no signs of active infection systemically at this time. No  fevers, chills, nausea, vomiting, or diarrhea. He does have pain but again this is more to the posterior aspect not really more lateral or anterior. There is evidence that he may have some local infection due to erythema and warmth surrounding the wound post debridement I do want to obtain a wound culture to evaluate and see if there is anything indeed of concern here. He did have arterial studies which revealed that he had a ABI of 0.91 on the right with a TBI of 0.76 in the left his ABI was 1.18 with a TBI of 0.73. He has good blood flow here. In regard to his x-ray that was taken on 11/30/2020 there was no evidence of osseous abnormality. The arterial study was actually performed on 12/03/2020. The patient was hospitalized from 11/30/2020 through 12/02/2020 during that time he was on IV antibiotics including vancomycin and cefepime. 12/28/2020 upon evaluation today patient appears to be doing somewhat poorly in regard to his legs currently is having a lot of drainage unfortunately which again he was not actually being wrapped with a 3 layer compression wrap after this was changed at pace. Subsequently I think that the patient does need to have ongoing compression therapy. Right now that means that were doing the compression wrap later he will need compression stockings. Either way though I think that he cannot just have a border foam dressing on this which was what was on today that is not sufficient to catch any of the drainage and to be honest I think this can lead to infection and greater issues here for him. The wound is pretty much about circumferential at this point a weeping area included. 01/04/2021 upon evaluation today patient unfortunately is doing quite a bit worse in regard to the overall appearance of his wound from the standpoint of infection. I  did see evidence of bright green drainage consistent with Pseudomonas which I discussed with the patient today as well as his representative from  social services. Nonetheless I believe that we likely need to treat him for Pseudomonas right now the clindamycin he is on is not covering for this. With that being said no sharp debridement was performed today. Of note the patient's Hydrofera Blue dressing was not the ready transfer version and unfortunately did cause this did not drain quite as well as we would like to. Nonetheless I do feel like that the patient would benefit from continuing with the wraps changed out their pace I think they can definitely do a good job and his swelling is under good control the big thing here is that we need to make sure that the dressing is applied appropriately we will contact them just to confirm and make sure that they know the appropriate way to apply the [facility] that they have. 01/11/2021 upon evaluation today patient appears to be doing somewhat better in regard to the erythema on his leg at this point. Fortunately there are no signs of active infection at this time. No fevers, chills, nausea, vomiting, or diarrhea. With that being said he has been tolerating the dressing changes without complication and very pleased in that regard. I do think that he seems to be making good progress in general which is good news. I think that the biggest issue I see right now is simply the [facility] that is being used cannot transfer through to the [place] and therefore extracting a lot more fluid against the skin which is causing some irritation as well. I think that we may be able to wrap him here and get things in a better position for him to be honest. 01/18/2021 upon evaluation today patient appears to be doing better in regard to his leg. I feel like he is definitely making improvement and overall I think that this is significantly improved compared to the previous evaluation. With that being said there is still quite a ways to go to get this to heal and it is going to take quite a bit of time. Fortunately  there is no evidence of active infection at this time. No fevers, chills, nausea, vomiting, or diarrhea. Electronic Signature(s) Signed: 01/18/2021 3:21:07 PM By: Worthy Keeler PA-C Entered By: Worthy Keeler on 01/18/2021 15:21:07 [name] ([id number]) -------------------------------------------------------------------------------- Physical Exam Details Patient Name: [name] Date of Service: 01/18/2021 2:30 PM Medical Record Number: [id number] Patient Account Number: [id number] Date of Birth/Sex: 1951-03-23 (70 y.o. M) Treating RN: Dolan Amen Primary Care Provider: Barney Drain Other Clinician: Referring Provider: Barney Drain Treating Provider/Extender: Skipper Cliche in Treatment: 4 Constitutional Well-nourished and well-hydrated in no acute distress. Respiratory normal breathing without difficulty. Psychiatric this patient is able to make decisions and demonstrates good insight into disease process. Alert and Oriented x 3. pleasant and cooperative. Notes Patient's wound bed showed signs of good granulation at this time. There does not appear to be any evidence of worsening infection which is great news and overall very pleased with where things stand. I do think that the infection is actually resolving and again I do believe extending the antibiotic for 1 more week would be beneficial I will lower the dose over from 750 mg to just 500 mg on the Levaquin. Otherwise I think he is really doing quite well. Electronic Signature(s) Signed: 01/18/2021 3:21:42 PM By: Worthy Keeler PA-C Entered By: Joaquim Lai  IIIMargarita Grizzle on 01/18/2021 15:21:42 [name], [name] ([id number]) -------------------------------------------------------------------------------- Physician Orders Details Patient Name: [name] Date of Service: 01/18/2021 2:30 PM Medical Record Number: [id number] Patient Account Number: [id number] Date of Birth/Sex: 21-Aug-1951 (70 y.o. 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 o Nurse Visit as needed Bathing/ Shower/ Hygiene Wound #1 Right,Circumferential Lower Leg o May shower with wound dressing protected with water repellent cover or cast protector. Edema Control - Lymphedema / Segmental Compressive Device / Other Right Lower Extremity o Optional: One layer of unna paste to top of compression wrap (to act as an anchor). o 3 Layer Compression System for Lymphedema. Wound Treatment Wound #1 - Lower Leg Wound Laterality: Right, Circumferential Cleanser: Normal Saline 2 x Per Week/30 Days Discharge Instructions: Wash your hands with soap and water. Remove old dressing, discard into plastic bag and place into trash. Cleanse the wound with Normal Saline prior to applying a clean dressing using gauze sponges, not tissues or cotton balls. Do not scrub or use excessive force. Pat dry using gauze sponges, not tissue or cotton balls. Cleanser: Soap and Water 2 x Per Week/30 Days Discharge Instructions: Gently cleanse wound with antibacterial soap, rinse and pat dry prior to dressing wounds Peri-Wound Care: Desitin Maximum Strength Ointment 4 (oz) 2 x Per Week/30 Days Primary Dressing: Hydrofera Blue Ready Transfer Foam, 4x5 (in/in) 2 x Per Week/30 Days Discharge Instructions: Apply Hydrofera Blue Ready to wound bed as  directed Secondary Dressing: Xtrasorb Large 6x9 (in/in) 2 x Per Week/30 Days Discharge Instructions: Apply to wound as directed. Do not cut. Compression Wrap: Profore Lite LF 3 Multilayer Compression Bandaging System 2 x Per Week/30 Days Discharge Instructions: Apply 3 multi-layer wrap as prescribed. Electronic Signature(s) Signed: 01/18/2021 4:50:13 PM By: Worthy Keeler PA-C Signed: 01/18/2021 5:03:53 PM By: Georges Mouse, Minus Breeding RN Entered By: Georges Mouse, Minus Breeding on 01/18/2021 15:09:39 [name] ([id number]) -------------------------------------------------------------------------------- Problem List Details Patient Name: [name] Date of Service: 01/18/2021 2:30 PM Medical Record Number: [id number] Patient Account Number: [id number] Date of Birth/Sex: 1951/01/22 (70 y.o. 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 M) Treating RN: Dolan Amen Primary Care Provider: Barney Drain Other Clinician: Referring Provider: Barney Drain Treating Provider/Extender:  Jeri Cos Weeks in Treatment: 4 Subjective Chief Complaint Information obtained from Patient Right LE Ulcer History of Present Illness (HPI) 12/21/2020 upon evaluation today patient appears to be doing somewhat poorly in regard to his right lower extremity. He does have some erythema and is also having issues with quite a bit of drainage currently from a wound that he tells me has been present since about 2 months ago when he initially had an injury that led to this wound beginning. He does have chronic venous stasis and is secondary to this I think it has been more difficult to heal this wound than normal. With that being said the patient tells me that this tends to drain a lot down onto his feet. He is also had some issues with fungal infection he is currently on miconazole powder for that. Fortunately there is no signs of active infection systemically at this time. No fevers, chills, nausea, vomiting, or diarrhea. He does have pain but again this is more to the posterior aspect not really more lateral or anterior. There is evidence that he may have some local infection due to erythema and warmth surrounding the wound post debridement I do want to obtain a wound culture to evaluate and see if there is anything indeed of concern here. He did have arterial studies which revealed that he had a ABI of 0.91 on the right with a TBI of 0.76 in the left his ABI was 1.18 with a TBI of 0.73. He has good blood flow here. In regard to his x-ray that was taken on 11/30/2020 there was no evidence of osseous abnormality. The arterial study was actually performed on 12/03/2020. The patient was hospitalized from 11/30/2020 through 12/02/2020 during that time he was on IV antibiotics including vancomycin and cefepime. 12/28/2020 upon evaluation today patient  appears to be doing somewhat poorly in regard to his legs currently is having a lot of drainage unfortunately which again he was not actually being wrapped with a 3 layer compression wrap after this was changed at pace. Subsequently I think that the patient does need to have ongoing compression therapy. Right now that means that were doing the compression wrap later he will need compression stockings. Either way though I think that he cannot just have a border foam dressing on this which was what was on today that is not sufficient to catch any of the drainage and to be honest I think this can lead to infection and greater issues here for him. The wound is pretty much about circumferential at this point a weeping area included. 01/04/2021 upon evaluation today patient unfortunately is doing quite a bit worse in regard to the overall appearance of his wound from the standpoint of infection. I did see evidence of bright green drainage consistent with Pseudomonas which I discussed with the patient today as well as his representative from social services. Nonetheless I believe that we likely need to treat him for Pseudomonas right now the clindamycin he is on is not covering for this. With that being said no sharp debridement was performed today. Of note the patient's Hydrofera Blue dressing was not the ready transfer version and unfortunately did cause this did not drain quite as well as we would like to. Nonetheless I do feel like that the patient would benefit from continuing with the wraps changed out their pace I think they can definitely do a good job and his swelling is under good control the big thing here is that we  need to make sure that the dressing is applied appropriately we will contact them just to confirm and make sure that they know the appropriate way to apply the [facility] that they have. 01/11/2021 upon evaluation today patient appears to be doing somewhat better in regard to the  erythema on his leg at this point. Fortunately there are no signs of active infection at this time. No fevers, chills, nausea, vomiting, or diarrhea. With that being said he has been tolerating the dressing changes without complication and very pleased in that regard. I do think that he seems to be making good progress in general which is good news. I think that the biggest issue I see right now is simply the [facility] that is being used cannot transfer through to the [place] and therefore extracting a lot more fluid against the skin which is causing some irritation as well. I think that we may be able to wrap him here and get things in a better position for him to be honest. 01/18/2021 upon evaluation today patient appears to be doing better in regard to his leg. I feel like he is definitely making improvement and overall I think that this is significantly improved compared to the previous evaluation. With that being said there is still quite a ways to go to get this to heal and it is going to take quite a bit of time. Fortunately there is no evidence of active infection at this time. No fevers, chills, nausea, vomiting, or diarrhea. Objective Constitutional Well-nourished and well-hydrated in no acute distress. Vitals Time Taken: 2:35 PM, Height: 67 in, Weight: 170 lbs, BMI: 26.6, Temperature: 98.2 F, Pulse: 63 bpm, Respiratory Rate: 18 breaths/min, Blood Pressure: 150/67 mmHg. [name], [name] ([id number]) Respiratory normal breathing without difficulty. Psychiatric this patient is able to make decisions and demonstrates good insight into disease process. Alert and Oriented x 3. pleasant and cooperative. General Notes: Patient's wound bed showed signs of good granulation at this time. There does not appear to be any evidence of worsening infection which is great news and overall very pleased with where things stand. I do think that the infection is actually resolving and again I  do believe extending the antibiotic for 1 more week would be beneficial I will lower the dose over from 750 mg to just 500 mg on the Levaquin. Otherwise I think he is really doing quite well. Integumentary (Hair, Skin) Wound #1 status is Open. Original cause of wound was Trauma. The date acquired was: 10/20/2020. The wound has been in treatment 4 weeks. The wound is located on the Right,Circumferential Lower Leg. The wound measures 9cm length x 22cm width x 0.2cm depth; 155.509cm^2 area and 31.102cm^3 volume. There is Fat Layer (Subcutaneous Tissue) exposed. There is no tunneling or undermining noted. There is a large amount of serous drainage noted. The wound margin is indistinct and nonvisible. There is medium (34-66%) pink, hyper - granulation within the wound bed. There is a medium (34-66%) amount of necrotic tissue within the wound bed including Adherent Slough. 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 There was a Three Layer Compression Therapy Procedure by Dolan Amen, RN. Post procedure Diagnosis Wound #1: Same as  Pre-Procedure Notes: pt tolerating wrap well. Plan Follow-up Appointments: Wound #1 Right,Circumferential Lower Leg: Return Appointment in 1 week. Nurse Visit as needed Bathing/ Shower/ Hygiene: Wound #1 Right,Circumferential Lower Leg: May shower with wound dressing protected with water repellent cover or cast protector. Edema Control - Lymphedema / Segmental Compressive Device / Other: Optional: One layer of unna paste to top of compression wrap (to act as an anchor). 3 Layer  Compression System for Lymphedema. WOUND #1: - Lower Leg Wound Laterality: Right, Circumferential Cleanser: Normal Saline 2 x Per Week/30 Days Discharge Instructions: Wash your hands with soap and water. Remove old dressing, discard into plastic bag and place into trash. Cleanse the wound with Normal Saline prior to applying a clean dressing using gauze sponges, not tissues or cotton balls. Do not scrub or use excessive force. Pat dry using gauze sponges, not tissue or cotton balls. Cleanser: Soap and Water 2 x Per Week/30 Days Discharge Instructions: Gently cleanse wound with antibacterial soap, rinse and pat dry prior to dressing wounds Peri-Wound Care: Desitin Maximum Strength Ointment 4 (oz) 2 x Per Week/30 Days Primary Dressing: Hydrofera Blue Ready Transfer Foam, 4x5 (in/in) 2 x Per Week/30 Days Discharge Instructions: Apply Hydrofera Blue Ready to wound bed as directed Secondary Dressing: Xtrasorb Large 6x9 (in/in) 2 x Per Week/30 Days Discharge Instructions: Apply to wound as directed. Do not cut. [name], [name] ([id number]) Compression Wrap: Profore Lite LF 3 Multilayer Compression Bandaging System 2 x Per Week/30 Days Discharge Instructions: Apply 3 multi-layer wrap as prescribed. 1. Would recommend at this point that we have the patient go ahead and continue with the wound care measures as before using Desitin around the edges of the open areas to protect the good skin. Subsequently we can use Hydrofera Blue over the wounds himself. 2. We will also use XtraSorb over top of the [facility]. 3. I would also recommend that we continue with the compression wrap. I do feel like the 3 layer compression wrap has been beneficial. We will see patient back for reevaluation in 1 week here in the clinic. If anything worsens or changes patient will contact our office for additional recommendations. We will see him for a nurse visit on Friday as well Electronic Signature(s) Signed:  01/18/2021 3:22:36 PM By: Worthy Keeler PA-C Entered By: Worthy Keeler on 01/18/2021 15:22:36 [name] ([id number]) -------------------------------------------------------------------------------- SuperBill Details Patient Name: [name] Date of Service: 01/18/2021 Medical Record Number: [id number] Patient Account Number: [id number] Date of Birth/Sex: [birth year] (70 y.o. 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 Unspecified intellectual disabilities Facility Procedures CPT4 Code: [id number] Description: (Facility Use Only) [phone] - APPLY MULTLAY COMPRS LWR RT LEG Modifier: Quantity: 1 Physician Procedures CPT4 Code: 9357017 Description: 99213 - WC PHYS LEVEL 3 - EST PT Modifier: Quantity: 1 CPT4 Code: Description: ICD-10 Diagnosis Description S81.801A Unspecified open wound, right lower leg, initial encounter I87.2 Venous insufficiency (chronic) (peripheral) L97.812 Non-pressure chronic ulcer of other part of right lower leg with fat la Z87.820  Personal history of traumatic brain injury Modifier: yer exposed Quantity: Electronic Signature(s) Signed: 01/18/2021 3:22:54 PM By: Worthy Keeler PA-C Entered By:  Worthy Keeler on 01/18/2021 15:22:54

## 2021-01-18 NOTE — Progress Notes (Addendum)
SIDDHARTHA, HOBACK ([id number]) Visit Report for 01/18/2021 Arrival Information Details Patient Name: [name], [name] Date of Service: 01/18/2021 2:30 PM Medical Record Number: [id number] Patient Account Number: [id number] Date of Birth/Sex: 1951/01/18 (69 y.o. M) Treating RN: Dolan Amen Primary Care : Barney Drain Other Clinician: Referring : Barney Drain Treating /Extender: Skipper Cliche in Treatment: 4 Visit Information History Since Last Visit Added or deleted any medications: No Patient Arrived: [name] Had a fall or experienced change in No Arrival Time: 14:33 activities of daily living that may affect Accompanied By: social worker risk of falls: Transfer Assistance: None Hospitalized since last visit: No Patient Identification Verified: Yes Pain Present Now: Yes Secondary Verification Process Completed: Yes Patient Requires Transmission-Based No Precautions: Patient Has Alerts: Yes Patient Alerts: NOT Diabetic ABI: R)0.91 L) 1.18 TBI R) 0.76 L) 0.73 12/02/2020 [facility] Electronic Signature(s) Signed: 01/18/2021 4:14:13 PM By: Jeanine Luz Entered By: Jeanine Luz on 01/18/2021 14:34:49 [name] ([id number]) -------------------------------------------------------------------------------- Clinic Level of Care Assessment Details Patient Name: [name] Date of Service: 01/18/2021 2:30 PM Medical Record Number: [id number] Patient Account Number: [id number] Date of Birth/Sex: 1951-02-21 (69 y.o. M) Treating RN: Dolan Amen Primary Care : Barney Drain Other Clinician: Referring : Barney Drain Treating /Extender: Skipper Cliche in Treatment: 4 Clinic Level of Care Assessment Items TOOL 1 Quantity Score _0  - Use when EandM and Procedure is performed on INITIAL visit 0 ASSESSMENTS - Nursing Assessment / Reassessment _1  - General Physical Exam (combine w/ comprehensive assessment  (listed just below) when performed on new 0 pt. evals) _2  - 0 Comprehensive Assessment (HX, ROS, Risk Assessments, Wounds Hx, etc.) ASSESSMENTS - Wound and Skin Assessment / Reassessment _3  - Dermatologic / Skin Assessment (not related to wound area) 0 ASSESSMENTS - Ostomy and/or Continence Assessment and Care _4  - Incontinence Assessment and Management 0 _5  - 0 Ostomy Care Assessment and Management (repouching, etc.) PROCESS - Coordination of Care _6  - Simple Patient / Family Education for ongoing care 0 _7  - 0 Complex (extensive) Patient / Family Education for ongoing care _8  - 0 Staff obtains Programmer, systems, Records, Test Results / Process Orders _9  - 0 Staff telephones HHA, Nursing Homes / Clarify orders / etc _10  - 0 Routine Transfer to another Facility (non-emergent condition) _11  - 0 Routine Hospital Admission (non-emergent condition) _12  - 0 New Admissions / Biomedical engineer / Ordering NPWT, Apligraf, etc. _13  - 0 Emergency Hospital Admission (emergent condition) PROCESS - Special Needs _14  - Pediatric / Minor Patient Management 0 _15  - 0 Isolation Patient Management _16  - 0 Hearing / Language / Visual special needs _17  - 0 Assessment of Community assistance (transportation, D/C planning, etc.) _18  - 0 Additional assistance / Altered mentation _19  - 0 Support Surface(s) Assessment (bed, cushion, seat, etc.) INTERVENTIONS - Miscellaneous _20  - External ear exam 0 _21  - 0 Patient Transfer (multiple staff / Civil Service fast streamer / Similar devices) _22  - 0 Simple Staple / Suture removal (25 or less) _23  - 0 Complex Staple / Suture removal (26 or more) _24  - 0 Hypo/Hyperglycemic Management (do not check if billed separately) _25  - 0 Ankle / Brachial Index (ABI) - do not check if billed separately Has the patient been seen at the hospital within the last three years: Yes Total Score: 0 Level Of Care: ____ [name] ([id number]) Electronic Signature(s) Signed: 01/18/2021 5:03:53  PM By: Georges Mouse, Minus Breeding RN Entered By: Georges Mouse, Minus Breeding on 01/18/2021 15:09:45 [name] ([id number]) -------------------------------------------------------------------------------- Compression Therapy Details  Patient Name: [name], [name] Date of Service: 01/18/2021 2:30 PM Medical Record Number: [id number] Patient Account Number: [id number] Date of Birth/Sex: Dec 19, 1950 (69 y.o. M) Treating RN: Dolan Amen Primary Care : Barney Drain Other Clinician: Referring : Barney Drain Treating /Extender: Skipper Cliche in Treatment: 4 Compression Therapy Performed for Wound Assessment: Wound #1 Right,Circumferential Lower Leg Performed By: Clinician Dolan Amen, RN Compression Type: Three Layer Post Procedure Diagnosis Same as Pre-procedure Notes pt tolerating wrap well Electronic Signature(s) Signed: 01/18/2021 5:03:53 PM By: Georges Mouse, Minus Breeding RN Entered By: Georges Mouse, Minus Breeding on 01/18/2021 15:08:55 [name] ([id number]) -------------------------------------------------------------------------------- Encounter Discharge Information Details Patient Name: [name] Date of Service: 01/18/2021 2:30 PM Medical Record Number: [id number] Patient Account Number: [id number] Date of Birth/Sex: 02/21/1951 (69 y.o. M) Treating RN: Dolan Amen Primary Care : Barney Drain Other Clinician: Referring : Barney Drain Treating /Extender: Skipper Cliche in Treatment: 4 Encounter Discharge Information Items Discharge Condition: Stable Ambulatory Status: Cane Discharge Destination: Home Transportation: Private Auto Accompanied By: social worker Schedule Follow-up Appointment: Yes Clinical Summary of Care: Electronic Signature(s) Signed: 01/18/2021 5:03:53 PM By: Georges Mouse, Minus Breeding RN Entered By: Georges Mouse, Minus Breeding on 01/18/2021 15:11:05 [name]  ([id number]) -------------------------------------------------------------------------------- Lower Extremity Assessment Details Patient Name: [name] Date of Service: 01/18/2021 2:30 PM Medical Record Number: [id number] Patient Account Number: [id number] Date of Birth/Sex: 09-28-1951 (69 y.o. M) Treating RN: Dolan Amen Primary Care : Barney Drain Other Clinician: Referring : Barney Drain Treating /Extender: Skipper Cliche in Treatment: 4 Edema Assessment Assessed: [Left: No] [Right: No] Edema: [Left: Ye] [Right: s] Calf Left: Right: Point of Measurement: 27 cm From Medial Instep 35 cm Ankle Left: Right: Point of Measurement: 12 cm From Medial Instep 27 cm Vascular Assessment Pulses: Dorsalis Pedis Palpable: [Right:Yes] Electronic Signature(s) Signed: 01/18/2021 4:14:13 PM By: Jeanine Luz Signed: 01/18/2021 5:03:53 PM By: Georges Mouse, Minus Breeding RN Entered By: Jeanine Luz on 01/18/2021 14:55:04 [name] ([id number]) -------------------------------------------------------------------------------- Multi Wound Chart Details Patient Name: [name] Date of Service: 01/18/2021 2:30 PM Medical Record Number: [id number] Patient Account Number: [id number] Date of Birth/Sex: [birth year] (69 y.o. M) Treating RN: Dolan Amen Primary Care : Barney Drain Other Clinician: Referring : Barney Drain Treating /Extender: Skipper Cliche in Treatment: 4 Vital Signs Height(in): 67 Pulse(bpm): 63 Weight(lbs): 170 Blood Pressure(mmHg): 150/67 Body Mass Index(BMI): 27 Temperature(F): 98.2 Respiratory Rate(breaths/min): 18 Photos: [N/A:N/A] Wound Location: Right, Circumferential Lower Leg N/A N/A Wounding Event: Trauma N/A N/A Primary Etiology: Trauma, Other N/A N/A Secondary Etiology: Venous Leg Ulcer N/A N/A Comorbid History: Hypertension, History of pressure N/A N/A wounds,  Neuropathy Date Acquired: 10/20/2020 N/A N/A Weeks of Treatment: 4 N/A N/A Wound Status: Open N/A N/A Measurements L x W x D (cm) 9x22x0.2 N/A N/A Area (cm) : 155.509 N/A N/A Volume (cm) : 31.102 N/A N/A % Reduction in Area: -175.00% N/A N/A % Reduction in Volume: -175.00% N/A N/A Classification: Full Thickness Without Exposed N/A N/A Support Structures Exudate Amount: Large N/A N/A Exudate Type: Serous N/A N/A Exudate Color: amber N/A N/A Wound Margin: Indistinct, nonvisible N/A N/A Granulation Amount: Medium (34-66%) N/A N/A Granulation Quality: Pink, Hyper-granulation N/A N/A Necrotic Amount: Medium (34-66%) N/A N/A Exposed Structures: Fat Layer (Subcutaneous Tissue): N/A N/A Yes Fascia: No Tendon: No Muscle: No Joint: No Bone: No Epithelialization: None N/A N/A Treatment Notes Electronic Signature(s) Signed: 01/18/2021 5:03:53 PM By: Georges Mouse, Minus Breeding RN Entered By: Georges Mouse, Minus Breeding on 01/18/2021 15:04:55 [name] ([id number]) --------------------------------------------------------------------------------  Multi-Disciplinary Care Plan Details Patient Name: [name], [name] Date of Service: 01/18/2021 2:30 PM Medical Record Number: [id number] Patient Account Number: [id number] Date of Birth/Sex: 02-Dec-1950 (70 y.o. M) Treating RN: Dolan Amen Primary Care : Barney Drain Other Clinician: Referring : Barney Drain Treating /Extender: Skipper Cliche in Treatment: 4 Active Inactive Wound/Skin Impairment Nursing Diagnoses: Impaired tissue integrity Knowledge deficit related to ulceration/compromised skin integrity Goals: Patient/caregiver will verbalize understanding of skin care regimen Date Initiated: 12/21/2020 Date Inactivated: 01/18/2021 Target Resolution Date: 01/18/2021 Goal Status: Met Ulcer/skin breakdown will have a volume reduction of 30% by week 4 Date Initiated: 12/21/2020 Target Resolution Date:  01/18/2021 Goal Status: Active Ulcer/skin breakdown will have a volume reduction of 50% by week 8 Date Initiated: 12/21/2020 Target Resolution Date: 02/18/2021 Goal Status: Active Interventions: Assess patient/caregiver ability to obtain necessary supplies Assess patient/caregiver ability to perform ulcer/skin care regimen upon admission and as needed Assess ulceration(s) every visit Provide education on ulcer and skin care Treatment Activities: Referred to DME  for dressing supplies : 12/21/2020 Skin care regimen initiated : 12/21/2020 Topical wound management initiated : 12/21/2020 Notes: Electronic Signature(s) Signed: 01/18/2021 5:03:53 PM By: Georges Mouse, Minus Breeding RN Entered By: Georges Mouse, Minus Breeding on 01/18/2021 15:04:47 [name] ([id number]) -------------------------------------------------------------------------------- Pain Assessment Details Patient Name: [name] Date of Service: 01/18/2021 2:30 PM Medical Record Number: [id number] Patient Account Number: [id number] Date of Birth/Sex: [birth year] (69 y.o. M) Treating RN: Dolan Amen Primary Care : Barney Drain Other Clinician: Referring : Barney Drain Treating /Extender: Skipper Cliche in Treatment: 4 Active Problems Location of Pain Severity and Description of Pain Patient Has Paino Yes Site Locations Rate the pain. Current Pain Level: 8 Pain Management and Medication Current Pain Management: Electronic Signature(s) Signed: 01/18/2021 4:14:13 PM By: Jeanine Luz Signed: 01/18/2021 5:03:53 PM By: Georges Mouse, Minus Breeding RN Entered By: Jeanine Luz on 01/18/2021 14:35:48 [name] ([id number]) -------------------------------------------------------------------------------- Patient/Caregiver Education Details Patient Name: [name] Date of Service: 01/18/2021 2:30 PM Medical Record Number: [id number] Patient Account Number: [id number] Date of  Birth/Gender: [birth year] (70 y.o. M) Treating RN: Dolan Amen Primary Care Physician: Barney Drain Other Clinician: Referring Physician: Barney Drain Treating Physician/Extender: Skipper Cliche in Treatment: 4 Education Assessment Education Provided To: Patient and Caregiver social worker Education Topics Provided Wound/Skin Impairment: Methods: Explain/Verbal Responses: State content correctly [employer]) Signed: 01/18/2021 5:03:53 PM By: Georges Mouse, Minus Breeding RN Entered By: Georges Mouse, Minus Breeding on 01/18/2021 15:10:33 [name] ([id number]) -------------------------------------------------------------------------------- Wound Assessment Details Patient Name: [name] Date of Service: 01/18/2021 2:30 PM Medical Record Number: [id number] Patient Account Number: [id number] Date of Birth/Sex: [birth year] (69 y.o. M) Treating RN: Dolan Amen Primary Care : Barney Drain Other Clinician: Referring : Barney Drain Treating /Extender: Skipper Cliche in Treatment: 4 Wound Status Wound Number: 1 Primary Etiology: Trauma, Other Wound Location: Right, Circumferential Lower Leg Secondary Venous Leg Ulcer Etiology: Wounding Event: Trauma Wound Status: Open Date Acquired: 10/20/2020 Comorbid History: Hypertension, History of pressure wounds, Weeks Of Treatment: 4 Neuropathy Clustered Wound: No Photos Wound Measurements Length: (cm) 9 Width: (cm) 22 Depth: (cm) 0.2 Area: (cm) 155.509 Volume: (cm) 31.102 % Reduction in Area: -175% % Reduction in Volume: -175% Epithelialization: None Tunneling: No Undermining: No Wound Description Classification: Full Thickness Without Exposed Support Structures Wound Margin: Indistinct, nonvisible Exudate Amount: Large Exudate Type: Serous Exudate Color: amber Foul Odor After Cleansing: No Slough/Fibrino Yes Wound Bed Granulation Amount: Medium (34-66%) Exposed  Structure Granulation Quality: Pink, Hyper-granulation Fascia Exposed:  No Necrotic Amount: Medium (34-66%) Fat Layer (Subcutaneous Tissue) Exposed: Yes Necrotic Quality: Adherent Slough Tendon Exposed: No Muscle Exposed: No Joint Exposed: No Bone Exposed: No Treatment Notes Wound #1 (Lower Leg) Wound Laterality: Right, Circumferential Cleanser Normal Saline Discharge Instruction: Wash your hands with soap and water. Remove old dressing, discard into plastic bag and place into trash. Cleanse the wound with Normal Saline prior to applying a clean dressing using gauze sponges, not tissues or cotton balls. Do not scrub or use excessive force. Pat dry using gauze sponges, not tissue or cotton balls. [name], [name] ([id number]) Soap and Water Discharge Instruction: Gently cleanse wound with antibacterial soap, rinse and pat dry prior to dressing wounds Peri-Wound Care Desitin Maximum Strength Ointment 4 (oz) Topical Primary Dressing Hydrofera Blue Ready Transfer Foam, 4x5 (in/in) Discharge Instruction: Apply Hydrofera Blue Ready to wound bed as directed Secondary Dressing Xtrasorb Large 6x9 (in/in) Discharge Instruction: Apply to wound as directed. Do not cut. Secured With Compression Wrap Profore Lite LF 3 Multilayer Compression Bandaging System Discharge Instruction: Apply 3 multi-layer wrap as prescribed. Compression Stockings Add-Ons Electronic Signature(s) Signed: 01/18/2021 4:14:13 PM By: Jeanine Luz Signed: 01/18/2021 5:03:53 PM By: Georges Mouse, Minus Breeding RN Entered By: Jeanine Luz on 01/18/2021 14:52:57 [name] ([id number]) -------------------------------------------------------------------------------- Vitals Details Patient Name: [name] Date of Service: 01/18/2021 2:30 PM Medical Record Number: [id number] Patient Account Number: [id number] Date of Birth/Sex: Apr 10, 1951 (69 y.o. M) Treating RN: Dolan Amen Primary Care : Barney Drain Other Clinician: Referring : Barney Drain Treating /Extender: Skipper Cliche in Treatment: 4 Vital Signs Time Taken: 14:35 Temperature (F): 98.2 Height (in): 67 Pulse (bpm): 63 Weight (lbs): 170 Respiratory Rate (breaths/min): 18 Body Mass Index (BMI): 26.6 Blood Pressure (mmHg): 150/67 Reference Range: 80 - 120 mg / dl Electronic Signature(s) Signed: 01/18/2021 4:14:13 PM By: Jeanine Luz Entered By: Jeanine Luz on 01/18/2021 14:39:02

## 2021-01-19 NOTE — Progress Notes (Signed)
AUGIE, VANE ([id number]) Visit Report for 01/14/2021 Arrival Information Details Patient Name: [name], [name] Date of Service: 01/14/2021 12:30 PM Medical Record Number: [id number] Patient Account Number: [id number] Date of Birth/Sex: Jan 09, 1951 (69 y.o. M) Treating RN: Carlene Coria Primary Care Aleyna Cueva: Barney Drain Other Clinician: Referring Meliza Kage: Barney Drain Treating Chrles Selley/Extender: Skipper Cliche in Treatment: 3 Visit Information History Since Last Visit All ordered tests and consults were completed: No Patient Arrived: Ambulatory Added or deleted any medications: No Arrival Time: 13:17 Any new allergies or adverse reactions: No Accompanied By: self Had a fall or experienced change in No Transfer Assistance: None activities of daily living that may affect Patient Identification Verified: Yes risk of falls: Secondary Verification Process Completed: Yes Signs or symptoms of abuse/neglect since last visito No Patient Requires Transmission-Based No Hospitalized since last visit: No Precautions: Implantable device outside of the clinic excluding No Patient Has Alerts: Yes cellular tissue based products placed in the center Patient Alerts: NOT Diabetic since last visit: ABI: R)0.91 L) 1.18 Has Dressing in Place as Prescribed: Yes TBI R) 0.76 L) 0.73 Has Compression in Place as Prescribed: Yes 12/02/2020 [facility] Vascular Pain Present Now: Yes Electronic Signature(s) Signed: 01/18/2021 4:52:45 PM By: Carlene Coria RN Entered By: Carlene Coria on 01/14/2021 13:17:26 [name] ([id number]) -------------------------------------------------------------------------------- Clinic Level of Care Assessment Details Patient Name: [name] Date of Service: 01/14/2021 12:30 PM Medical Record Number: [id number] Patient Account Number: [id number] Date of Birth/Sex: 01-May-1951 (69 y.o. M) Treating RN: Carlene Coria Primary Care Lilyian Quayle: Barney Drain  Other Clinician: Referring Taneesha Edgin: Barney Drain Treating Lorelai Huyser/Extender: Skipper Cliche in Treatment: 3 Clinic Level of Care Assessment Items TOOL 1 Quantity Score []  - Use when EandM and Procedure is performed on INITIAL visit 0 ASSESSMENTS - Nursing Assessment / Reassessment []  - General Physical Exam (combine w/ comprehensive assessment (listed just below) when performed on new 0 pt. evals) []  - 0 Comprehensive Assessment (HX, ROS, Risk Assessments, Wounds Hx, etc.) ASSESSMENTS - Wound and Skin Assessment / Reassessment []  - Dermatologic / Skin Assessment (not related to wound area) 0 ASSESSMENTS - Ostomy and/or Continence Assessment and Care []  - Incontinence Assessment and Management 0 []  - 0 Ostomy Care Assessment and Management (repouching, etc.) PROCESS - Coordination of Care []  - Simple Patient / Family Education for ongoing care 0 []  - 0 Complex (extensive) Patient / Family Education for ongoing care []  - 0 Staff obtains Programmer, systems, Records, Test Results / Process Orders []  - 0 Staff telephones HHA, Nursing Homes / Clarify orders / etc []  - 0 Routine Transfer to another Facility (non-emergent condition) []  - 0 Routine Hospital Admission (non-emergent condition) []  - 0 New Admissions / Biomedical engineer / Ordering NPWT, Apligraf, etc. []  - 0 Emergency Hospital Admission (emergent condition) PROCESS - Special Needs []  - Pediatric / Minor Patient Management 0 []  - 0 Isolation Patient Management []  - 0 Hearing / Language / Visual special needs []  - 0 Assessment of Community assistance (transportation, D/C planning, etc.) []  - 0 Additional assistance / Altered mentation []  - 0 Support Surface(s) Assessment (bed, cushion, seat, etc.) INTERVENTIONS - Miscellaneous []  - External ear exam 0 []  - 0 Patient Transfer (multiple staff / Civil Service fast streamer / Similar devices) []  - 0 Simple Staple / Suture removal (25 or less) []  - 0 Complex Staple / Suture  removal (26 or more) []  - 0 Hypo/Hyperglycemic Management (do not check if billed separately) []  - 0 Ankle / Brachial Index (ABI) - do  not check if billed separately Has the patient been seen at the hospital within the last three years: Yes Total Score: 0 Level Of Care: ____ [name] ([id number]) Electronic Signature(s) Signed: 01/18/2021 4:52:45 PM By: Carlene Coria RN Entered By: Carlene Coria on 01/14/2021 13:19:29 [name] ([id number]) -------------------------------------------------------------------------------- Compression Therapy Details Patient Name: [name] Date of Service: 01/14/2021 12:30 PM Medical Record Number: [id number] Patient Account Number: [id number] Date of Birth/Sex: 1950-12-26 (69 y.o. M) Treating RN: Carlene Coria Primary Care Oziel Beitler: Barney Drain Other Clinician: Referring Rae Plotner: Barney Drain Treating Thara Searing/Extender: Skipper Cliche in Treatment: 3 Compression Therapy Performed for Wound Assessment: Wound #1 Right,Circumferential Lower Leg Performed By: Clinician Carlene Coria, RN Compression Type: Three Layer Electronic Signature(s) Signed: 01/18/2021 4:52:45 PM By: Carlene Coria RN Entered By: Carlene Coria on 01/14/2021 13:18:16 [name] ([id number]) -------------------------------------------------------------------------------- Encounter Discharge Information Details Patient Name: [name] Date of Service: 01/14/2021 12:30 PM Medical Record Number: [id number] Patient Account Number: [id number] Date of Birth/Sex: 02-02-1951 (69 y.o. M) Treating RN: Carlene Coria Primary Care Larayne Baxley: Barney Drain Other Clinician: Referring Ralf Konopka: Barney Drain Treating Attallah Ontko/Extender: Skipper Cliche in Treatment: 3 Encounter Discharge Information Items Discharge Condition: Stable Ambulatory Status: Cane Discharge Destination: Home Transportation: Private Auto Accompanied By: self Schedule  Follow-up Appointment: Yes Clinical Summary of Care: Patient Declined Electronic Signature(s) Signed: 01/18/2021 4:52:45 PM By: Carlene Coria RN Entered By: Carlene Coria on 01/14/2021 13:19:22 [name] ([id number]) -------------------------------------------------------------------------------- Pain Assessment Details Patient Name: [name] Date of Service: 01/14/2021 12:30 PM Medical Record Number: [id number] Patient Account Number: [id number] Date of Birth/Sex: 1951/07/10 (70 y.o. M) Treating RN: Carlene Coria Primary Care Eason Housman: Barney Drain Other Clinician: Referring Franceen Erisman: Barney Drain Treating Byard Carranza/Extender: Skipper Cliche in Treatment: 3 Active Problems Location of Pain Severity and Description of Pain Patient Has Paino Yes Site Locations With Dressing Change: Yes Duration of the Pain. Constant / Intermittento Intermittent How Long Does it Lasto Hours: 1 Minutes: Rate the pain. Current Pain Level: 4 Worst Pain Level: 5 Least Pain Level: 0 Tolerable Pain Level: 5 Character of Pain Describe the Pain: Burning Pain Management and Medication Current Pain Management: Medication: No Cold Application: No Rest: Yes Massage: No Activity: No T.E.N.S.: No Heat Application: No Leg drop or elevation: No Is the Current Pain Management Adequate: Inadequate How does your wound impact your activities of daily livingo Sleep: Yes Bathing: No Appetite: No Relationship With Others: No Bladder Continence: No Emotions: No Bowel Continence: No Work: No Toileting: No Drive: No Dressing: No Hobbies: No Electronic Signature(s) Signed: 01/18/2021 4:52:45 PM By: Carlene Coria RN Entered By: Carlene Coria on 01/14/2021 13:18:58 [name] ([id number]) -------------------------------------------------------------------------------- Wound Assessment Details Patient Name: [name] Date of Service: 01/14/2021 12:30 PM Medical Record  Number: [id number] Patient Account Number: [id number] Date of Birth/Sex: Jul 14, 1951 (69 y.o. M) Treating RN: Carlene Coria Primary Care Nethra Mehlberg: Barney Drain Other Clinician: Referring Elvena Oyer: Barney Drain Treating Lenford Beddow/Extender: Skipper Cliche in Treatment: 3 Wound Status Wound Number: 1 Primary Etiology: Trauma, Other Wound Location: Right, Circumferential Lower Leg Secondary Venous Leg Ulcer Etiology: Wounding Event: Trauma Wound Status: Open Date Acquired: 10/20/2020 Comorbid History: Hypertension, History of pressure wounds, Weeks Of Treatment: 3 Neuropathy Clustered Wound: No Wound Measurements Length: (cm) 13 Width: (cm) 27.2 Depth: (cm) 0.2 Area: (cm) 277.717 Volume: (cm) 55.543 % Reduction in Area: -391.1% % Reduction in Volume: -391.1% Epithelialization: None Tunneling: No Undermining: No Wound Description Classification: Full Thickness Without Exposed Support Structures  Wound Margin: Indistinct, nonvisible Exudate Amount: Large Exudate Type: Serous Exudate Color: amber Foul Odor After Cleansing: No Slough/Fibrino Yes Wound Bed Granulation Amount: Medium (34-66%) Exposed Structure Granulation Quality: Pink, Hyper-granulation Fascia Exposed: No Necrotic Amount: Medium (34-66%) Fat Layer (Subcutaneous Tissue) Exposed: Yes Necrotic Quality: Adherent Slough Tendon Exposed: No Muscle Exposed: No Joint Exposed: No Bone Exposed: No Electronic Signature(s) Signed: 01/18/2021 4:52:45 PM By: Carlene Coria RN Entered By: Carlene Coria on 01/14/2021 13:17:58

## 2021-01-21 ENCOUNTER — Other Ambulatory Visit: Payer: Self-pay

## 2021-01-21 DIAGNOSIS — S81801A Unspecified open wound, right lower leg, initial encounter: Secondary | ICD-10-CM | POA: Diagnosis not present

## 2021-01-25 ENCOUNTER — Other Ambulatory Visit: Payer: Self-pay

## 2021-01-25 ENCOUNTER — Encounter: Payer: Medicare (Managed Care) | Admitting: Physician Assistant

## 2021-01-25 DIAGNOSIS — S81801A Unspecified open wound, right lower leg, initial encounter: Secondary | ICD-10-CM | POA: Diagnosis not present

## 2021-01-25 NOTE — Progress Notes (Addendum)
[name], [name] ([id number]) Visit Report for 01/25/2021 Chief Complaint Document Details Patient Name: [name], [name] Date of Service: 01/25/2021 10:15 AM Medical Record Number: [id number] Patient Account Number: [id number] Date of Birth/Sex: May 19, 1951 (69 y.o. M) Treating RN: Dolan Amen Primary Care Provider: Barney Drain Other Clinician: Jeanine Luz Referring Provider: Barney Drain Treating Provider/Extender: Skipper Cliche in Treatment: 5 Information Obtained from: Patient Chief Complaint Right LE Ulcer Electronic Signature(s) Signed: 01/25/2021 10:51:13 AM By: Worthy Keeler PA-C Entered By: Worthy Keeler on 01/25/2021 10:51:13 [name] ([id number]) -------------------------------------------------------------------------------- HPI Details Patient Name: [name] Date of Service: 01/25/2021 10:15 AM Medical Record Number: [id number] Patient Account Number: [id number] Date of Birth/Sex: May 15, 1951 (69 y.o. M) Treating RN: Dolan Amen Primary Care Provider: Barney Drain Other Clinician: Jeanine Luz Referring Provider: Barney Drain Treating Provider/Extender: Skipper Cliche in Treatment: 5 History of Present Illness HPI Description: 12/21/2020 upon evaluation today patient appears to be doing somewhat poorly in regard to his right lower extremity. He does have some erythema and is also having issues with quite a bit of drainage currently from a wound that he tells me has been present since about 2 months ago when he initially had an injury that led to this wound beginning. He does have chronic venous stasis and is secondary to this I think it has been more difficult to heal this wound than normal. With that being said the patient tells me that this tends to drain a lot down onto his feet. He is also had some issues with fungal infection he is currently on miconazole powder for that. Fortunately there is no signs of active  infection systemically at this time. No fevers, chills, nausea, vomiting, or diarrhea. He does have pain but again this is more to the posterior aspect not really more lateral or anterior. There is evidence that he may have some local infection due to erythema and warmth surrounding the wound post debridement I do want to obtain a wound culture to evaluate and see if there is anything indeed of concern here. He did have arterial studies which revealed that he had a ABI of 0.91 on the right with a TBI of 0.76 in the left his ABI was 1.18 with a TBI of 0.73. He has good blood flow here. In regard to his x-ray that was taken on 11/30/2020 there was no evidence of osseous abnormality. The arterial study was actually performed on 12/03/2020. The patient was hospitalized from 11/30/2020 through 12/02/2020 during that time he was on IV antibiotics including vancomycin and cefepime. 12/28/2020 upon evaluation today patient appears to be doing somewhat poorly in regard to his legs currently is having a lot of drainage unfortunately which again he was not actually being wrapped with a 3 layer compression wrap after this was changed at [facility]. Subsequently I think that the patient does need to have ongoing compression therapy. Right now that means that were doing the compression wrap later he will need compression stockings. Either way though I think that he cannot just have a border foam dressing on this which was what was on today that is not sufficient to catch any of the drainage and to be honest I think this can lead to infection and greater issues here for him. The wound is pretty much about circumferential at this point a weeping area included. 01/04/2021 upon evaluation today patient unfortunately is doing quite a bit worse in regard to the overall appearance of his wound from the  standpoint of infection. I did see evidence of bright green drainage consistent with Pseudomonas which I discussed with the patient  today as well as his representative from social services. Nonetheless I believe that we likely need to treat him for Pseudomonas right now the clindamycin he is on is not covering for this. With that being said no sharp debridement was performed today. Of note the patient's Hydrofera Blue dressing was not the ready transfer version and unfortunately did cause this did not drain quite as well as we would like to. Nonetheless I do feel like that the patient would benefit from continuing with the wraps changed out their pace I think they can definitely do a good job and his swelling is under good control the big thing here is that we need to make sure that the dressing is applied appropriately we will contact them just to confirm and make sure that they know the appropriate way to apply the [facility] that they have. 01/11/2021 upon evaluation today patient appears to be doing somewhat better in regard to the erythema on his leg at this point. Fortunately there are no signs of active infection at this time. No fevers, chills, nausea, vomiting, or diarrhea. With that being said he has been tolerating the dressing changes without complication and very pleased in that regard. I do think that he seems to be making good progress in general which is good news. I think that the biggest issue I see right now is simply the [facility] that is being used cannot transfer through to the [place] and therefore extracting a lot more fluid against the skin which is causing some irritation as well. I think that we may be able to wrap him here and get things in a better position for him to be honest. 01/18/2021 upon evaluation today patient appears to be doing better in regard to his leg. I feel like he is definitely making improvement and overall I think that this is significantly improved compared to the previous evaluation. With that being said there is still quite a ways to go to get this to heal and it is going  to take quite a bit of time. Fortunately there is no evidence of active infection at this time. No fevers, chills, nausea, vomiting, or diarrhea. 01/25/2021 upon evaluation today patient appears to be doing well with regard to his leg. I feel like he is definitely showing signs of improvement which is great news. There is no evidence of active infection at this time. No fevers, chills, nausea, vomiting, or diarrhea. Electronic Signature(s) Signed: 01/25/2021 11:05:56 AM By: Worthy Keeler PA-C Entered By: Worthy Keeler on 01/25/2021 11:05:56 [name] ([id number]) -------------------------------------------------------------------------------- Physical Exam Details Patient Name: [name] Date of Service: 01/25/2021 10:15 AM Medical Record Number: [id number] Patient Account Number: [id number] Date of Birth/Sex: [birth year] (69 y.o. M) Treating RN: Dolan Amen Primary Care Provider: Barney Drain Other Clinician: Jeanine Luz Referring Provider: Barney Drain Treating Provider/Extender: Skipper Cliche in Treatment: 5 Constitutional Well-nourished and well-hydrated in no acute distress. Respiratory normal breathing without difficulty. Psychiatric this patient is able to make decisions and demonstrates good insight into disease process. Alert and Oriented x 3. pleasant and cooperative. Notes Patient's wound bed showed signs of good granulation and epithelization. I feel like he is doing much better the infection definitely appears to be under control and I do not see any signs of anything worsening in general. Overall I am pleased with the progress.  Electronic Signature(s) Signed: 01/25/2021 11:06:16 AM By: Worthy Keeler PA-C Entered By: Worthy Keeler on 01/25/2021 11:06:15 [name] ([id number]) -------------------------------------------------------------------------------- Physician Orders Details Patient Name: [name] Date of Service:  01/25/2021 10:15 AM Medical Record Number: [id number] Patient Account Number: [id number] Date of Birth/Sex: 1951-08-23 (69 y.o. 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 o Nurse Visit as needed Bathing/ Shower/ Hygiene Wound #1 Right,Circumferential Lower Leg o May shower with wound dressing protected with water repellent cover or cast protector. Edema Control - Lymphedema / Segmental Compressive Device / Other Right Lower Extremity o Optional: One layer of unna paste to top of compression wrap (to act as an anchor). o 3 Layer Compression System for Lymphedema. Wound Treatment Wound #1 - Lower Leg Wound Laterality: Right, Circumferential Cleanser: Normal Saline 2 x Per Week/30 Days Discharge Instructions: Wash your hands with soap and water. Remove old dressing, discard into plastic bag and place into trash. Cleanse the wound with Normal Saline prior to applying a clean dressing using gauze sponges, not tissues or cotton balls. Do not scrub or use excessive force. Pat dry using gauze sponges, not tissue or cotton balls. Cleanser: Soap and Water 2 x Per Week/30 Days Discharge Instructions: Gently cleanse wound with antibacterial soap, rinse and pat dry prior to dressing wounds Peri-Wound Care: Desitin Maximum Strength Ointment 4 (oz) 2 x Per Week/30  Days Primary Dressing: Hydrofera Blue Ready Transfer Foam, 4x5 (in/in) 2 x Per Week/30 Days Discharge Instructions: Apply Hydrofera Blue Ready to wound bed as directed Secondary Dressing: Xtrasorb Large 6x9 (in/in) 2 x Per Week/30 Days Discharge Instructions: Apply to wound as directed. Do not cut. Compression Wrap: Profore Lite LF 3 Multilayer Compression Bandaging System 2 x Per Week/30 Days Discharge Instructions: Apply 3 multi-layer wrap as prescribed. Electronic Signature(s) Signed: 01/26/2021 8:14:32 AM By: Worthy Keeler PA-C Signed: 01/26/2021 12:09:52 PM By: Carlene Coria RN Entered By: Carlene Coria on 01/25/2021 11:15:35 [name] ([id number]) -------------------------------------------------------------------------------- Problem List Details Patient Name: [name] Date of Service: 01/25/2021 10:15 AM Medical Record Number: [id number] Patient Account Number: [id number] Date of Birth/Sex: 23-Jun-1951 (69 y.o. 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 M) Treating  RN: Dolan Amen Primary Care Provider: Barney Drain Other Clinician: Jeanine Luz Referring Provider: Barney Drain Treating Provider/Extender: Skipper Cliche in Treatment: 5 Subjective Chief Complaint Information obtained from Patient Right LE Ulcer History of Present Illness (HPI) 12/21/2020 upon evaluation today patient appears to be doing somewhat poorly in regard to his right lower extremity. He does have some erythema and is also having issues with quite a bit of drainage currently from a wound that he tells me has been present since about 2 months ago when he initially had an injury that led to this wound beginning. He does have chronic venous stasis and is secondary to this I think it has been more difficult to heal this wound than normal. With that being said the patient tells me that this tends to drain a lot down onto his feet. He is also had some issues with fungal infection he is currently on miconazole powder for that. Fortunately there is no signs of active infection systemically at this time. No fevers, chills, nausea, vomiting, or diarrhea. He does have pain but again this is more to the posterior aspect not really more lateral or anterior. There is evidence that he may have some local infection due to erythema and warmth surrounding the wound post debridement I do want to obtain a wound culture to evaluate and see if there is anything indeed of concern here. He did have arterial studies which revealed that he had a ABI of 0.91 on the right with a TBI of 0.76 in the left his ABI was 1.18 with a TBI of 0.73. He has good blood flow here. In regard to his x-ray that was taken on 11/30/2020 there was no evidence of osseous abnormality. The arterial study was actually performed  on 12/03/2020. The patient was hospitalized from 11/30/2020 through 12/02/2020 during that time he was on IV antibiotics including vancomycin and cefepime. 12/28/2020 upon evaluation today patient appears to be doing somewhat poorly in regard to his legs currently is having a lot of drainage unfortunately which again he was not actually being wrapped with a 3 layer compression wrap after this was changed at [facility]. Subsequently I think that the patient does need to have ongoing compression therapy. Right now that means that were doing the compression wrap later he will need compression stockings. Either way though I think that he cannot just have a border foam dressing on this which was what was on today that is not sufficient to catch any of the drainage and to be honest I think this can lead to infection and greater issues here for him. The wound is pretty much about circumferential at this point a weeping area included. 01/04/2021 upon evaluation today patient unfortunately is doing quite a bit worse in regard to the overall appearance of his wound from the standpoint of infection. I did see evidence of bright green drainage consistent with Pseudomonas which I discussed with the patient today as well as his representative from social services. Nonetheless I believe that we likely need to treat him for Pseudomonas right now the clindamycin he is on is not covering for this. With that being said no sharp debridement was performed today. Of note the patient's Hydrofera Blue dressing was not the ready transfer version and unfortunately did cause this did not drain quite as well as we would like to. Nonetheless I do feel like that the patient would benefit from continuing with the wraps changed out their pace I think they can definitely  do a good job and his swelling is under good control the big thing here is that we need to make sure that the dressing is applied appropriately we will contact them just to  confirm and make sure that they know the appropriate way to apply the [facility] that they have. 01/11/2021 upon evaluation today patient appears to be doing somewhat better in regard to the erythema on his leg at this point. Fortunately there are no signs of active infection at this time. No fevers, chills, nausea, vomiting, or diarrhea. With that being said he has been tolerating the dressing changes without complication and very pleased in that regard. I do think that he seems to be making good progress in general which is good news. I think that the biggest issue I see right now is simply the [facility] that is being used cannot transfer through to the [place] and therefore extracting a lot more fluid against the skin which is causing some irritation as well. I think that we may be able to wrap him here and get things in a better position for him to be honest. 01/18/2021 upon evaluation today patient appears to be doing better in regard to his leg. I feel like he is definitely making improvement and overall I think that this is significantly improved compared to the previous evaluation. With that being said there is still quite a ways to go to get this to heal and it is going to take quite a bit of time. Fortunately there is no evidence of active infection at this time. No fevers, chills, nausea, vomiting, or diarrhea. 01/25/2021 upon evaluation today patient appears to be doing well with regard to his leg. I feel like he is definitely showing signs of improvement which is great news. There is no evidence of active infection at this time. No fevers, chills, nausea, vomiting, or diarrhea. Objective Constitutional Well-nourished and well-hydrated in no acute distress. Vitals Time Taken: 10:27 AM, Height: 67 in, Weight: 170 lbs, BMI: 26.6, Temperature: 97.8 F, Pulse: 68 bpm, Respiratory Rate: 18 breaths/min, Leachman, Jacque C. ([id number]) Blood Pressure: 125/77 mmHg. Respiratory normal  breathing without difficulty. Psychiatric this patient is able to make decisions and demonstrates good insight into disease process. Alert and Oriented x 3. pleasant and cooperative. General Notes: Patient's wound bed showed signs of good granulation and epithelization. I feel like he is doing much better the infection definitely appears to be under control and I do not see any signs of anything worsening in general. Overall I am pleased with the progress. Integumentary (Hair, Skin) Wound #1 status is Open. Original cause of wound was Trauma. The date acquired was: 10/20/2020. The wound has been in treatment 5 weeks. The wound is located on the Right,Circumferential Lower Leg. The wound measures 8.5cm length x 22cm width x 0.2cm depth; 146.869cm^2 area and 29.374cm^3 volume. There is Fat Layer (Subcutaneous Tissue) exposed. There is no tunneling or undermining noted. There is a large amount of serous drainage noted. The wound margin is indistinct and nonvisible. There is medium (34-66%) pink, hyper - granulation within the wound bed. There is a medium (34-66%) amount of necrotic tissue within the wound bed including Adherent Slough. 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 There was a Three Layer Compression Therapy Procedure by Carlene Coria, RN. Post procedure Diagnosis Wound #1: Same as Pre-Procedure Plan 1. Would recommend currently that we going continue with the wound care measures as before the patient is in agreement with plan that includes the use of the [facility]. We are using zinc around the leg to  cover everything else with good skin to protect from drainage. 2. I am also can recommend that we go ahead and continue with the compression wrapping this seems to be doing an excellent job for him. 3. I am good recommend as well the patient likely needs compression stockings for the left leg and then once everything heals the right leg as well. The caseworker is actually to get in touch with [facility] and see what they can do in that regard we will be happy to help in any way we can. We will see patient back for reevaluation in 1 week here in the clinic. If anything worsens or changes patient will contact our office for additional recommendations. Electronic Signature(s) Signed: 01/25/2021 11:06:58 AM By: Worthy Keeler PA-C Entered By: Worthy Keeler on 01/25/2021 11:06:58 [name] ([id number]) -------------------------------------------------------------------------------- SuperBill Details Patient Name: [name] Date of Service: 01/25/2021 Medical Record Number: [id number] Patient Account Number: [id number] Date of Birth/Sex: 04/10/1951 (69 y.o. 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 intellectual disabilities Facility Procedures CPT4 Code: 83254982 Description: (Facility Use Only) [phone] - [place] LWR RT LEG Modifier: Quantity: 1 Physician Procedures CPT4 Code: 9407680 Description: 88110 - WC PHYS LEVEL 3 - EST PT Modifier: Quantity: 1 CPT4 Code: Description: ICD-10  Diagnosis Description S81.801A Unspecified open wound, right lower leg, initial encounter I87.2 Venous insufficiency (chronic) (peripheral) L97.812 Non-pressure chronic ulcer of other part of right lower leg with fat la Z87.820  Personal history of traumatic brain injury Modifier: yer exposed Quantity: Electronic Signature(s) Signed: 01/26/2021 8:14:32 AM By: Worthy Keeler PA-C Signed: 01/26/2021 12:09:52 PM By: Carlene Coria RN Previous Signature: 01/25/2021 11:07:11 AM Version By: Worthy Keeler PA-C Entered By: Carlene Coria on 01/25/2021 11:19:17

## 2021-01-26 NOTE — Progress Notes (Signed)
EIDAN, MUELLNER ([id number]) Visit Report for 01/21/2021 Arrival Information Details Patient Name: [name] Date of Service: 01/21/2021 1:00 PM Medical Record Number: [id number] Patient Account Number: [id number] Date of Birth/Sex: 30-Apr-1951 (69 y.o. M) Treating RN: Carlene Coria Primary Care Addaleigh Nicholls: Barney Drain Other Clinician: Referring Kazue Cerro: Barney Drain Treating Justus Duerr/Extender: Skipper Cliche in Treatment: 4 Visit Information History Since Last Visit All ordered tests and consults were completed: No Patient Arrived: Ambulatory Added or deleted any medications: No Arrival Time: 12:25 Any new allergies or adverse reactions: No Accompanied By: self Had a fall or experienced change in No Transfer Assistance: None activities of daily living that may affect Patient Identification Verified: Yes risk of falls: Secondary Verification Process Completed: Yes Signs or symptoms of abuse/neglect since last visito No Patient Requires Transmission-Based No Hospitalized since last visit: No Precautions: Implantable device outside of the clinic excluding No Patient Has Alerts: Yes cellular tissue based products placed in the center Patient Alerts: NOT Diabetic since last visit: ABI: R)0.91 L) 1.18 Has Dressing in Place as Prescribed: Yes TBI R) 0.76 L) 0.73 Has Compression in Place as Prescribed: Yes 12/02/2020 [facility] Pain Present Now: No Electronic Signature(s) Signed: 01/26/2021 12:09:52 PM By: Carlene Coria RN Entered By: Carlene Coria on 01/21/2021 12:25:33 [name] ([id number]) -------------------------------------------------------------------------------- Clinic Level of Care Assessment Details Patient Name: [name] Date of Service: 01/21/2021 1:00 PM Medical Record Number: [id number] Patient Account Number: [id number] Date of Birth/Sex: Jul 17, 1951 (69 y.o. M) Treating RN: Carlene Coria Primary Care Shaka Zech: Barney Drain Other  Clinician: Referring Laverle Pillard: Barney Drain Treating Deundre Thong/Extender: Skipper Cliche in Treatment: 4 Clinic Level of Care Assessment Items TOOL 1 Quantity Score []  - Use when EandM and Procedure is performed on INITIAL visit 0 ASSESSMENTS - Nursing Assessment / Reassessment []  - General Physical Exam (combine w/ comprehensive assessment (listed just below) when performed on new 0 pt. evals) []  - 0 Comprehensive Assessment (HX, ROS, Risk Assessments, Wounds Hx, etc.) ASSESSMENTS - Wound and Skin Assessment / Reassessment []  - Dermatologic / Skin Assessment (not related to wound area) 0 ASSESSMENTS - Ostomy and/or Continence Assessment and Care []  - Incontinence Assessment and Management 0 []  - 0 Ostomy Care Assessment and Management (repouching, etc.) PROCESS - Coordination of Care []  - Simple Patient / Family Education for ongoing care 0 []  - 0 Complex (extensive) Patient / Family Education for ongoing care []  - 0 Staff obtains Programmer, systems, Records, Test Results / Process Orders []  - 0 Staff telephones HHA, Nursing Homes / Clarify orders / etc []  - 0 Routine Transfer to another Facility (non-emergent condition) []  - 0 Routine Hospital Admission (non-emergent condition) []  - 0 New Admissions / Biomedical engineer / Ordering NPWT, Apligraf, etc. []  - 0 Emergency Hospital Admission (emergent condition) PROCESS - Special Needs []  - Pediatric / Minor Patient Management 0 []  - 0 Isolation Patient Management []  - 0 Hearing / Language / Visual special needs []  - 0 Assessment of Community assistance (transportation, D/C planning, etc.) []  - 0 Additional assistance / Altered mentation []  - 0 Support Surface(s) Assessment (bed, cushion, seat, etc.) INTERVENTIONS - Miscellaneous []  - External ear exam 0 []  - 0 Patient Transfer (multiple staff / Civil Service fast streamer / Similar devices) []  - 0 Simple Staple / Suture removal (25 or less) []  - 0 Complex Staple / Suture removal  (26 or more) []  - 0 Hypo/Hyperglycemic Management (do not check if billed separately) []  - 0 Ankle / Brachial Index (ABI) - do  not check if billed separately Has the patient been seen at the hospital within the last three years: Yes Total Score: 0 Level Of Care: ____ [name] ([id number]) Electronic Signature(s) Signed: 01/26/2021 12:09:52 PM By: Carlene Coria RN Entered By: Carlene Coria on 01/21/2021 12:41:27 [name] ([id number]) -------------------------------------------------------------------------------- Compression Therapy Details Patient Name: [name] Date of Service: 01/21/2021 1:00 PM Medical Record Number: [id number] Patient Account Number: [id number] Date of Birth/Sex: 1951/10/03 (69 y.o. M) Treating RN: Carlene Coria Primary Care Ledon Weihe: Barney Drain Other Clinician: Referring Adele Milson: Barney Drain Treating Araceli Arango/Extender: Skipper Cliche in Treatment: 4 Compression Therapy Performed for Wound Assessment: Wound #1 Right,Circumferential Lower Leg Performed By: Clinician Carlene Coria, RN Compression Type: Three Layer Electronic Signature(s) Signed: 01/26/2021 12:09:52 PM By: Carlene Coria RN Entered By: Carlene Coria on 01/21/2021 12:40:23 [name] ([id number]) -------------------------------------------------------------------------------- Encounter Discharge Information Details Patient Name: [name] Date of Service: 01/21/2021 1:00 PM Medical Record Number: [id number] Patient Account Number: [id number] Date of Birth/Sex: 01-Apr-1951 (69 y.o. M) Treating RN: Carlene Coria Primary Care Monicka Cyran: Barney Drain Other Clinician: Referring Carols Clemence: Barney Drain Treating Haziel Molner/Extender: Skipper Cliche in Treatment: 4 Encounter Discharge Information Items Discharge Condition: Stable Ambulatory Status: Ambulatory Discharge Destination: Home Transportation: Private Auto Accompanied By: self Schedule Follow-up  Appointment: Yes Clinical Summary of Care: Patient Declined Electronic Signature(s) Signed: 01/26/2021 12:09:52 PM By: Carlene Coria RN Entered By: Carlene Coria on 01/21/2021 12:40:53 [name] ([id number]) -------------------------------------------------------------------------------- Wound Assessment Details Patient Name: [name] Date of Service: 01/21/2021 1:00 PM Medical Record Number: [id number] Patient Account Number: [id number] Date of Birth/Sex: 01/19/1951 (69 y.o. M) Treating RN: Carlene Coria Primary Care Nalaya Wojdyla: Barney Drain Other Clinician: Referring Jemiah Cuadra: Barney Drain Treating Breane Grunwald/Extender: Skipper Cliche in Treatment: 4 Wound Status Wound Number: 1 Primary Etiology: Trauma, Other Wound Location: Right, Circumferential Lower Leg Secondary Venous Leg Ulcer Etiology: Wounding Event: Trauma Wound Status: Open Date Acquired: 10/20/2020 Comorbid History: Hypertension, History of pressure wounds, Weeks Of Treatment: 4 Neuropathy Clustered Wound: No Wound Measurements Length: (cm) 9 Width: (cm) 22 Depth: (cm) 0.2 Area: (cm) 155.509 Volume: (cm) 31.102 % Reduction in Area: -175% % Reduction in Volume: -175% Epithelialization: None Tunneling: No Undermining: No Wound Description Classification: Full Thickness Without Exposed Support Structu Wound Margin: Indistinct, nonvisible Exudate Amount: Large Exudate Type: Serous Exudate Color: amber res Foul Odor After Cleansing: No Slough/Fibrino Yes Wound Bed Granulation Amount: Medium (34-66%) Exposed Structure Granulation Quality: Pink, Hyper-granulation Fascia Exposed: No Necrotic Amount: Medium (34-66%) Fat Layer (Subcutaneous Tissue) Exposed: Yes Necrotic Quality: Adherent Slough Tendon Exposed: No Muscle Exposed: No Joint Exposed: No Bone Exposed: No Electronic Signature(s) Signed: 01/26/2021 12:09:52 PM By: Carlene Coria RN Entered By: Carlene Coria on 01/21/2021 12:40:04

## 2021-01-26 NOTE — Progress Notes (Signed)
CARNELL, BEAVERS ([id number]) Visit Report for 01/25/2021 Arrival Information Details Patient Name: [name], [name] Date of Service: 01/25/2021 10:15 AM Medical Record Number: [id number] Patient Account Number: [id number] Date of Birth/Sex: [birth year] (69 y.o. M) Treating RN: Dolan Amen Primary Care Alonda Weaber: Barney Drain Other Clinician: Jeanine Luz Referring Nini Cavan: Barney Drain Treating Makahla Kiser/Extender: Skipper Cliche in Treatment: 5 Visit Information History Since Last Visit Added or deleted any medications: No Patient Arrived: [name] Had a fall or experienced change in No Arrival Time: 10:25 activities of daily living that may affect Accompanied By: social woker risk of falls: Transfer Assistance: None Hospitalized since last visit: No Patient Identification Verified: Yes Pain Present Now: Yes Secondary Verification Process Completed: Yes Patient Requires Transmission-Based No Precautions: Patient Has Alerts: Yes Patient Alerts: NOT Diabetic ABI: R)0.91 L) 1.18 TBI R) 0.76 L) 0.73 12/02/2020 [facility] Vascular Electronic Signature(s) Signed: 01/26/2021 8:01:22 AM By: Jeanine Luz Entered By: Jeanine Luz on 01/25/2021 10:28:21 [name] ([id number]) -------------------------------------------------------------------------------- Clinic Level of Care Assessment Details Patient Name: [name] Date of Service: 01/25/2021 10:15 AM Medical Record Number: [id number] Patient Account Number: [id number] Date of Birth/Sex: October 07, 1951 (69 y.o. M) Treating RN: Carlene Coria Primary Care Slayde Brault: Barney Drain Other Clinician: Jeanine Luz Referring Thao Bauza: Barney Drain Treating Brenen Beigel/Extender: Skipper Cliche in Treatment: 5 Clinic Level of Care Assessment Items TOOL 1 Quantity Score '[]'  - Use when EandM and Procedure is performed on INITIAL visit 0 ASSESSMENTS - Nursing Assessment / Reassessment '[]'  - General Physical Exam  (combine w/ comprehensive assessment (listed just below) when performed on new 0 pt. evals) '[]'  - 0 Comprehensive Assessment (HX, ROS, Risk Assessments, Wounds Hx, etc.) ASSESSMENTS - Wound and Skin Assessment / Reassessment '[]'  - Dermatologic / Skin Assessment (not related to wound area) 0 ASSESSMENTS - Ostomy and/or Continence Assessment and Care '[]'  - Incontinence Assessment and Management 0 '[]'  - 0 Ostomy Care Assessment and Management (repouching, etc.) PROCESS - Coordination of Care '[]'  - Simple Patient / Family Education for ongoing care 0 '[]'  - 0 Complex (extensive) Patient / Family Education for ongoing care '[]'  - 0 Staff obtains Programmer, systems, Records, Test Results / Process Orders '[]'  - 0 Staff telephones HHA, Nursing Homes / Clarify orders / etc '[]'  - 0 Routine Transfer to another Facility (non-emergent condition) '[]'  - 0 Routine Hospital Admission (non-emergent condition) '[]'  - 0 New Admissions / Biomedical engineer / Ordering NPWT, Apligraf, etc. '[]'  - 0 Emergency Hospital Admission (emergent condition) PROCESS - Special Needs '[]'  - Pediatric / Minor Patient Management 0 '[]'  - 0 Isolation Patient Management '[]'  - 0 Hearing / Language / Visual special needs '[]'  - 0 Assessment of Community assistance (transportation, D/C planning, etc.) '[]'  - 0 Additional assistance / Altered mentation '[]'  - 0 Support Surface(s) Assessment (bed, cushion, seat, etc.) INTERVENTIONS - Miscellaneous '[]'  - External ear exam 0 '[]'  - 0 Patient Transfer (multiple staff / Civil Service fast streamer / Similar devices) '[]'  - 0 Simple Staple / Suture removal (25 or less) '[]'  - 0 Complex Staple / Suture removal (26 or more) '[]'  - 0 Hypo/Hyperglycemic Management (do not check if billed separately) '[]'  - 0 Ankle / Brachial Index (ABI) - do not check if billed separately Has the patient been seen at the hospital within the last three years: Yes Total Score: 0 Level Of Care: ____ [name] ([id number]) Electronic  Signature(s) Signed: 01/26/2021 12:09:52 PM By: Carlene Coria RN Entered By: Carlene Coria on 01/25/2021 11:18:39 [name] ([id number]) -------------------------------------------------------------------------------- Compression  Therapy Details Patient Name: [name] Date of Service: 01/25/2021 10:15 AM Medical Record Number: [id number] Patient Account Number: [id number] Date of Birth/Sex: [birth year] (69 y.o. M) Treating RN: Carlene Coria Primary Care Sofija Antwi: Barney Drain Other Clinician: Jeanine Luz Referring Avarie Tavano: Barney Drain Treating Albertine Lafoy/Extender: Skipper Cliche in Treatment: 5 Compression Therapy Performed for Wound Assessment: Wound #1 Right,Circumferential Lower Leg Performed By: Clinician Carlene Coria, RN Compression Type: Three Layer Post Procedure Diagnosis Same as Pre-procedure Electronic Signature(s) Signed: 01/26/2021 12:09:52 PM By: Carlene Coria RN Entered By: Carlene Coria on 01/25/2021 10:59:37 [name] ([id number]) -------------------------------------------------------------------------------- Encounter Discharge Information Details Patient Name: [name] Date of Service: 01/25/2021 10:15 AM Medical Record Number: [id number] Patient Account Number: [id number] Date of Birth/Sex: 13-Nov-1951 (69 y.o. M) Treating RN: Carlene Coria Primary Care Burlin Mcnair: Barney Drain Other Clinician: Jeanine Luz Referring Ranjit Ashurst: Barney Drain Treating Tycen Dockter/Extender: Skipper Cliche in Treatment: 5 Encounter Discharge Information Items Discharge Condition: Stable Ambulatory Status: Ambulatory Discharge Destination: Home Transportation: Private Auto Accompanied By: self Schedule Follow-up Appointment: Yes Clinical Summary of Care: Patient Declined Electronic Signature(s) Signed: 01/26/2021 12:09:52 PM By: Carlene Coria RN Entered By: Carlene Coria on 01/25/2021 11:21:22 [name]  ([id number]) -------------------------------------------------------------------------------- Lower Extremity Assessment Details Patient Name: [name] Date of Service: 01/25/2021 10:15 AM Medical Record Number: [id number] Patient Account Number: [id number] Date of Birth/Sex: 10/25/1951 (69 y.o. M) Treating RN: Dolan Amen Primary Care Mayre Bury: Barney Drain Other Clinician: Jeanine Luz Referring Zephaniah Lubrano: Barney Drain Treating Elbridge Magowan/Extender: Skipper Cliche in Treatment: 5 Edema Assessment Assessed: [Left: No] [Right: No] [Left: Edema] [Right: :] Calf Left: Right: Point of Measurement: 27 cm From Medial Instep 36 cm Ankle Left: Right: Point of Measurement: 12 cm From Medial Instep 27 cm Vascular Assessment Pulses: Dorsalis Pedis Palpable: [Right:Yes] Electronic Signature(s) Signed: 01/26/2021 8:01:22 AM By: Jeanine Luz Signed: 01/26/2021 9:05:11 AM By: Georges Mouse, Minus Breeding RN Entered By: Jeanine Luz on 01/25/2021 10:41:21 [name] ([id number]) -------------------------------------------------------------------------------- Multi Wound Chart Details Patient Name: [name] Date of Service: 01/25/2021 10:15 AM Medical Record Number: [id number] Patient Account Number: [id number] Date of Birth/Sex: Mar 05, 1951 (69 y.o. M) Treating RN: Carlene Coria Primary Care Aryah Doering: Barney Drain Other Clinician: Jeanine Luz Referring Karris Deangelo: Barney Drain Treating Arshia Rondon/Extender: Skipper Cliche in Treatment: 5 Vital Signs Height(in): 67 Pulse(bpm): 68 Weight(lbs): 170 Blood Pressure(mmHg): 125/77 Body Mass Index(BMI): 27 Temperature(F): 97.8 Respiratory Rate(breaths/min): 18 Photos: [1:No Photos] [N/A:N/A] Wound Location: [1:Right, Circumferential Lower Leg] [N/A:N/A] Wounding Event: [1:Trauma] [N/A:N/A] Primary Etiology: [1:Trauma, Other] [N/A:N/A] Secondary Etiology: [1:Venous Leg Ulcer] [N/A:N/A] Comorbid  History: [1:Hypertension, History of pressure wounds, Neuropathy] [N/A:N/A] Date Acquired: [1:10/20/2020] [N/A:N/A] Weeks of Treatment: [1:5] [N/A:N/A] Wound Status: [1:Open] [N/A:N/A] Measurements L x W x D (cm) [1:8.5x22x0.2] [N/A:N/A] Area (cm) : [7:622.633] [N/A:N/A] Volume (cm) : [1:29.374] [N/A:N/A] % Reduction in Area: [1:-159.70%] [N/A:N/A] % Reduction in Volume: [1:-159.70%] [N/A:N/A] Classification: [1:Full Thickness Without Exposed Support Structures] [N/A:N/A] Exudate Amount: [1:Large] [N/A:N/A] Exudate Type: [1:Serous] [N/A:N/A] Exudate Color: [1:amber] [N/A:N/A] Wound Margin: [1:Indistinct, nonvisible] [N/A:N/A] Granulation Amount: [1:Medium (34-66%)] [N/A:N/A] Granulation Quality: [1:Pink, Hyper-granulation] [N/A:N/A] Necrotic Amount: [1:Medium (34-66%)] [N/A:N/A] Exposed Structures: [1:Fat Layer (Subcutaneous Tissue): Yes Fascia: No Tendon: No Muscle: No Joint: No Bone: No None] [N/A:N/A N/A] Treatment Notes Electronic Signature(s) Signed: 01/26/2021 12:09:52 PM By: Carlene Coria RN Entered By: Carlene Coria on 01/25/2021 10:57:33 [name] ([id number]) -------------------------------------------------------------------------------- [place] Details Patient Name: [name] Date of Service: 01/25/2021 10:15 AM Medical Record Number: [id number] Patient Account Number: [id number]  Date of Birth/Sex: 1951-05-23 (70 y.o. M) Treating RN: Carlene Coria Primary Care Jaaziel Peatross: Barney Drain Other Clinician: Jeanine Luz Referring Coburn Knaus: Barney Drain Treating Qusai Kem/Extender: Skipper Cliche in Treatment: 5 Active Inactive Wound/Skin Impairment Nursing Diagnoses: Impaired tissue integrity Knowledge deficit related to ulceration/compromised skin integrity Goals: Patient/caregiver will verbalize understanding of skin care regimen Date Initiated: 12/21/2020 Date Inactivated: 01/18/2021 Target Resolution Date: 01/18/2021 Goal  Status: Met Ulcer/skin breakdown will have a volume reduction of 30% by week 4 Date Initiated: 12/21/2020 Date Inactivated: 01/25/2021 Target Resolution Date: 01/18/2021 Goal Status: Met Ulcer/skin breakdown will have a volume reduction of 50% by week 8 Date Initiated: 12/21/2020 Target Resolution Date: 02/18/2021 Goal Status: Active Interventions: Assess patient/caregiver ability to obtain necessary supplies Assess patient/caregiver ability to perform ulcer/skin care regimen upon admission and as needed Assess ulceration(s) every visit Provide education on ulcer and skin care Treatment Activities: Referred to DME [facility] [place] for dressing supplies : 12/21/2020 Skin care regimen initiated : 12/21/2020 Topical wound management initiated : 12/21/2020 Notes: Electronic Signature(s) Signed: 01/26/2021 12:09:52 PM By: Carlene Coria RN Entered By: Carlene Coria on 01/25/2021 10:56:55 [name] ([id number]) -------------------------------------------------------------------------------- Pain Assessment Details Patient Name: [name] Date of Service: 01/25/2021 10:15 AM Medical Record Number: [id number] Patient Account Number: [id number] Date of Birth/Sex: 01-20-1951 (69 y.o. M) Treating RN: Dolan Amen Primary Care Rolinda Impson: Barney Drain Other Clinician: Jeanine Luz Referring Ruble Buttler: Barney Drain Treating Rhandi Despain/Extender: Skipper Cliche in Treatment: 5 Active Problems Location of Pain Severity and Description of Pain Patient Has Paino Yes Site Locations Rate the pain. Current Pain Level: 8 Character of Pain Describe the Pain: Aching, Burning Pain Management and Medication Current Pain Management: Electronic Signature(s) Signed: 01/26/2021 8:01:22 AM By: Jeanine Luz Signed: 01/26/2021 9:05:11 AM By: Georges Mouse, Minus Breeding RN Entered By: Jeanine Luz on 01/25/2021 10:29:04 [name]  ([id number]) -------------------------------------------------------------------------------- Patient/Caregiver Education Details Patient Name: [name] Date of Service: 01/25/2021 10:15 AM Medical Record Number: [id number] Patient Account Number: [id number] Date of Birth/Gender: August 04, 1951 (70 y.o. M) Treating RN: Carlene Coria Primary Care Physician: Barney Drain Other Clinician: Jeanine Luz Referring Physician: Barney Drain Treating Physician/Extender: Skipper Cliche in Treatment: 5 Education Assessment Education Provided To: Patient Education Topics Provided Wound/Skin Impairment: Methods: Explain/Verbal Responses: State content correctly Electronic Signature(s) Signed: 01/26/2021 12:09:52 PM By: Carlene Coria RN Entered By: Carlene Coria on 01/25/2021 11:20:18 [name] ([id number]) -------------------------------------------------------------------------------- Wound Assessment Details Patient Name: [name] Date of Service: 01/25/2021 10:15 AM Medical Record Number: [id number] Patient Account Number: [id number] Date of Birth/Sex: 12-31-1950 (69 y.o. M) Treating RN: Dolan Amen Primary Care Jacquelina Hewins: Barney Drain Other Clinician: Jeanine Luz Referring Aerianna Losey: Barney Drain Treating Asti Mackley/Extender: Skipper Cliche in Treatment: 5 Wound Status Wound Number: 1 Primary Etiology: Trauma, Other Wound Location: Right, Circumferential Lower Leg Secondary Venous Leg Ulcer Etiology: Wounding Event: Trauma Wound Status: Open Date Acquired: 10/20/2020 Comorbid History: Hypertension, History of pressure wounds, Weeks Of Treatment: 5 Neuropathy Clustered Wound: No Photos Photo Uploaded By: Jeanine Luz on 01/25/2021 11:58:00 Wound Measurements Length: (cm) 8.5 Width: (cm) 22 Depth: (cm) 0.2 Area: (cm) 146.869 Volume: (cm) 29.374 % Reduction in Area: -159.7% % Reduction in Volume: -159.7% Epithelialization:  None Tunneling: No Undermining: No Wound Description Classification: Full Thickness Without Exposed Support Structures Wound Margin: Indistinct, nonvisible Exudate Amount: Large Exudate Type: Serous Exudate Color: amber Foul Odor After Cleansing: No Slough/Fibrino Yes Wound Bed Granulation Amount: Medium (34-66%) Exposed Structure Granulation Quality: Pink, Hyper-granulation Fascia Exposed: No Necrotic Amount: Medium (  34-66%) Fat Layer (Subcutaneous Tissue) Exposed: Yes Necrotic Quality: Adherent Slough Tendon Exposed: No Muscle Exposed: No Joint Exposed: No Bone Exposed: No Treatment Notes Wound #1 (Lower Leg) Wound Laterality: Right, Circumferential Cleanser Normal Saline Discharge Instruction: Wash your hands with soap and water. Remove old dressing, discard into plastic bag and place into trash. Cleanse the wound with Normal Saline prior to applying a clean dressing using gauze sponges, not tissues or cotton balls. Do not ZUHAYR, DEENEY C. ([id number]) scrub or use excessive force. Pat dry using gauze sponges, not tissue or cotton balls. Soap and Water Discharge Instruction: Gently cleanse wound with antibacterial soap, rinse and pat dry prior to dressing wounds Peri-Wound Care Desitin Maximum Strength Ointment 4 (oz) Topical Primary Dressing Hydrofera Blue Ready Transfer Foam, 4x5 (in/in) Discharge Instruction: Apply Hydrofera Blue Ready to wound bed as directed Secondary Dressing Xtrasorb Large 6x9 (in/in) Discharge Instruction: Apply to wound as directed. Do not cut. Secured With Compression Wrap Profore Lite LF 3 Multilayer Compression Bandaging System Discharge Instruction: Apply 3 multi-layer wrap as prescribed. Compression Stockings Add-Ons Electronic Signature(s) Signed: 01/26/2021 8:01:22 AM By: Jeanine Luz Signed: 01/26/2021 9:05:11 AM By: Georges Mouse, Minus Breeding RN Entered By: Jeanine Luz on 01/25/2021 10:39:09 [name]  ([id number]) -------------------------------------------------------------------------------- Vitals Details Patient Name: [name] Date of Service: 01/25/2021 10:15 AM Medical Record Number: [id number] Patient Account Number: [id number] Date of Birth/Sex: 1950-12-20 (69 y.o. M) Treating RN: Dolan Amen Primary Care Anesha Hackert: Barney Drain Other Clinician: Jeanine Luz Referring Renner Sebald: Barney Drain Treating Zyona Pettaway/Extender: Skipper Cliche in Treatment: 5 Vital Signs Time Taken: 10:27 Temperature (F): 97.8 Height (in): 67 Pulse (bpm): 68 Weight (lbs): 170 Respiratory Rate (breaths/min): 18 Body Mass Index (BMI): 26.6 Blood Pressure (mmHg): 125/77 Reference Range: 80 - 120 mg / dl Electronic Signature(s) Signed: 01/26/2021 8:01:22 AM By: Jeanine Luz Entered By: Jeanine Luz on 01/25/2021 10:28:41

## 2021-01-28 ENCOUNTER — Other Ambulatory Visit: Payer: Self-pay

## 2021-01-28 DIAGNOSIS — S81801A Unspecified open wound, right lower leg, initial encounter: Secondary | ICD-10-CM | POA: Diagnosis not present

## 2021-02-01 ENCOUNTER — Other Ambulatory Visit: Payer: Self-pay

## 2021-02-01 ENCOUNTER — Encounter: Payer: Medicare (Managed Care) | Admitting: Physician Assistant

## 2021-02-01 DIAGNOSIS — S81801A Unspecified open wound, right lower leg, initial encounter: Secondary | ICD-10-CM | POA: Diagnosis not present

## 2021-02-01 NOTE — Progress Notes (Signed)
NATHANEAL, SOMMERS ([id number]) Visit Report for 02/01/2021 Arrival Information Details Patient Name: [name], [name] Date of Service: 02/01/2021 1:45 PM Medical Record Number: [id number] Patient Account Number: [id number] Date of Birth/Sex: 10/03/1951 (69 y.o. M) Treating RN: Dolan Amen Primary Care Telena Peyser: Barney Drain Other Clinician: Jeanine Luz Referring Dajanee Voorheis: Barney Drain Treating [place] Souter/Extender: Skipper Cliche in Treatment: 6 Visit Information History Since Last Visit Has Dressing in Place as Prescribed: Yes Patient Arrived: Cane Pain Present Now: Yes Arrival Time: 13:52 Accompanied By: Programmer, multimedia Assistance: None Patient Identification Verified: Yes Secondary Verification Process Completed: Yes Patient Requires Transmission-Based No Precautions: Patient Has Alerts: Yes Patient Alerts: NOT Diabetic ABI: R)0.91 L) 1.18 TBI R) 0.76 L) 0.73 12/02/2020 [facility] Electronic Signature(s) Signed: 02/01/2021 4:53:32 PM By: Georges Mouse, Minus Breeding RN Entered By: Georges Mouse, Minus Breeding on 02/01/2021 13:52:29 [name] ([id number]) -------------------------------------------------------------------------------- Clinic Level of Care Assessment Details Patient Name: [name] Date of Service: 02/01/2021 1:45 PM Medical Record Number: [id number] Patient Account Number: [id number] Date of Birth/Sex: [birth year] (69 y.o. M) Treating RN: Dolan Amen Primary Care Angely Dietz: Barney Drain Other Clinician: Jeanine Luz Referring Tylin Stradley: Barney Drain Treating Keionna Kinnaird/Extender: Skipper Cliche in Treatment: 6 Clinic Level of Care Assessment Items TOOL 1 Quantity Score '[]'  - Use when EandM and Procedure is performed on INITIAL visit 0 ASSESSMENTS - Nursing Assessment / Reassessment '[]'  - General Physical Exam (combine w/ comprehensive assessment (listed just below) when performed on new 0 pt. evals) '[]'  -  0 Comprehensive Assessment (HX, ROS, Risk Assessments, Wounds Hx, etc.) ASSESSMENTS - Wound and Skin Assessment / Reassessment '[]'  - Dermatologic / Skin Assessment (not related to wound area) 0 ASSESSMENTS - Ostomy and/or Continence Assessment and Care '[]'  - Incontinence Assessment and Management 0 '[]'  - 0 Ostomy Care Assessment and Management (repouching, etc.) PROCESS - Coordination of Care '[]'  - Simple Patient / Family Education for ongoing care 0 '[]'  - 0 Complex (extensive) Patient / Family Education for ongoing care '[]'  - 0 Staff obtains Programmer, systems, Records, Test Results / Process Orders '[]'  - 0 Staff telephones HHA, Nursing Homes / Clarify orders / etc '[]'  - 0 Routine Transfer to another Facility (non-emergent condition) '[]'  - 0 Routine Hospital Admission (non-emergent condition) '[]'  - 0 New Admissions / Biomedical engineer / Ordering NPWT, Apligraf, etc. '[]'  - 0 Emergency Hospital Admission (emergent condition) PROCESS - Special Needs '[]'  - Pediatric / Minor Patient Management 0 '[]'  - 0 Isolation Patient Management '[]'  - 0 Hearing / Language / Visual special needs '[]'  - 0 Assessment of Community assistance (transportation, D/C planning, etc.) '[]'  - 0 Additional assistance / Altered mentation '[]'  - 0 Support Surface(s) Assessment (bed, cushion, seat, etc.) INTERVENTIONS - Miscellaneous '[]'  - External ear exam 0 '[]'  - 0 Patient Transfer (multiple staff / Civil Service fast streamer / Similar devices) '[]'  - 0 Simple Staple / Suture removal (25 or less) '[]'  - 0 Complex Staple / Suture removal (26 or more) '[]'  - 0 Hypo/Hyperglycemic Management (do not check if billed separately) '[]'  - 0 Ankle / Brachial Index (ABI) - do not check if billed separately Has the patient been seen at the hospital within the last three years: Yes Total Score: 0 Level Of Care: ____ [name] ([id number]) Electronic Signature(s) Signed: 02/01/2021 4:53:32 PM By: Georges Mouse, Minus Breeding RN Entered By: Georges Mouse, Minus Breeding on 02/01/2021 14:37:54 [name] ([id number]) -------------------------------------------------------------------------------- Encounter Discharge Information Details Patient Name: [name] Date of Service: 02/01/2021 1:45 PM Medical Record Number:  [id number] Patient Account Number: [id number] Date of Birth/Sex: 1951/05/08 (69 y.o. M) Treating RN: Dolan Amen Primary Care Vashaun Osmon: Barney Drain Other Clinician: Jeanine Luz Referring Adaliz Dobis: Barney Drain Treating Shaynah Hund/Extender: Skipper Cliche in Treatment: 6 Encounter Discharge Information Items Post Procedure Vitals Discharge Condition: Stable Temperature (F): 98.3 Ambulatory Status: Cane Pulse (bpm): 85 Discharge Destination: Home Respiratory Rate (breaths/min): 18 Transportation: Private Auto Blood Pressure (mmHg): 134/78 Accompanied By: Education officer, museum Schedule Follow-up Appointment: Yes Clinical Summary of Care: Electronic Signature(s) Signed: 02/01/2021 4:53:32 PM By: Georges Mouse, Minus Breeding RN Entered By: Georges Mouse, Minus Breeding on 02/01/2021 14:39:42 [name] ([id number]) -------------------------------------------------------------------------------- Lower Extremity Assessment Details Patient Name: [name] Date of Service: 02/01/2021 1:45 PM Medical Record Number: [id number] Patient Account Number: [id number] Date of Birth/Sex: May 07, 1951 (69 y.o. M) Treating RN: Dolan Amen Primary Care Ameli Sangiovanni: Barney Drain Other Clinician: Jeanine Luz Referring Nicholus Chandran: Barney Drain Treating Meilin Brosh/Extender: Skipper Cliche in Treatment: 6 Edema Assessment Assessed: [Left: No] [Right: Yes] Edema: [Left: Ye] [Right: s] Calf Left: Right: Point of Measurement: 27 cm From Medial Instep 38 cm Ankle Left: Right: Point of Measurement: 12 cm From Medial Instep 28 cm Electronic Signature(s) Signed: 02/01/2021 4:53:32 PM By: Georges Mouse, Minus Breeding  RN Entered By: Georges Mouse, Minus Breeding on 02/01/2021 14:06:51 [name] ([id number]) -------------------------------------------------------------------------------- Multi Wound Chart Details Patient Name: [name] Date of Service: 02/01/2021 1:45 PM Medical Record Number: [id number] Patient Account Number: [id number] Date of Birth/Sex: 09/26/1951 (69 y.o. M) Treating RN: Dolan Amen Primary Care Jayzen Paver: Barney Drain Other Clinician: Jeanine Luz Referring Arlina Sabina: Barney Drain Treating Diyan Dave/Extender: Skipper Cliche in Treatment: 6 Vital Signs Height(in): 67 Pulse(bpm): 85 Weight(lbs): 170 Blood Pressure(mmHg): 134/78 Body Mass Index(BMI): 27 Temperature(F): 98.3 Respiratory Rate(breaths/min): 18 Photos: [N/A:N/A] Wound Location: Right, Circumferential Lower Leg N/A N/A Wounding Event: Trauma N/A N/A Primary Etiology: Trauma, Other N/A N/A Secondary Etiology: Venous Leg Ulcer N/A N/A Comorbid History: Hypertension, History of pressure N/A N/A wounds, Neuropathy Date Acquired: 10/20/2020 N/A N/A Weeks of Treatment: 6 N/A N/A Wound Status: Open N/A N/A Measurements L x W x D (cm) 10x14x0.2 N/A N/A Area (cm) : 109.956 N/A N/A Volume (cm) : 21.991 N/A N/A % Reduction in Area: -94.40% N/A N/A % Reduction in Volume: -94.40% N/A N/A Classification: Full Thickness Without Exposed N/A N/A Support Structures Exudate Amount: Large N/A N/A Exudate Type: Serosanguineous N/A N/A Exudate Color: red, brown N/A N/A Wound Margin: Indistinct, nonvisible N/A N/A Granulation Amount: Small (1-33%) N/A N/A Granulation Quality: Pink, Hyper-granulation N/A N/A Necrotic Amount: Large (67-100%) N/A N/A Exposed Structures: Fat Layer (Subcutaneous Tissue): N/A N/A Yes Fascia: No Tendon: No Muscle: No Joint: No Bone: No Epithelialization: None N/A N/A CUTLER, SUNDAY ([id number]) Treatment Notes Electronic Signature(s) Signed: 02/01/2021 4:53:32 PM  By: Georges Mouse, Minus Breeding RN Entered By: Georges Mouse, Minus Breeding on 02/01/2021 14:30:22 [name] ([id number]) -------------------------------------------------------------------------------- Multi-Disciplinary Care Plan Details Patient Name: [name] Date of Service: 02/01/2021 1:45 PM Medical Record Number: [id number] Patient Account Number: [id number] Date of Birth/Sex: 1951/05/06 (69 y.o. M) Treating RN: Dolan Amen Primary Care Patsye Sullivant: Barney Drain Other Clinician: Jeanine Luz Referring Afsheen Antony: Barney Drain Treating Anique Beckley/Extender: Skipper Cliche in Treatment: 6 Active Inactive Wound/Skin Impairment Nursing Diagnoses: Impaired tissue integrity Knowledge deficit related to ulceration/compromised skin integrity Goals: Patient/caregiver will verbalize understanding of skin care regimen Date Initiated: 12/21/2020 Date Inactivated: 01/18/2021 Target Resolution Date: 01/18/2021 Goal Status: Met Ulcer/skin breakdown will have a volume reduction of 30% by week 4 Date Initiated: 12/21/2020  Date Inactivated: 01/25/2021 Target Resolution Date: 01/18/2021 Goal Status: Met Ulcer/skin breakdown will have a volume reduction of 50% by week 8 Date Initiated: 12/21/2020 Target Resolution Date: 02/18/2021 Goal Status: Active Interventions: Assess patient/caregiver ability to obtain necessary supplies Assess patient/caregiver ability to perform ulcer/skin care regimen upon admission and as needed Assess ulceration(s) every visit Provide education on ulcer and skin care Treatment Activities: Referred to DME Jacson Rapaport for dressing supplies : 12/21/2020 Skin care regimen initiated : 12/21/2020 Topical wound management initiated : 12/21/2020 Notes: Electronic Signature(s) Signed: 02/01/2021 4:53:32 PM By: Georges Mouse, Minus Breeding RN Entered By: Georges Mouse, Minus Breeding on 02/01/2021 14:27:17 [name]  ([id number]) -------------------------------------------------------------------------------- Pain Assessment Details Patient Name: [name] Date of Service: 02/01/2021 1:45 PM Medical Record Number: [id number] Patient Account Number: [id number] Date of Birth/Sex: 01-27-1951 (69 y.o. M) Treating RN: Dolan Amen Primary Care Margi Edmundson: Barney Drain Other Clinician: Jeanine Luz Referring Carlia Bomkamp: Barney Drain Treating Antara Brecheisen/Extender: Skipper Cliche in Treatment: 6 Active Problems Location of Pain Severity and Description of Pain Patient Has Paino Yes Site Locations Pain Location: Pain in Ulcers Rate the pain. Current Pain Level: 8 Pain Management and Medication Current Pain Management: Electronic Signature(s) Signed: 02/01/2021 4:53:32 PM By: Georges Mouse, Minus Breeding RN Entered By: Georges Mouse, Kenia on 02/01/2021 13:53:33 [name] ([id number]) -------------------------------------------------------------------------------- Patient/Caregiver Education Details Patient Name: [name] Date of Service: 02/01/2021 1:45 PM Medical Record Number: [id number] Patient Account Number: [id number] Date of Birth/Gender: Mar 14, 1951 (69 y.o. M) Treating RN: Dolan Amen Primary Care Physician: Barney Drain Other Clinician: Jeanine Luz Referring Physician: Barney Drain Treating Physician/Extender: Skipper Cliche in Treatment: 6 Education Assessment Education Provided To: Patient and Caregiver social worker Education Topics Provided Notes educated on importance of compression socks for left leg Electronic Signature(s) Signed: 02/01/2021 4:53:32 PM By: Georges Mouse, Minus Breeding RN Entered By: Georges Mouse, Minus Breeding on 02/01/2021 14:38:47 [name] ([id number]) -------------------------------------------------------------------------------- Wound Assessment Details Patient Name: [name] Date of Service: 02/01/2021  1:45 PM Medical Record Number: [id number] Patient Account Number: [id number] Date of Birth/Sex: May 12, 1951 (69 y.o. M) Treating RN: Dolan Amen Primary Care Alajia Schmelzer: Barney Drain Other Clinician: Jeanine Luz Referring Ajeenah Heiny: Barney Drain Treating Tomica Arseneault/Extender: Skipper Cliche in Treatment: 6 Wound Status Wound Number: 1 Primary Etiology: Trauma, Other Wound Location: Right, Circumferential Lower Leg Secondary Venous Leg Ulcer Etiology: Wounding Event: Trauma Wound Status: Open Date Acquired: 10/20/2020 Comorbid History: Hypertension, History of pressure wounds, Weeks Of Treatment: 6 Neuropathy Clustered Wound: No Photos Wound Measurements Length: (cm) 10 Width: (cm) 14 Depth: (cm) 0.2 Area: (cm) 109.956 Volume: (cm) 21.991 % Reduction in Area: -94.4% % Reduction in Volume: -94.4% Epithelialization: None Tunneling: No Undermining: No Wound Description Classification: Full Thickness Without Exposed Support Structures Wound Margin: Indistinct, nonvisible Exudate Amount: Large Exudate Type: Serosanguineous Exudate Color: red, brown Foul Odor After Cleansing: No Slough/Fibrino Yes Wound Bed Granulation Amount: Small (1-33%) Exposed Structure Granulation Quality: Pink, Hyper-granulation Fascia Exposed: No Necrotic Amount: Large (67-100%) Fat Layer (Subcutaneous Tissue) Exposed: Yes Necrotic Quality: Adherent Slough Tendon Exposed: No Muscle Exposed: No Joint Exposed: No Bone Exposed: No Treatment Notes Wound #1 (Lower Leg) Wound Laterality: Right, Circumferential Cleanser Normal Saline Discharge Instruction: Wash your hands with soap and water. Remove old dressing, discard into plastic bag and place into trash. Cleanse the wound with Normal Saline prior to applying a clean dressing using gauze sponges, not tissues or cotton balls. Do not scrub or use excessive force. Pat dry using gauze sponges, not tissue or cotton  balls. CARLYN, MULLENBACH  ([id number]) Soap and Water Discharge Instruction: Gently cleanse wound with antibacterial soap, rinse and pat dry prior to dressing wounds Peri-Wound Care Desitin Maximum Strength Ointment 4 (oz) Topical Primary Dressing Hydrofera Blue Ready Transfer Foam, 4x5 (in/in) Discharge Instruction: Apply Hydrofera Blue Ready to wound bed as directed Secondary Dressing Xtrasorb Large 6x9 (in/in) Discharge Instruction: Apply to wound as directed. Do not cut. Secured With Compression Wrap Profore Lite LF 3 Multilayer Compression Bandaging System Discharge Instruction: Apply 3 multi-layer wrap as prescribed. Compression Stockings Add-Ons Electronic Signature(s) Signed: 02/01/2021 4:27:39 PM By: Carlene Coria RN Signed: 02/01/2021 4:53:32 PM By: Georges Mouse, Minus Breeding RN Entered By: Carlene Coria on 02/01/2021 14:10:46 [name] ([id number]) -------------------------------------------------------------------------------- Vitals Details Patient Name: [name] Date of Service: 02/01/2021 1:45 PM Medical Record Number: [id number] Patient Account Number: [id number] Date of Birth/Sex: 1951-03-14 (69 y.o. M) Treating RN: Dolan Amen Primary Care Wilsie Kern: Barney Drain Other Clinician: Jeanine Luz Referring Jalayiah Bibian: Barney Drain Treating Ruth Kovich/Extender: Skipper Cliche in Treatment: 6 Vital Signs Time Taken: 13:53 Temperature (F): 98.3 Height (in): 67 Pulse (bpm): 85 Weight (lbs): 170 Respiratory Rate (breaths/min): 18 Body Mass Index (BMI): 26.6 Blood Pressure (mmHg): 134/78 Reference Range: 80 - 120 mg / dl Electronic Signature(s) Signed: 02/01/2021 4:53:32 PM By: Georges Mouse, Minus Breeding RN Entered By: Georges Mouse, Minus Breeding on 02/01/2021 13:53:22

## 2021-02-01 NOTE — Progress Notes (Signed)
KEESHAWN, FAKHOURI ([id number]) Visit Report for 01/28/2021 Arrival Information Details Patient Name: [name], [name] Date of Service: 01/28/2021 12:30 PM Medical Record Number: [id number] Patient Account Number: [id number] Date of Birth/Sex: 11-Jun-1951 (69 y.o. M) Treating RN: Carlene Coria Primary Care Weylyn Ricciuti: Barney Drain Other Clinician: Jeanine Luz Referring [place] Nabor: Barney Drain Treating Mayley Lish/Extender: Skipper Cliche in Treatment: 5 Visit Information History Since Last Visit All ordered tests and consults were completed: No Patient Arrived: [name] Added or deleted any medications: No Arrival Time: 12:54 Any new allergies or adverse reactions: No Accompanied By: self Had a fall or experienced change in No Transfer Assistance: None activities of daily living that may affect Patient Identification Verified: Yes risk of falls: Secondary Verification Process Completed: Yes Signs or symptoms of abuse/neglect since last visito No Patient Requires Transmission-Based No Hospitalized since last visit: No Precautions: Has Dressing in Place as Prescribed: Yes Patient Has Alerts: Yes Has Compression in Place as Prescribed: Yes Patient Alerts: NOT Diabetic Pain Present Now: No ABI: R)0.91 L) 1.18 TBI R) 0.76 L) 0.73 12/02/2020 [facility] Electronic Signature(s) Signed: 02/01/2021 4:27:39 PM By: Carlene Coria RN Entered By: Carlene Coria on 01/28/2021 12:54:39 [name] ([id number]) -------------------------------------------------------------------------------- Clinic Level of Care Assessment Details Patient Name: [name] Date of Service: 01/28/2021 12:30 PM Medical Record Number: [id number] Patient Account Number: [id number] Date of Birth/Sex: Jan 30, 1951 (69 y.o. M) Treating RN: Carlene Coria Primary Care Rodderick Holtzer: Barney Drain Other Clinician: Jeanine Luz Referring Day Greb: Barney Drain Treating Laylaa Guevarra/Extender: Skipper Cliche  in Treatment: 5 Clinic Level of Care Assessment Items TOOL 1 Quantity Score []  - Use when EandM and Procedure is performed on INITIAL visit 0 ASSESSMENTS - Nursing Assessment / Reassessment []  - General Physical Exam (combine w/ comprehensive assessment (listed just below) when performed on new 0 pt. evals) []  - 0 Comprehensive Assessment (HX, ROS, Risk Assessments, Wounds Hx, etc.) ASSESSMENTS - Wound and Skin Assessment / Reassessment []  - Dermatologic / Skin Assessment (not related to wound area) 0 ASSESSMENTS - Ostomy and/or Continence Assessment and Care []  - Incontinence Assessment and Management 0 []  - 0 Ostomy Care Assessment and Management (repouching, etc.) PROCESS - Coordination of Care []  - Simple Patient / Family Education for ongoing care 0 []  - 0 Complex (extensive) Patient / Family Education for ongoing care []  - 0 Staff obtains Programmer, systems, Records, Test Results / Process Orders []  - 0 Staff telephones HHA, Nursing Homes / Clarify orders / etc []  - 0 Routine Transfer to another Facility (non-emergent condition) []  - 0 Routine Hospital Admission (non-emergent condition) []  - 0 New Admissions / Biomedical engineer / Ordering NPWT, Apligraf, etc. []  - 0 Emergency Hospital Admission (emergent condition) PROCESS - Special Needs []  - Pediatric / Minor Patient Management 0 []  - 0 Isolation Patient Management []  - 0 Hearing / Language / Visual special needs []  - 0 Assessment of Community assistance (transportation, D/C planning, etc.) []  - 0 Additional assistance / Altered mentation []  - 0 Support Surface(s) Assessment (bed, cushion, seat, etc.) INTERVENTIONS - Miscellaneous []  - External ear exam 0 []  - 0 Patient Transfer (multiple staff / Civil Service fast streamer / Similar devices) []  - 0 Simple Staple / Suture removal (25 or less) []  - 0 Complex Staple / Suture removal (26 or more) []  - 0 Hypo/Hyperglycemic Management (do not check if billed separately) []  -  0 Ankle / Brachial Index (ABI) - do not check if billed separately Has the patient been seen at the hospital within the  last three years: Yes Total Score: 0 Level Of Care: ____ [name] ([id number]) Electronic Signature(s) Signed: 02/01/2021 4:27:39 PM By: Carlene Coria RN Entered By: Carlene Coria on 01/28/2021 12:56:00 [name] ([id number]) -------------------------------------------------------------------------------- Compression Therapy Details Patient Name: [name] Date of Service: 01/28/2021 12:30 PM Medical Record Number: [id number] Patient Account Number: [id number] Date of Birth/Sex: 11-Jul-1951 (70 y.o. M) Treating RN: Carlene Coria Primary Care Katilyn Miltenberger: Barney Drain Other Clinician: Jeanine Luz Referring Kalayah Leske: Barney Drain Treating Gearald Stonebraker/Extender: Skipper Cliche in Treatment: 5 Compression Therapy Performed for Wound Assessment: Wound #1 Right,Circumferential Lower Leg Performed By: Clinician Carlene Coria, RN Compression Type: Three Layer Electronic Signature(s) Signed: 02/01/2021 4:27:39 PM By: Carlene Coria RN Entered By: Carlene Coria on 01/28/2021 12:55:22 [name] ([id number]) -------------------------------------------------------------------------------- Encounter Discharge Information Details Patient Name: [name] Date of Service: 01/28/2021 12:30 PM Medical Record Number: [id number] Patient Account Number: [id number] Date of Birth/Sex: 1951/08/07 (70 y.o. M) Treating RN: Carlene Coria Primary Care Madex Seals: Barney Drain Other Clinician: Jeanine Luz Referring Montey Ebel: Barney Drain Treating Jaylaa Gallion/Extender: Skipper Cliche in Treatment: 5 Encounter Discharge Information Items Discharge Condition: Stable Ambulatory Status: Cane Discharge Destination: Home Transportation: Private Auto Accompanied By: self Schedule Follow-up Appointment: Yes Clinical Summary of Care: Patient  Declined Electronic Signature(s) Signed: 02/01/2021 4:27:39 PM By: Carlene Coria RN Entered By: Carlene Coria on 01/28/2021 12:55:53 [name] ([id number]) -------------------------------------------------------------------------------- Wound Assessment Details Patient Name: [name] Date of Service: 01/28/2021 12:30 PM Medical Record Number: [id number] Patient Account Number: [id number] Date of Birth/Sex: 1951/08/03 (70 y.o. M) Treating RN: Carlene Coria Primary Care Brightyn Mozer: Barney Drain Other Clinician: Jeanine Luz Referring Tomma Ehinger: Barney Drain Treating Buel Molder/Extender: Skipper Cliche in Treatment: 5 Wound Status Wound Number: 1 Primary Etiology: Trauma, Other Wound Location: Right, Circumferential Lower Leg Secondary Venous Leg Ulcer Etiology: Wounding Event: Trauma Wound Status: Open Date Acquired: 10/20/2020 Comorbid History: Hypertension, History of pressure wounds, Weeks Of Treatment: 5 Neuropathy Clustered Wound: No Wound Measurements Length: (cm) 8.5 Width: (cm) 22 Depth: (cm) 0.2 Area: (cm) 146.869 Volume: (cm) 29.374 % Reduction in Area: -159.7% % Reduction in Volume: -159.7% Epithelialization: None Tunneling: No Undermining: No Wound Description Classification: Full Thickness Without Exposed Support Structures Wound Margin: Indistinct, nonvisible Exudate Amount: Large Exudate Type: Serous Exudate Color: amber Foul Odor After Cleansing: No Slough/Fibrino Yes Wound Bed Granulation Amount: Medium (34-66%) Exposed Structure Granulation Quality: Pink, Hyper-granulation Fascia Exposed: No Necrotic Amount: Medium (34-66%) Fat Layer (Subcutaneous Tissue) Exposed: Yes Necrotic Quality: Adherent Slough Tendon Exposed: No Muscle Exposed: No Joint Exposed: No Bone Exposed: No Electronic Signature(s) Signed: 02/01/2021 4:27:39 PM By: Carlene Coria RN Entered By: Carlene Coria on 01/28/2021 12:55:05

## 2021-02-01 NOTE — Progress Notes (Addendum)
[name], [name] ([id number]) Visit Report for 02/01/2021 Chief Complaint Document Details Patient Name: [name], [name] Date of Service: 02/01/2021 1:45 PM Medical Record Number: [id number] Patient Account Number: [id number] Date of Birth/Sex: 04/30/1951 (69 y.o. M) Treating RN: Dolan Amen Primary Care Provider: Barney Drain Other Clinician: Jeanine Luz Referring Provider: Barney Drain Treating Provider/Extender: Skipper Cliche in Treatment: 6 Information Obtained from: Patient Chief Complaint Right LE Ulcer Electronic Signature(s) Signed: 02/01/2021 2:24:44 PM By: Worthy Keeler PA-C Entered By: Worthy Keeler on 02/01/2021 14:24:44 [name] ([id number]) -------------------------------------------------------------------------------- Debridement Details Patient Name: [name] Date of Service: 02/01/2021 1:45 PM Medical Record Number: [id number] Patient Account Number: [id number] Date of Birth/Sex: [birth year] (69 y.o. M) Treating RN: Dolan Amen Primary Care Provider: Barney Drain Other Clinician: Jeanine Luz Referring Provider: Barney Drain Treating Provider/Extender: Skipper Cliche in Treatment: 6 Debridement Performed for Wound #1 Right,Circumferential Lower Leg Assessment: Performed By: Physician Tommie Sams., PA-C Debridement Type: Debridement Severity of Tissue Pre Debridement: Fat layer exposed Level of Consciousness (Pre- Awake and Alert procedure): Pre-procedure Verification/Time Out Yes - 14:30 Taken: Start Time: 14:30 Total Area Debrided (L x W): 8.7 (cm) x 10 (cm) = 87 (cm) Tissue and other material Viable, Non-Viable, Slough, Subcutaneous, Slough debrided: Level: Skin/Subcutaneous Tissue Debridement Description: Excisional Instrument: Curette Bleeding: Minimum Hemostasis Achieved: Pressure Response to Treatment: Procedure was tolerated well Level of Consciousness (Post- Awake and  Alert procedure): Post Debridement Measurements of Total Wound Length: (cm) 10 Width: (cm) 14 Depth: (cm) 0.3 Volume: (cm) 32.987 Character of Wound/Ulcer Post Debridement: Stable Severity of Tissue Post Debridement: Fat layer exposed Post Procedure Diagnosis Same as Pre-procedure Electronic Signature(s) Signed: 02/01/2021 4:39:22 PM By: Worthy Keeler PA-C Signed: 02/01/2021 4:53:32 PM By: Georges Mouse, Minus Breeding RN Entered By: Georges Mouse, Minus Breeding on 02/01/2021 14:36:08 [name] ([id number]) -------------------------------------------------------------------------------- HPI Details Patient Name: [name] Date of Service: 02/01/2021 1:45 PM Medical Record Number: [id number] Patient Account Number: [id number] Date of Birth/Sex: [birth year] (69 y.o. M) Treating RN: Dolan Amen Primary Care Provider: Barney Drain Other Clinician: Jeanine Luz Referring Provider: Barney Drain Treating Provider/Extender: Skipper Cliche in Treatment: 6 History of Present Illness HPI Description: 12/21/2020 upon evaluation today patient appears to be doing somewhat poorly in regard to his right lower extremity. He does have some erythema and is also having issues with quite a bit of drainage currently from a wound that he tells me has been present since about 2 months ago when he initially had an injury that led to this wound beginning. He does have chronic venous stasis and is secondary to this I think it has been more difficult to heal this wound than normal. With that being said the patient tells me that this tends to drain a lot down onto his feet. He is also had some issues with fungal infection he is currently on miconazole powder for that. Fortunately there is no signs of active infection systemically at this time. No fevers, chills, nausea, vomiting, or diarrhea. He does have pain but again this is more to the posterior aspect not really more lateral or anterior.  There is evidence that he may have some local infection due to erythema and warmth surrounding the wound post debridement I do want to obtain a wound culture to evaluate and see if there is anything indeed of concern here. He did have arterial studies which revealed that he had a ABI of 0.91 on the right with a TBI of 0.76  in the left his ABI was 1.18 with a TBI of 0.73. He has good blood flow here. In regard to his x-ray that was taken on 11/30/2020 there was no evidence of osseous abnormality. The arterial study was actually performed on 12/03/2020. The patient was hospitalized from 11/30/2020 through 12/02/2020 during that time he was on IV antibiotics including vancomycin and cefepime. 12/28/2020 upon evaluation today patient appears to be doing somewhat poorly in regard to his legs currently is having a lot of drainage unfortunately which again he was not actually being wrapped with a 3 layer compression wrap after this was changed at pace. Subsequently I think that the patient does need to have ongoing compression therapy. Right now that means that were doing the compression wrap later he will need compression stockings. Either way though I think that he cannot just have a border foam dressing on this which was what was on today that is not sufficient to catch any of the drainage and to be honest I think this can lead to infection and greater issues here for him. The wound is pretty much about circumferential at this point a weeping area included. 01/04/2021 upon evaluation today patient unfortunately is doing quite a bit worse in regard to the overall appearance of his wound from the standpoint of infection. I did see evidence of [name] green drainage consistent with Pseudomonas which I discussed with the patient today as well as his representative from social services. Nonetheless I believe that we likely need to treat him for Pseudomonas right now the clindamycin he is on is not covering for this.  With that being said no sharp debridement was performed today. Of note the patient's Hydrofera Blue dressing was not the ready transfer version and unfortunately did cause this did not drain quite as well as we would like to. Nonetheless I do feel like that the patient would benefit from continuing with the wraps changed out their pace I think they can definitely do a good job and his swelling is under good control the big thing here is that we need to make sure that the dressing is applied appropriately we will contact them just to confirm and make sure that they know the appropriate way to apply the [facility] that they have. 01/11/2021 upon evaluation today patient appears to be doing somewhat better in regard to the erythema on his leg at this point. Fortunately there are no signs of active infection at this time. No fevers, chills, nausea, vomiting, or diarrhea. With that being said he has been tolerating the dressing changes without complication and very pleased in that regard. I do think that he seems to be making good progress in general which is good news. I think that the biggest issue I see right now is simply the [facility] that is being used cannot transfer through to the [facility] and therefore extracting a lot more fluid against the skin which is causing some irritation as well. I think that we may be able to wrap him here and get things in a better position for him to be honest. 01/18/2021 upon evaluation today patient appears to be doing better in regard to his leg. I feel like he is definitely making improvement and overall I think that this is significantly improved compared to the previous evaluation. With that being said there is still quite a ways to go to get this to heal and it is going to take quite a bit of time. Fortunately there is  no evidence of active infection at this time. No fevers, chills, nausea, vomiting, or diarrhea. 01/25/2021 upon evaluation today patient  appears to be doing well with regard to his leg. I feel like he is definitely showing signs of improvement which is great news. There is no evidence of active infection at this time. No fevers, chills, nausea, vomiting, or diarrhea. 02/01/2021 upon evaluation today patient appears to be doing decently well in regard to his leg on the right as compared to previously noted. There does not appear to be any signs of active infection at this time which is great news and overall I am pleased that his systemically. Nonetheless locally still show signs of erythema he still has several days left of his antibiotic as apparently this was not started immediately after I wrote that for him last time. Nonetheless I think that he is taking the Levaquin now and hopefully its doing job to help clear things up. Nonetheless if he is not significantly better by next week I would likely repeat the culture to try to see if anything is changed as again he still has some erythema this week that has me concerned at this point. Electronic Signature(s) Signed: 02/01/2021 2:45:45 PM By: Worthy Keeler PA-C Entered By: Worthy Keeler on 02/01/2021 14:45:44 [name] ([id number]) -------------------------------------------------------------------------------- Physical Exam Details Patient Name: [name] Date of Service: 02/01/2021 1:45 PM Medical Record Number: [id number] Patient Account Number: [id number] Date of Birth/Sex: 05-03-1951 (69 y.o. M) Treating RN: Dolan Amen Primary Care Provider: Barney Drain Other Clinician: Jeanine Luz Referring Provider: Barney Drain Treating Provider/Extender: Skipper Cliche in Treatment: 6 Constitutional Well-nourished and well-hydrated in no acute distress. Respiratory normal breathing without difficulty. Psychiatric this patient is able to make decisions and demonstrates good insight into disease process. Alert and Oriented x 3. pleasant and  cooperative. Notes Upon inspection patient's wound bed actually showed signs of slough buildup on the surface of the wound and I did perform sharp debridement to clear some of this way today he tolerated that without complication post debridement the wound bed appears to be doing better. There still is some need for improvement and I think possibly changing this more frequently could be beneficial for him. Electronic Signature(s) Signed: 02/01/2021 2:46:07 PM By: Worthy Keeler PA-C Entered By: Worthy Keeler on 02/01/2021 14:46:07 [name] ([id number]) -------------------------------------------------------------------------------- Physician Orders Details Patient Name: [name] Date of Service: 02/01/2021 1:45 PM Medical Record Number: [id number] Patient Account Number: [id number] Date of Birth/Sex: [birth year] (69 y.o. 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 o Nurse Visit as needed - [place] twice a week Bathing/ Shower/ Hygiene o May shower with wound dressing protected with water repellent cover or cast protector. Edema Control - Lymphedema / Segmental Compressive Device / Other Right Lower Extremity o Optional: One layer of unna paste to top of compression wrap (to act as an anchor). o 3 Layer Compression System for Lymphedema. Wound Treatment Wound #1 - Lower Leg Wound  Laterality: Right, Circumferential Cleanser: Normal Saline  3 x Per Week/30 Days Discharge Instructions: Wash your hands with soap and water. Remove old dressing, discard into plastic bag and place into trash. Cleanse the wound with Normal Saline prior to applying a clean dressing using gauze sponges, not tissues or cotton balls. Do not scrub or use excessive force. Pat dry using gauze sponges, not tissue or cotton balls. Cleanser: Soap and Water 3 x Per Week/30 Days Discharge Instructions: Gently cleanse wound with antibacterial soap, rinse and pat dry prior to dressing wounds Peri-Wound Care: Desitin Maximum Strength Ointment 4 (oz) 3 x Per Week/30 Days Primary Dressing: Hydrofera Blue Ready Transfer Foam, 4x5 (in/in) 3 x Per Week/30 Days Discharge Instructions: Apply Hydrofera Blue Ready to wound bed as directed Secondary Dressing: Xtrasorb Large 6x9 (in/in) 3 x Per Week/30 Days Discharge Instructions: Apply to wound as directed. Do not cut. Compression Wrap: Profore Lite LF 3 Multilayer Compression Bandaging System 3 x Per Week/30 Days Discharge Instructions: Apply 3 multi-layer wrap as prescribed. Electronic Signature(s) Signed: 02/01/2021 4:39:22 PM By: Worthy Keeler PA-C Signed: 02/01/2021 4:53:32 PM By: Georges Mouse, Minus Breeding RN Entered By: Georges Mouse, Minus Breeding on 02/01/2021 14:40:32 [name] ([id number]) -------------------------------------------------------------------------------- Problem List Details Patient Name: [name] Date of Service: 02/01/2021 1:45 PM Medical Record Number: [id number] Patient Account Number: [id number] Date of Birth/Sex: August 02, 1951 (69 y.o. 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 M) Treating RN: Dolan Amen Primary Care Provider: Barney Drain Other Clinician: Jeanine Luz Referring Provider: Barney Drain Treating Provider/Extender: Skipper Cliche in Treatment: 6 Subjective Chief Complaint Information obtained from Patient Right LE Ulcer History of Present Illness (HPI) 12/21/2020 upon evaluation today patient appears to be doing somewhat poorly in regard to his right lower extremity. He does have some erythema and is also having issues with quite a bit of drainage currently from a wound that he tells me has been present since about 2 months ago when he initially had an injury that led to this wound beginning. He does have chronic venous stasis and is secondary to this I think it has been more difficult to heal this wound than normal. With that being said the patient tells me that this tends to drain a lot down onto his feet. He is also had some issues with fungal infection he is currently on miconazole powder for that. Fortunately there is no signs of active infection systemically at this time. No fevers, chills,  nausea, vomiting, or diarrhea. He does have pain but again this is more to the posterior aspect not really more lateral or anterior. There is evidence that he may have some local infection due to erythema and warmth surrounding the wound post debridement  I do want to obtain a wound culture to evaluate and see if there is anything indeed of concern here. He did have arterial studies which revealed that he had a ABI of 0.91 on the right with a TBI of 0.76 in the left his ABI was 1.18 with a TBI of 0.73. He has good blood flow here. In regard to his x-ray that was taken on 11/30/2020 there was no evidence of osseous abnormality. The arterial study was actually performed on 12/03/2020. The patient was hospitalized from 11/30/2020 through 12/02/2020 during that time he was on IV antibiotics including vancomycin and cefepime. 12/28/2020 upon evaluation today patient appears to be doing somewhat poorly in regard to his legs currently is having a lot of drainage unfortunately which again he was not actually being wrapped with a 3 layer compression wrap after this was changed at pace. Subsequently I think that the patient does need to have ongoing compression therapy. Right now that means that were doing the compression wrap later he will need compression stockings. Either way though I think that he cannot just have a border foam dressing on this which was what was on today that is not sufficient to catch any of the drainage and to be honest I think this can lead to infection and greater issues here for him. The wound is pretty much about circumferential at this point a weeping area included. 01/04/2021 upon evaluation today patient unfortunately is doing quite a bit worse in regard to the overall appearance of his wound from the standpoint of infection. I did see evidence of [name] green drainage consistent with Pseudomonas which I discussed with the patient today as well as his representative from social services.  Nonetheless I believe that we likely need to treat him for Pseudomonas right now the clindamycin he is on is not covering for this. With that being said no sharp debridement was performed today. Of note the patient's Hydrofera Blue dressing was not the ready transfer version and unfortunately did cause this did not drain quite as well as we would like to. Nonetheless I do feel like that the patient would benefit from continuing with the wraps changed out their pace I think they can definitely do a good job and his swelling is under good control the big thing here is that we need to make sure that the dressing is applied appropriately we will contact them just to confirm and make sure that they know the appropriate way to apply the [facility] that they have. 01/11/2021 upon evaluation today patient appears to be doing somewhat better in regard to the erythema on his leg at this point. Fortunately there are no signs of active infection at this time. No fevers, chills, nausea, vomiting, or diarrhea. With that being said he has been tolerating the dressing changes without complication and very pleased in that regard. I do think that he seems to be making good progress in general which is good news. I think that the biggest issue I see right now is simply the [facility] that is being used cannot transfer through to the [place] and therefore extracting a lot more fluid against the skin which is causing some irritation as well. I think that we may be able to wrap him here and get things in a better position for him to be honest. 01/18/2021 upon evaluation today patient appears to be doing better in regard to his leg. I feel like he is definitely making improvement and overall I  think that this is significantly improved compared to the previous evaluation. With that being said there is still quite a ways to go to get this to heal and it is going to take quite a bit of time. Fortunately there is no  evidence of active infection at this time. No fevers, chills, nausea, vomiting, or diarrhea. 01/25/2021 upon evaluation today patient appears to be doing well with regard to his leg. I feel like he is definitely showing signs of improvement which is great news. There is no evidence of active infection at this time. No fevers, chills, nausea, vomiting, or diarrhea. 02/01/2021 upon evaluation today patient appears to be doing decently well in regard to his leg on the right as compared to previously noted. There does not appear to be any signs of active infection at this time which is great news and overall I am pleased that his systemically. Nonetheless locally still show signs of erythema he still has several days left of his antibiotic as apparently this was not started immediately after I wrote that for him last time. Nonetheless I think that he is taking the Levaquin now and hopefully its doing job to help clear things up. Nonetheless if he is not significantly better by next week I would likely repeat the culture to try to see if anything is changed as again he still has some erythema this week that has me concerned at this point. [name], [name] ([id number]) Objective Constitutional Well-nourished and well-hydrated in no acute distress. Vitals Time Taken: 1:53 PM, Height: 67 in, Weight: 170 lbs, BMI: 26.6, Temperature: 98.3 F, Pulse: 85 bpm, Respiratory Rate: 18 breaths/min, Blood Pressure: 134/78 mmHg. Respiratory normal breathing without difficulty. Psychiatric this patient is able to make decisions and demonstrates good insight into disease process. Alert and Oriented x 3. pleasant and cooperative. General Notes: Upon inspection patient's wound bed actually showed signs of slough buildup on the surface of the wound and I did perform sharp debridement to clear some of this way today he tolerated that without complication post debridement the wound bed appears to be doing better. There  still is some need for improvement and I think possibly changing this more frequently could be beneficial for him. Integumentary (Hair, Skin) Wound #1 status is Open. Original cause of wound was Trauma. The date acquired was: 10/20/2020. The wound has been in treatment 6 weeks. The wound is located on the Right,Circumferential Lower Leg. The wound measures 10cm length x 14cm width x 0.2cm depth; 109.956cm^2 area and 21.991cm^3 volume. There is Fat Layer (Subcutaneous Tissue) exposed. There is no tunneling or undermining noted. There is a large amount of serosanguineous drainage noted. The wound margin is indistinct and nonvisible. There is small (1-33%) pink, hyper - granulation within the wound bed. There is a large (67-100%) amount of necrotic tissue within the wound bed including Adherent Slough. 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 There was a Excisional Skin/Subcutaneous Tissue Debridement with a total area of 87 sq cm performed by Tommie Sams., PA-C. With the following instrument(s): Curette to remove Viable and Non-Viable tissue/material. Material removed includes Subcutaneous Tissue and Slough and. A time out  was conducted at 14:30, prior to the start of the procedure. A Minimum amount of bleeding was controlled with Pressure. The procedure was tolerated well. Post Debridement Measurements: 10cm length x 14cm width x 0.3cm depth; 32.987cm^3 volume. Character of Wound/Ulcer Post Debridement is stable. Severity of Tissue Post Debridement is: Fat layer  exposed. Post procedure Diagnosis Wound #1: Same as Pre-Procedure Plan Follow-up Appointments: Return Appointment in 1 week. Nurse Visit as needed - [place] twice a week Bathing/ Shower/ Hygiene: May shower with wound dressing protected with water repellent cover or cast protector. Edema Control - Lymphedema / Segmental Compressive Device / Other: Optional: One layer of unna paste to top of compression wrap (to act as an anchor). 3 Layer Compression System for Lymphedema. WOUND #1: - Lower Leg Wound Laterality: Right, Circumferential Cleanser: Normal Saline 3 x Per Week/30 Days [name], [name] ([id number]) Discharge Instructions: Wash your hands with soap and water. Remove old dressing, discard into plastic bag and place into trash. Cleanse the wound with Normal Saline prior to applying a clean dressing using gauze sponges, not tissues or cotton balls. Do not scrub or use excessive force. Pat dry using gauze sponges, not tissue or cotton balls. Cleanser: Soap and Water 3 x Per Week/30 Days Discharge Instructions: Gently cleanse wound with antibacterial soap, rinse and pat dry prior to dressing wounds Peri-Wound Care: Desitin Maximum Strength Ointment 4 (oz) 3 x Per Week/30 Days Primary Dressing: Hydrofera Blue Ready Transfer Foam, 4x5 (in/in) 3 x Per Week/30 Days Discharge Instructions: Apply Hydrofera Blue Ready to wound bed as directed Secondary Dressing: Xtrasorb Large 6x9 (in/in) 3 x Per Week/30 Days Discharge Instructions: Apply to wound as directed. Do not cut. Compression Wrap: Profore Lite LF 3 Multilayer Compression Bandaging System 3 x Per Week/30 Days Discharge Instructions: Apply 3 multi-layer wrap as prescribed. 1. Would recommend currently that we continue with wound care measures as before and the patient is in agreement the plan that includes the use of silver collagen which I think is doing a good job. 2. I am also can recommend that we continue with the XtraSorb to cover  and we are using zinc overall the good skin to protect this. Were also going to continue with a 3 layer compression wrap. 3. I do believe that the patient really needs to be seen more frequently. I think that just even twice a week dressing changes he has not enough here. I think he needs to probably have this changed 3 times a week and for that reason we will get him moved things around a little bit to plan to see him Monday, Wednesday, and Friday. I think that by doing so will be able to control the drainage much better and keep things moving in the correct direction here. Also of note if he is not doing significantly better with regard to the erythema come next week I would likely get repeat a culture at that point in order to try to help with getting this better moving forward. The patient is in agreement with that plan as well. We will see patient back for reevaluation in 1 week here in the clinic. If anything worsens or changes patient will contact our office for additional recommendations. Electronic Signature(s) Signed: 02/01/2021 2:46:35 PM By: Worthy Keeler PA-C Entered By: Worthy Keeler on 02/01/2021 14:46:34 [name] ([id number]) -------------------------------------------------------------------------------- SuperBill Details Patient Name: [name] Date of Service: 02/01/2021 Medical Record Number: [id number] Patient Account Number: [id number] Date of Birth/Sex: 1951/03/03 (  70 y.o. 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 Facility Procedures CPT4 Code: 00459977 Description: [place] - DEB SUBQ TISSUE 20 SQ CM/< Modifier: Quantity: 1 CPT4 Code: Description: ICD-10 Diagnosis Description S81.801A Unspecified open wound, right lower leg, initial encounter Modifier: Quantity: CPT4 Code: 41423953 Description: 20233 - DEB SUBQ TISS EA ADDL 20CM Modifier: Quantity: 4 CPT4 Code: Description: ICD-10 Diagnosis Description S81.801A Unspecified open wound, right lower leg, initial encounter Modifier: Quantity: Physician Procedures CPT4 Code: 4356861 Description: 11042 - WC PHYS SUBQ TISS 20 SQ CM Modifier: Quantity: 1 CPT4 Code: Description: ICD-10 Diagnosis Description S81.801A Unspecified open wound, right lower leg, initial encounter Modifier: Quantity: CPT4 Code: 6837290 Description: 11045 - WC PHYS SUBQ TISS EA ADDL 20 CM Modifier: Quantity: 4 CPT4 Code: Description: ICD-10 Diagnosis Description S81.801A Unspecified open wound, right lower leg, initial encounter Modifier: Quantity: Electronic Signature(s) Signed: 02/01/2021 2:46:42 PM By: Worthy Keeler PA-C Entered By: Worthy Keeler on 02/01/2021 14:46:42

## 2021-02-04 ENCOUNTER — Other Ambulatory Visit: Payer: Self-pay

## 2021-02-04 DIAGNOSIS — S81801A Unspecified open wound, right lower leg, initial encounter: Secondary | ICD-10-CM | POA: Diagnosis not present

## 2021-02-04 NOTE — Progress Notes (Signed)
IGNATZ, DEIS ([id number]) Visit Report for 02/04/2021 Arrival Information Details Patient Name: [name], [name] Date of Service: 02/04/2021 12:45 PM Medical Record Number: [id number] Patient Account Number: [id number] Date of Birth/Sex: September 26, 1951 (69 y.o. M) Treating RN: Cornell Barman Primary Care Margrett Kalb: Barney Drain Other Clinician: Jeanine Luz Referring Nikolas Casher: Barney Drain Treating Lanise Mergen/Extender: Skipper Cliche in Treatment: 6 Visit Information History Since Last Visit Has Dressing in Place as Prescribed: Yes Patient Arrived: Cane Has Compression in Place as Prescribed: Yes Arrival Time: 13:15 Pain Present Now: No Accompanied By: self Transfer Assistance: None Patient Identification Verified: Yes Secondary Verification Process Completed: Yes Patient Requires Transmission-Based No Precautions: Patient Has Alerts: Yes Patient Alerts: NOT Diabetic ABI: R)0.91 L) 1.18 TBI R) 0.76 L) 0.73 12/02/2020 [facility] Electronic Signature(s) Signed: 02/04/2021 5:25:36 PM By: Gretta Cool, BSN, RN, CWS, Kim RN, BSN Entered By: Gretta Cool, BSN, RN, CWS, Kim on 02/04/2021 17:25:36 [name] ([id number]) -------------------------------------------------------------------------------- Compression Therapy Details Patient Name: [name] Date of Service: 02/04/2021 12:45 PM Medical Record Number: [id number] Patient Account Number: [id number] Date of Birth/Sex: 15-May-1951 (69 y.o. M) Treating RN: Cornell Barman Primary Care Sharona Rovner: Barney Drain Other Clinician: Jeanine Luz Referring Kmarion Rawl: Barney Drain Treating Richie Bonanno/Extender: Skipper Cliche in Treatment: 6 Compression Therapy Performed for Wound Assessment: Wound #1 Right,Circumferential Lower Leg Performed By: Clinician Cornell Barman, RN Compression Type: Three Layer Pre Treatment ABI: 0.9 Electronic Signature(s) Signed: 02/04/2021 5:26:24 PM By: Gretta Cool, BSN, RN, CWS, Kim RN, BSN Entered By:  Gretta Cool, BSN, RN, CWS, Kim on 02/04/2021 17:26:24 [name] ([id number]) -------------------------------------------------------------------------------- Encounter Discharge Information Details Patient Name: [name] Date of Service: 02/04/2021 12:45 PM Medical Record Number: [id number] Patient Account Number: [id number] Date of Birth/Sex: Jul 05, 1951 (69 y.o. M) Treating RN: Cornell Barman Primary Care Annaclaire Walsworth: Barney Drain Other Clinician: Jeanine Luz Referring Radames Mejorado: Barney Drain Treating Byrant Valent/Extender: Skipper Cliche in Treatment: 6 Encounter Discharge Information Items Discharge Condition: Stable Ambulatory Status: [place] Discharge Destination: Home Transportation: Private Auto Schedule Follow-up Appointment: Yes Clinical Summary of Care: Electronic Signature(s) Signed: 02/04/2021 5:26:55 PM By: Gretta Cool, BSN, RN, CWS, Kim RN, BSN Entered By: Gretta Cool, BSN, RN, CWS, Kim on 02/04/2021 17:26:55 [name] ([id number]) -------------------------------------------------------------------------------- Wound Assessment Details Patient Name: [name] Date of Service: 02/04/2021 12:45 PM Medical Record Number: [id number] Patient Account Number: [id number] Date of Birth/Sex: [birth year] (69 y.o. M) Treating RN: Cornell Barman Primary Care Brookie Wayment: Barney Drain Other Clinician: Jeanine Luz Referring Ranyah Groeneveld: Barney Drain Treating Kyann Heydt/Extender: Skipper Cliche in Treatment: 6 Wound Status Wound Number: 1 Primary Etiology: Trauma, Other Wound Location: Right, Circumferential Lower Leg Secondary Venous Leg Ulcer Etiology: Wounding Event: Trauma Wound Status: Open Date Acquired: 10/20/2020 Comorbid History: Hypertension, History of pressure wounds, Weeks Of Treatment: 6 Neuropathy Clustered Wound: No Wound Measurements Length: (cm) 10 Width: (cm) 14 Depth: (cm) 0.2 Area: (cm) 109.956 Volume: (cm) 21.991 % Reduction in Area:  -94.4% % Reduction in Volume: -94.4% Epithelialization: None Wound Description Classification: Full Thickness Without Exposed Support Structures Wound Margin: Indistinct, nonvisible Exudate Amount: Large Exudate Type: Serosanguineous Exudate Color: red, brown Foul Odor After Cleansing: No Slough/Fibrino Yes Wound Bed Granulation Amount: Small (1-33%) Exposed Structure Granulation Quality: Pink, Hyper-granulation Fascia Exposed: No Necrotic Amount: Large (67-100%) Fat Layer (Subcutaneous Tissue) Exposed: Yes Necrotic Quality: Adherent Slough Tendon Exposed: No Muscle Exposed: No Joint Exposed: No Bone Exposed: No Treatment Notes Wound #1 (Lower Leg) Wound Laterality: Right, Circumferential Cleanser Normal Saline Discharge Instruction: Wash your hands with soap and water. Remove  old dressing, discard into plastic bag and place into trash. Cleanse the wound with Normal Saline prior to applying a clean dressing using gauze sponges, not tissues or cotton balls. Do not scrub or use excessive force. Pat dry using gauze sponges, not tissue or cotton balls. Soap and Water Discharge Instruction: Gently cleanse wound with antibacterial soap, rinse and pat dry prior to dressing wounds Peri-Wound Care Desitin Maximum Strength Ointment 4 (oz) Topical Primary Dressing Hydrofera Blue Ready Transfer Foam, 4x5 (in/in) Discharge Instruction: Apply Hydrofera Blue Ready to wound bed as directed Secondary Dressing Xtrasorb Large 6x9 (in/in) Discharge Instruction: Apply to wound as directed. Do not cut. [name], [name] ([id number]) Secured With Compression Wrap Profore Lite LF 3 Multilayer Compression Bandaging System Discharge Instruction: Apply 3 multi-layer wrap as prescribed. Compression Stockings Environmental education officer) Signed: 02/04/2021 5:25:54 PM By: Gretta Cool, BSN, RN, CWS, Kim RN, BSN Entered By: Gretta Cool, BSN, RN, CWS, Kim on 02/04/2021 17:25:53

## 2021-02-07 ENCOUNTER — Inpatient Hospital Stay (HOSPITAL_COMMUNITY)
Admission: EM | Admit: 2021-02-07 | Discharge: 2021-02-11 | DRG: 603 | Disposition: A | Discharge status: Skilled Nursing Facility | Payer: Medicare (Managed Care) | Source: Ambulatory Visit | Attending: Internal Medicine | Admitting: Internal Medicine

## 2021-02-07 ENCOUNTER — Other Ambulatory Visit
Admission: RE | Admit: 2021-02-07 | Discharge: 2021-02-07 | Disposition: A | Discharge status: Home / Self Care | Payer: Medicare (Managed Care) | Source: Ambulatory Visit | Attending: Physician Assistant | Admitting: Physician Assistant

## 2021-02-07 ENCOUNTER — Other Ambulatory Visit: Payer: Self-pay

## 2021-02-07 ENCOUNTER — Encounter (HOSPITAL_COMMUNITY): Payer: Self-pay | Admitting: Emergency Medicine

## 2021-02-07 ENCOUNTER — Encounter: Payer: Medicare (Managed Care) | Admitting: Physician Assistant

## 2021-02-07 DIAGNOSIS — J45909 Unspecified asthma, uncomplicated: Secondary | ICD-10-CM | POA: Diagnosis present

## 2021-02-07 DIAGNOSIS — Z87820 Personal history of traumatic brain injury: Secondary | ICD-10-CM

## 2021-02-07 DIAGNOSIS — L03115 Cellulitis of right lower limb: Secondary | ICD-10-CM | POA: Insufficient documentation

## 2021-02-07 DIAGNOSIS — L0390 Cellulitis, unspecified: Secondary | ICD-10-CM | POA: Diagnosis not present

## 2021-02-07 DIAGNOSIS — I872 Venous insufficiency (chronic) (peripheral): Secondary | ICD-10-CM | POA: Diagnosis present

## 2021-02-07 DIAGNOSIS — Z833 Family history of diabetes mellitus: Secondary | ICD-10-CM

## 2021-02-07 DIAGNOSIS — Z79899 Other long term (current) drug therapy: Secondary | ICD-10-CM

## 2021-02-07 DIAGNOSIS — Z7951 Long term (current) use of inhaled steroids: Secondary | ICD-10-CM

## 2021-02-07 DIAGNOSIS — M869 Osteomyelitis, unspecified: Secondary | ICD-10-CM

## 2021-02-07 DIAGNOSIS — D649 Anemia, unspecified: Secondary | ICD-10-CM | POA: Diagnosis present

## 2021-02-07 DIAGNOSIS — L03116 Cellulitis of left lower limb: Secondary | ICD-10-CM | POA: Diagnosis present

## 2021-02-07 DIAGNOSIS — J42 Unspecified chronic bronchitis: Secondary | ICD-10-CM | POA: Diagnosis present

## 2021-02-07 DIAGNOSIS — I10 Essential (primary) hypertension: Secondary | ICD-10-CM | POA: Diagnosis present

## 2021-02-07 DIAGNOSIS — K219 Gastro-esophageal reflux disease without esophagitis: Secondary | ICD-10-CM | POA: Diagnosis present

## 2021-02-07 DIAGNOSIS — E785 Hyperlipidemia, unspecified: Secondary | ICD-10-CM | POA: Diagnosis present

## 2021-02-07 DIAGNOSIS — L97818 Non-pressure chronic ulcer of other part of right lower leg with other specified severity: Secondary | ICD-10-CM | POA: Diagnosis present

## 2021-02-07 DIAGNOSIS — Z20822 Contact with and (suspected) exposure to covid-19: Secondary | ICD-10-CM | POA: Diagnosis present

## 2021-02-07 LAB — CBC WITH DIFFERENTIAL/PLATELET
Abs Immature Granulocytes: 0.01 10*3/uL (ref 0.00–0.07)
Basophils Absolute: 0 10*3/uL (ref 0.0–0.1)
Basophils Relative: 1 %
Eosinophils Absolute: 0.3 10*3/uL (ref 0.0–0.5)
Eosinophils Relative: 7 %
HCT: 31.5 % — ABNORMAL LOW (ref 39.0–52.0)
Hemoglobin: 9.8 g/dL — ABNORMAL LOW (ref 13.0–17.0)
Immature Granulocytes: 0 %
Lymphocytes Relative: 22 %
Lymphs Abs: 0.9 10*3/uL (ref 0.7–4.0)
MCH: 29.6 pg (ref 26.0–34.0)
MCHC: 31.1 g/dL (ref 30.0–36.0)
MCV: 95.2 fL (ref 80.0–100.0)
Monocytes Absolute: 0.5 10*3/uL (ref 0.1–1.0)
Monocytes Relative: 12 %
Neutro Abs: 2.6 10*3/uL (ref 1.7–7.7)
Neutrophils Relative %: 58 %
Platelets: 180 10*3/uL (ref 150–400)
RBC: 3.31 MIL/uL — ABNORMAL LOW (ref 4.22–5.81)
RDW: 14.7 % (ref 11.5–15.5)
WBC: 4.4 10*3/uL (ref 4.0–10.5)
nRBC: 0 % (ref 0.0–0.2)

## 2021-02-07 LAB — URINALYSIS, ROUTINE W REFLEX MICROSCOPIC
Bacteria, UA: NONE SEEN
Bilirubin Urine: NEGATIVE
Glucose, UA: NEGATIVE mg/dL
Hgb urine dipstick: NEGATIVE
Ketones, ur: 5 mg/dL — AB
Leukocytes,Ua: NEGATIVE
Nitrite: NEGATIVE
Protein, ur: 30 mg/dL — AB
Specific Gravity, Urine: 1.039 — ABNORMAL HIGH (ref 1.005–1.030)
pH: 5 (ref 5.0–8.0)

## 2021-02-07 LAB — COMPREHENSIVE METABOLIC PANEL
ALT: 14 U/L (ref 0–44)
AST: 19 U/L (ref 15–41)
Albumin: 3.3 g/dL — ABNORMAL LOW (ref 3.5–5.0)
Alkaline Phosphatase: 46 U/L (ref 38–126)
Anion gap: 7 (ref 5–15)
BUN: 14 mg/dL (ref 8–23)
CO2: 26 mmol/L (ref 22–32)
Calcium: 9 mg/dL (ref 8.9–10.3)
Chloride: 106 mmol/L (ref 98–111)
Creatinine, Ser: 0.99 mg/dL (ref 0.61–1.24)
GFR, Estimated: >60 mL/min (ref >60)
Glucose, Bld: 99 mg/dL (ref 70–99)
Potassium: 3.6 mmol/L (ref 3.5–5.1)
Sodium: 139 mmol/L (ref 135–145)
Total Bilirubin: 0.5 mg/dL (ref 0.3–1.2)
Total Protein: 6.2 g/dL — ABNORMAL LOW (ref 6.5–8.1)

## 2021-02-07 LAB — LACTIC ACID, PLASMA: Lactic Acid, Venous: 0.8 mmol/L (ref 0.5–1.9)

## 2021-02-07 NOTE — ED Triage Notes (Signed)
Pt sent from wound care for possible IV antibiotics for chronic wound to right leg they have been treating since 10/20/20. They did culture of wound today and sent to ER for further work up.

## 2021-02-07 NOTE — Progress Notes (Addendum)
Perry, Cuevas ([id number]) Visit Report for 02/07/2021 Arrival Information Details Patient Name: [name], [name] Date of Service: 02/07/2021 2:45 PM Medical Record Number: [id number] Patient Account Number: [id number] Date of Birth/Sex: Dec 07, 1950 (70 y.o. M) Treating RN: Carlene Coria Primary Care Lia Vigilante: Barney Drain Other Clinician: Jeanine Luz Referring Kendre Jacinto: Barney Drain Treating Demetra Moya/Extender: Skipper Cliche in Treatment: 6 Visit Information History Since Last Visit Added or deleted any medications: No Patient Arrived: [name] Had a fall or experienced change in No Arrival Time: 15:04 activities of daily living that may affect Accompanied By: self risk of falls: Transfer Assistance: None Hospitalized since last visit: No Patient Identification Verified: Yes Pain Present Now: Yes Secondary Verification Process Completed: Yes Patient Requires Transmission-Based No Precautions: Patient Has Alerts: Yes Patient Alerts: NOT Diabetic ABI: R)0.91 L) 1.18 TBI R) 0.76 L) 0.73 12/02/2020 [facility] Electronic Signature(s) Signed: 02/07/2021 4:05:02 PM By: Jeanine Luz Entered By: Jeanine Luz on 02/07/2021 15:05:17 [name] ([id number]) -------------------------------------------------------------------------------- Encounter Discharge Information Details Patient Name: [name] Date of Service: 02/07/2021 2:45 PM Medical Record Number: [id number] Patient Account Number: [id number] Date of Birth/Sex: 05-May-1951 (70 y.o. M) Treating RN: Dolan Amen Primary Care Eltha Tingley: Barney Drain Other Clinician: Jeanine Luz Referring Sreya Froio: Barney Drain Treating Awais Cobarrubias/Extender: Skipper Cliche in Treatment: 6 Encounter Discharge Information Items Discharge Condition: Stable Ambulatory Status: [place] Discharge Destination: Hospital Orders Sent: Yes Transportation: Other Accompanied By: Education officer, museum Schedule Follow-up  Appointment: Yes Clinical Summary of Care: Notes pt sent to hospital non-emergent via [facility] transportation d/t increased redness and swelling Electronic Signature(s) Signed: 02/07/2021 4:03:36 PM By: Georges Mouse, Minus Breeding RN Entered By: Georges Mouse, Minus Breeding on 02/07/2021 16:03:36 [name] ([id number]) -------------------------------------------------------------------------------- Lower Extremity Assessment Details Patient Name: [name] Date of Service: 02/07/2021 2:45 PM Medical Record Number: [id number] Patient Account Number: [id number] Date of Birth/Sex: 11/24/1950 (70 y.o. M) Treating RN: Carlene Coria Primary Care Merville Hijazi: Barney Drain Other Clinician: Jeanine Luz Referring Airyanna Dipalma: Barney Drain Treating Chantalle Defilippo/Extender: Skipper Cliche in Treatment: 6 Edema Assessment Assessed: [Left: No] [Right: Yes] Edema: [Left: Ye] [Right: s] Calf Left: Right: Point of Measurement: 27 cm From Medial Instep 38 cm Ankle Left: Right: Point of Measurement: 12 cm From Medial Instep 28.2 cm Vascular Assessment Pulses: Dorsalis Pedis Palpable: [Right:Yes] Electronic Signature(s) Signed: 02/07/2021 4:05:02 PM By: Jeanine Luz Signed: 02/25/2021 8:11:40 AM By: Carlene Coria RN Entered By: Jeanine Luz on 02/07/2021 15:20:35 [name] ([id number]) -------------------------------------------------------------------------------- Multi Wound Chart Details Patient Name: [name] Date of Service: 02/07/2021 2:45 PM Medical Record Number: [id number] Patient Account Number: [id number] Date of Birth/Sex: 1951/01/13 (70 y.o. M) Treating RN: Carlene Coria Primary Care Alexie Samson: Barney Drain Other Clinician: Jeanine Luz Referring Julieanna Geraci: Barney Drain Treating Kempton Milne/Extender: Skipper Cliche in Treatment: 6 Vital Signs Height(in): 67 Pulse(bpm): 34 Weight(lbs): 170 Blood Pressure(mmHg): 110/69 Body Mass Index(BMI):  27 Temperature(F): 98.0 Respiratory Rate(breaths/min): 18 Photos: [N/A:N/A] Wound Location: Right, Circumferential Lower Leg N/A N/A Wounding Event: Trauma N/A N/A Primary Etiology: Trauma, Other N/A N/A Secondary Etiology: Venous Leg Ulcer N/A N/A Comorbid History: Hypertension, History of pressure N/A N/A wounds, Neuropathy Date Acquired: 10/20/2020 N/A N/A Weeks of Treatment: 6 N/A N/A Wound Status: Open N/A N/A Measurements L x W x D (cm) 9.4x14x0.2 N/A N/A Area (cm) : 103.358 N/A N/A Volume (cm) : 20.672 N/A N/A % Reduction in Area: -82.80% N/A N/A % Reduction in Volume: -82.80% N/A N/A Classification: Full Thickness Without Exposed N/A N/A Support Structures Exudate Amount: Large N/A  N/A Exudate Type: Serosanguineous N/A N/A Exudate Color: red, brown N/A N/A Wound Margin: Indistinct, nonvisible N/A N/A Granulation Amount: Small (1-33%) N/A N/A Granulation Quality: Pink, Hyper-granulation N/A N/A Necrotic Amount: Large (67-100%) N/A N/A Exposed Structures: Fat Layer (Subcutaneous Tissue): N/A N/A Yes Fascia: No Tendon: No Muscle: No Joint: No Bone: No Epithelialization: None N/A N/A Treatment Notes Electronic Signature(s) Signed: 02/25/2021 8:11:40 AM By: Carlene Coria RN Entered By: Carlene Coria on 02/07/2021 15:25:41 [name] ([id number]) -------------------------------------------------------------------------------- Multi-Disciplinary Care Plan Details Patient Name: [name] Date of Service: 02/07/2021 2:45 PM Medical Record Number: [id number] Patient Account Number: [id number] Date of Birth/Sex: 06-05-1951 (70 y.o. M) Treating RN: Carlene Coria Primary Care Teya Otterson: Barney Drain Other Clinician: Jeanine Luz Referring Serin Thornell: Barney Drain Treating Tedd Cottrill/Extender: Skipper Cliche in Treatment: 6 Active Inactive Electronic Signature(s) Signed: 03/02/2021 5:58:01 PM By: Gretta Cool, BSN, RN, CWS, Kim RN, BSN Signed: 03/07/2021  7:55:55 AM By: Carlene Coria RN Previous Signature: 02/25/2021 8:11:40 AM Version By: Carlene Coria RN Entered By: Gretta Cool BSN, RN, CWS, Kim on 03/02/2021 17:58:01 [name] ([id number]) -------------------------------------------------------------------------------- Pain Assessment Details Patient Name: [name] Date of Service: 02/07/2021 2:45 PM Medical Record Number: [id number] Patient Account Number: [id number] Date of Birth/Sex: Jun 16, 1951 (70 y.o. M) Treating RN: Carlene Coria Primary Care Shanisha Lech: Barney Drain Other Clinician: Jeanine Luz Referring Mikiala Fugett: Barney Drain Treating Kelon Easom/Extender: Skipper Cliche in Treatment: 6 Active Problems Location of Pain Severity and Description of Pain Patient Has Paino Yes Site Locations Rate the pain. Current Pain Level: 8 Pain Management and Medication Current Pain Management: Electronic Signature(s) Signed: 02/07/2021 4:05:02 PM By: Jeanine Luz Signed: 02/25/2021 8:11:40 AM By: Carlene Coria RN Entered By: Jeanine Luz on 02/07/2021 15:07:24 [name] ([id number]) -------------------------------------------------------------------------------- Wound Assessment Details Patient Name: [name] Date of Service: 02/07/2021 2:45 PM Medical Record Number: [id number] Patient Account Number: [id number] Date of Birth/Sex: 31-Dec-1950 (69 y.o. M) Treating RN: Carlene Coria Primary Care Jvon Meroney: Barney Drain Other Clinician: Jeanine Luz Referring Jahmeer Porche: Barney Drain Treating Noelly Lasseigne/Extender: Skipper Cliche in Treatment: 6 Wound Status Wound Number: 1 Primary Etiology: Trauma, Other Wound Location: Right, Circumferential Lower Leg Secondary Venous Leg Ulcer Etiology: Wounding Event: Trauma Wound Status: Open Date Acquired: 10/20/2020 Comorbid History: Hypertension, History of pressure wounds, Weeks Of Treatment: 6 Neuropathy Clustered Wound: No Photos Wound  Measurements Length: (cm) 9.4 Width: (cm) 14 Depth: (cm) 0.2 Area: (cm) 103.358 Volume: (cm) 20.672 % Reduction in Area: -82.8% % Reduction in Volume: -82.8% Epithelialization: None Tunneling: No Undermining: No Wound Description Classification: Full Thickness Without Exposed Support Structu Wound Margin: Indistinct, nonvisible Exudate Amount: Large Exudate Type: Serosanguineous Exudate Color: red, brown res Foul Odor After Cleansing: No Slough/Fibrino Yes Wound Bed Granulation Amount: Small (1-33%) Exposed Structure Granulation Quality: Pink, Hyper-granulation Fascia Exposed: No Necrotic Amount: Large (67-100%) Fat Layer (Subcutaneous Tissue) Exposed: Yes Necrotic Quality: Adherent Slough Tendon Exposed: No Muscle Exposed: No Joint Exposed: No Bone Exposed: No Electronic Signature(s) Signed: 02/07/2021 4:05:02 PM By: Jeanine Luz Signed: 02/25/2021 8:11:40 AM By: Carlene Coria RN Entered By: Jeanine Luz on 02/07/2021 15:20:05 [name] ([id number]) -------------------------------------------------------------------------------- Vitals Details Patient Name: [name] Date of Service: 02/07/2021 2:45 PM Medical Record Number: [id number] Patient Account Number: [id number] Date of Birth/Sex: [birth year] (69 y.o. M) Treating RN: Carlene Coria Primary Care Murdis Flitton: Barney Drain Other Clinician: Jeanine Luz Referring Nyle Limb: Barney Drain Treating Keatyn Luck/Extender: Skipper Cliche in Treatment: 6 Vital Signs Time Taken: 03:02 Temperature (F): 98.0 Height (in): 67 Pulse (  bpm): 69 Weight (lbs): 170 Respiratory Rate (breaths/min): 18 Body Mass Index (BMI): 26.6 Blood Pressure (mmHg): 110/69 Reference Range: 80 - 120 mg / dl Electronic Signature(s) Signed: 02/07/2021 4:05:02 PM By: Jeanine Luz Entered By: Jeanine Luz on 02/07/2021 15:07:15

## 2021-02-07 NOTE — Progress Notes (Addendum)
[name], [name] ([id number]) Visit Report for 02/07/2021 Chief Complaint Document Details Patient Name: [name], [name] Date of Service: 02/07/2021 2:45 PM Medical Record Number: [id number] Patient Account Number: [id number] Date of Birth/Sex: 1951-06-05 (69 y.o. M) Treating RN: Carlene Coria Primary Care Provider: Barney Drain Other Clinician: Jeanine Luz Referring Provider: Barney Drain Treating Provider/Extender: Skipper Cliche in Treatment: 6 Information Obtained from: Patient Chief Complaint Right LE Ulcer Electronic Signature(s) Signed: 02/07/2021 2:53:10 PM By: Worthy Keeler PA-C Entered By: Worthy Keeler on 02/07/2021 14:53:10 [name] ([id number]) -------------------------------------------------------------------------------- HPI Details Patient Name: [name] Date of Service: 02/07/2021 2:45 PM Medical Record Number: [id number] Patient Account Number: [id number] Date of Birth/Sex: 02-28-1951 (69 y.o. M) Treating RN: Carlene Coria Primary Care Provider: Barney Drain Other Clinician: Jeanine Luz Referring Provider: Barney Drain Treating Provider/Extender: Skipper Cliche in Treatment: 6 History of Present Illness HPI Description: 12/21/2020 upon evaluation today patient appears to be doing somewhat poorly in regard to his right lower extremity. He does have some erythema and is also having issues with quite a bit of drainage currently from a wound that he tells me has been present since about 2 months ago when he initially had an injury that led to this wound beginning. He does have chronic venous stasis and is secondary to this I think it has been more difficult to heal this wound than normal. With that being said the patient tells me that this tends to drain a lot down onto his feet. He is also had some issues with fungal infection he is currently on miconazole powder for that. Fortunately there is no signs of active  infection systemically at this time. No fevers, chills, nausea, vomiting, or diarrhea. He does have pain but again this is more to the posterior aspect not really more lateral or anterior. There is evidence that he may have some local infection due to erythema and warmth surrounding the wound post debridement I do want to obtain a wound culture to evaluate and see if there is anything indeed of concern here. He did have arterial studies which revealed that he had a ABI of 0.91 on the right with a TBI of 0.76 in the left his ABI was 1.18 with a TBI of 0.73. He has good blood flow here. In regard to his x-ray that was taken on 11/30/2020 there was no evidence of osseous abnormality. The arterial study was actually performed on 12/03/2020. The patient was hospitalized from 11/30/2020 through 12/02/2020 during that time he was on IV antibiotics including vancomycin and cefepime. 12/28/2020 upon evaluation today patient appears to be doing somewhat poorly in regard to his legs currently is having a lot of drainage unfortunately which again he was not actually being wrapped with a 3 layer compression wrap after this was changed at pace. Subsequently I think that the patient does need to have ongoing compression therapy. Right now that means that were doing the compression wrap later he will need compression stockings. Either way though I think that he cannot just have a border foam dressing on this which was what was on today that is not sufficient to catch any of the drainage and to be honest I think this can lead to infection and greater issues here for him. The wound is pretty much about circumferential at this point a weeping area included. 01/04/2021 upon evaluation today patient unfortunately is doing quite a bit worse in regard to the overall appearance of his wound from the  standpoint of infection. I did see evidence of bright green drainage consistent with Pseudomonas which I discussed with the patient  today as well as his representative from social services. Nonetheless I believe that we likely need to treat him for Pseudomonas right now the clindamycin he is on is not covering for this. With that being said no sharp debridement was performed today. Of note the patient's Hydrofera Blue dressing was not the ready transfer version and unfortunately did cause this did not drain quite as well as we would like to. Nonetheless I do feel like that the patient would benefit from continuing with the wraps changed out their pace I think they can definitely do a good job and his swelling is under good control the big thing here is that we need to make sure that the dressing is applied appropriately we will contact them just to confirm and make sure that they know the appropriate way to apply the [facility] that they have. 01/11/2021 upon evaluation today patient appears to be doing somewhat better in regard to the erythema on his leg at this point. Fortunately there are no signs of active infection at this time. No fevers, chills, nausea, vomiting, or diarrhea. With that being said he has been tolerating the dressing changes without complication and very pleased in that regard. I do think that he seems to be making good progress in general which is good news. I think that the biggest issue I see right now is simply the [facility] that is being used cannot transfer through to the [place] and therefore extracting a lot more fluid against the skin which is causing some irritation as well. I think that we may be able to wrap him here and get things in a better position for him to be honest. 01/18/2021 upon evaluation today patient appears to be doing better in regard to his leg. I feel like he is definitely making improvement and overall I think that this is significantly improved compared to the previous evaluation. With that being said there is still quite a ways to go to get this to heal and it is going  to take quite a bit of time. Fortunately there is no evidence of active infection at this time. No fevers, chills, nausea, vomiting, or diarrhea. 01/25/2021 upon evaluation today patient appears to be doing well with regard to his leg. I feel like he is definitely showing signs of improvement which is great news. There is no evidence of active infection at this time. No fevers, chills, nausea, vomiting, or diarrhea. 02/01/2021 upon evaluation today patient appears to be doing decently well in regard to his leg on the right as compared to previously noted. There does not appear to be any signs of active infection at this time which is great news and overall I am pleased that his systemically. Nonetheless locally still show signs of erythema he still has several days left of his antibiotic as apparently this was not started immediately after I wrote that for him last time. Nonetheless I think that he is taking the Levaquin now and hopefully its doing job to help clear things up. Nonetheless if he is not significantly better by next week I would likely repeat the culture to try to see if anything is changed as again he still has some erythema this week that has me concerned at this point. 02/07/2021 on evaluation today patient actually appears to be doing well with regard to his wounds in his far  as size and overall appearance is concerned. He still having a tremendous amount of drainage she has now completed the Levaquin and unfortunately as opposed to improving the erythema actually seems to have worsened. Has been off the Levaquin for several days therefore I did obtain a new culture today. With that being said I nonetheless feel like that the patient is showing signs of ongoing issues with cellulitis and to be honest not only is he having issues on the right leg which is what we have been monitoring here but he now is having issues on the left leg as well unfortunately. This is even up and around the  knee and again I think this is of significant concern to the point that I feel like he should go to the ER for further evaluation and treatment. Electronic Signature(s) Signed: 02/07/2021 3:37:51 PM By: Worthy Keeler PA-C Entered By: Worthy Keeler on 02/07/2021 15:37:51 [name] ([id number]) [name] ([id number]) -------------------------------------------------------------------------------- Physical Exam Details Patient Name: [name] Date of Service: 02/07/2021 2:45 PM Medical Record Number: [id number] Patient Account Number: [id number] Date of Birth/Sex: 1951-09-17 (69 y.o. M) Treating RN: Carlene Coria Primary Care Provider: Barney Drain Other Clinician: Jeanine Luz Referring Provider: Barney Drain Treating Provider/Extender: Skipper Cliche in Treatment: 6 Constitutional Well-nourished and well-hydrated in no acute distress. Respiratory normal breathing without difficulty. Psychiatric this patient is able to make decisions and demonstrates good insight into disease process. Alert and Oriented x 3. pleasant and cooperative. Notes Upon inspection patient's wound bed actually showed signs of continued and fairly significant drainage noted at this point with regard to the right lower extremity. He actually has a lot of new skin growth and there is a lot that has been protected by the zinc and I think that is done a great job. With that being said unfortunately despite being on another round of the Levaquin this seems to have not really completely cleared he is continue to have discomfort and unfortunately the erythema is actually spread up into the region just above the knee into the distal thigh. That is on the right leg. I am also seeing issues of potential cellulitis around the knee on the left. Overall this has been more concerned about the potential for overall worsening and again I explained to the guardian with the patient today that at  the same time I feel like this is likely can require some IV antibiotic intervention that means he really needs go to the ER for further evaluation and treatment. Electronic Signature(s) Signed: 02/07/2021 3:39:32 PM By: Worthy Keeler PA-C Entered By: Worthy Keeler on 02/07/2021 15:39:32 [name] ([id number]) -------------------------------------------------------------------------------- Physician Orders Details Patient Name: [name] Date of Service: 02/07/2021 2:45 PM Medical Record Number: [id number] Patient Account Number: [id number] Date of Birth/Sex: 1951/03/07 (69 y.o. 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 o Nurse Visit as needed - [place] twice a week Bathing/ Shower/ Hygiene o May shower with wound dressing protected with water repellent cover or cast protector. Edema  Control - Lymphedema / Segmental Compressive Device / Other Right Lower Extremity o Optional: One layer of unna paste to top of compression wrap (to act as an anchor). o 3 Layer Compression System for Lymphedema. Wound Treatment Wound #1 - Lower Leg Wound Laterality: Right, Circumferential Cleanser: Normal Saline 3 x Per Week/30 Days Discharge Instructions: Wash your hands with soap and water. Remove old dressing, discard into plastic bag and place into trash. Cleanse the wound with Normal Saline  prior to applying a clean dressing using gauze sponges, not tissues or cotton balls. Do not scrub or use excessive force. Pat dry using gauze sponges, not tissue or cotton balls. Cleanser: Soap and Water 3 x Per Week/30 Days Discharge Instructions: Gently cleanse wound with antibacterial soap, rinse and pat dry prior to dressing wounds Peri-Wound Care: Desitin Maximum Strength Ointment 4 (oz) 3 x Per Week/30 Days Primary Dressing: Hydrofera Blue Ready Transfer Foam, 4x5 (in/in) 3 x Per Week/30 Days Discharge Instructions: Apply Hydrofera Blue Ready to wound bed as directed Secondary Dressing: Xtrasorb Large 6x9 (in/in) 3 x Per Week/30 Days Discharge Instructions: Apply to wound as directed. Do not cut. Compression Wrap: Profore Lite LF 3 Multilayer Compression Bandaging System 3 x Per Week/30 Days Discharge Instructions: Apply 3 multi-layer wrap as prescribed. Laboratory o Bacteria identified in Wound by Culture (MICRO) - right lower leg non healing wound with increased , pain, swelling, redness, warmth - (ICD10 L97.812 - 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r exposed) oooo LOINC Code: 3419-3 oooo Convenience Name: Wound culture routine Notes patient sent to ED for eval of lower leg cellulitis BOUBACAR, LERETTE ([id number]) Electronic Signature(s) Signed: 02/07/2021 4:49:49 PM By: Worthy Keeler PA-C Signed: 02/25/2021 8:11:40 AM By: Carlene Coria RN Entered By: Carlene Coria on 02/07/2021 15:35:21 [name] ([id number]) -------------------------------------------------------------------------------- Problem List Details Patient Name: [name] Date of Service: 02/07/2021 2:45 PM Medical Record Number: [id number] Patient Account Number: [id number] Date of Birth/Sex: [birth year] (70 y.o. 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 M) Treating RN: Carlene Coria Primary Care Provider: Barney Drain Other Clinician: Jeanine Luz Referring Provider: Barney Drain Treating Provider/Extender: Skipper Cliche in Treatment: 6 Subjective Chief Complaint Information obtained from Patient Right LE Ulcer History of Present Illness (HPI) 12/21/2020 upon evaluation today patient appears to be doing somewhat poorly in regard to his right lower extremity. He does have some erythema and is also having issues with quite a bit of drainage currently from a wound that he tells me has been present since about 2 months ago when he initially had an injury that led to this wound beginning. He does have chronic  venous stasis and is secondary to this I think it has been more difficult to heal this wound than normal. With that being said the patient tells me that this tends to drain a lot down onto his feet. He is also had some issues with fungal infection he  is currently on miconazole powder for that. Fortunately there is no signs of active infection systemically at this time. No fevers, chills, nausea, vomiting, or diarrhea. He does have pain but again this is more to the posterior aspect not really more lateral or anterior. There is evidence that he may have some local infection due to erythema and warmth surrounding the wound post debridement I do want to obtain a wound culture to evaluate and see if there is anything indeed of concern here. He did have arterial studies which revealed that he had a ABI of 0.91 on the right with a TBI of 0.76 in the left his ABI was 1.18 with a TBI of 0.73. He has good blood flow here. In regard to his x-ray that was taken on 11/30/2020 there was no evidence of osseous abnormality. The arterial study was actually performed on 12/03/2020. The patient was hospitalized from 11/30/2020 through 12/02/2020 during that time he was on IV antibiotics including vancomycin and cefepime. 02/07/2021 on evaluation today patient actually appears to be doing well with regard to his wounds in his far as size and overall appearance is concerned. He still having a tremendous amount of drainage she has now completed the Levaquin and unfortunately as opposed to improving the erythema actually seems to have worsened. Has been off the Levaquin for several days therefore I did obtain a new culture today. With that being said I nonetheless feel like that the patient is showing signs of ongoing issues with cellulitis and to be honest not only is he having issues on the right leg which is what we have been monitoring here but he now is having issues on the left leg as well unfortunately. This is even up and around the knee and again I think this is of significant concern to the point that I feel like he should go to the ER for further evaluation and treatment. Objective Constitutional Well-nourished and well-hydrated in no acute distress. Vitals  Time Taken: 3:02 AM, Height: 67 in, Weight: 170 lbs, BMI: 26.6, Temperature: 98.0 F, Pulse: 69 bpm, Respiratory Rate: 18 breaths/min, Blood Pressure: 110/69 mmHg. Respiratory normal breathing without difficulty. Psychiatric this patient is able to make decisions and demonstrates good insight into disease process. Alert and Oriented x 3. pleasant and cooperative. General Notes: Upon inspection patient's wound bed actually showed signs of continued and fairly significant drainage noted at this point with regard to the right lower extremity. He actually has a lot of new skin growth and there is a lot that has been protected by the zinc and I think that is done a great job. With that being said unfortunately despite being on another round of the Levaquin this seems to have not really completely cleared he is continue to have discomfort and unfortunately the erythema is actually spread up into the region just above the knee into the distal thigh. That is on the right leg. I am also seeing issues of potential cellulitis around the knee on the left. Overall this has been more concerned about the potential for overall worsening and again I explained to the guardian with the patient today that at the same time I feel like this is likely  can require some IV antibiotic intervention that means he really needs go to the ER for further evaluation and treatment. Integumentary (Hair, Skin) Wound #1 status is Open. Original cause of wound was Trauma. The date acquired was: 10/20/2020. The wound has been in treatment 6 weeks. The wound is located on the Right,Circumferential Lower Leg. The wound measures 9.4cm length x 14cm width x 0.2cm depth; 103.358cm^2 area and 20.672cm^3 volume. There is Fat Layer (Subcutaneous Tissue) exposed. There is no tunneling or undermining noted. There is a large amount of serosanguineous drainage noted. The wound margin is indistinct and nonvisible. There is small (1-33%) pink, hyper -  granulation KASTIEL, SIMONIAN C. ([id number]) within the wound bed. There is a large (67-100%) amount of necrotic tissue within the wound bed including Adherent Slough. 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 Nurse Visit as needed - [place] twice a week Bathing/ Shower/ Hygiene: May shower with wound dressing protected with water repellent cover or cast protector. Edema Control - Lymphedema / Segmental Compressive Device / Other: Optional: One layer of unna paste to top of compression wrap (to act as an anchor). 3 Layer Compression System for Lymphedema. Laboratory ordered were: Wound culture routine - right lower leg non healing wound with increased , pain, swelling, redness, warmth General Notes: patient sent to ED for eval of lower leg cellulitis WOUND #1: - Lower Leg Wound Laterality: Right, Circumferential Cleanser: Normal Saline 3 x Per Week/30 Days Discharge Instructions: Wash your hands with soap and water. Remove old dressing, discard into plastic bag and place into trash. Cleanse the wound with Normal Saline prior to applying a clean dressing using gauze sponges, not tissues or cotton balls. Do not scrub or use excessive force. Pat dry using gauze sponges, not tissue or cotton balls. Cleanser: Soap and Water 3 x Per Week/30 Days Discharge Instructions: Gently cleanse wound with antibacterial soap, rinse and pat dry prior to dressing wounds Peri-Wound Care: Desitin Maximum Strength Ointment 4 (oz) 3 x Per Week/30 Days Primary Dressing: Hydrofera Blue Ready Transfer Foam, 4x5 (in/in) 3 x Per Week/30 Days Discharge Instructions: Apply Hydrofera Blue Ready to wound bed as  directed Secondary Dressing: Xtrasorb Large 6x9 (in/in) 3 x Per Week/30 Days Discharge Instructions: Apply to wound as directed. Do not cut. Compression Wrap: Profore Lite LF 3 Multilayer Compression Bandaging System 3 x Per Week/30 Days Discharge Instructions: Apply 3 multi-layer wrap as prescribed. 1. I did go and obtain a wound culture today that will be sent into the lab. We will see what that shows but again I am recommending the patient go to the ER so again I think that will be more of a on the back end help as far as tailoring to more specific treatment in the long run. Does not really help much today. 2. I am going to suggest that the patient go to the ER for further evaluation and treatment. I feel like that this is something that is necessary in order to see about potential for IV antibiotic therapy as the erythema seems to be spreading up the leg and into the distal thigh on the right and even around the knee on the left. 3. I am also can recommend the patient continue obviously to elevate his legs even if  he is in the hospital setting. Obviously as soon as he is out we will be more than happy to see him back in order to go ahead and change out his compression wraps on a regular basis we have been seeing him 3 times a week to replace the 3 layer compression wrap which has done an excellent job for him up to this point. In fact he has a lot of new skin growth but again I am more concerned about the cellulitis. We will see the patient back for follow-up following the ER visit. Electronic Signature(s) Signed: 02/07/2021 3:45:44 PM By: Worthy Keeler PA-C Entered By: Worthy Keeler on 02/07/2021 15:45:43 [name] ([id number]) -------------------------------------------------------------------------------- SuperBill Details Patient Name: [name] Date of Service: 02/07/2021 Medical Record Number: [id number] Patient Account Number: [id number] Date of Birth/Sex:  Jun 25, 1951 (69 y.o. 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 F79 Unspecified intellectual disabilities Physician Procedures CPT4 Code: [id number] Description: 99214 - WC PHYS LEVEL 4 - EST PT Modifier: Quantity: 1 CPT4 Code: Description: ICD-10 Diagnosis Description S81.801A Unspecified open wound, right lower leg, initial encounter I87.2 Venous insufficiency (chronic) (peripheral) L97.812 Non-pressure chronic ulcer of other part of right lower leg with fat la Z87.820  Personal history of traumatic brain injury Modifier: yer exposed Quantity: Electronic Signature(s) Signed: 02/07/2021 3:48:39 PM By: Worthy Keeler PA-C Entered By: Worthy Keeler on 02/07/2021 15:48:38

## 2021-02-08 ENCOUNTER — Encounter (HOSPITAL_COMMUNITY): Payer: Self-pay | Admitting: Internal Medicine

## 2021-02-08 ENCOUNTER — Observation Stay (HOSPITAL_COMMUNITY): Payer: Medicare (Managed Care)

## 2021-02-08 DIAGNOSIS — L0390 Cellulitis, unspecified: Secondary | ICD-10-CM | POA: Diagnosis present

## 2021-02-08 LAB — SARS CORONAVIRUS 2 (TAT 6-24 HRS): SARS Coronavirus 2: NEGATIVE

## 2021-02-08 MED ORDER — HYDROCORTISONE 1 % EX CREA
1.0000 "application " | TOPICAL_CREAM | Freq: Every day | CUTANEOUS | Status: DC
  Administered 2021-02-10 – 2021-02-11 (×2): 1 via TOPICAL
  Filled 2021-02-08 (×2): qty 28

## 2021-02-08 MED ORDER — SODIUM CHLORIDE 0.9 % IV SOLN
2.0000 g | Freq: Three times a day (TID) | INTRAVENOUS | Status: DC
  Administered 2021-02-08 – 2021-02-11 (×9): 2 g via INTRAVENOUS
  Filled 2021-02-08 (×9): qty 2

## 2021-02-08 MED ORDER — ACETAMINOPHEN 325 MG PO TABS
650.0000 mg | ORAL_TABLET | Freq: Four times a day (QID) | ORAL | Status: DC | PRN
  Administered 2021-02-08 – 2021-02-11 (×4): 650 mg via ORAL
  Filled 2021-02-08 (×4): qty 2

## 2021-02-08 MED ORDER — SODIUM CHLORIDE 0.9 % IV SOLN
2.0000 g | Freq: Once | INTRAVENOUS | Status: AC
  Administered 2021-02-08: 2 g via INTRAVENOUS
  Filled 2021-02-08: qty 2

## 2021-02-08 MED ORDER — MOMETASONE FURO-FORMOTEROL FUM 200-5 MCG/ACT IN AERO
2.0000 | INHALATION_SPRAY | Freq: Two times a day (BID) | RESPIRATORY_TRACT | Status: DC
  Administered 2021-02-08 – 2021-02-11 (×7): 2 via RESPIRATORY_TRACT
  Filled 2021-02-08: qty 8.8

## 2021-02-08 MED ORDER — LOSARTAN POTASSIUM 50 MG PO TABS
50.0000 mg | ORAL_TABLET | Freq: Every day | ORAL | Status: DC
  Administered 2021-02-08: 50 mg via ORAL
  Filled 2021-02-08: qty 1

## 2021-02-08 MED ORDER — ALBUTEROL SULFATE HFA 108 (90 BASE) MCG/ACT IN AERS: 1.0000 | INHALATION_SPRAY | RESPIRATORY_TRACT | Status: DC | PRN

## 2021-02-08 MED ORDER — LORATADINE 10 MG PO TABS
10.0000 mg | ORAL_TABLET | Freq: Every day | ORAL | Status: DC
  Administered 2021-02-08 – 2021-02-11 (×4): 10 mg via ORAL
  Filled 2021-02-08 (×4): qty 1

## 2021-02-08 MED ORDER — VANCOMYCIN HCL 1000 MG/200ML IV SOLN: 1000.0000 mg | Freq: Once | INTRAVENOUS | Status: DC

## 2021-02-08 MED ORDER — VANCOMYCIN HCL 1500 MG/300ML IV SOLN
1500.0000 mg | Freq: Once | INTRAVENOUS | Status: AC
  Administered 2021-02-08: 1500 mg via INTRAVENOUS
  Filled 2021-02-08: qty 300

## 2021-02-08 MED ORDER — TAMSULOSIN HCL 0.4 MG PO CAPS
0.4000 mg | ORAL_CAPSULE | Freq: Every day | ORAL | Status: DC
  Administered 2021-02-08 – 2021-02-11 (×4): 0.4 mg via ORAL
  Filled 2021-02-08 (×4): qty 1

## 2021-02-08 MED ORDER — ATORVASTATIN CALCIUM 10 MG PO TABS
20.0000 mg | ORAL_TABLET | Freq: Every day | ORAL | Status: DC
  Administered 2021-02-08 – 2021-02-11 (×4): 20 mg via ORAL
  Filled 2021-02-08 (×4): qty 2

## 2021-02-08 MED ORDER — VANCOMYCIN HCL 750 MG/150ML IV SOLN
750.0000 mg | Freq: Two times a day (BID) | INTRAVENOUS | Status: DC
  Administered 2021-02-08 – 2021-02-11 (×6): 750 mg via INTRAVENOUS
  Filled 2021-02-08 (×6): qty 150

## 2021-02-08 MED ORDER — FLUTICASONE PROPIONATE 50 MCG/ACT NA SUSP
1.0000 | Freq: Every day | NASAL | Status: DC
  Administered 2021-02-10 – 2021-02-11 (×2): 1 via NASAL
  Filled 2021-02-08: qty 16

## 2021-02-08 MED ORDER — ENOXAPARIN SODIUM 40 MG/0.4ML ~~LOC~~ SOLN
40.0000 mg | SUBCUTANEOUS | Status: DC
  Administered 2021-02-08 – 2021-02-10 (×3): 40 mg via SUBCUTANEOUS
  Filled 2021-02-08 (×3): qty 0.4

## 2021-02-08 MED ORDER — ACETAMINOPHEN 325 MG PO TABS
650.0000 mg | ORAL_TABLET | Freq: Four times a day (QID) | ORAL | Status: DC | PRN
  Administered 2021-02-08: 650 mg via ORAL
  Filled 2021-02-08: qty 2

## 2021-02-08 MED ORDER — TRIAMCINOLONE ACETONIDE 0.1 % EX CREA
TOPICAL_CREAM | Freq: Two times a day (BID) | CUTANEOUS | Status: DC
  Filled 2021-02-08 (×2): qty 15

## 2021-02-08 MED ORDER — ACETAMINOPHEN 650 MG RE SUPP: 650.0000 mg | Freq: Four times a day (QID) | RECTAL | Status: DC | PRN

## 2021-02-08 NOTE — Progress Notes (Signed)
Pharmacy Antibiotic Note  [name] is a 70 y.o. male admitted on 02/07/2021 with cellulitis. Pt has recurring leg wound that wound care has been treating since 10/20/2020. Wound was cultured and patient was sent to the ED for IV antibiotics. Patient recently finished a course of Levaquin this week that improved drainage but didn't adequately control worsening pain, redness and swelling. Previous wound cultures in 11/2020 grew klebsiella and pseudomonas which was treated with vanc/cefepime with good response. Pharmacy has been consulted for vancomycin dosing.  Plan:  Vancomycin 1500 mg IV x1 followed by Vancomycin 750 mg q12h (AUC 451, goal 400-550) Cefepime per MD Monitor wound culture, renal function, clinical progression and plan of treatment   Temp (24hrs), Avg:98.2 F (36.8 C), Min:97.4 F (36.3 C), Max:98.9 F (37.2 C)  Recent Labs  Lab 02/07/21 1641  WBC 4.4  CREATININE 0.99  LATICACIDVEN 0.8    CrCl cannot be calculated (Unknown ideal weight.).    No Known Allergies  Antimicrobials this admission: Vancomycin 3/22 >>  Cefepime 3/22 >>   Dose adjustments this admission: N/A  Microbiology results: 3/21 Clinic Wound Culture: pending  Thank you for allowing pharmacy to be a part of this patient's care.  Jacobo Forest PharmD Candidate 2022 02/08/2021 11:11 AM

## 2021-02-08 NOTE — ED Notes (Signed)
Transport requested via Scientist, research (physical sciences).

## 2021-02-08 NOTE — ED Notes (Signed)
Transport on floor to take patient to room.

## 2021-02-08 NOTE — ED Notes (Signed)
Representative for [facility] called & was updated about pt status.
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
## 2021-02-08 NOTE — ED Notes (Signed)
Nonadherent dressing applied to RLE wound and secured with Kerlix

## 2021-02-08 NOTE — Plan of Care (Signed)
  Problem: Health Behavior/Discharge Planning: Goal: Ability to manage health-related needs will improve Outcome: Progressing   Problem: Clinical Measurements: Goal: Ability to maintain clinical measurements within normal limits will improve Outcome: Progressing Goal: Diagnostic test results will improve Outcome: Progressing   

## 2021-02-08 NOTE — ED Notes (Signed)
Pt's DSS guardian, Barnie Alderman [phone] came by to check on pt while he was sleeping.
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
## 2021-02-08 NOTE — Consult Note (Signed)
[facility] Nurse Consult Note: Patient receiving care in [facility] ED 39. Reason for Consult: LE wounds Wound type: venous stasis changes to BLE, with wound on RLE Pressure Injury POA: Yes/No/NA Measurement: see photos of BLE. Patient seen by Orlan Leavens at [facility] on 02/07/21.  His note provides measurements of RLE wound. Wound bed: see photo Drainage (amount, consistency, odor) serous Periwound: erythematous Dressing procedure/placement/frequency:  Wash RLE with soap and water. Apply Silvadene over wound. Cover with Telfa pad and ABD. Apply Sween Moisturizing ointment (pink and white tube in clean utility) to intact skin of leg. Beginning behind the toes and going to just below the knees, spiral wrap kerlex, then 4 inch ace wrap. Perform daily.  For the LLE, washing, drying of leg and application of Sween Moisturizing ointment, then unna boot has been ordered.  [facility] nurse will not follow at this time.  Please re-consult the [place] team if needed.  Val Riles, RN, MSN, CWOCN, CNS-BC, pager [phone]

## 2021-02-08 NOTE — ED Notes (Signed)
Patient transported to X-ray 

## 2021-02-08 NOTE — ED Notes (Signed)
Patient provided bagged meal and PO fluids. Patient feeding self without issues at this time.

## 2021-02-09 ENCOUNTER — Ambulatory Visit: Payer: Medicare (Managed Care)

## 2021-02-09 DIAGNOSIS — F1010 Alcohol abuse, uncomplicated: Secondary | ICD-10-CM

## 2021-02-09 DIAGNOSIS — L97818 Non-pressure chronic ulcer of other part of right lower leg with other specified severity: Secondary | ICD-10-CM | POA: Diagnosis present

## 2021-02-09 DIAGNOSIS — M86161 Other acute osteomyelitis, right tibia and fibula: Secondary | ICD-10-CM | POA: Diagnosis not present

## 2021-02-09 DIAGNOSIS — D649 Anemia, unspecified: Secondary | ICD-10-CM | POA: Diagnosis present

## 2021-02-09 DIAGNOSIS — I10 Essential (primary) hypertension: Secondary | ICD-10-CM | POA: Diagnosis present

## 2021-02-09 DIAGNOSIS — I87332 Chronic venous hypertension (idiopathic) with ulcer and inflammation of left lower extremity: Secondary | ICD-10-CM

## 2021-02-09 DIAGNOSIS — Z833 Family history of diabetes mellitus: Secondary | ICD-10-CM | POA: Diagnosis not present

## 2021-02-09 DIAGNOSIS — J45909 Unspecified asthma, uncomplicated: Secondary | ICD-10-CM | POA: Diagnosis present

## 2021-02-09 DIAGNOSIS — J42 Unspecified chronic bronchitis: Secondary | ICD-10-CM | POA: Diagnosis present

## 2021-02-09 DIAGNOSIS — I872 Venous insufficiency (chronic) (peripheral): Secondary | ICD-10-CM | POA: Diagnosis present

## 2021-02-09 DIAGNOSIS — L0390 Cellulitis, unspecified: Secondary | ICD-10-CM | POA: Diagnosis present

## 2021-02-09 DIAGNOSIS — Z79899 Other long term (current) drug therapy: Secondary | ICD-10-CM | POA: Diagnosis not present

## 2021-02-09 DIAGNOSIS — L03115 Cellulitis of right lower limb: Secondary | ICD-10-CM | POA: Diagnosis present

## 2021-02-09 DIAGNOSIS — E785 Hyperlipidemia, unspecified: Secondary | ICD-10-CM | POA: Diagnosis present

## 2021-02-09 DIAGNOSIS — Z20822 Contact with and (suspected) exposure to covid-19: Secondary | ICD-10-CM | POA: Diagnosis present

## 2021-02-09 DIAGNOSIS — Z87820 Personal history of traumatic brain injury: Secondary | ICD-10-CM | POA: Diagnosis not present

## 2021-02-09 DIAGNOSIS — L03116 Cellulitis of left lower limb: Secondary | ICD-10-CM | POA: Diagnosis present

## 2021-02-09 DIAGNOSIS — Z7951 Long term (current) use of inhaled steroids: Secondary | ICD-10-CM | POA: Diagnosis not present

## 2021-02-09 DIAGNOSIS — K219 Gastro-esophageal reflux disease without esophagitis: Secondary | ICD-10-CM | POA: Diagnosis present

## 2021-02-09 LAB — BASIC METABOLIC PANEL
Anion gap: 4 — ABNORMAL LOW (ref 5–15)
BUN: 12 mg/dL (ref 8–23)
CO2: 24 mmol/L (ref 22–32)
Calcium: 8.1 mg/dL — ABNORMAL LOW (ref 8.9–10.3)
Chloride: 108 mmol/L (ref 98–111)
Creatinine, Ser: 0.76 mg/dL (ref 0.61–1.24)
GFR, Estimated: >60 mL/min (ref >60)
Glucose, Bld: 89 mg/dL (ref 70–99)
Potassium: 3.6 mmol/L (ref 3.5–5.1)
Sodium: 136 mmol/L (ref 135–145)

## 2021-02-09 LAB — CBC
HCT: 29.5 % — ABNORMAL LOW (ref 39.0–52.0)
Hemoglobin: 9.6 g/dL — ABNORMAL LOW (ref 13.0–17.0)
MCH: 30.3 pg (ref 26.0–34.0)
MCHC: 32.5 g/dL (ref 30.0–36.0)
MCV: 93.1 fL (ref 80.0–100.0)
Platelets: 140 10*3/uL — ABNORMAL LOW (ref 150–400)
RBC: 3.17 MIL/uL — ABNORMAL LOW (ref 4.22–5.81)
RDW: 14.7 % (ref 11.5–15.5)
WBC: 4 10*3/uL (ref 4.0–10.5)
nRBC: 0 % (ref 0.0–0.2)

## 2021-02-09 MED ORDER — LOSARTAN POTASSIUM 50 MG PO TABS
25.0000 mg | ORAL_TABLET | Freq: Every day | ORAL | Status: DC
  Administered 2021-02-09 – 2021-02-11 (×3): 25 mg via ORAL
  Filled 2021-02-09 (×3): qty 1
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
## 2021-02-09 NOTE — TOC Progression Note (Signed)
Transition of Care [facility]) - Progression Note    Patient Details  Name: [name] MRN: [id number] Date of Birth: 1951-05-12  Transition of Care [facility]) CM/SW [place], [place] Phone Number: 02/09/2021, 4:56 PM  Clinical Narrative:     TOC following for SNF placement - Patient needs PT/OT recommendations SNF placement.   CSW informed RN patient needs to be seen by PT/OT.   TOC will continue to follow and assist with discharge planning.     Expected Discharge Plan: [place]    Expected Discharge Plan and Services Expected Discharge Plan: [place]   Discharge Planning Services: CM Consult   Living arrangements for the past 2 months: Single Family Home                                       Social Determinants of Health (SDOH) Interventions    Readmission Risk Interventions No flowsheet data found.

## 2021-02-09 NOTE — Progress Notes (Signed)
Orthopedic Tech Progress Note Patient Details:  [name] [birth year] [id number]  Ortho Devices Type of Ortho Device: Louretta Parma boot Ortho Device/Splint Location: LLE Ortho Device/Splint Interventions: Ordered,Application   Post Interventions Patient Tolerated: Well Instructions Provided: Care of device   Janit Pagan 02/09/2021, 12:26 PM

## 2021-02-10 LAB — CBC
HCT: 29.2 % — ABNORMAL LOW (ref 39.0–52.0)
Hemoglobin: 9.7 g/dL — ABNORMAL LOW (ref 13.0–17.0)
MCH: 30.3 pg (ref 26.0–34.0)
MCHC: 33.2 g/dL (ref 30.0–36.0)
MCV: 91.3 fL (ref 80.0–100.0)
Platelets: 154 10*3/uL (ref 150–400)
RBC: 3.2 MIL/uL — ABNORMAL LOW (ref 4.22–5.81)
RDW: 14.6 % (ref 11.5–15.5)
WBC: 4.1 10*3/uL (ref 4.0–10.5)
nRBC: 0 % (ref 0.0–0.2)

## 2021-02-10 LAB — BASIC METABOLIC PANEL
Anion gap: 5 (ref 5–15)
BUN: 13 mg/dL (ref 8–23)
CO2: 22 mmol/L (ref 22–32)
Calcium: 8.3 mg/dL — ABNORMAL LOW (ref 8.9–10.3)
Chloride: 109 mmol/L (ref 98–111)
Creatinine, Ser: 0.76 mg/dL (ref 0.61–1.24)
GFR, Estimated: >60 mL/min (ref >60)
Glucose, Bld: 94 mg/dL (ref 70–99)
Potassium: 3.9 mmol/L (ref 3.5–5.1)
Sodium: 136 mmol/L (ref 135–145)

## 2021-02-10 MED ORDER — LIDOCAINE HCL (PF) 2 % IJ SOLN: 0.0000 mL | Freq: Once | INTRAMUSCULAR | Status: DC | PRN

## 2021-02-10 MED ORDER — LIDOCAINE HCL (CARDIAC) PF 100 MG/5ML IV SOSY
PREFILLED_SYRINGE | INTRAVENOUS | Status: AC
  Filled 2021-02-10: qty 5

## 2021-02-10 NOTE — TOC Progression Note (Signed)
Transition of Care [facility]) - Progression Note    Patient Details  Name: [name] MRN: [id number] Date of Birth: 01/02/1951  Transition of Care [facility]) CM/SW Contact  Wandra Feinstein [place], [place] Phone Number: 02/10/2021, 4:19 PM  Clinical Narrative:   Provided current SNF offers  pt's DSS guardian Barnie Alderman [phone] and he has accepted [facility]. Guardian to update pt re plan. Spoke to [place] with [facility] [phone] who confirmed they will approve SNF for continued wound care and PT/OT. Nikki at [facility] updated and reports bed available tomorrow once guardian has completed admission ppw. MD updated. SW will assist as indicated.   Wandra Feinstein, MSW, LCSW [phone] (coverage)       Expected Discharge Plan: [place]    Expected Discharge Plan and Services Expected Discharge Plan: [place]   Discharge Planning Services: CM Consult   Living arrangements for the past 2 months: Single Family Home                                       Social Determinants of Health (SDOH) Interventions    Readmission Risk Interventions No flowsheet data found.

## 2021-02-10 NOTE — Plan of Care (Signed)
  Problem: Clinical Measurements: Goal: Will remain free from infection Outcome: Progressing Goal: Diagnostic test results will improve Outcome: Progressing   Problem: Nutrition: Goal: Adequate nutrition will be maintained Outcome: Progressing   

## 2021-02-10 NOTE — Plan of Care (Signed)
Patient is very pleasant. Discharge plan is to [facility] tomorrow for wound healing and PT/OT. Dsg to right lower extremity clean dry and intact - was changed already on day shift so no need to change again tonight, order is daily. Patient is in no apparent distress or needs voiced at this time. Will continue to monitor and continue current POC.

## 2021-02-10 NOTE — NC FL2 (Signed)
[place] LEVEL OF CARE SCREENING TOOL     IDENTIFICATION  Patient Name: [name] Birthdate: 1951/06/16 Sex: male Admission Date (Current Location): 02/07/2021  [facility] and [place] Number:  Herbalist and Address:  [facility] [place], [place] [address] [place], [place] [address]      Provider Number: [id number]  Attending Physician Name and Address:  Angelica Pou, MD  Relative Name and Phone Number:  Barnie Alderman (legal Guardian) [phone]    Current Level of Care: Hospital Recommended Level of Care: [place] Prior Approval Number:    Date Approved/Denied:   PASRR Number: [id number]  Discharge Plan: SNF    Current Diagnoses: Patient Active Problem List   Diagnosis Date Noted  . Cellulitis 02/08/2021  . Right Lower Extremity Wound infection 12/01/2020  . Pressure injury of skin 12/01/2020  . Varicose veins of right lower extremity with complications 36/62/9476  . Dyspnea 05/13/2015  . TBI (traumatic brain injury) ([place]) 10/08/2014  . Mental retardation 10/08/2014  . HTN (hypertension) 10/08/2014    Orientation RESPIRATION BLADDER Height & Weight     Self,Time,Situation,Place  Normal Continent Weight: 171 lb 11.8 oz (77.9 kg) Height:  5\' 3"  (160 cm)  BEHAVIORAL SYMPTOMS/MOOD NEUROLOGICAL BOWEL NUTRITION STATUS      Continent    AMBULATORY STATUS COMMUNICATION OF NEEDS Skin   Limited Assist Verbally Other (Comment) (SEE WOC NOTES)                       Personal Care Assistance Level of Assistance  Bathing,Dressing Bathing Assistance: Limited assistance   Dressing Assistance: Limited assistance     Functional Limitations Info             SPECIAL CARE FACTORS FREQUENCY  PT (By licensed PT),OT (By licensed OT)                    Contractures Contractures Info: Not present    Additional Factors Info  Code Status Code Status Info: FULL CODE             Current  Medications (02/10/2021):  This is the current hospital active medication list Current Facility-Administered Medications  Medication Dose Route Frequency Provider Last Rate Last Admin  . acetaminophen (TYLENOL) tablet 650 mg  650 mg Oral Q6H PRN Asencion Noble, MD   650 mg at 02/09/21 1006   Or  . acetaminophen (TYLENOL) suppository 650 mg  650 mg Rectal Q6H PRN Asencion Noble, MD      . albuterol (VENTOLIN HFA) 108 (90 Base) MCG/ACT inhaler 1 puff  1 puff Inhalation Q4H PRN Sherry Ruffing, Marissa M, MD      . atorvastatin (LIPITOR) tablet 20 mg  20 mg Oral Daily Asencion Noble, MD   20 mg at 02/10/21 5465  . ceFEPIme (MAXIPIME) 2 g in sodium chloride 0.9 % 100 mL IVPB  2 g Intravenous Q8H Asencion Noble, MD 200 mL/hr at 02/10/21 1346 2 g at 02/10/21 1346  . enoxaparin (LOVENOX) injection 40 mg  40 mg Subcutaneous Q24H Asencion Noble, MD   40 mg at 02/09/21 1851  . fluticasone (FLONASE) 50 MCG/ACT nasal spray 1 spray  1 spray Each Nare Daily Asencion Noble, MD   1 spray at 02/10/21 [phone]  . hydrocortisone cream 1 % 1 application  1 application Topical Daily Asencion Noble, MD   1 application at 65/68/12 0910  . lidocaine  HCl (PF) (XYLOCAINE) 2 % injection 0-20 mL  0-20 mL Intradermal Once PRN Cato Mulligan, MD      . loratadine (CLARITIN) tablet 10 mg  10 mg Oral Daily Asencion Noble, MD   10 mg at 02/10/21 0902  . losartan (COZAAR) tablet 25 mg  25 mg Oral Daily Cato Mulligan, MD   25 mg at 02/10/21 0901  . mometasone-formoterol (DULERA) 200-5 MCG/ACT inhaler 2 puff  2 puff Inhalation BID Asencion Noble, MD   2 puff at 02/10/21 0754  . tamsulosin (FLOMAX) capsule 0.4 mg  0.4 mg Oral Daily Lonia Skinner M, MD   0.4 mg at 02/10/21 4707  . triamcinolone (KENALOG) 0.1 % cream   Topical BID Asencion Noble, MD   Given at 02/10/21 [phone]  . vancomycin (VANCOREADY) IVPB 750 mg/150 mL  750 mg Intravenous Q12H Bertis Ruddy, RPH 150 mL/hr at 02/10/21 0555 750 mg at  02/10/21 8343     Discharge Medications: Please see discharge summary for a list of discharge medications.  Relevant Imaging Results:  Relevant Lab Results:   Additional Information SS# [id number]  Amador Cunas, [place]
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
## 2021-02-11 ENCOUNTER — Ambulatory Visit: Payer: Medicare (Managed Care)

## 2021-02-11 DIAGNOSIS — L03116 Cellulitis of left lower limb: Secondary | ICD-10-CM

## 2021-02-11 LAB — AEROBIC CULTURE W GRAM STAIN (SUPERFICIAL SPECIMEN): Gram Stain: NONE SEEN

## 2021-02-11 MED ORDER — SULFAMETHOXAZOLE-TRIMETHOPRIM 800-160 MG PO TABS
1.0000 | ORAL_TABLET | Freq: Two times a day (BID) | ORAL | Status: DC
  Administered 2021-02-11: 1 via ORAL
  Filled 2021-02-11: qty 1

## 2021-02-11 MED ORDER — SULFAMETHOXAZOLE-TRIMETHOPRIM 800-160 MG PO TABS: 1.0000 | ORAL_TABLET | Freq: Two times a day (BID) | ORAL | 0 refills | Status: DC

## 2021-02-11 NOTE — Discharge Summary (Signed)
Name: [name] MRN: [id number] DOB: 14-Jan-1951 70 y.o. PCP: Janifer Adie, MD  Date of Admission: 02/07/2021  4:33 PM Date of Discharge:  Attending Physician: Angelica Pou, MD  Discharge Diagnosis: 1. Active Problems:   Cellulitis  Discharge Medications: Allergies as of 02/11/2021   No Known Allergies     Medication List    STOP taking these medications   losartan 50 MG tablet Commonly known as: COZAAR   silver sulfADIAZINE 1 % cream Commonly known as: SILVADENE     TAKE these medications   acetaminophen 500 MG tablet Commonly known as: TYLENOL Take 500 mg by mouth every 8 (eight) hours as needed for headache or mild pain. What changed: Another medication with the same name was removed. Continue taking this medication, and follow the directions you see here.   albuterol 108 (90 Base) MCG/ACT inhaler Commonly known as: VENTOLIN HFA Inhale 2 puffs into the lungs every 4 (four) hours as needed for wheezing. What changed: how much to take   Alpha-Lipoic Acid 600 MG Caps Take 600 mg by mouth in the morning and at bedtime.   atorvastatin 20 MG tablet Commonly known as: LIPITOR Take 20 mg by mouth daily.   betamethasone valerate 0.1 % cream Commonly known as: VALISONE Apply 1 application topically 2 (two) times daily. For scalp   BIOFREEZE ROLL-ON EX Apply 1 application topically 4 (four) times daily as needed (joint pain).   budesonide-formoterol 160-4.5 MCG/ACT inhaler Commonly known as: SYMBICORT Inhale 2 puffs into the lungs 2 (two) times daily.   chlorhexidine 0.12 % solution Commonly known as: PERIDEX Use as directed 15 mLs in the mouth or throat 2 (two) times daily.   fexofenadine 180 MG tablet Commonly known as: ALLEGRA Take 180 mg by mouth daily.   fluticasone 50 MCG/ACT nasal spray Commonly known as: FLONASE Place 1 spray into both nostrils in the morning and at bedtime.   hydrocortisone 2.5 % cream Apply 1 application  topically daily. To Face   ketoconazole 2 % shampoo Commonly known as: NIZORAL Apply 1 application topically See admin instructions. Wash scalp,beard,mustache area 2-3 times a week   MELOXICAM PO Take 7.5 mg by mouth daily as needed (pain).   miconazole 2 % powder Commonly known as: MICOTIN Apply 1 application topically daily. For feet   nystatin cream Commonly known as: MYCOSTATIN Apply 1 application topically 3 (three) times daily. For groin area   polyethylene glycol 17 g packet Commonly known as: MIRALAX / GLYCOLAX Take 17 g by mouth 3 (three) times a week. Mon, Wed, Fri   PreviDent 5000 Booster Plus 1.1 % Pste Generic drug: Sodium Fluoride Place 1 application onto teeth at bedtime.   simethicone 80 MG chewable tablet Commonly known as: MYLICON Chew 80 mg by mouth 4 (four) times daily as needed for flatulence. After meals and at bedtime as needed for gas   sulfamethoxazole-trimethoprim 800-160 MG tablet Commonly known as: BACTRIM DS Take 1 tablet by mouth every 12 (twelve) hours.   tamsulosin 0.4 MG Caps capsule Commonly known as: FLOMAX Take 0.4 mg by mouth daily.   Vitamin D (Ergocalciferol) 1.25 MG (50000 UNIT) Caps capsule Commonly known as: DRISDOL Take 50,000 Units by mouth every 30 (thirty) days.            Discharge Care Instructions  (From admission, onward)         Start     Ordered   02/11/21 0000  Discharge wound care:  Comments: Comments: Wash RLE with soap and water. Apply Silvadene over wound. Cover with Telfa pad and ABD. Apply Sween Moisturizing ointment (pink and white tube in clean utility) to intact skin of leg. Beginning behind the toes and going to just below the knees, spiral wrap kerlex, then 4 inch ace wrap. Perform daily.  For the LLE, washing, drying of leg and application of Sween Moisturizing ointment, then unna boot has been ordered.   02/11/21 1122         Disposition and follow-up:   Mr.Vaun C Drab was  discharged from [facility] in Stable condition.  At the hospital follow up visit please address:  1.  Right lower extremity full thickness ulceration with surrounding cellulitis: Patient received wound care during hospitalization with punch biopsy of margin of wound and IV antibiotics for four days. Patient discharged with six additional days of trimethoprim-sulfamethoxazole 800-160mg  twice daily. Please continue with wound care and follow-up surgical pathology results. Continue antibiotic regimen to complete 10-day course of treatment.  2.  Labs / imaging needed at time of follow-up: CBC, BMP  3.  Pending labs/ test needing follow-up: Surgical pathology collected 03/24  Follow-up Appointments:  Contact information for after-discharge care    Destination    [facility] Preferred SNF .   Service: Skilled Nursing Contact information: [address] [place] Hospital Course by problem list: 1. Right lower extremity full thickness ulceration with surrounding cellulitis: Patient admitted for management of chronic, non-healing right lower extremity wound with concern for surrounding cellulitis. Patient underwent laboratory workup and radiograph of right lower extremity with no notable findings. Patient received wound care during hospitalization with punch biopsy of margin of wound and IV antibiotics with cefepime and vancomycin for four days. Per susceptibility results of wound culture, patient transitioned to oral antibiotic regimen and discharged with six additional days of trimethoprim-sulfamethoxazole 800-160mg  twice daily. Patient discharged to [facility] facility based on our team, PT and OT recommendations for further rehabilitation and continued wound care.  2. Lower extremity chronic venous stasis dermatitis: Patient received Unna boot for his left lower extremity during this  hospitalization.  3. Hypertension: patient noted to be well below goal blood pressure with titration down of his losartan from 50mg  to 25mg  then discontinued on day of discharge.   Pertinent Labs, Studies, and Procedures:  CBC Latest Ref Rng & Units 02/10/2021 02/09/2021 02/07/2021  WBC 4.0 - 10.5 K/uL 4.1 4.0 4.4  Hemoglobin 13.0 - 17.0 g/dL 9.7(L) 9.6(L) 9.8(L)  Hematocrit 39.0 - 52.0 % 29.2(L) 29.5(L) 31.5(L)  Platelets 150 - 400 K/uL 154 140(L) 180   CMP Latest Ref Rng & Units 02/10/2021 02/09/2021 02/07/2021  Glucose 70 - 99 mg/dL 94 89 99  BUN 8 - 23 mg/dL 13 12 14   Creatinine 0.61 - 1.24 mg/dL 0.76 0.76 0.99  Sodium 135 - 145 mmol/L 136 136 139  Potassium 3.5 - 5.1 mmol/L 3.9 3.6 3.6  Chloride 98 - 111 mmol/L 109 108 106  CO2 22 - 32 mmol/L 22 24 26   Calcium 8.9 - 10.3 mg/dL 8.3(L) 8.1(L) 9.0  Total Protein 6.5 - 8.1 g/dL - - 6.2(L)  Total Bilirubin 0.3 - 1.2 mg/dL - - 0.5  Alkaline Phos 38 - 126 U/L - - 46  AST 15 - 41 U/L - - 19  ALT 0 - 44 U/L - -  14  DG Tibia/Fibula Right  Result Date: 02/08/2021 CLINICAL DATA:  Distal wound infection EXAM: RIGHT TIBIA AND FIBULA - 2 VIEW COMPARISON:  November 30, 2020 FINDINGS: Frontal and lateral views were obtained. No fracture or dislocation. No abnormal periosteal reaction. No erosive change or bony destruction. No appreciable joint space narrowing. There is an inferior calcaneal spur. IMPRESSION: No fracture or dislocation. No bony destruction or periostitis. No appreciable joint space narrowing. There is an inferior calcaneal spur. Electronically Signed   By: Lowella Grip III M.D.   On: 02/08/2021 14:59   Discharge Instructions: Discharge Instructions    Call MD for:  difficulty breathing, headache or visual disturbances   Complete by: As directed    Call MD for:  extreme fatigue   Complete by: As directed    Call MD for:  persistant dizziness or light-headedness   Complete by: As directed    Call MD for:  persistant nausea and  vomiting   Complete by: As directed    Call MD for:  redness, tenderness, or signs of infection (pain, swelling, redness, odor or green/yellow discharge around incision site)   Complete by: As directed    Call MD for:  severe uncontrolled pain   Complete by: As directed    Call MD for:  temperature >100.4   Complete by: As directed    Diet - low sodium heart healthy   Complete by: As directed    Discharge wound care:   Complete by: As directed    Comments: Wash RLE with soap and water. Apply Silvadene over wound. Cover with Telfa pad and ABD. Apply Sween Moisturizing ointment (pink and white tube in clean utility) to intact skin of leg. Beginning behind the toes and going to just below the knees, spiral wrap kerlex, then 4 inch ace wrap. Perform daily.  For the LLE, washing, drying of leg and application of Sween Moisturizing ointment, then unna boot has been ordered.   Increase activity slowly   Complete by: As directed      Signed: Cato Mulligan, MD 02/11/2021, 11:22 AM   Pager: [phone]

## 2021-02-11 NOTE — Progress Notes (Signed)
Discharge paperwork in chart for [facility]. Pt not in distress and tolerated well.
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
## 2021-02-11 NOTE — TOC Initial Note (Signed)
Transition of Care [facility]) - Initial/Assessment Note    Patient Details  Name: [name] MRN: [id number] Date of Birth: [birth year]  Transition of Care [facility] CM/SW Contact:    Coralee Pesa, [place] Phone Number: 02/11/2021, 12:29 PM  Clinical Narrative:                 [facility] Nurse to call report to [phone] Expected Discharge Plan: [place] Barriers to Discharge: Barriers Resolved   Patient Goals and CMS Choice        Expected Discharge Plan and Services Expected Discharge Plan: [place]   Discharge Planning Services: CM Consult   Living arrangements for the past 2 months: Single Family Home Expected Discharge Date: 02/11/21                                    Prior Living Arrangements/Services Living arrangements for the past 2 months: Single Family Home                     Activities of Daily Living Home Assistive Devices/Equipment: Cane (specify quad or straight) ADL Screening (condition at time of admission) Patient's cognitive ability adequate to safely complete daily activities?: Yes Is the patient deaf or have difficulty hearing?: No Does the patient have difficulty seeing, even when wearing glasses/contacts?: No Does the patient have difficulty concentrating, remembering, or making decisions?: No Patient able to express need for assistance with ADLs?: Yes Does the patient have difficulty dressing or bathing?: No Independently performs ADLs?: Yes (appropriate for developmental age) Does the patient have difficulty walking or climbing stairs?: Yes Weakness of Legs: None Weakness of Arms/Hands: None  Permission Sought/Granted                  Emotional Assessment              Admission diagnosis:  Cellulitis [L03.90] Osteomyelitis ([place]) [M86.9] Cellulitis of left lower extremity [L03.116] Cellulitis of right lower extremity [L03.115] Patient  Active Problem List   Diagnosis Date Noted  . Cellulitis 02/08/2021  . Right Lower Extremity Wound infection 12/01/2020  . Pressure injury of skin 12/01/2020  . Varicose veins of right lower extremity with complications 83/29/1916  . Dyspnea 05/13/2015  . TBI (traumatic brain injury) ([place]) 10/08/2014  . Mental retardation 10/08/2014  . HTN (hypertension) 10/08/2014   PCP:  Janifer Adie, MD Pharmacy:   [place], [place] - [address] [place] [address] Phone: [phone] Fax: [phone]  [facility] - [place], [place] - [address] [place] [address] Phone: [phone] Fax: [phone]     Social Determinants of Health (SDOH) Interventions    Readmission Risk Interventions No flowsheet data found.

## 2021-02-14 LAB — SURGICAL PATHOLOGY

## 2021-07-19 ENCOUNTER — Encounter: Payer: Medicare (Managed Care) | Attending: Physician Assistant | Admitting: Physician Assistant

## 2021-07-19 ENCOUNTER — Other Ambulatory Visit: Payer: Self-pay

## 2021-07-19 ENCOUNTER — Other Ambulatory Visit: Payer: Self-pay | Admitting: Physician Assistant

## 2021-07-19 DIAGNOSIS — I872 Venous insufficiency (chronic) (peripheral): Secondary | ICD-10-CM | POA: Insufficient documentation

## 2021-07-19 DIAGNOSIS — L97312 Non-pressure chronic ulcer of right ankle with fat layer exposed: Secondary | ICD-10-CM | POA: Diagnosis not present

## 2021-07-19 DIAGNOSIS — F79 Unspecified intellectual disabilities: Secondary | ICD-10-CM | POA: Diagnosis not present

## 2021-07-19 DIAGNOSIS — Z87820 Personal history of traumatic brain injury: Secondary | ICD-10-CM | POA: Diagnosis not present

## 2021-07-20 NOTE — Progress Notes (Signed)
PERL, KLAUSER ([id number]) Visit Report for 07/19/2021 Allergy List Details Patient Name: [name], [name] Date of Service: 07/19/2021 12:45 PM Medical Record Number: [id number] Patient Account Number: [id number] Date of Birth/Sex: [birth year] (70 y.o. M) Treating RN: Dolan Amen Primary Care Justun Anaya: Barney Drain Other Clinician: Referring Rylin Saez: Barney Drain Treating Nature Vogelsang/Extender: Jeri Cos Weeks in Treatment: 0 Allergies Active Allergies No Known Drug Allergies Allergy Notes Electronic Signature(s) Signed: 07/19/2021 4:37:36 PM By: Dolan Amen RN Entered By: Dolan Amen on 07/19/2021 13:06:00 [name] ([id number]) -------------------------------------------------------------------------------- Arrival Information Details Patient Name: [name] Date of Service: 07/19/2021 12:45 PM Medical Record Number: [id number] Patient Account Number: [id number] Date of Birth/Sex: [birth year] (70 y.o. M) Treating RN: Dolan Amen Primary Care Darlen Gledhill: Barney Drain Other Clinician: Referring Anayi Bricco: Barney Drain Treating Briar Sword/Extender: Skipper Cliche in Treatment: 0 Visit Information Patient Arrived: Cane Arrival Time: 13:04 Accompanied By: self Transfer Assistance: None Patient Identification Verified: Yes Secondary Verification Process Completed: Yes History Since Last Visit Electronic Signature(s) Signed: 07/19/2021 4:37:36 PM By: Dolan Amen RN Entered By: Dolan Amen on 07/19/2021 13:05:08 [name] ([id number]) -------------------------------------------------------------------------------- Clinic Level of Care Assessment Details Patient Name: [name] Date of Service: 07/19/2021 12:45 PM Medical Record Number: [id number] Patient Account Number: [id number] Date of Birth/Sex: August 04, 1951 (70 y.o. M) Treating RN: Dolan Amen Primary Care Evaan Tidwell: Barney Drain Other Clinician: Referring  Ayaz Sondgeroth: Barney Drain Treating Dorian Duval/Extender: Skipper Cliche in Treatment: 0 Clinic Level of Care Assessment Items TOOL 1 Quantity Score X - Use when EandM and Procedure is performed on INITIAL visit 1 0 ASSESSMENTS - Nursing Assessment / Reassessment X - General Physical Exam (combine w/ comprehensive assessment (listed just below) when performed on new 1 20 pt. evals) X- 1 25 Comprehensive Assessment (HX, ROS, Risk Assessments, Wounds Hx, etc.) ASSESSMENTS - Wound and Skin Assessment / Reassessment X - Dermatologic / Skin Assessment (not related to wound area) 1 10 ASSESSMENTS - Ostomy and/or Continence Assessment and Care '[]'$  - Incontinence Assessment and Management 0 '[]'$  - 0 Ostomy Care Assessment and Management (repouching, etc.) PROCESS - Coordination of Care X - Simple Patient / Family Education for ongoing care 1 15 '[]'$  - 0 Complex (extensive) Patient / Family Education for ongoing care X- 1 10 Staff obtains Programmer, systems, Records, Test Results / Process Orders '[]'$  - 0 Staff telephones HHA, Nursing Homes / Clarify orders / etc '[]'$  - 0 Routine Transfer to another Facility (non-emergent condition) '[]'$  - 0 Routine Hospital Admission (non-emergent condition) X- 1 15 New Admissions / Biomedical engineer / Ordering NPWT, Apligraf, etc. '[]'$  - 0 Emergency Hospital Admission (emergent condition) PROCESS - Special Needs '[]'$  - Pediatric / Minor Patient Management 0 '[]'$  - 0 Isolation Patient Management '[]'$  - 0 Hearing / Language / Visual special needs '[]'$  - 0 Assessment of Community assistance (transportation, D/C planning, etc.) '[]'$  - 0 Additional assistance / Altered mentation '[]'$  - 0 Support Surface(s) Assessment (bed, cushion, seat, etc.) INTERVENTIONS - Miscellaneous '[]'$  - External ear exam 0 '[]'$  - 0 Patient Transfer (multiple staff / Civil Service fast streamer / Similar devices) '[]'$  - 0 Simple Staple / Suture removal (25 or less) '[]'$  - 0 Complex Staple / Suture removal (26 or  more) '[]'$  - 0 Hypo/Hyperglycemic Management (do not check if billed separately) '[]'$  - 0 Ankle / Brachial Index (ABI) - do not check if billed separately Has the patient been seen at the hospital within the last three years: Yes Total Score: 95 Level Of  Care: New/Established - Level 3 [name], [name] ([id number]) Electronic Signature(s) Signed: 07/19/2021 4:37:36 PM By: Dolan Amen RN Entered By: Dolan Amen on 07/19/2021 13:59:07 [name] ([id number]) -------------------------------------------------------------------------------- Compression Therapy Details Patient Name: [name] Date of Service: 07/19/2021 12:45 PM Medical Record Number: [id number] Patient Account Number: [id number] Date of Birth/Sex: 09/09/1951 (70 y.o. M) Treating RN: Dolan Amen Primary Care Phillippa Straub: Barney Drain Other Clinician: Referring Elan Brainerd: Barney Drain Treating Randal Yepiz/Extender: Skipper Cliche in Treatment: 0 Compression Therapy Performed for Wound Assessment: Wound #2 Right,Medial Ankle Performed By: Cora Daniels, RN Compression Type: Three Layer Post Procedure Diagnosis Same as Pre-procedure Electronic Signature(s) Signed: 07/19/2021 4:37:36 PM By: Dolan Amen RN Entered By: Dolan Amen on 07/19/2021 13:47:01 [name] ([id number]) -------------------------------------------------------------------------------- Encounter Discharge Information Details Patient Name: [name] Date of Service: 07/19/2021 12:45 PM Medical Record Number: [id number] Patient Account Number: [id number] Date of Birth/Sex: 05/05/1951 (70 y.o. M) Treating RN: Dolan Amen Primary Care Albirta Rhinehart: Barney Drain Other Clinician: Referring Johnta Couts: Barney Drain Treating Cindel Daugherty/Extender: Skipper Cliche in Treatment: 0 Encounter Discharge Information Items Post Procedure Vitals Discharge Condition: Stable Temperature (F): 98.6 Ambulatory  Status: Cane Pulse (bpm): 79 Discharge Destination: Home Respiratory Rate (breaths/min): 18 Transportation: Private Auto Blood Pressure (mmHg): 105/59 Accompanied By: self Schedule Follow-up Appointment: Yes Clinical Summary of Care: Electronic Signature(s) Signed: 07/19/2021 4:29:22 PM By: Dolan Amen RN Entered By: Dolan Amen on 07/19/2021 16:29:21 [name] ([id number]) -------------------------------------------------------------------------------- Lower Extremity Assessment Details Patient Name: [name] Date of Service: 07/19/2021 12:45 PM Medical Record Number: [id number] Patient Account Number: [id number] Date of Birth/Sex: 1951/09/14 (70 y.o. M) Treating RN: Dolan Amen Primary Care Yomayra Tate: Barney Drain Other Clinician: Referring Celestia Duva: Barney Drain Treating Caridad Silveira/Extender: Skipper Cliche in Treatment: 0 Edema Assessment Assessed: [Left: No] [Right: Yes] Edema: [Left: Ye] [Right: s] Calf Left: Right: Point of Measurement: 34 cm From Medial Instep 35.5 cm Ankle Left: Right: Point of Measurement: 12 cm From Medial Instep 27 cm Knee To Floor Left: Right: From Medial Instep 42 cm Vascular Assessment Pulses: Dorsalis Pedis Palpable: [Right:Yes] Electronic Signature(s) Signed: 07/19/2021 4:37:36 PM By: Dolan Amen RN Entered By: Dolan Amen on 07/19/2021 13:19:38 [name] ([id number]) -------------------------------------------------------------------------------- Multi Wound Chart Details Patient Name: [name] Date of Service: 07/19/2021 12:45 PM Medical Record Number: [id number] Patient Account Number: [id number] Date of Birth/Sex: 02/03/1951 (70 y.o. M) Treating RN: Dolan Amen Primary Care Ayliana Casciano: Barney Drain Other Clinician: Referring Iosefa Weintraub: Barney Drain Treating Lamichael Youkhana/Extender: Skipper Cliche in Treatment: 0 Vital Signs Height(in): Pulse(bpm): 54 Weight(lbs):  180 Blood Pressure(mmHg): 105/59 Body Mass Index(BMI): Temperature(F): 98.6 Respiratory Rate(breaths/min): 18 Photos: [N/A:N/A] Wound Location: Right, Medial Ankle N/A N/A Wounding Event: Gradually Appeared N/A N/A Primary Etiology: Venous Leg Ulcer N/A N/A Comorbid History: Hypertension, History of pressure N/A N/A wounds, Neuropathy Date Acquired: 10/20/2020 N/A N/A Weeks of Treatment: 0 N/A N/A Wound Status: Open N/A N/A Measurements L x W x D (cm) 4.7x4x0.2 N/A N/A Area (cm) : 14.765 N/A N/A Volume (cm) : 2.953 N/A N/A Classification: Full Thickness Without Exposed N/A N/A Support Structures Exudate Amount: Medium N/A N/A Exudate Type: Serosanguineous N/A N/A Exudate Color: red, brown N/A N/A Granulation Amount: Medium (34-66%) N/A N/A Granulation Quality: Red, Hyper-granulation N/A N/A Necrotic Amount: Medium (34-66%) N/A N/A Exposed Structures: Fat Layer (Subcutaneous Tissue): N/A N/A Yes Fascia: No Tendon: No Muscle: No Joint: No Bone: No Epithelialization: Small (1-33%) N/A N/A Treatment Notes  Electronic Signature(s) Signed: 07/19/2021 4:37:36 PM By: Dolan Amen RN Entered By: Dolan Amen on 07/19/2021 13:41:48 [name] ([id number]) -------------------------------------------------------------------------------- Multi-Disciplinary Care Plan Details Patient Name: [name] Date of Service: 07/19/2021 12:45 PM Medical Record Number: [id number] Patient Account Number: [id number] Date of Birth/Sex: 07/05/1951 (70 y.o. M) Treating RN: Dolan Amen Primary Care Adelei Scobey: Barney Drain Other Clinician: Referring Quinita Kostelecky: Barney Drain Treating Daissy Yerian/Extender: Skipper Cliche in Treatment: 0 Active Inactive Abuse / Safety / Falls / Self Care Management Nursing Diagnoses: Impaired physical mobility Potential for falls Goals: Patient will not develop complications from immobility Date Initiated: 07/19/2021 Target Resolution  Date: 08/19/2021 Goal Status: Active Patient will remain injury free related to falls Date Initiated: 07/19/2021 Target Resolution Date: 08/19/2021 Goal Status: Active Patient/caregiver will verbalize understanding of skin care regimen Date Initiated: 07/19/2021 Target Resolution Date: 07/19/2021 Goal Status: Active Interventions: Call light and/or bell within patient's reach Assess Activities of Daily Living upon admission and as needed Assess fall risk on admission and as needed Assess: immobility, friction, shearing, incontinence upon admission and as needed Assess impairment of mobility on admission and as needed per policy Assess personal safety and home safety (as indicated) on admission and as needed Assess self care needs on admission and as needed Provide education on basic hygiene Notes: Necrotic Tissue Nursing Diagnoses: Impaired tissue integrity related to necrotic/devitalized tissue Goals: Necrotic/devitalized tissue will be minimized in the wound bed Date Initiated: 07/19/2021 Target Resolution Date: 07/19/2021 Goal Status: Active Patient/caregiver will verbalize understanding of reason and process for debridement of necrotic tissue Date Initiated: 07/19/2021 Target Resolution Date: 07/19/2021 Goal Status: Active Interventions: Assess patient pain level pre-, during and post procedure and prior to discharge Provide education on necrotic tissue and debridement process Treatment Activities: Apply topical anesthetic as ordered : 07/19/2021 Biologic debridement : 07/19/2021 Enzymatic debridement : 07/19/2021 Excisional debridement : 07/19/2021 [name] ([id number]) Notes: Orientation to the Wound Care Program Nursing Diagnoses: Knowledge deficit related to the wound healing center program Goals: Patient/caregiver will verbalize understanding of the [facility] Date Initiated: 07/19/2021 Target Resolution Date: 07/19/2021 Goal Status:  Active Interventions: Provide education on orientation to the wound center Notes: Wound/Skin Impairment Nursing Diagnoses: Impaired tissue integrity Goals: Patient/caregiver will verbalize understanding of skin care regimen Date Initiated: 07/19/2021 Target Resolution Date: 07/19/2021 Goal Status: Active Ulcer/skin breakdown will have a volume reduction of 30% by week 4 Date Initiated: 07/19/2021 Target Resolution Date: 08/19/2021 Goal Status: Active Ulcer/skin breakdown will have a volume reduction of 50% by week 8 Date Initiated: 07/19/2021 Target Resolution Date: 09/18/2021 Goal Status: Active Ulcer/skin breakdown will have a volume reduction of 80% by week 12 Date Initiated: 07/19/2021 Target Resolution Date: 10/19/2021 Goal Status: Active Ulcer/skin breakdown will heal within 14 weeks Date Initiated: 07/19/2021 Target Resolution Date: 11/18/2021 Goal Status: Active Interventions: Assess patient/caregiver ability to obtain necessary supplies Assess patient/caregiver ability to perform ulcer/skin care regimen upon admission and as needed Assess ulceration(s) every visit Provide education on ulcer and skin care Treatment Activities: Skin care regimen initiated : 07/19/2021 Notes: Electronic Signature(s) Signed: 07/19/2021 4:37:36 PM By: Dolan Amen RN Entered By: Dolan Amen on 07/19/2021 13:41:35 [name] ([id number]) -------------------------------------------------------------------------------- Pain Assessment Details Patient Name: [name] Date of Service: 07/19/2021 12:45 PM Medical Record Number: [id number] Patient Account Number: [id number] Date of Birth/Sex: 1951-06-29 (70 y.o. M) Treating RN: Dolan Amen Primary Care Kerin Kren: Barney Drain Other Clinician: Referring Haden Cavenaugh: Barney Drain Treating Kemond Amorin/Extender: Skipper Cliche  in Treatment: 0 Active Problems Location of Pain Severity and Description of Pain Patient Has  Paino Yes Site Locations Rate the pain. Current Pain Level: 8 Pain Management and Medication Current Pain Management: Electronic Signature(s) Signed: 07/19/2021 4:37:36 PM By: Dolan Amen RN Entered By: Dolan Amen on 07/19/2021 13:05:15 [name] ([id number]) -------------------------------------------------------------------------------- Patient/Caregiver Education Details Patient Name: [name] Date of Service: 07/19/2021 12:45 PM Medical Record Number: [id number] Patient Account Number: [id number] Date of Birth/Gender: 1951-07-26 (70 y.o. M) Treating RN: Dolan Amen Primary Care Physician: Barney Drain Other Clinician: Referring Physician: Barney Drain Treating Physician/Extender: Skipper Cliche in Treatment: 0 Education Assessment Education Provided To: Patient Education Topics Provided Basic Hygiene: Methods: Explain/Verbal Responses: State content correctly Welcome To [facility]: Methods: Explain/Verbal Responses: State content correctly Wound/Skin Impairment: Methods: Explain/Verbal Responses: State content correctly Electronic Signature(s) Signed: 07/19/2021 4:37:36 PM By: Dolan Amen RN Entered By: Dolan Amen on 07/19/2021 13:59:38 [name] ([id number]) -------------------------------------------------------------------------------- Wound Assessment Details Patient Name: [name] Date of Service: 07/19/2021 12:45 PM Medical Record Number: [id number] Patient Account Number: [id number] Date of Birth/Sex: Apr 09, 1951 (70 y.o. M) Treating RN: Dolan Amen Primary Care Elfriede Bonini: Barney Drain Other Clinician: Referring Evely Gainey: Barney Drain Treating Lexxi Koslow/Extender: Skipper Cliche in Treatment: 0 Wound Status Wound Number: 2 Primary Etiology: Venous Leg Ulcer Wound Location: Right, Medial Ankle Wound Status: Open Wounding Event: Gradually Appeared Comorbid Hypertension, History  of pressure wounds, History: Neuropathy Date Acquired: 10/20/2020 Weeks Of Treatment: 0 Clustered Wound: No Photos Wound Measurements Length: (cm) 4.7 Width: (cm) 4 Depth: (cm) 0.2 Area: (cm) 14.765 Volume: (cm) 2.953 % Reduction in Area: % Reduction in Volume: Epithelialization: Small (1-33%) Tunneling: No Undermining: No Wound Description Classification: Full Thickness Without Exposed Support Structures Exudate Amount: Medium Exudate Type: Serosanguineous Exudate Color: red, brown Foul Odor After Cleansing: No Slough/Fibrino Yes Wound Bed Granulation Amount: Medium (34-66%) Exposed Structure Granulation Quality: Red, Hyper-granulation Fascia Exposed: No Necrotic Amount: Medium (34-66%) Fat Layer (Subcutaneous Tissue) Exposed: Yes Necrotic Quality: Adherent Slough Tendon Exposed: No Muscle Exposed: No Joint Exposed: No Bone Exposed: No Treatment Notes Wound #2 (Ankle) Wound Laterality: Right, Medial Cleanser Soap and Water Discharge Instruction: Gently cleanse wound with antibacterial soap, rinse and pat dry prior to dressing wounds Peri-Wound Care ISMAIL, BESTER ([id number]) Topical Primary Dressing Hydrofera Blue Ready Transfer Foam, 4x5 (in/in) Discharge Instruction: Apply Hydrofera Blue Ready to wound bed as directed Secondary Dressing ABD Pad 5x9 (in/in) Discharge Instruction: Cover with ABD pad Secured With Compression Wrap Profore Lite LF 3 Multilayer Compression Bandaging System Discharge Instruction: Apply 3 multi-layer wrap as prescribed. Compression Stockings Add-Ons Electronic Signature(s) Signed: 07/19/2021 4:37:36 PM By: Dolan Amen RN Entered By: Dolan Amen on 07/19/2021 13:17:46 [name] ([id number]) -------------------------------------------------------------------------------- Vitals Details Patient Name: [name] Date of Service: 07/19/2021 12:45 PM Medical Record Number: [id number] Patient Account  Number: [id number] Date of Birth/Sex: 07-13-1951 (70 y.o. M) Treating RN: Dolan Amen Primary Care Calise Dunckel: Barney Drain Other Clinician: Referring Jatorian Renault: Barney Drain Treating Dimples Probus/Extender: Skipper Cliche in Treatment: 0 Vital Signs Time Taken: 13:05 Temperature (F): 98.6 Weight (lbs): 180 Pulse (bpm): 79 Source: Measured Respiratory Rate (breaths/min): 18 Blood Pressure (mmHg): 105/59 Reference Range: 80 - 120 mg / dl Electronic Signature(s) Signed: 07/19/2021 4:37:36 PM By: Dolan Amen RN Entered By: Dolan Amen on 07/19/2021 13:05:47

## 2021-07-20 NOTE — Progress Notes (Signed)
[name], [name] ([id number]) Visit Report for 07/19/2021 Biopsy Details Patient Name: ROLLEY, Cuevas Date of Service: 07/19/2021 12:45 PM Medical Record Number: [id number] Patient Account Number: [id number] Date of Birth/Sex: 1951-03-21 (70 y.o. M) Treating RN: Dolan Amen Primary Care Provider: Barney Drain Other Clinician: Referring Provider: Barney Drain Treating Provider/Extender: Skipper Cliche in Treatment: 0 Biopsy Performed for: Wound #2 Right, Medial Ankle Location(s): Wound Bed Performed By: Physician Tommie Sams., PA-C Number of Specimens Taken: 1 Specimen Sent To Pathology: Yes Level of Consciousness (Pre-procedure): Awake and Alert Pre-procedure Verification/Time-Out Taken: Yes - 13:38 Pain Control: Lidocaine Injectable Lidocaine Percent: 2% Instrument: Scissors Bleeding: Moderate Hemostasis Achieved: Silver Nitrate Response to Treatment: Procedure was tolerated well Level of Consciousness (Post-procedure): Awake and Alert Post Procedure Diagnosis Same as Pre-procedure Notes 2 silver nitrate sticks used Electronic Signature(s) Signed: 07/19/2021 4:37:36 PM By: Dolan Amen RN Signed: 07/19/2021 5:58:25 PM By: Worthy Keeler PA-C Entered By: Dolan Amen on 07/19/2021 13:44:52 [name] ([id number]) -------------------------------------------------------------------------------- Chief Complaint Document Details Patient Name: [name] Date of Service: 07/19/2021 12:45 PM Medical Record Number: [id number] Patient Account Number: [id number] Date of Birth/Sex: 1951/06/27 (70 y.o. M) Treating RN: Dolan Amen Primary Care Provider: Barney Drain Other Clinician: Referring Provider: Barney Drain Treating Provider/Extender: Skipper Cliche in Treatment: 0 Information Obtained from: Patient Chief Complaint Right LE Ulcer Electronic Signature(s) Signed: 07/19/2021 1:33:18 PM By: Worthy Keeler PA-C Entered By: Worthy Keeler on 07/19/2021 13:33:18 [name] ([id number]) -------------------------------------------------------------------------------- HPI Details Patient Name: [name] Date of Service: 07/19/2021 12:45 PM Medical Record Number: [id number] Patient Account Number: [id number] Date of Birth/Sex: November 10, 1951 (70 y.o. M) Treating RN: Dolan Amen Primary Care Provider: Barney Drain Other Clinician: Referring Provider: Barney Drain Treating Provider/Extender: Skipper Cliche in Treatment: 0 History of Present Illness HPI Description: 12/21/2020 upon evaluation today patient appears to be doing somewhat poorly in regard to his right lower extremity. He does have some erythema and is also having issues with quite a bit of drainage currently from a wound that he tells me has been present since about 2 months ago when he initially had an injury that led to this wound beginning. He does have chronic venous stasis and is secondary to this I think it has been more difficult to heal this wound than normal. With that being said the patient tells me that this tends to drain a lot down onto his feet. He is also had some issues with fungal infection he is currently on miconazole powder for that. Fortunately there is no signs of active infection systemically at this time. No fevers, chills, nausea, vomiting, or diarrhea. He does have pain but again this is more to the posterior aspect not really more lateral or anterior. There is evidence that he may have some local infection due to erythema and warmth surrounding the wound post debridement I do want to obtain a wound culture to evaluate and see if there is anything indeed of concern here. He did have arterial studies which revealed that he had a ABI of 0.91 on the right with a TBI of 0.76 in the left his ABI was 1.18 with a TBI of 0.73. He has good blood flow here. In regard to his x-ray that was taken on 11/30/2020 there was no evidence  of osseous abnormality. The arterial study was actually performed on 12/03/2020. The patient was hospitalized from 11/30/2020 through 12/02/2020 during that time he was on IV antibiotics  including vancomycin and cefepime. 12/28/2020 upon evaluation today patient appears to be doing somewhat poorly in regard to his legs currently is having a lot of drainage unfortunately which again he was not actually being wrapped with a 3 layer compression wrap after this was changed at pace. Subsequently I think that the patient does need to have ongoing compression therapy. Right now that means that were doing the compression wrap later he will need compression stockings. Either way though I think that he cannot just have a border foam dressing on this which was what was on today that is not sufficient to catch any of the drainage and to be honest I think this can lead to infection and greater issues here for him. The wound is pretty much about circumferential at this point a weeping area included. 01/04/2021 upon evaluation today patient unfortunately is doing quite a bit worse in regard to the overall appearance of his wound from the standpoint of infection. I did see evidence of bright green drainage consistent with Pseudomonas which I discussed with the patient today as well as his representative from social services. Nonetheless I believe that we likely need to treat him for Pseudomonas right now the clindamycin he is on is not covering for this. With that being said no sharp debridement was performed today. Of note the patient's Hydrofera Blue dressing was not the ready transfer version and unfortunately did cause this did not drain quite as well as we would like to. Nonetheless I do feel like that the patient would benefit from continuing with the wraps changed out their pace I think they can definitely do a good job and his swelling is under good control the big thing here is that we need to make sure that the  dressing is applied appropriately we will contact them just to confirm and make sure that they know the appropriate way to apply the [facility] that they have. 01/11/2021 upon evaluation today patient appears to be doing somewhat better in regard to the erythema on his leg at this point. Fortunately there are no signs of active infection at this time. No fevers, chills, nausea, vomiting, or diarrhea. With that being said he has been tolerating the dressing changes without complication and very pleased in that regard. I do think that he seems to be making good progress in general which is good news. I think that the biggest issue I see right now is simply the [facility] that is being used cannot transfer through to the [place] and therefore extracting a lot more fluid against the skin which is causing some irritation as well. I think that we may be able to wrap him here and get things in a better position for him to be honest. 01/18/2021 upon evaluation today patient appears to be doing better in regard to his leg. I feel like he is definitely making improvement and overall I think that this is significantly improved compared to the previous evaluation. With that being said there is still quite a ways to go to get this to heal and it is going to take quite a bit of time. Fortunately there is no evidence of active infection at this time. No fevers, chills, nausea, vomiting, or diarrhea. 01/25/2021 upon evaluation today patient appears to be doing well with regard to his leg. I feel like he is definitely showing signs of improvement which is great news. There is no evidence of active infection at this time. No fevers, chills, nausea, vomiting,  or diarrhea. 02/01/2021 upon evaluation today patient appears to be doing decently well in regard to his leg on the right as compared to previously noted. There does not appear to be any signs of active infection at this time which is great news and overall I am  pleased that his systemically. Nonetheless locally still show signs of erythema he still has several days left of his antibiotic as apparently this was not started immediately after I wrote that for him last time. Nonetheless I think that he is taking the Levaquin now and hopefully its doing job to help clear things up. Nonetheless if he is not significantly better by next week I would likely repeat the culture to try to see if anything is changed as again he still has some erythema this week that has me concerned at this point. 02/07/2021 on evaluation today patient actually appears to be doing well with regard to his wounds in his far as size and overall appearance is concerned. He still having a tremendous amount of drainage she has now completed the Levaquin and unfortunately as opposed to improving the erythema actually seems to have worsened. Has been off the Levaquin for several days therefore I did obtain a new culture today. With that being said I nonetheless feel like that the patient is showing signs of ongoing issues with cellulitis and to be honest not only is he having issues on the right leg which is what we have been monitoring here but he now is having issues on the left leg as well unfortunately. This is even up and around the knee and again I think this is of significant concern to the point that I feel like he should go to the ER for further evaluation and treatment. 07/19/2021 upon evaluation today patient appears for reevaluation here in the clinic last time I saw him was actually March 2022. At that time I sent him to the hospital due to the fact that he had a significant infection. Subsequently upon discharge from the hospital he was sent to a skilled nursing facility and I have not seen him since that time. Fortunately there does not appear to be any evidence of active infection at this time. No fevers, chills, nausea, vomiting, or diarrhea. With that being said I am somewhat  concerned about the overall appearance of the wound bed. Now that it is consolidated to a much smaller area I feel like that the wound is somewhat irregular in the way it appears. For that reason I really do think that we need to probably go ahead and see about doing a biopsy to test for the possibility of a skin cancer here. The patient is in agreement with that plan. KAZUO, DURAY ([id number]) Electronic Signature(s) Signed: 07/19/2021 1:52:03 PM By: Worthy Keeler PA-C Entered By: Worthy Keeler on 07/19/2021 13:52:02 [name] ([id number]) -------------------------------------------------------------------------------- Physical Exam Details Patient Name: [name] Date of Service: 07/19/2021 12:45 PM Medical Record Number: [id number] Patient Account Number: [id number] Date of Birth/Sex: January 25, 1951 (70 y.o. M) Treating RN: Dolan Amen Primary Care Provider: Barney Drain Other Clinician: Referring Provider: Barney Drain Treating Provider/Extender: Skipper Cliche in Treatment: 0 Constitutional sitting or standing blood pressure is within target range for patient.. pulse regular and within target range for patient.Marland Kitchen respirations regular, non- labored and within target range for patient.Marland Kitchen temperature within target range for patient.. Well-nourished and well-hydrated in no acute distress. Eyes conjunctiva clear no eyelid edema noted. pupils equal round and  reactive to light and accommodation. Ears, Nose, Mouth, and Throat no gross abnormality of ear auricles or external auditory canals. normal hearing noted during conversation. mucus membranes moist. Respiratory normal breathing without difficulty. Cardiovascular 2+ dorsalis pedis/posterior tibialis pulses. trace pitting edema of the bilateral lower extremities. Musculoskeletal normal gait and posture. no significant deformity or arthritic changes, no loss or range of motion, no  clubbing. Psychiatric this patient is able to make decisions and demonstrates good insight into disease process. Alert and Oriented x 3. pleasant and cooperative. Notes Upon inspection patient's wound bed actually showed signs of good granulation epithelization at this point in some areas compared to where it was size was previous. With wet remaining however I am concerned that this may be an area that could be a form or another of skin cancer. I think the patient needs to be addressed ASAP as far as treatment is concerned if that is the case since its been going on for some time here. Nonetheless I do believe that the initial first appears can be for [place] to go ahead and get a sample. I did therefore perform a punch biopsy of the region in order to send this for pathology and we will see what that shows. Electronic Signature(s) Signed: 07/19/2021 1:53:37 PM By: Worthy Keeler PA-C Entered By: Worthy Keeler on 07/19/2021 13:53:37 [name] ([id number]) -------------------------------------------------------------------------------- Physician Orders Details Patient Name: [name] Date of Service: 07/19/2021 12:45 PM Medical Record Number: [id number] Patient Account Number: [id number] Date of Birth/Sex: 1950/12/04 (70 y.o. 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 [place] fax # [phone] o Other Home Health Orders/Instructions: - Change 2  x a week, Tuesday and Friday Bathing/ Shower/ Hygiene o May shower with wound dressing protected with water repellent cover or cast protector. Wound Treatment Wound #2 - Ankle Wound Laterality: Right, Medial Cleanser: Soap and Water 2 x Per Week/30 Days Discharge Instructions: Gently cleanse wound with antibacterial soap, rinse and pat dry prior to dressing wounds Primary Dressing: Hydrofera Blue Ready Transfer Foam, 4x5 (in/in) 2 x Per Week/30 Days Discharge Instructions: Apply Hydrofera Blue Ready to wound bed as directed Secondary Dressing: ABD Pad 5x9 (in/in) 2 x Per Week/30 Days Discharge Instructions: Cover with ABD pad Compression Wrap: Profore Lite LF 3 Multilayer Compression Bandaging System 2 x Per Week/30 Days Discharge Instructions: Apply 3 multi-layer wrap as prescribed. Electronic Signature(s) Signed: 07/19/2021 4:37:36 PM By: Dolan Amen RN Signed: 07/19/2021 5:58:25 PM By: Worthy Keeler PA-C Entered By: Dolan Amen on 07/19/2021 13:49:33 [name] ([id number]) -------------------------------------------------------------------------------- Problem List Details Patient Name: [name] Date of Service: 07/19/2021 12:45 PM Medical Record Number: [id number] Patient Account Number: [id number] Date of Birth/Sex: May 11, 1951 (70 y.o. M) Treating RN: Dolan Amen Primary Care Provider: Barney Drain Other Clinician: Referring Provider: Barney Drain Treating Provider/Extender: Skipper Cliche in Treatment: 0 Active Problems ICD-10 Encounter Code Description Active Date MDM Diagnosis I87.2 Venous insufficiency (chronic) (peripheral) 07/19/2021 No Yes L97.312 Non-pressure chronic ulcer of right ankle with fat layer exposed 07/19/2021 No Yes Z87.820 Personal history of  traumatic brain injury 07/19/2021 No Yes F79 Unspecified intellectual disabilities 07/19/2021 No Yes Inactive Problems Resolved Problems Electronic Signature(s) Signed: 07/19/2021  1:33:12 PM By: Worthy Keeler PA-C Entered By: Worthy Keeler on 07/19/2021 13:33:12 [name] ([id number]) -------------------------------------------------------------------------------- Progress Note Details Patient Name: [name] Date of Service: 07/19/2021 12:45 PM Medical Record Number: [id number] Patient Account Number: [id number] Date of Birth/Sex: 06-25-1951 (70 y.o. M) Treating RN: Dolan Amen Primary Care Provider: Barney Drain Other Clinician: Referring Provider: Barney Drain Treating Provider/Extender: Skipper Cliche in Treatment: 0 Subjective Chief Complaint Information obtained from Patient Right LE Ulcer History of Present Illness (HPI) 12/21/2020 upon evaluation today patient appears to be doing somewhat poorly in regard to his right lower extremity. He does have some erythema and is also having issues with quite a bit of drainage currently from a wound that he tells me has been present since about 2 months ago when he initially had an injury that led to this wound beginning. He does have chronic venous stasis and is secondary to this I think it has been more difficult to heal this wound than normal. With that being said the patient tells me that this tends to drain a lot down onto his feet. He is also had some issues with fungal infection he is currently on miconazole powder for that. Fortunately there is no signs of active infection systemically at this time. No fevers, chills, nausea, vomiting, or diarrhea. He does have pain but again this is more to the posterior aspect not really more lateral or anterior. There is evidence that he may have some local infection due to erythema and warmth surrounding the wound post debridement I do want to obtain a wound culture to evaluate and see if there is anything indeed of concern here. He did have arterial studies which revealed that he had a ABI of 0.91 on the right with a TBI of 0.76 in the left his  ABI was 1.18 with a TBI of 0.73. He has good blood flow here. In regard to his x-ray that was taken on 11/30/2020 there was no evidence of osseous abnormality. The arterial study was actually performed on 12/03/2020. The patient was hospitalized from 11/30/2020 through 12/02/2020 during that time he was on IV antibiotics including vancomycin and cefepime. 12/28/2020 upon evaluation today patient appears to be doing somewhat poorly in regard to his legs currently is having a lot of drainage unfortunately which again he was not actually being wrapped with a 3 layer compression wrap after this was changed at pace. Subsequently I think that the patient does need to have ongoing compression therapy. Right now that means that were doing the compression wrap later he will need compression stockings. Either way though I think that he cannot just have a border foam dressing on this which was what was on today that is not sufficient to catch any of the drainage and to be honest I think this can lead to infection and greater issues here for him. The wound is pretty much about circumferential at this point a weeping area included. 01/04/2021 upon evaluation today patient unfortunately is doing quite a bit worse in regard to the overall appearance of his wound from the standpoint of infection. I did see evidence of bright green drainage consistent with Pseudomonas which I discussed with the patient today as well as his representative from social services. Nonetheless I believe that we likely need to treat him for Pseudomonas right now the clindamycin he  is on is not covering for this. With that being said no sharp debridement was performed today. Of note the patient's Hydrofera Blue dressing was not the ready transfer version and unfortunately did cause this did not drain quite as well as we would like to. Nonetheless I do feel like that the patient would benefit from continuing with the wraps changed out their pace I  think they can definitely do a good job and his swelling is under good control the big thing here is that we need to make sure that the dressing is applied appropriately we will contact them just to confirm and make sure that they know the appropriate way to apply the [facility] that they have. 01/11/2021 upon evaluation today patient appears to be doing somewhat better in regard to the erythema on his leg at this point. Fortunately there are no signs of active infection at this time. No fevers, chills, nausea, vomiting, or diarrhea. With that being said he has been tolerating the dressing changes without complication and very pleased in that regard. I do think that he seems to be making good progress in general which is good news. I think that the biggest issue I see right now is simply the [facility] that is being used cannot transfer through to the [place] and therefore extracting a lot more fluid against the skin which is causing some irritation as well. I think that we may be able to wrap him here and get things in a better position for him to be honest. 01/18/2021 upon evaluation today patient appears to be doing better in regard to his leg. I feel like he is definitely making improvement and overall I think that this is significantly improved compared to the previous evaluation. With that being said there is still quite a ways to go to get this to heal and it is going to take quite a bit of time. Fortunately there is no evidence of active infection at this time. No fevers, chills, nausea, vomiting, or diarrhea. 01/25/2021 upon evaluation today patient appears to be doing well with regard to his leg. I feel like he is definitely showing signs of improvement which is great news. There is no evidence of active infection at this time. No fevers, chills, nausea, vomiting, or diarrhea. 02/01/2021 upon evaluation today patient appears to be doing decently well in regard to his leg on the right as  compared to previously noted. There does not appear to be any signs of active infection at this time which is great news and overall I am pleased that his systemically. Nonetheless locally still show signs of erythema he still has several days left of his antibiotic as apparently this was not started immediately after I wrote that for him last time. Nonetheless I think that he is taking the Levaquin now and hopefully its doing job to help clear things up. Nonetheless if he is not significantly better by next week I would likely repeat the culture to try to see if anything is changed as again he still has some erythema this week that has me concerned at this point. 02/07/2021 on evaluation today patient actually appears to be doing well with regard to his wounds in his far as size and overall appearance is concerned. He still having a tremendous amount of drainage she has now completed the Levaquin and unfortunately as opposed to improving the erythema actually seems to have worsened. Has been off the Levaquin for several days therefore I did obtain  a new culture today. With that being said I nonetheless feel like that the patient is showing signs of ongoing issues with cellulitis and to be honest not only is he having issues on the right leg which is what we have been monitoring here but he now is having issues on the left leg as well unfortunately. This is even up and around the knee and again I think this is of significant concern to the point that I feel like he should go to the ER for further evaluation and treatment. 07/19/2021 upon evaluation today patient appears for reevaluation here in the clinic last time I saw him was actually March 2022. At that time I sent him to the hospital due to the fact that he had a significant infection. Subsequently upon discharge from the hospital he was sent to a skilled [facility] ([id number]) nursing facility and I have not seen him since that time.  Fortunately there does not appear to be any evidence of active infection at this time. No fevers, chills, nausea, vomiting, or diarrhea. With that being said I am somewhat concerned about the overall appearance of the wound bed. Now that it is consolidated to a much smaller area I feel like that the wound is somewhat irregular in the way it appears. For that reason I really do think that we need to probably go ahead and see about doing a biopsy to test for the possibility of a skin cancer here. The patient is in agreement with that plan. Patient History Information obtained from Patient. Allergies No Known Drug Allergies Family History Diabetes - Father. Social History Never smoker, Marital Status - Widowed, Alcohol Use - Daily, Drug Use - No History, Caffeine Use - Daily. Medical History Respiratory Patient has history of Asthma Cardiovascular Patient has history of Hypertension - controlled with medication Endocrine Denies history of Type II Diabetes Integumentary (Skin) Patient has history of History of pressure wounds Neurologic Patient has history of Neuropathy Medical And Surgical History Notes Constitutional Symptoms (General Health) TBI-Tramatic Brain Injury, HTN Mental Retardation Neurologic TBI hx Psychiatric Mental retardation, Tramatic Brain Injury Review of Systems ([name]) Constitutional Symptoms (General Health) Denies complaints or symptoms of Fatigue, Fever, Chills, Marked Weight Change. Eyes Denies complaints or symptoms of Dry Eyes, Vision Changes, Glasses / Contacts. Ear/Nose/Mouth/Throat Denies complaints or symptoms of Difficult clearing ears, Sinusitis. Respiratory Denies complaints or symptoms of Chronic or frequent coughs, Shortness of Breath. Cardiovascular Complains or has symptoms of LE edema. Integumentary (Skin) Complains or has symptoms of Wounds, Swelling. Musculoskeletal Denies complaints or symptoms of Muscle Pain, Muscle  Weakness. Neurologic Complains or has symptoms of Focal/Weakness. Objective Constitutional sitting or standing blood pressure is within target range for patient.. pulse regular and within target range for patient.Marland Kitchen respirations regular, non- labored and within target range for patient.Marland Kitchen temperature within target range for patient.. Well-nourished and well-hydrated in no acute distress. Vitals Time Taken: 1:05 PM, Weight: 180 lbs, Source: Measured, Temperature: 98.6 F, Pulse: 79 bpm, Respiratory Rate: 18 breaths/min, Blood Pressure: 105/59 mmHg. Eyes conjunctiva clear no eyelid edema noted. pupils equal round and reactive to light and accommodation. TURON, NORTHCRAFT ([id number]) Ears, Nose, Mouth, and Throat no gross abnormality of ear auricles or external auditory canals. normal hearing noted during conversation. mucus membranes moist. Respiratory normal breathing without difficulty. Cardiovascular 2+ dorsalis pedis/posterior tibialis pulses. trace pitting edema of the bilateral lower extremities. Musculoskeletal normal gait and posture. no significant deformity or arthritic changes, no loss or range of motion,  no clubbing. Psychiatric this patient is able to make decisions and demonstrates good insight into disease process. Alert and Oriented x 3. pleasant and cooperative. General Notes: Upon inspection patient's wound bed actually showed signs of good granulation epithelization at this point in some areas compared to where it was size was previous. With wet remaining however I am concerned that this may be an area that could be a form or another of skin cancer. I think the patient needs to be addressed ASAP as far as treatment is concerned if that is the case since its been going on for some time here. Nonetheless I do believe that the initial first appears can be for [place] to go ahead and get a sample. I did therefore perform a punch biopsy of the region in order to send this for  pathology and we will see what that shows. Integumentary (Hair, Skin) Wound #2 status is Open. Original cause of wound was Gradually Appeared. The date acquired was: 10/20/2020. The wound is located on the Right,Medial Ankle. The wound measures 4.7cm length x 4cm width x 0.2cm depth; 14.765cm^2 area and 2.953cm^3 volume. There is Fat Layer (Subcutaneous Tissue) exposed. There is no tunneling or undermining noted. There is a medium amount of serosanguineous drainage noted. There is medium (34-66%) red, hyper - granulation within the wound bed. There is a medium (34-66%) amount of necrotic tissue within the wound bed including Adherent Slough. 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 There was a biopsy performed by Tommie Sams., PA-C. There was a biopsy performed on Wound Bed. The skin was cleansed and prepped with anti-septic followed by pain control using Lidocaine Injectable: 2%. Tissue was removed at its base with the following instrument(s): Scissors and sent to pathology. A Moderate amount of bleeding was controlled with Silver Nitrate. A time out was conducted at 13:38, prior to the start of the procedure. The procedure was tolerated well. Post procedure Diagnosis Wound #2: Same as Pre-Procedure General Notes: 2 silver nitrate sticks used. Pre-procedure diagnosis of Wound #2 is a Venous Leg Ulcer located on the Right,Medial Ankle . There was a Three Layer Compression Therapy Procedure by Dolan Amen, RN. Post procedure Diagnosis Wound #2: Same as Pre-Procedure Plan Follow-up Appointments: Return Appointment in 2 weeks. Home Health: [facility] fax #  [phone] Other Home Health Orders/Instructions: - Change 2 x a week, Tuesday and Friday Bathing/ Shower/ Hygiene: GABIN, SCHWIETERMAN ([id number]) May shower with wound dressing protected with water repellent cover or cast protector. WOUND #2: - Ankle Wound Laterality: Right, Medial Cleanser: Soap and Water 2 x Per Week/30 Days Discharge Instructions: Gently cleanse wound with antibacterial soap, rinse and pat dry prior to dressing wounds Primary Dressing: Hydrofera Blue Ready Transfer Foam, 4x5 (in/in) 2 x Per Week/30 Days Discharge Instructions: Apply Hydrofera Blue Ready to wound bed as directed Secondary Dressing: ABD Pad 5x9 (in/in) 2 x Per Week/30 Days Discharge Instructions: Cover with ABD pad Compression Wrap: Profore Lite LF 3 Multilayer Compression Bandaging System 2 x Per Week/30 Days Discharge Instructions: Apply 3 multi-layer wrap as prescribed. 1. Would recommend currently that we go ahead and initiate treatment with a Hydrofera Blue dressing I think this can be the best way to go and the  patient is in agreement with that plan. 2. I am also can recommend that we have the patient go ahead and continue with the wound care measures as before with the 3 layer compression wrap I think that is probably still our best option currently as far as compression is concerned and I think that he is in the past done well with this. 3. I am also can recommend that again we see what the pathology shows and depending on the results of the pathology we may need to make adjustments in his therapy in general. The biggest issue obviously right now being that if he does have a skin cancer he is going to need to see the surgeon before anything else is done from a wound healing perspective. We will see patient back for reevaluation in 1 week here in the clinic. If anything worsens or changes patient will contact our office for additional recommendations. Electronic Signature(s) Signed: 07/19/2021  1:53:44 PM By: Worthy Keeler PA-C Entered By: Worthy Keeler on 07/19/2021 13:53:44 [name] ([id number]) -------------------------------------------------------------------------------- [name]/PFSH Details Patient Name: [name] Date of Service: 07/19/2021 12:45 PM Medical Record Number: [id number] Patient Account Number: [id number] Date of Birth/Sex: 1951/06/19 (70 y.o. M) Treating RN: Dolan Amen Primary Care Provider: Barney Drain Other Clinician: Referring Provider: Barney Drain Treating Provider/Extender: Skipper Cliche in Treatment: 0 Information Obtained From Patient Constitutional Symptoms (General Health) Complaints and Symptoms: Negative for: Fatigue; Fever; Chills; Marked Weight Change Medical History: Past Medical History Notes: TBI-Tramatic Brain Injury, HTN Mental Retardation Eyes Complaints and Symptoms: Negative for: Dry Eyes; Vision Changes; Glasses / Contacts Ear/Nose/Mouth/Throat Complaints and Symptoms: Negative for: Difficult clearing ears; Sinusitis Respiratory Complaints and Symptoms: Negative for: Chronic or frequent coughs; Shortness of Breath Medical History: Positive for: Asthma Cardiovascular Complaints and Symptoms: Positive for: LE edema Medical History: Positive for: Hypertension - controlled with medication Integumentary (Skin) Complaints and Symptoms: Positive for: Wounds; Swelling Medical History: Positive for: History of pressure wounds Musculoskeletal Complaints and Symptoms: Negative for: Muscle Pain; Muscle Weakness Neurologic Complaints and Symptoms: Positive for: Focal/Weakness Medical History: Positive for: Neuropathy Past Medical History Notes: TBI hx XAVYER, SULEWSKI ([id number]) Endocrine Medical History: Negative for: Type II Diabetes Psychiatric Medical History: Past Medical History Notes: Mental retardation, Tramatic Brain Injury Immunizations Pneumococcal Vaccine: Received  Pneumococcal Vaccination: Yes Received Pneumococcal Vaccination On or After 60th Birthday: Yes Implantable Devices No devices added Family and Social History Diabetes: Yes - Father; Never smoker; Marital Status - Widowed; Alcohol Use: Daily; Drug Use: No History; Caffeine Use: Daily Electronic Signature(s) Signed: 07/19/2021 4:37:36 PM By: Dolan Amen RN Signed: 07/19/2021 5:58:25 PM By: Worthy Keeler PA-C Entered By: Dolan Amen on 07/19/2021 13:31:17 [name] ([id number]) -------------------------------------------------------------------------------- SuperBill Details Patient Name: [name] Date of Service: 07/19/2021 Medical Record Number: [id number] Patient Account Number: [id number] Date of Birth/Sex: 1951/07/20 (70 y.o. M) Treating RN: Dolan Amen Primary Care Provider: Barney Drain Other Clinician: Referring Provider: Barney Drain Treating Provider/Extender: Skipper Cliche in Treatment: 0 Diagnosis Coding ICD-10 Codes Code Description I87.2 Venous insufficiency (chronic) (peripheral) L97.312 Non-pressure chronic ulcer of right ankle with fat layer exposed Z87.820 Personal history of traumatic brain injury F79 Unspecified intellectual disabilities Facility Procedures CPT4 Code: [id number] Description: 99213 - WOUND CARE VISIT-LEV 3 EST PT Modifier: Quantity: 1 CPT4 Code: [id number] Description: 11104-Punch biopsy of skin (including simple closure, when performed) single lesion Modifier: Quantity: 1 CPT4 Code: Description: ICD-10 Diagnosis Description L97.312 Non-pressure chronic ulcer of right  ankle with fat layer exposed Modifier: Quantity: CPT4 Code: [id number] Description: (Facility Use Only) [id number] - APPLY MULTLAY COMPRS LWR RT LEG Modifier: Quantity: 1 Physician Procedures CPT4 Code: [id number] Description: [id number] - WC PHYS LEVEL 4 - EST PT Modifier: Quantity: 1 CPT4 Code: Description: ICD-10 Diagnosis Description I87.2 Venous  insufficiency (chronic) (peripheral) L97.312 Non-pressure chronic ulcer of right ankle with fat layer exposed Z87.820 Personal history of traumatic brain injury F79 Unspecified intellectual  disabilities Modifier: Quantity: CPT4 Code: 11104 Description: Punch biopsy of skin (including simple closure, when performed) single lesion Modifier: Quantity: 1 CPT4 Code: Description: ICD-10 Diagnosis Description [id number] Non-pressure chronic ulcer of right ankle with fat layer exposed Modifier: Quantity: Electronic Signature(s) Signed: 07/19/2021 2:12:33 PM By: Dolan Amen RN Signed: 07/19/2021 5:58:25 PM By: Worthy Keeler PA-C Previous Signature: 07/19/2021 1:54:01 PM Version By: Worthy Keeler PA-C Entered By: Dolan Amen on 07/19/2021 14:12:32

## 2021-07-20 NOTE — Progress Notes (Signed)
[name], [name] ([id number]) Visit Report for 07/19/2021 Abuse/Suicide Risk Screen Details Patient Name: [name], [name] Date of Service: 07/19/2021 12:45 PM Medical Record Number: [id number] Patient Account Number: [id number] Date of Birth/Sex: 04/23/1951 (70 y.o. M) Treating RN: Dolan Amen Primary Care Kiah Keay: Barney Drain Other Clinician: Referring Rozalynn Buege: Barney Drain Treating Regnald Bowens/Extender: Skipper Cliche in Treatment: 0 Abuse/Suicide Risk Screen Items Answer ABUSE RISK SCREEN: Has anyone close to you tried to hurt or harm you recentlyo No Do you feel uncomfortable with anyone in your familyo No Has anyone forced you do things that you didnot want to doo No Electronic Signature(s) Signed: 07/19/2021 4:37:36 PM By: Dolan Amen RN Entered By: Dolan Amen on 07/19/2021 13:07:06 [name] ([id number]) -------------------------------------------------------------------------------- Activities of Daily Living Details Patient Name: [name] Date of Service: 07/19/2021 12:45 PM Medical Record Number: [id number] Patient Account Number: [id number] Date of Birth/Sex: Aug 27, 1951 (70 y.o. M) Treating RN: Dolan Amen Primary Care Marquisha Nikolov: Barney Drain Other Clinician: Referring Zainab Crumrine: Barney Drain Treating Chord Takahashi/Extender: Skipper Cliche in Treatment: 0 Activities of Daily Living Items Answer Activities of Daily Living (Please select one for each item) Drive Automobile Not Able Take Medications Need Assistance Use Telephone Need Assistance Care for Appearance Completely Able Use Toilet Completely Able Bath / Shower Completely Able Dress Self Completely Able Feed Self Need Assistance Walk Need Assistance Get In / Out Bed Completely Able Housework Completely Able Prepare Meals Completely Able Handle Money Completely Able Shop for Self Completely Able Electronic Signature(s) Signed: 07/19/2021 4:37:36 PM By: Dolan Amen RN Entered By: Dolan Amen on 07/19/2021 13:07:40 [name] ([id number]) -------------------------------------------------------------------------------- Education Screening Details Patient Name: [name] Date of Service: 07/19/2021 12:45 PM Medical Record Number: [id number] Patient Account Number: [id number] Date of Birth/Sex: 1951-11-09 (70 y.o. M) Treating RN: Dolan Amen Primary Care Davina Howlett: Barney Drain Other Clinician: Referring Camdan Burdi: Barney Drain Treating Florestine Carmical/Extender: Skipper Cliche in Treatment: 0 Primary Learner Assessed: Patient Learning Preferences/Education Level/Primary Language Learning Preference: Explanation, Demonstration Highest Education Level: Grade School Preferred Language: English Cognitive Barrier Language Barrier: No Translator Needed: No Memory Deficit: No Emotional Barrier: No Physical Barrier Impaired Vision: No Impaired Hearing: No Decreased Hand dexterity: No Knowledge/Comprehension Knowledge Level: Low Comprehension Level: Low Ability to understand written instructions: Low Ability to understand verbal instructions: Medium Motivation Anxiety Level: Calm Cooperation: Cooperative Education Importance: Acknowledges Need Interest in Health Problems: Asks Questions Perception: Coherent Willingness to Engage in Self-Management Medium Activities: Readiness to Engage in Self-Management Medium Activities: Electronic Signature(s) Signed: 07/19/2021 4:37:36 PM By: Dolan Amen RN Entered By: Dolan Amen on 07/19/2021 13:08:31 [name] ([id number]) -------------------------------------------------------------------------------- Fall Risk Assessment Details Patient Name: [name] Date of Service: 07/19/2021 12:45 PM Medical Record Number: [id number] Patient Account Number: [id number] Date of Birth/Sex: May 08, 1951 (70 y.o. M) Treating RN: Dolan Amen Primary Care  Kiona Blume: Barney Drain Other Clinician: Referring Lanah Steines: Barney Drain Treating Baileigh Modisette/Extender: Skipper Cliche in Treatment: 0 Fall Risk Assessment Items Have you had 2 or more falls in the last 12 monthso 0 No Have you had any fall that resulted in injury in the last 12 monthso 0 No FALLS RISK SCREEN History of falling - immediate or within 3 months 0 No Secondary diagnosis (Do you have 2 or more medical diagnoseso) 15 Yes Ambulatory aid None/bed rest/wheelchair/nurse 0 No Crutches/cane/walker 15 Yes Furniture 0 No Intravenous therapy Access/Saline/Heparin Lock 0 No Gait/Transferring Normal/ bed rest/ wheelchair 0 No Weak (short steps with  or without shuffle, stooped but able to lift head while walking, may 10 Yes seek support from furniture) Impaired (short steps with shuffle, may have difficulty arising from chair, head down, impaired 0 No balance) Mental Status Oriented to own ability 0 Yes Electronic Signature(s) Signed: 07/19/2021 4:37:36 PM By: Dolan Amen RN Entered By: Dolan Amen on 07/19/2021 13:08:50 [name] ([id number]) -------------------------------------------------------------------------------- Foot Assessment Details Patient Name: [name] Date of Service: 07/19/2021 12:45 PM Medical Record Number: [id number] Patient Account Number: [id number] Date of Birth/Sex: [birth year] (70 y.o. M) Treating RN: Dolan Amen Primary Care Keslie Gritz: Barney Drain Other Clinician: Referring Adalee Kathan: Barney Drain Treating Savi Lastinger/Extender: Skipper Cliche in Treatment: 0 Foot Assessment Items Site Locations + = Sensation present, - = Sensation absent, C = Callus, U = Ulcer R = Redness, W = Warmth, M = Maceration, PU = Pre-ulcerative lesion F = Fissure, S = Swelling, D = Dryness Assessment Right: Left: Other Deformity: No No Prior Foot Ulcer: No No Prior Amputation: No No Charcot Joint: No No Ambulatory Status:  Ambulatory With Help Assistance Device: Cane Gait: Administrator, arts) Signed: 07/19/2021 4:37:36 PM By: Dolan Amen RN Entered By: Dolan Amen on 07/19/2021 13:09:12 [name] ([id number]) -------------------------------------------------------------------------------- Nutrition Risk Screening Details Patient Name: [name] Date of Service: 07/19/2021 12:45 PM Medical Record Number: [id number] Patient Account Number: [id number] Date of Birth/Sex: Oct 07, 1951 (70 y.o. M) Treating RN: Dolan Amen Primary Care Neida Ellegood: Barney Drain Other Clinician: Referring Ione [place]: Barney Drain Treating Wilder Amodei/Extender: Skipper Cliche in Treatment: 0 Height (in): Weight (lbs): 180 Body Mass Index (BMI): Nutrition Risk Screening Items Score Screening NUTRITION RISK SCREEN: I have an illness or condition that made me change the kind and/or amount of food I eat 0 No I eat fewer than two meals per day 0 No I eat few fruits and vegetables, or milk products 0 No I have three or more drinks of beer, liquor or wine almost every day 0 No I have tooth or mouth problems that make it hard for me to eat 0 No I don't always have enough money to buy the food I need 0 No I eat alone most of the time 0 No I take three or more different prescribed or over-the-counter drugs a day 1 Yes Without wanting to, I have lost or gained 10 pounds in the last six months 0 No I am not always physically able to shop, cook and/or feed myself 0 No Nutrition Protocols Good Risk Protocol 0 No interventions needed Moderate Risk Protocol High Risk Proctocol Risk Level: Good Risk Score: 1 Electronic Signature(s) Signed: 07/19/2021 4:37:36 PM By: Dolan Amen RN Entered By: Dolan Amen on 07/19/2021 13:09:01

## 2021-07-22 LAB — SURGICAL PATHOLOGY

## 2021-08-01 ENCOUNTER — Inpatient Hospital Stay (HOSPITAL_COMMUNITY)
Admission: EM | Admit: 2021-08-01 | Discharge: 2021-08-05 | DRG: 603 | Disposition: A | Discharge status: Skilled Nursing Facility | Payer: Medicare (Managed Care) | Attending: Internal Medicine | Admitting: Internal Medicine

## 2021-08-01 ENCOUNTER — Emergency Department (HOSPITAL_COMMUNITY): Payer: Medicare (Managed Care)

## 2021-08-01 ENCOUNTER — Encounter: Payer: Medicare (Managed Care) | Attending: Physician Assistant | Admitting: Physician Assistant

## 2021-08-01 ENCOUNTER — Other Ambulatory Visit: Payer: Self-pay

## 2021-08-01 DIAGNOSIS — Z87820 Personal history of traumatic brain injury: Secondary | ICD-10-CM | POA: Insufficient documentation

## 2021-08-01 DIAGNOSIS — L03115 Cellulitis of right lower limb: Principal | ICD-10-CM | POA: Diagnosis present

## 2021-08-01 DIAGNOSIS — S81801A Unspecified open wound, right lower leg, initial encounter: Secondary | ICD-10-CM

## 2021-08-01 DIAGNOSIS — L0390 Cellulitis, unspecified: Secondary | ICD-10-CM | POA: Diagnosis present

## 2021-08-01 DIAGNOSIS — R7982 Elevated C-reactive protein (CRP): Secondary | ICD-10-CM | POA: Diagnosis present

## 2021-08-01 DIAGNOSIS — L97312 Non-pressure chronic ulcer of right ankle with fat layer exposed: Secondary | ICD-10-CM | POA: Insufficient documentation

## 2021-08-01 DIAGNOSIS — Z833 Family history of diabetes mellitus: Secondary | ICD-10-CM

## 2021-08-01 DIAGNOSIS — K219 Gastro-esophageal reflux disease without esophagitis: Secondary | ICD-10-CM | POA: Diagnosis present

## 2021-08-01 DIAGNOSIS — I878 Other specified disorders of veins: Secondary | ICD-10-CM | POA: Diagnosis present

## 2021-08-01 DIAGNOSIS — Z6828 Body mass index (BMI) 28.0-28.9, adult: Secondary | ICD-10-CM

## 2021-08-01 DIAGNOSIS — I872 Venous insufficiency (chronic) (peripheral): Secondary | ICD-10-CM | POA: Insufficient documentation

## 2021-08-01 DIAGNOSIS — L089 Local infection of the skin and subcutaneous tissue, unspecified: Secondary | ICD-10-CM | POA: Diagnosis present

## 2021-08-01 DIAGNOSIS — L97319 Non-pressure chronic ulcer of right ankle with unspecified severity: Secondary | ICD-10-CM | POA: Diagnosis present

## 2021-08-01 DIAGNOSIS — Z20822 Contact with and (suspected) exposure to covid-19: Secondary | ICD-10-CM | POA: Diagnosis present

## 2021-08-01 DIAGNOSIS — R4189 Other symptoms and signs involving cognitive functions and awareness: Secondary | ICD-10-CM | POA: Diagnosis present

## 2021-08-01 DIAGNOSIS — Z79899 Other long term (current) drug therapy: Secondary | ICD-10-CM

## 2021-08-01 DIAGNOSIS — I10 Essential (primary) hypertension: Secondary | ICD-10-CM | POA: Diagnosis present

## 2021-08-01 DIAGNOSIS — T148XXA Other injury of unspecified body region, initial encounter: Secondary | ICD-10-CM | POA: Diagnosis present

## 2021-08-01 DIAGNOSIS — F79 Unspecified intellectual disabilities: Secondary | ICD-10-CM | POA: Diagnosis not present

## 2021-08-01 DIAGNOSIS — Z7951 Long term (current) use of inhaled steroids: Secondary | ICD-10-CM

## 2021-08-01 DIAGNOSIS — Z9841 Cataract extraction status, right eye: Secondary | ICD-10-CM

## 2021-08-01 DIAGNOSIS — R262 Difficulty in walking, not elsewhere classified: Secondary | ICD-10-CM | POA: Diagnosis present

## 2021-08-01 DIAGNOSIS — Z9842 Cataract extraction status, left eye: Secondary | ICD-10-CM

## 2021-08-01 DIAGNOSIS — E669 Obesity, unspecified: Secondary | ICD-10-CM | POA: Diagnosis present

## 2021-08-01 LAB — CBC WITH DIFFERENTIAL/PLATELET
Abs Immature Granulocytes: 0.02 10*3/uL (ref 0.00–0.07)
Basophils Absolute: 0 10*3/uL (ref 0.0–0.1)
Basophils Relative: 1 %
Eosinophils Absolute: 0.3 10*3/uL (ref 0.0–0.5)
Eosinophils Relative: 4 %
HCT: 34.6 % — ABNORMAL LOW (ref 39.0–52.0)
Hemoglobin: 10.9 g/dL — ABNORMAL LOW (ref 13.0–17.0)
Immature Granulocytes: 0 %
Lymphocytes Relative: 17 %
Lymphs Abs: 1.1 10*3/uL (ref 0.7–4.0)
MCH: 29 pg (ref 26.0–34.0)
MCHC: 31.5 g/dL (ref 30.0–36.0)
MCV: 92 fL (ref 80.0–100.0)
Monocytes Absolute: 0.8 10*3/uL (ref 0.1–1.0)
Monocytes Relative: 12 %
Neutro Abs: 4.4 10*3/uL (ref 1.7–7.7)
Neutrophils Relative %: 66 %
Platelets: 229 10*3/uL (ref 150–400)
RBC: 3.76 MIL/uL — ABNORMAL LOW (ref 4.22–5.81)
RDW: 14.3 % (ref 11.5–15.5)
WBC: 6.6 10*3/uL (ref 4.0–10.5)
nRBC: 0 % (ref 0.0–0.2)

## 2021-08-01 LAB — LACTIC ACID, PLASMA: Lactic Acid, Venous: 0.8 mmol/L (ref 0.5–1.9)

## 2021-08-01 LAB — BASIC METABOLIC PANEL
Anion gap: 7 (ref 5–15)
BUN: 14 mg/dL (ref 8–23)
CO2: 25 mmol/L (ref 22–32)
Calcium: 8.7 mg/dL — ABNORMAL LOW (ref 8.9–10.3)
Chloride: 104 mmol/L (ref 98–111)
Creatinine, Ser: 0.89 mg/dL (ref 0.61–1.24)
GFR, Estimated: >60 mL/min (ref >60)
Glucose, Bld: 93 mg/dL (ref 70–99)
Potassium: 3.7 mmol/L (ref 3.5–5.1)
Sodium: 136 mmol/L (ref 135–145)

## 2021-08-01 NOTE — Progress Notes (Addendum)
[name], [name] ([id number]) Visit Report for 08/01/2021 Chief Complaint Document Details Patient Name: [name] Date of Service: 08/01/2021 2:45 PM Medical Record Number: [id number] Patient Account Number: [id number] Date of Birth/Sex: October 17, 1951 (70 y.o. M) Treating RN: Dolan Amen Primary Care Provider: Barney Drain Other Clinician: Referring Provider: Barney Drain Treating Provider/Extender: Skipper Cliche in Treatment: 1 Information Obtained from: Patient Chief Complaint Right LE Ulcer Electronic Signature(s) Signed: 08/01/2021 3:10:33 PM By: Worthy Keeler PA-C Entered By: Worthy Keeler on 08/01/2021 15:10:33 [name] ([id number]) -------------------------------------------------------------------------------- HPI Details Patient Name: [name] Date of Service: 08/01/2021 2:45 PM Medical Record Number: [id number] Patient Account Number: [id number] Date of Birth/Sex: [birth year] (70 y.o. M) Treating RN: Dolan Amen Primary Care Provider: Barney Drain Other Clinician: Referring Provider: Barney Drain Treating Provider/Extender: Skipper Cliche in Treatment: 1 History of Present Illness HPI Description: 12/21/2020 upon evaluation today patient appears to be doing somewhat poorly in regard to his right lower extremity. He does have some erythema and is also having issues with quite a bit of drainage currently from a wound that he tells me has been present since about 2 months ago when he initially had an injury that led to this wound beginning. He does have chronic venous stasis and is secondary to this I think it has been more difficult to heal this wound than normal. With that being said the patient tells me that this tends to drain a lot down onto his feet. He is also had some issues with fungal infection he is currently on miconazole powder for that. Fortunately there is no signs of active infection systemically at this time. No  fevers, chills, nausea, vomiting, or diarrhea. He does have pain but again this is more to the posterior aspect not really more lateral or anterior. There is evidence that he may have some local infection due to erythema and warmth surrounding the wound post debridement I do want to obtain a wound culture to evaluate and see if there is anything indeed of concern here. He did have arterial studies which revealed that he had a ABI of 0.91 on the right with a TBI of 0.76 in the left his ABI was 1.18 with a TBI of 0.73. He has good blood flow here. In regard to his x-ray that was taken on 11/30/2020 there was no evidence of osseous abnormality. The arterial study was actually performed on 12/03/2020. The patient was hospitalized from 11/30/2020 through 12/02/2020 during that time he was on IV antibiotics including vancomycin and cefepime. 12/28/2020 upon evaluation today patient appears to be doing somewhat poorly in regard to his legs currently is having a lot of drainage unfortunately which again he was not actually being wrapped with a 3 layer compression wrap after this was changed at [facility]. Subsequently I think that the patient does need to have ongoing compression therapy. Right now that means that were doing the compression wrap later he will need compression stockings. Either way though I think that he cannot just have a border foam dressing on this which was what was on today that is not sufficient to catch any of the drainage and to be honest I think this can lead to infection and greater issues here for him. The wound is pretty much about circumferential at this point a weeping area included. 01/04/2021 upon evaluation today patient unfortunately is doing quite a bit worse in regard to the overall appearance of his wound from the standpoint of infection. I  did see evidence of bright green drainage consistent with Pseudomonas which I discussed with the patient today as well as his representative from  social services. Nonetheless I believe that we likely need to treat him for Pseudomonas right now the clindamycin he is on is not covering for this. With that being said no sharp debridement was performed today. Of note the patient's Hydrofera Blue dressing was not the ready transfer version and unfortunately did cause this did not drain quite as well as we would like to. Nonetheless I do feel like that the patient would benefit from continuing with the wraps changed out their pace I think they can definitely do a good job and his swelling is under good control the big thing here is that we need to make sure that the dressing is applied appropriately we will contact them just to confirm and make sure that they know the appropriate way to apply the [facility] that they have. 01/11/2021 upon evaluation today patient appears to be doing somewhat better in regard to the erythema on his leg at this point. Fortunately there are no signs of active infection at this time. No fevers, chills, nausea, vomiting, or diarrhea. With that being said he has been tolerating the dressing changes without complication and very pleased in that regard. I do think that he seems to be making good progress in general which is good news. I think that the biggest issue I see right now is simply the [facility] that is being used cannot transfer through to the [place] and therefore extracting a lot more fluid against the skin which is causing some irritation as well. I think that we may be able to wrap him here and get things in a better position for him to be honest. 01/18/2021 upon evaluation today patient appears to be doing better in regard to his leg. I feel like he is definitely making improvement and overall I think that this is significantly improved compared to the previous evaluation. With that being said there is still quite a ways to go to get this to heal and it is going to take quite a bit of time. Fortunately  there is no evidence of active infection at this time. No fevers, chills, nausea, vomiting, or diarrhea. 01/25/2021 upon evaluation today patient appears to be doing well with regard to his leg. I feel like he is definitely showing signs of improvement which is great news. There is no evidence of active infection at this time. No fevers, chills, nausea, vomiting, or diarrhea. 02/01/2021 upon evaluation today patient appears to be doing decently well in regard to his leg on the right as compared to previously noted. There does not appear to be any signs of active infection at this time which is great news and overall I am pleased that his systemically. Nonetheless locally still show signs of erythema he still has several days left of his antibiotic as apparently this was not started immediately after I wrote that for him last time. Nonetheless I think that he is taking the Levaquin now and hopefully its doing job to help clear things up. Nonetheless if he is not significantly better by next week I would likely repeat the culture to try to see if anything is changed as again he still has some erythema this week that has me concerned at this point. 02/07/2021 on evaluation today patient actually appears to be doing well with regard to his wounds in his far as size and overall  appearance is concerned. He still having a tremendous amount of drainage she has now completed the Levaquin and unfortunately as opposed to improving the erythema actually seems to have worsened. Has been off the Levaquin for several days therefore I did obtain a new culture today. With that being said I nonetheless feel like that the patient is showing signs of ongoing issues with cellulitis and to be honest not only is he having issues on the right leg which is what we have been monitoring here but he now is having issues on the left leg as well unfortunately. This is even up and around the knee and again I think this is of  significant concern to the point that I feel like he should go to the ER for further evaluation and treatment. 07/19/2021 upon evaluation today patient appears for reevaluation here in the clinic last time I saw him was actually March 2022. At that time I sent him to the hospital due to the fact that he had a significant infection. Subsequently upon discharge from the hospital he was sent to a skilled nursing facility and I have not seen him since that time. Fortunately there does not appear to be any evidence of active infection at this time. No fevers, chills, nausea, vomiting, or diarrhea. With that being said I am somewhat concerned about the overall appearance of the wound bed. Now that it is consolidated to a much smaller area I feel like that the wound is somewhat irregular in the way it appears. For that reason I really do think that we need to probably go ahead and see about doing a biopsy to test for the possibility of a skin cancer here. The patient is in agreement with that plan. [name], [name] ([id number]) 08/01/2021 upon evaluation today patient is actually seen for an early appointment that he was actually supposed to be here on Thursday he comes in 3 days early due to the fact that the provider at [facility] felt like he needed to be seen sooner due to the severity of the wound. It does indeed appear to be doing significantly worse compared to the last time I saw him 2 weeks ago. In fact this is probably about 3 times the size it was then as far as the skin breakdown. There is also erythema and warmth and I am concerned about that as well. I think that the patient probably needs to go to the ER for further evaluation and treatment potentially IV antibiotics is what is can to be necessary here. Electronic Signature(s) Signed: 08/01/2021 3:26:17 PM By: Worthy Keeler PA-C Entered By: Worthy Keeler on 08/01/2021 15:26:17 [name]  ([id number]) -------------------------------------------------------------------------------- Physical Exam Details Patient Name: [name] Date of Service: 08/01/2021 2:45 PM Medical Record Number: [id number] Patient Account Number: [id number] Date of Birth/Sex: September 13, 1951 (70 y.o. M) Treating RN: Dolan Amen Primary Care Provider: Barney Drain Other Clinician: Referring Provider: Barney Drain Treating Provider/Extender: Jeri Cos Weeks in Treatment: 1 Constitutional Well-nourished and well-hydrated in no acute distress. Respiratory normal breathing without difficulty. Psychiatric this patient is able to make decisions and demonstrates good insight into disease process. Alert and Oriented x 3. pleasant and cooperative. Notes Upon evaluation today patient appears to be doing well only in regard to the fact that he is not having any signs of systemic infection at this point. However locally he does seem to have a significant amount and infection at this point. I do see evidence of slough and  biofilm buildup. We also did use the MolecuLight DX in order to image the wound today. It does show signs of both blush as well as cyan lighting up. Again this count confirms what were suspecting already but there is a significant infection and is not just around the center part of the wound but the periphery as well. Overall I think that the patient likely does need to go for evaluation in the ER to check this out. Electronic Signature(s) Signed: 08/01/2021 3:27:21 PM By: Worthy Keeler PA-C Entered By: Worthy Keeler on 08/01/2021 15:27:21 [name] ([id number]) -------------------------------------------------------------------------------- Physician Orders Details Patient Name: [name] Date of Service: 08/01/2021 2:45 PM Medical Record Number: [id number] Patient Account Number: [id number] Date of Birth/Sex: 12-15-1950 (70 y.o. 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 Follow-up Appointments o Return Appointment in: - Patient to follow up after hospital discharge Wound Treatment Wound #2 - Ankle Wound Laterality: Right, Medial Secondary Dressing: ABD Pad 5x9 (in/in) Discharge Instructions: Cover with ABD pad Secured With: Kerlix Roll Sterile or Non-Sterile 6-ply 4.5x4 (yd/yd) Discharge Instructions: Apply Kerlix as directed Notes Patient sent to ED for further evaluation and treatment regarding cellulitis Electronic Signature(s) Signed: 08/01/2021 4:52:35 PM By: Dolan Amen RN Signed: 08/01/2021 6:07:16 PM By: Worthy Keeler PA-C Entered By: Dolan Amen on 08/01/2021 15:21:41 [name] ([id number]) -------------------------------------------------------------------------------- Problem List Details Patient Name: [name] Date of Service: 08/01/2021 2:45 PM Medical Record Number: [id number] Patient Account Number: [id number] Date of Birth/Sex: 21-Jan-1951 (70 y.o. 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 M) Treating RN: Dolan Amen Primary Care Provider: Barney Drain Other Clinician: Referring Provider: Barney Drain Treating Provider/Extender: Skipper Cliche in Treatment: 1 Subjective Chief Complaint Information obtained from Patient Right LE Ulcer History of Present Illness (HPI) 12/21/2020 upon evaluation today patient appears to be doing somewhat poorly in regard to his right lower extremity. He does have some erythema and is also having issues with quite a bit of drainage currently from a wound that he tells me has been present since about 2 months ago when he initially had an injury that led to this wound beginning. He does have chronic venous stasis and is secondary to this I think it has been more difficult to heal this wound than normal. With that being said the patient tells me that this tends to drain a lot down onto his feet. He is also had some issues with  fungal infection he is currently on miconazole powder for that. Fortunately there is no signs of active infection systemically at this time. No fevers, chills, nausea, vomiting, or diarrhea. He does have pain but again this is more to the posterior aspect not really more lateral or anterior. There is evidence that he may have some local infection due to erythema and warmth surrounding the wound post debridement I do want to obtain a wound culture to evaluate and see if there is anything indeed of concern here. He did  have arterial studies which revealed that he had a ABI of 0.91 on the right with a TBI of 0.76 in the left his ABI was 1.18 with a TBI of 0.73. He has good blood flow here. In regard to his x-ray that was taken on 11/30/2020 there was no evidence of osseous abnormality. The arterial study was actually performed on 12/03/2020. The patient was hospitalized from 11/30/2020 through 12/02/2020 during that time he was on IV antibiotics including vancomycin and cefepime. 12/28/2020 upon evaluation today patient appears to be doing somewhat poorly in regard to his legs currently is having a lot of drainage unfortunately which again he was not actually being wrapped with a 3 layer compression wrap after this was changed at [facility]. Subsequently I think that the patient does need to have ongoing compression therapy. Right now that means that were doing the compression wrap later he will need compression stockings. Either way though I think that he cannot just have a border foam dressing on this which was what was on today that is not sufficient to catch any of the drainage and to be honest I think this can lead to infection and greater issues here for him. The wound is pretty much about circumferential at this point a weeping area included. 01/04/2021 upon evaluation today patient unfortunately is doing quite a bit worse in regard to the overall appearance of his wound from the standpoint of infection. I did see evidence of bright green drainage consistent with Pseudomonas which I discussed with the patient today as well as his representative from social services. Nonetheless I believe that we likely need to treat him for Pseudomonas right now the clindamycin he is on is not covering for this. With that being said no sharp debridement was performed today. Of note the patient's Hydrofera Blue dressing was not the ready transfer version and unfortunately did cause this did not drain quite as well as we would like to.  Nonetheless I do feel like that the patient would benefit from continuing with the wraps changed out their pace I think they can definitely do a good job and his swelling is under good control the big thing here is that we need to make sure that the dressing is applied appropriately we will contact them just to confirm and make sure that they know the appropriate way to apply the [facility] that they have. 01/11/2021 upon evaluation today patient appears to be doing somewhat better in regard to the erythema on his leg at this point. Fortunately there are no signs of active infection at this time. No fevers, chills, nausea, vomiting, or diarrhea. With that being said he has been tolerating the dressing changes without complication and very pleased in that regard. I do think that he seems to be making good progress in general which is good news. I think that the biggest issue I see right now is simply the [facility] that is being used cannot transfer through  to the [place] and therefore extracting a lot more fluid against the skin which is causing some irritation as well. I think that we may be able to wrap him here and get things in a better position for him to be honest. 01/18/2021 upon evaluation today patient appears to be doing better in regard to his leg. I feel like he is definitely making improvement and overall I think that this is significantly improved compared to the previous evaluation. With that being said there is still quite a ways to go to get this to heal and it is going to take quite a bit of time. Fortunately there is no evidence of active infection at this time. No fevers, chills, nausea, vomiting, or diarrhea. 01/25/2021 upon evaluation today patient appears to be doing well with regard to his leg. I feel like he is definitely showing signs of improvement which is great news. There is no evidence of active infection at this time. No fevers, chills, nausea, vomiting, or  diarrhea. 02/01/2021 upon evaluation today patient appears to be doing decently well in regard to his leg on the right as compared to previously noted. There does not appear to be any signs of active infection at this time which is great news and overall I am pleased that his systemically. Nonetheless locally still show signs of erythema he still has several days left of his antibiotic as apparently this was not started immediately after I wrote that for him last time. Nonetheless I think that he is taking the Levaquin now and hopefully its doing job to help clear things up. Nonetheless if he is not significantly better by next week I would likely repeat the culture to try to see if anything is changed as again he still has some erythema this week that has me concerned at this point. 02/07/2021 on evaluation today patient actually appears to be doing well with regard to his wounds in his far as size and overall appearance is concerned. He still having a tremendous amount of drainage she has now completed the Levaquin and unfortunately as opposed to improving the erythema actually seems to have worsened. Has been off the Levaquin for several days therefore I did obtain a new culture today. With that being said I nonetheless feel like that the patient is showing signs of ongoing issues with cellulitis and to be honest not only is he having issues on the right leg which is what we have been monitoring here but he now is having issues on the left leg as well unfortunately. This is even up and around the knee and again I think this is of significant concern to the point that I feel like he should go to the ER for further evaluation and treatment. 07/19/2021 upon evaluation today patient appears for reevaluation here in the clinic last time I saw him was actually March 2022. At that time I sent him to the hospital due to the fact that he had a significant infection. Subsequently upon discharge from the hospital  he was sent to a skilled [name], [name] ([id number]) nursing facility and I have not seen him since that time. Fortunately there does not appear to be any evidence of active infection at this time. No fevers, chills, nausea, vomiting, or diarrhea. With that being said I am somewhat concerned about the overall appearance of the wound bed. Now that it is consolidated to a much smaller area I feel like that the wound is somewhat irregular in the way  it appears. For that reason I really do think that we need to probably go ahead and see about doing a biopsy to test for the possibility of a skin cancer here. The patient is in agreement with that plan. 08/01/2021 upon evaluation today patient is actually seen for an early appointment that he was actually supposed to be here on Thursday he comes in 3 days early due to the fact that the provider at [facility] felt like he needed to be seen sooner due to the severity of the wound. It does indeed appear to be doing significantly worse compared to the last time I saw him 2 weeks ago. In fact this is probably about 3 times the size it was then as far as the skin breakdown. There is also erythema and warmth and I am concerned about that as well. I think that the patient probably needs to go to the ER for further evaluation and treatment potentially IV antibiotics is what is can to be necessary here. Objective Constitutional Well-nourished and well-hydrated in no acute distress. Vitals Time Taken: 2:55 PM, Weight: 180 lbs, Temperature: 98.7 F, Pulse: 68 bpm, Respiratory Rate: 18 breaths/min, Blood Pressure: 120/76 mmHg. Respiratory normal breathing without difficulty. Psychiatric this patient is able to make decisions and demonstrates good insight into disease process. Alert and Oriented x 3. pleasant and cooperative. General Notes: Upon evaluation today patient appears to be doing well only in regard to the fact that he is not having any signs of  systemic infection at this point. However locally he does seem to have a significant amount and infection at this point. I do see evidence of slough and biofilm buildup. We also did use the MolecuLight DX in order to image the wound today. It does show signs of both blush as well as cyan lighting up. Again this count confirms what were suspecting already but there is a significant infection and is not just around the center part of the wound but the periphery as well. Overall I think that the patient likely does need to go for evaluation in the ER to check this out. Integumentary (Hair, Skin) Wound #2 status is Open. Original cause of wound was Gradually Appeared. The date acquired was: 10/20/2020. The wound has been in treatment 1 weeks. The wound is located on the Right,Medial Ankle. The wound measures 10cm length x 9cm width x 0.1cm depth; 70.686cm^2 area and 7.069cm^3 volume. There is Fat Layer (Subcutaneous Tissue) exposed. There is no tunneling or undermining noted. There is a medium amount of serosanguineous drainage noted. There is medium (34-66%) red, hyper - granulation within the wound bed. There is a medium (34-66%) amount of necrotic tissue within the wound bed including Adherent Slough. 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  Would recommend currently that we go ahead and have the patient go to the ER for further evaluation and treatment I believe that he is likely getting need some IV antibiotic therapy at this point to get  this under control. Again his swelling is significantly improved since we saw him 2 weeks ago but unfortunately the size of the wound is not good and overall the biopsy was negative for any malignancy this was confirmed to be a stasis dermatitis type issue so essentially we been thinking of venous stasis ulcer. 2. With regard to the treatment I really feel like oral antibiotics are probably not can be sufficient in this case due to the severity of the infection I do believe the patient needs to be further evaluated in the ER. We will see the patient back for follow-up visit following the ER evaluation. MolecuLight Medical Necessity Wound shows signs of deterioration such as increased Fluorescence bacterial imaging was medically necessary today due to: drainage/slough/odor or an increase in size Wound(s) Scanned The following wound(s) were scanned with MolecuLight DX Right LE Ulcer MolecuLight Procedure The MolecularLight DX device was cleaned with a disinfectant wipe prior to use., The correct patient profile was confirmed and correct wound was verified., Range finder sensor used to ensure appropriate distance The following was completed: selected between imaging unit and wound bed, Room lights were turned off and the ambient light sensor was checked., The fluorescence icon was selected. Screen was tapped to enhance focus and the image was captured. Additional drapes were used to ensure adequate darknesso No MolecuLight Results Red Colors, Yellow Colors, Cyan Colors, Green Colors, There was no The results of todayos scan revealed: fluorescence suggestive of a high bacterial load on todayos scan. The indicated colors were noted in the following areas. In the periphery of the wound, In the  center of the wound As a result of todayos scan, the following treatment plans were put in Patient was sent to the ER for likely IV antibiotic therapy place. Potential ICD-10 Codes B96.5 pseudomonas (Cyan Color), 49.9 Bacterial infection unspecified ICD-10 (Red Color) Electronic Signature(s) Signed: 08/01/2021 3:30:38 PM By: Worthy Keeler PA-C Entered By: Worthy Keeler on 08/01/2021 15:30:38 [name] ([id number]) -------------------------------------------------------------------------------- SuperBill Details Patient Name: [name] Date of Service: 08/01/2021 Medical Record Number: [id number] Patient Account Number: [id number] Date of Birth/Sex: 04-27-1951 (70 y.o. M) Treating RN: Dolan Amen Primary Care Provider: Barney Drain Other Clinician: Referring Provider: Barney Drain Treating Provider/Extender: Skipper Cliche in Treatment: 1 Diagnosis Coding ICD-10 Codes Code Description I87.2 Venous insufficiency (chronic) (peripheral) L97.312 Non-pressure chronic ulcer of right ankle with fat layer exposed Z87.820 Personal history of traumatic brain injury F79 Unspecified intellectual disabilities Facility Procedures CPT4 Code: [id number] Description: 99213 - WOUND CARE VISIT-LEV 3 EST PT Modifier: Quantity: 1 Physician Procedures CPT4 Code: [id number] Description: [id number] - WC PHYS LEVEL 4 - EST PT Modifier: Quantity: 1 CPT4 Code: Description: ICD-10 Diagnosis Description I87.2 Venous insufficiency (chronic) (peripheral) L97.312 Non-pressure chronic ulcer of right ankle with fat layer exposed Z87.820 Personal history of traumatic brain injury F79 Unspecified intellectual  disabilities Modifier: Quantity: Electronic Signature(s) Signed: 08/01/2021 3:31:02 PM By: Worthy Keeler PA-C Entered By: Worthy Keeler on 08/01/2021 15:31:02

## 2021-08-01 NOTE — ED Provider Notes (Signed)
Emergency Medicine Provider Triage Evaluation Note  [name] , a 70 y.o. male  was evaluated in triage.    Patient was sent up here [facility].  He has had a chronic wound to his right lower leg secondary to peripheral vascular disease.  He was seen by the wound center where they noted that he had increasing swelling, redness, drainage to that leg.  Referred him to the emergency room for IV antibiotics.  Patient denies any fevers, chest pain, shortness of breath.  Review of Systems  Positive: Wound, erythema, ulcer, leg swelling Negative: Fever, chest pain, shortness of breath, abdominal pain  Physical Exam  BP 127/73 (BP Location: Left Arm)   Pulse 63   Temp 98.3 F (36.8 C) (Oral)   Resp 14   Ht '5\' 7"'$  (1.702 m)   Wt 81.6 kg   SpO2 99%   BMI 28.19 kg/m  Gen:   Awake, no distress  Resp:  Normal effort MSK:   Moves extremities without difficulty Other:  Right lower leg with edema.  Erythematous throughout lower leg into right heel.  Ulcerated large area noted to medial part of left lower leg.  Nonpurulent drainage noted.  Photo of area is posted below.     Medical Decision Making  Medically screening exam initiated at 7:01 PM.  Appropriate orders placed.  [name] was informed that the remainder of the evaluation will be completed by another provider, this initial triage assessment does not replace that evaluation, and the importance of remaining in the ED until their evaluation is complete.   Sheila Oats 08/01/21 Ilsa Iha    Dorie Rank, MD 08/03/21 [phone]

## 2021-08-01 NOTE — Progress Notes (Addendum)
[name] ([id number]) Visit Report for 08/01/2021 Arrival Information Details Patient Name: [name] Date of Service: 08/01/2021 2:45 PM Medical Record Number: [id number] Patient Account Number: [id number] Date of Birth/Sex: 1951/10/30 (70 y.o. Male) Treating RN: Dolan Amen Primary Care Navdeep Halt: Barney Drain Other Clinician: Referring Jocee Kissick: Barney Drain Treating Ehren Berisha/Extender: Skipper Cliche in Treatment: 1 Visit Information History Since Last Visit Pain Present Now: Yes Patient Arrived: Walker Arrival Time: 14:53 Accompanied By: self Transfer Assistance: None Patient Identification Verified: Yes Secondary Verification Process Completed: Yes Electronic Signature(s) Signed: 08/01/2021 4:52:35 PM By: Dolan Amen RN Entered By: Dolan Amen on 08/01/2021 14:55:04 [name] ([id number]) -------------------------------------------------------------------------------- Clinic Level of Care Assessment Details Patient Name: [name] Date of Service: 08/01/2021 2:45 PM Medical Record Number: [id number] Patient Account Number: [id number] Date of Birth/Sex: [birth year] (70 y.o. Male) Treating RN: Dolan Amen Primary Care Sharay Bellissimo: Barney Drain Other Clinician: Referring Gabriele Zwilling: Barney Drain Treating Cassanda Walmer/Extender: Skipper Cliche in Treatment: 1 Clinic Level of Care Assessment Items TOOL 4 Quantity Score X - Use when only an EandM is performed on FOLLOW-UP visit 1 0 ASSESSMENTS - Nursing Assessment / Reassessment X - Reassessment of Co-morbidities (includes updates in patient status) 1 10 X- 1 5 Reassessment of Adherence to Treatment Plan ASSESSMENTS - Wound and Skin Assessment / Reassessment X - Simple Wound Assessment / Reassessment - one wound 1 5 '[]'$  - 0 Complex Wound Assessment / Reassessment - multiple wounds X- 1 10 Dermatologic / Skin Assessment (not related to wound area) ASSESSMENTS - Focused  Assessment '[]'$  - Circumferential Edema Measurements - multi extremities 0 '[]'$  - 0 Nutritional Assessment / Counseling / Intervention '[]'$  - 0 Lower Extremity Assessment (monofilament, tuning fork, pulses) '[]'$  - 0 Peripheral Arterial Disease Assessment (using hand held doppler) ASSESSMENTS - Ostomy and/or Continence Assessment and Care '[]'$  - Incontinence Assessment and Management 0 '[]'$  - 0 Ostomy Care Assessment and Management (repouching, etc.) PROCESS - Coordination of Care X - Simple Patient / Family Education for ongoing care 1 15 '[]'$  - 0 Complex (extensive) Patient / Family Education for ongoing care '[]'$  - 0 Staff obtains Programmer, systems, Records, Test Results / Process Orders '[]'$  - 0 Staff telephones HHA, Nursing Homes / Clarify orders / etc X- 1 10 Routine Transfer to another Facility (non-emergent condition) '[]'$  - 0 Routine Hospital Admission (non-emergent condition) '[]'$  - 0 New Admissions / Biomedical engineer / Ordering NPWT, Apligraf, etc. '[]'$  - 0 Emergency Hospital Admission (emergent condition) X- 1 10 Simple Discharge Coordination '[]'$  - 0 Complex (extensive) Discharge Coordination PROCESS - Special Needs '[]'$  - Pediatric / Minor Patient Management 0 '[]'$  - 0 Isolation Patient Management '[]'$  - 0 Hearing / Language / Visual special needs '[]'$  - 0 Assessment of Community assistance (transportation, D/C planning, etc.) '[]'$  - 0 Additional assistance / Altered mentation '[]'$  - 0 Support Surface(s) Assessment (bed, cushion, seat, etc.) INTERVENTIONS - Wound Cleansing / Measurement [name]. ([id number]) X- 1 5 Simple Wound Cleansing - one wound '[]'$  - 0 Complex Wound Cleansing - multiple wounds X- 1 5 Wound Imaging (photographs - any number of wounds) '[]'$  - 0 Wound Tracing (instead of photographs) X- 1 5 Simple Wound Measurement - one wound '[]'$  - 0 Complex Wound Measurement - multiple wounds INTERVENTIONS - Wound Dressings '[]'$  - Small Wound Dressing one or multiple wounds  0 X- 1 15 Medium Wound Dressing one or multiple wounds '[]'$  - 0 Large Wound Dressing one or multiple wounds '[]'$  - 0 Application  of Medications - topical '[]'$  - 0 Application of Medications - injection INTERVENTIONS - Miscellaneous '[]'$  - External ear exam 0 '[]'$  - 0 Specimen Collection (cultures, biopsies, blood, body fluids, etc.) '[]'$  - 0 Specimen(s) / Culture(s) sent or taken to Lab for analysis '[]'$  - 0 Patient Transfer (multiple staff / Civil Service fast streamer / Similar devices) '[]'$  - 0 Simple Staple / Suture removal (25 or less) '[]'$  - 0 Complex Staple / Suture removal (26 or more) '[]'$  - 0 Hypo / Hyperglycemic Management (close monitor of Blood Glucose) '[]'$  - 0 Ankle / Brachial Index (ABI) - do not check if billed separately X- 1 5 Vital Signs Has the patient been seen at the hospital within the last three years: Yes Total Score: 100 Level Of Care: New/Established - Level 3 Electronic Signature(s) Signed: 08/01/2021 4:52:35 PM By: Dolan Amen RN Entered By: Dolan Amen on 08/01/2021 15:29:02 [name] ([id number]) -------------------------------------------------------------------------------- Encounter Discharge Information Details Patient Name: [name] Date of Service: 08/01/2021 2:45 PM Medical Record Number: [id number] Patient Account Number: [id number] Date of Birth/Sex: 12-23-1950 (70 y.o. Male) Treating RN: Dolan Amen Primary Care Ed Rayson: Barney Drain Other Clinician: Referring Edee Nifong: Barney Drain Treating Kooper Godshall/Extender: Skipper Cliche in Treatment: 1 Encounter Discharge Information Items Discharge Condition: Stable Ambulatory Status: Walker Discharge Destination: Emergency Room Orders Sent: Yes Transportation: Private Auto Accompanied By: self Schedule Follow-up Appointment: No Clinical Summary of Care: Electronic Signature(s) Signed: 08/01/2021 4:51:06 PM By: Dolan Amen RN Entered By: Dolan Amen on 08/01/2021  16:51:06 [name] ([id number]) -------------------------------------------------------------------------------- Lower Extremity Assessment Details Patient Name: [name] Date of Service: 08/01/2021 2:45 PM Medical Record Number: [id number] Patient Account Number: [id number] Date of Birth/Sex: 1950/12/17 (70 y.o. Male) Treating RN: Dolan Amen Primary Care Tattianna Schnarr: Barney Drain Other Clinician: Referring Shant Hence: Barney Drain Treating Jahaan Vanwagner/Extender: Jeri Cos Weeks in Treatment: 1 Edema Assessment Assessed: [Left: No] [Right: Yes] Edema: [Left: Ye] [Right: s] Calf Left: Right: Point of Measurement: 34 cm From Medial Instep 33.5 cm Ankle Left: Right: Point of Measurement: 12 cm From Medial Instep 26.5 cm Electronic Signature(s) Signed: 08/01/2021 4:52:35 PM By: Dolan Amen RN Entered By: Dolan Amen on 08/01/2021 15:07:49 [name] ([id number]) -------------------------------------------------------------------------------- Multi Wound Chart Details Patient Name: [name] Date of Service: 08/01/2021 2:45 PM Medical Record Number: [id number] Patient Account Number: [id number] Date of Birth/Sex: 11-04-1951 (70 y.o. Male) Treating RN: Dolan Amen Primary Care Haruna Rohlfs: Barney Drain Other Clinician: Referring Creta Dorame: Barney Drain Treating Evetta Renner/Extender: Skipper Cliche in Treatment: 1 Vital Signs Height(in): Pulse(bpm): 29 Weight(lbs): 180 Blood Pressure(mmHg): 120/76 Body Mass Index(BMI): Temperature(F): 98.7 Respiratory Rate(breaths/min): 18 Photos: [N/A:N/A] Wound Location: Right, Medial Ankle N/A N/A Wounding Event: Gradually Appeared N/A N/A Primary Etiology: Venous Leg Ulcer N/A N/A Comorbid History: Asthma, Hypertension, History of N/A N/A pressure wounds, Neuropathy Date Acquired: 10/20/2020 N/A N/A Weeks of Treatment: 1 N/A N/A Wound Status: Open N/A N/A Measurements L x W x D (cm)  10x9x0.1 N/A N/A Area (cm) : 70.686 N/A N/A Volume (cm) : 7.069 N/A N/A % Reduction in Area: -378.70% N/A N/A % Reduction in Volume: -139.40% N/A N/A Classification: Full Thickness Without Exposed N/A N/A Support Structures Exudate Amount: Medium N/A N/A Exudate Type: Serosanguineous N/A N/A Exudate Color: red, brown N/A N/A Granulation Amount: Medium (34-66%) N/A N/A Granulation Quality: Red, Hyper-granulation N/A N/A Necrotic Amount: Medium (34-66%) N/A N/A Exposed Structures: Fat Layer (Subcutaneous Tissue): N/A N/A Yes Fascia: No Tendon: No Muscle: No Joint:  No Bone: No Epithelialization: Small (1-33%) N/A N/A Treatment Notes Electronic Signature(s) Signed: 08/01/2021 4:52:35 PM By: Dolan Amen RN Entered By: Dolan Amen on 08/01/2021 15:17:13 [name] ([id number]) -------------------------------------------------------------------------------- [place] Details Patient Name: [name] Date of Service: 08/01/2021 2:45 PM Medical Record Number: [id number] Patient Account Number: [id number] Date of Birth/Sex: 21-Sep-1951 (70 y.o. Male) Treating RN: Dolan Amen Primary Care Leyland Kenna: Barney Drain Other Clinician: Referring Durk Carmen: Barney Drain Treating Jazmarie Biever/Extender: Skipper Cliche in Treatment: 1 Active Inactive Electronic Signature(s) Signed: 08/24/2021 11:13:26 AM By: Gretta Cool, BSN, RN, CWS, Kim RN, BSN Signed: 09/09/2021 5:06:58 PM By: Dolan Amen RN Previous Signature: 08/01/2021 4:52:35 PM Version By: Dolan Amen RN Entered By: Gretta Cool BSN, RN, CWS, Kim on 08/24/2021 11:13:26 [name] ([id number]) -------------------------------------------------------------------------------- Pain Assessment Details Patient Name: [name] Date of Service: 08/01/2021 2:45 PM Medical Record Number: [id number] Patient Account Number: [id number] Date of Birth/Sex: 10-03-1951 (70 y.o. Male) Treating RN:  Dolan Amen Primary Care Cheralyn Oliver: Barney Drain Other Clinician: Referring Kanya Potteiger: Barney Drain Treating Asiah Browder/Extender: Jeri Cos Weeks in Treatment: 1 Active Problems Location of Pain Severity and Description of Pain Patient Has Paino Yes Site Locations Pain Location: Pain in Ulcers Rate the pain. Current Pain Level: 10 Pain Management and Medication Current Pain Management: Electronic Signature(s) Signed: 08/01/2021 4:52:35 PM By: Dolan Amen RN Entered By: Dolan Amen on 08/01/2021 14:57:27 [name] ([id number]) -------------------------------------------------------------------------------- Patient/Caregiver Education Details Patient Name: [name] Date of Service: 08/01/2021 2:45 PM Medical Record Number: [id number] Patient Account Number: [id number] Date of Birth/Gender: [birth year] (70 y.o. Male) Treating RN: Dolan Amen Primary Care Physician: Barney Drain Other Clinician: Referring Physician: Barney Drain Treating Physician/Extender: Skipper Cliche in Treatment: 1 Education Assessment Education Provided To: Patient Education Topics Provided Infection: Methods: Explain/Verbal Responses: State content correctly Electronic Signature(s) Signed: 08/02/2021 7:11:16 PM By: Gretta Cool, BSN, RN, CWS, Kim RN, BSN Previous Signature: 08/01/2021 4:52:35 PM Version By: Dolan Amen RN Entered By: Gretta Cool, BSN, RN, CWS, Kim on 08/02/2021 10:27:37 [name] ([id number]) -------------------------------------------------------------------------------- Wound Assessment Details Patient Name: [name] Date of Service: 08/01/2021 2:45 PM Medical Record Number: [id number] Patient Account Number: [id number] Date of Birth/Sex: [birth year] (70 y.o. Male) Treating RN: Dolan Amen Primary Care Breely Panik: Barney Drain Other Clinician: Referring Raquon Milledge: Barney Drain Treating Atley Scarboro/Extender: Jeri Cos Weeks in  Treatment: 1 Wound Status Wound Number: 2 Primary Venous Leg Ulcer Etiology: Wound Location: Right, Medial Ankle Wound Status: Open Wounding Event: Gradually Appeared Comorbid Asthma, Hypertension, History of pressure wounds, Date Acquired: 10/20/2020 History: Neuropathy Weeks Of Treatment: 1 Clustered Wound: No Photos Wound Measurements Length: (cm) 10 Width: (cm) 9 Depth: (cm) 0.1 Area: (cm) 70.686 Volume: (cm) 7.069 % Reduction in Area: -378.7% % Reduction in Volume: -139.4% Epithelialization: Small (1-33%) Tunneling: No Undermining: No Wound Description Classification: Full Thickness Without Exposed Support Structu ARAM, CADA ([id number]) res Foul Odor After Cleansing: No Exudate Amount: Medium Slough/Fibrino Yes Exudate Type: Serosanguineous Exudate Color: red, brown Wound Bed Granulation Amount: Medium (34-66%) Exposed Structure Granulation Quality: Red, Hyper-granulation Fascia Exposed: No Necrotic Amount: Medium (34-66%) Fat Layer (Subcutaneous Tissue) Exposed: Yes Necrotic Quality: Adherent Slough Tendon Exposed: No Muscle Exposed: No Joint Exposed: No Bone Exposed: No Electronic Signature(s) Signed: 08/01/2021 5:11:12 PM By: Gretta Cool, BSN, RN, CWS, Kim RN, BSN Signed: 08/02/2021 5:00:35 PM By: Dolan Amen RN Previous Signature: 08/01/2021 5:10:22 PM Version By: Gretta Cool, BSN, RN, CWS, Kim RN, BSN Previous Signature: 08/01/2021 5:09:08 PM  Version By: Gretta Cool, BSN, RN, CWS, Kim RN, BSN Previous Signature: 08/01/2021 5:07:36 PM Version By: Gretta Cool BSN, RN, CWS, Kim RN, BSN Previous Signature: 08/01/2021 4:52:35 PM Version By: Dolan Amen RN Entered By: Gretta Cool BSN, RN, CWS, Kim on 08/01/2021 17:11:11 [name] ([id number]) -------------------------------------------------------------------------------- [place] Details Patient Name: [name] Date of Service: 08/01/2021 2:45 PM Medical Record Number: [id number] Patient Account Number:  [id number] Date of Birth/Sex: 11-20-1951 (70 y.o. Male) Treating RN: Dolan Amen Primary Care Thailan Sava: Barney Drain Other Clinician: Referring Rey Fors: Barney Drain Treating Annet Manukyan/Extender: Jeri Cos Weeks in Treatment: 1 Vital Signs Time Taken: 14:55 Temperature (F): 98.7 Weight (lbs): 180 Pulse (bpm): 68 Respiratory Rate (breaths/min): 18 Blood Pressure (mmHg): 120/76 Reference Range: 80 - 120 mg / dl Electronic Signature(s) Signed: 08/01/2021 4:52:35 PM By: Dolan Amen RN Entered By: Dolan Amen on 08/01/2021 14:57:12

## 2021-08-01 NOTE — ED Triage Notes (Signed)
Pt arrived POV was sent by wound care provider to have his foot wound evaluated. Provider referred pt here for IV antibiotics   Pt states increased pain and notes from provider states wound has tripled in size in 2wks

## 2021-08-02 ENCOUNTER — Encounter (HOSPITAL_COMMUNITY): Payer: Self-pay | Admitting: Emergency Medicine

## 2021-08-02 ENCOUNTER — Other Ambulatory Visit: Payer: Self-pay

## 2021-08-02 LAB — SEDIMENTATION RATE: Sed Rate: 22 mm/hr — ABNORMAL HIGH (ref 0–16)

## 2021-08-02 LAB — C-REACTIVE PROTEIN: CRP: 4.3 mg/dL — ABNORMAL HIGH (ref <1.0)

## 2021-08-02 LAB — MRSA NEXT GEN BY PCR, NASAL: MRSA by PCR Next Gen: NOT DETECTED

## 2021-08-02 MED ORDER — SODIUM CHLORIDE 0.9 % IV SOLN
2.0000 g | Freq: Three times a day (TID) | INTRAVENOUS | Status: DC
  Administered 2021-08-02 – 2021-08-03 (×3): 2 g via INTRAVENOUS
  Filled 2021-08-02 (×3): qty 2

## 2021-08-02 MED ORDER — SODIUM CHLORIDE 0.9 % IV SOLN
2.0000 g | Freq: Once | INTRAVENOUS | Status: AC
  Administered 2021-08-02: 2 g via INTRAVENOUS
  Filled 2021-08-02: qty 2

## 2021-08-02 MED ORDER — VANCOMYCIN HCL 1750 MG/350ML IV SOLN
1750.0000 mg | Freq: Once | INTRAVENOUS | Status: AC
  Administered 2021-08-02: 1750 mg via INTRAVENOUS
  Filled 2021-08-02: qty 350

## 2021-08-02 MED ORDER — ACETAMINOPHEN 650 MG RE SUPP: 650.0000 mg | Freq: Four times a day (QID) | RECTAL | Status: DC | PRN

## 2021-08-02 MED ORDER — ONDANSETRON HCL 4 MG/2ML IJ SOLN: 4.0000 mg | Freq: Four times a day (QID) | INTRAMUSCULAR | Status: DC | PRN

## 2021-08-02 MED ORDER — POLYETHYLENE GLYCOL 3350 17 G PO PACK: 17.0000 g | PACK | Freq: Every day | ORAL | Status: DC | PRN

## 2021-08-02 MED ORDER — SODIUM CHLORIDE 0.9% FLUSH
3.0000 mL | Freq: Two times a day (BID) | INTRAVENOUS | Status: DC
  Administered 2021-08-02 – 2021-08-05 (×7): 3 mL via INTRAVENOUS

## 2021-08-02 MED ORDER — ACETAMINOPHEN 325 MG PO TABS
650.0000 mg | ORAL_TABLET | Freq: Four times a day (QID) | ORAL | Status: DC | PRN
  Administered 2021-08-02 – 2021-08-05 (×7): 650 mg via ORAL
  Filled 2021-08-02 (×8): qty 2

## 2021-08-02 MED ORDER — ENOXAPARIN SODIUM 40 MG/0.4ML IJ SOSY
40.0000 mg | PREFILLED_SYRINGE | INTRAMUSCULAR | Status: DC
  Administered 2021-08-02 – 2021-08-05 (×4): 40 mg via SUBCUTANEOUS
  Filled 2021-08-02 (×4): qty 0.4

## 2021-08-02 MED ORDER — VANCOMYCIN HCL IN DEXTROSE 1-5 GM/200ML-% IV SOLN
1000.0000 mg | Freq: Two times a day (BID) | INTRAVENOUS | Status: DC
  Administered 2021-08-03: 1000 mg via INTRAVENOUS
  Filled 2021-08-02 (×4): qty 200

## 2021-08-02 MED ORDER — ONDANSETRON HCL 4 MG PO TABS: 4.0000 mg | ORAL_TABLET | Freq: Four times a day (QID) | ORAL | Status: DC | PRN

## 2021-08-02 NOTE — Consult Note (Signed)
[place] Nurse wound consult note Consultation was completed by review of records, images and assistance from the bedside nurse/clinical staff.   Patient is followed by the [facility] center. He was sent to the ED for evaluation for antibiotic therapy based on suspect of wound/clinical infection Reason for Consult: venous stasis Wound type: venous stasis; chronic  Pressure Injury POA: NA Measurement:10cm length x 9cm width x 0.1cm depth Wound bed: per [facility] notes; There is no tunneling or undermining noted. There is a medium amount of serosanguineous drainage noted. There is medium (34-66%) red, hyper - granulation within the wound bed. There is a medium (34-66%) amount of necrotic tissue within the wound bed including Adherent Slough. Drainage (amount, consistency, odor) noted serosanguinous in images reviewed  Periwound: edema; erythema  Dressing procedure/placement/frequency:  Cut to fit silver hydrofiber (Aquacel Ag+)-lawson # [id number]. Cover with silicone foam. Wrap from toes to knees with kerlix and ACE wrap while inpatient. Change daily.   Return to wound care center as scheduled after DC from inpatient stay.   Discussed POC with patient and bedside nurse.  Re consult if needed, will not follow at this time. Thanks  Raheem Kolbe [employer], RN,CWOCN, CNS, [place] [phone])

## 2021-08-02 NOTE — ED Notes (Signed)
Help get patient undressed on the monitor patient is resting with call bell in reach got patient a warm blanket

## 2021-08-02 NOTE — H&P (Signed)
Date: 08/02/2021               Patient Name:  [name] MRN: [id number]  DOB: Apr 09, 1951 Age / Sex: 70 y.o., male   PCP: Perry Adie, MD         Medical Service: Internal Medicine Teaching Service         Attending Physician: Dr. Pattricia Boss, MD    First Contact: Perry Brittle, DO Pager: See amion  Second Contact: Perry Pais, MD Pager: Rudean Curt [phone]       After Hours (After 5p/  First Contact Pager: [phone]  weekends / holidays): Second Contact Pager: [phone]   SUBJECTIVE  Chief Complaint: Lower extremity wound  History of Present Illness: [name] is a 70 y.o. 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   Patient history was limited by his underlying TBI with cognitive impairment.  Much of the history was obtained through chart review.  He was initially evaluated for this problem back in 11/2020 during a hospital admission for right lower extremity cellulitis. He had x-rays performed that were within normal limits and vascular ABIs showing mild PAD in the right lower extremity.  He received IV antibiotics with vancomycin and cefepime and was discharged with follow-up at the wound care clinic.  Since then the patient has been seen multiple times at the wound care center with intermittent improvement of his wound.  Patient was rehospitalized back in 01/2021 for ongoing cellulitis with significant drainage.  Since that time the patient's wound seem to continue to worsen.  He had a biopsy performed on 06/2021 due to concern for possible underlying skin cancer in the affected area that resulted as stasis dermatitis.  Patient had a follow-up on 07/2021 with enlargement of the wound, increased redness, and warmth noted. AS a result, he was instructed to go to the  ED for further evaluation and management.  Based on the wound care notes, is difficult to determine if  he has received any antibiotics within the last few weeks leading up to this admission.  Patient states that he has pain in that right lower extremity with cold sensation.  He denies any fevers, chills, myalgias, night sweats. He does endorse difficulty walking on the affected foot due to pain. Patient states that he lives at home alone but is [facility].   Medications: No current facility-administered medications on file prior to encounter.   Current Outpatient Medications on File Prior to Encounter  Medication Sig Dispense Refill   acetaminophen (TYLENOL) 500 MG tablet Take 500 mg by mouth every 8 (eight) hours as needed for headache or mild pain.     albuterol (PROVENTIL HFA;VENTOLIN HFA) 108 (90 BASE) MCG/ACT inhaler Inhale 2 puffs into the lungs every 4 (four) hours as needed for wheezing. (Patient taking differently: Inhale 1 puff into the lungs every 4 (four) hours as needed for wheezing.) 1 Inhaler 0   Alpha-Lipoic Acid 600 MG CAPS Take 600 mg by mouth in the morning and at bedtime.     atorvastatin (LIPITOR) 20 MG tablet Take 20 mg by mouth daily.     betamethasone valerate (VALISONE) 0.1 % cream Apply 1 application topically 2 (two) times daily. For scalp     budesonide-formoterol (SYMBICORT) 160-4.5 MCG/ACT inhaler Inhale 2 puffs into the lungs 2 (two) times daily.  chlorhexidine (PERIDEX) 0.12 % solution Use as directed 15 mLs in the mouth or throat 2 (two) times daily.     fexofenadine (ALLEGRA) 180 MG tablet Take 180 mg by mouth daily.     fluticasone (FLONASE) 50 MCG/ACT nasal spray Place 1 spray into both nostrils in the morning and at bedtime.     hydrocortisone 2.5 % cream Apply 1 application topically daily. To Face     ketoconazole (NIZORAL) 2 % shampoo Apply 1 application topically See admin instructions. Wash scalp,beard,mustache area 2-3 times a week     MELOXICAM PO Take 7.5 mg by mouth daily as needed (pain).     Menthol, Topical Analgesic,  (BIOFREEZE ROLL-ON EX) Apply 1 application topically 4 (four) times daily as needed (joint pain).     miconazole (MICOTIN) 2 % powder Apply 1 application topically daily. For feet     nystatin cream (MYCOSTATIN) Apply 1 application topically 3 (three) times daily. For groin area     polyethylene glycol (MIRALAX / GLYCOLAX) 17 g packet Take 17 g by mouth 3 (three) times a week. Mon, Wed, Fri     simethicone (MYLICON) 80 MG chewable tablet Chew 80 mg by mouth 4 (four) times daily as needed for flatulence. After meals and at bedtime as needed for gas     Sodium Fluoride (PREVIDENT 5000 BOOSTER PLUS) 1.1 % PSTE Place 1 application onto teeth at bedtime.     sulfamethoxazole-trimethoprim (BACTRIM DS) 800-160 MG tablet Take 1 tablet by mouth every 12 (twelve) hours. 12 tablet 0   tamsulosin (FLOMAX) 0.4 MG CAPS capsule Take 0.4 mg by mouth daily.     Vitamin D, Ergocalciferol, (DRISDOL) 1.25 MG (50000 UNIT) CAPS capsule Take 50,000 Units by mouth every 30 (thirty) days.      Past Medical History:  Past Medical History:  Diagnosis Date   Alcohol abuse    Arthritis    Asthma    Brain tumor ([place])    Bronchitis    GERD (gastroesophageal reflux disease)    Hypertension    Hypertensive retinopathy    OU   Obesity    Traumatic brain injury ([place])    Varicose veins     Social:  Lives -[place] Occupation - disability Support -unknown, [facility] patient Level of function -unknown PCP - [facility] Substance use - unknown  Family History: Family History  Problem Relation Age of Onset   Diabetes Father     Allergies: Allergies as of 08/01/2021   (No Known Allergies)    Review of Systems: A complete ROS was negative except as per HPI.   OBJECTIVE:  Physical Exam: Blood pressure 119/73, pulse (!) 58, temperature 98.4 F (36.9 C), temperature source Oral, resp. rate 20, height _0  (1.702 m), weight 81.6 kg, SpO2 100 %. Physical Exam Constitutional:      General: He  is not in acute distress.    Appearance: He is not ill-appearing.  HENT:     Head: Normocephalic and atraumatic.  Eyes:     Extraocular Movements: Extraocular movements intact.  Cardiovascular:     Pulses: Normal pulses.     Heart sounds: Normal heart sounds.  Pulmonary:     Effort: Pulmonary effort is normal.     Breath sounds: Normal breath sounds.  Abdominal:     General: Abdomen is flat. There is no distension.     Palpations: Abdomen is soft.     Tenderness: There is no abdominal tenderness.  Musculoskeletal:  General: Swelling (right lower extremity > left lower extremity) present.     Cervical back: Normal range of motion.  Skin:    Findings: Erythema and lesion (ulcer on the medical aspect of RLE) present.     Comments: Ulcer without purulent drainage, surrounding erythema and induration without focal fluctuance.  There is surrounding warmth.  Wound edges appear well-defined without maceration in the wound bases red without eschar  Neurological:     Mental Status: He is alert. Mental status is at baseline.     Comments: Known cognitive deficits  Psychiatric:        Cognition and Memory: Cognition is impaired.       Pertinent Labs: CBC    Component Value Date/Time   WBC 6.6 08/01/2021 1909   RBC 3.76 (L) 08/01/2021 1909   HGB 10.9 (L) 08/01/2021 1909   HCT 34.6 (L) 08/01/2021 1909   PLT 229 08/01/2021 1909   MCV 92.0 08/01/2021 1909   MCH 29.0 08/01/2021 1909   MCHC 31.5 08/01/2021 1909   RDW 14.3 08/01/2021 1909   LYMPHSABS 1.1 08/01/2021 1909   MONOABS 0.8 08/01/2021 1909   EOSABS 0.3 08/01/2021 1909   BASOSABS 0.0 08/01/2021 1909     CMP     Component Value Date/Time   NA 136 08/01/2021 1909   K 3.7 08/01/2021 1909   CL 104 08/01/2021 1909   CO2 25 08/01/2021 1909   GLUCOSE 93 08/01/2021 1909   BUN 14 08/01/2021 1909   CREATININE 0.89 08/01/2021 1909   CALCIUM 8.7 (L) 08/01/2021 1909   PROT 6.2 (L) 02/07/2021 1641   ALBUMIN 3.3 (L)  02/07/2021 1641   AST 19 02/07/2021 1641   ALT 14 02/07/2021 1641   ALKPHOS 46 02/07/2021 1641   BILITOT 0.5 02/07/2021 1641   GFRNONAA >60 08/01/2021 1909   GFRAA >90 01/03/2015 2145    Pertinent Imaging: DG Tibia/Fibula Right  Result Date: 08/01/2021 CLINICAL DATA:  Necrotic wound. EXAM: RIGHT TIBIA AND FIBULA - 2 VIEW COMPARISON:  February 10, 2021 FINDINGS: There is no evidence of fracture or dislocation. A moderate-sized plantar calcaneal spur is seen. Moderate severity diffuse soft tissue swelling is seen within the region of the right ankle. No soft tissue air is identified. IMPRESSION: Moderate severity diffuse soft tissue swelling within the region of the right ankle. Electronically Signed   By: Virgina Norfolk M.D.   On: 08/01/2021 19:55    EKG: personally reviewed my interpretation is normal EKG, normal sinus rhythm.   ASSESSMENT & PLAN:  Assessment: Active Problems:   * No active hospital problems. *   BAKARI NIKOLAI is a 69 y.o. 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  There is obvious extension of erythema from the surrounding lesion with warmth consistent with nonpurulent cellulitis. He was started on vancomycin and cefepime in the ED. x-ray of the affected limb shows moderate severity diffuse soft tissue swelling without gas or evidence of osteomyelitis.  Patient does not have any systemic signs of infection or leukocytosis.  He has no history of medical conditions that put him at risk for immunocompromised state.  He does not have a history of diabetes but does  have a history of Pseudomonas positive  wound culture in 11/2020.  Unsure when the patient's last antibiotic use was, but it appears that he has had multiple rounds of oral antibiotics in the outpatient setting putting him at risk from MRSA infection. Furthermore, he does have a significant history of progression of his wound and erythema despite outpatient therapy. His treatment course has been complicated by his history of TBI with concern for difficulty managing his wound care at home. Therefore, I agree that the patient should be admitted for IV antibiotics and based on his risk factors will need coverage for MRSA and Pseudomonas initially with plans to de-escalate in the near future.  I will consult wound care for additional recommendation regarding this treatment.  -Continue IV antibiotics with vancomycin and cefepime - De-escalate antibiotics in near future -MRSA nasal PCR -Blood cultures pending -ESR and CRP pending -Wound care consult pending -PT/OT evaluation  #Hx of TBI with cognitive impairment Patient has a history of TBI with cognitive impairment.  [facility] but states that he lives at home by himself.  Therefore I will consult social work for additional resources at home.   Best Practice: Diet: Regular diet IVF: none VTE:   Enoxaparin Code: Full AB: Vanco/cefepime Status: Observation with expected length of stay less than 2 midnights. Anticipated Discharge Location:  TBD Barriers to Discharge: IV antibiotics  Signature: Lawerance Cruel, D.O.  Internal Medicine Resident, PGY-3 [facility]  Pager: [phone] 10:00 AM, 08/02/2021   Please contact the on call pager after 5 pm and on weekends at [phone].

## 2021-08-02 NOTE — ED Notes (Signed)
Pt care taken alert and oriented, helped back in bed

## 2021-08-02 NOTE — ED Notes (Addendum)
I introduced myself to pt. Pt AxO x4. GCS 15. Pt given a warm blanket. C/o leg pain. Given tylenol. Denies further needs. Pt ate 100% of meal tray.

## 2021-08-02 NOTE — ED Notes (Addendum)
Pt resting in bed watching. Denies needs. Worried if he needs to have a BM that he won't be able to go to the restroom in time so [facility] found for him. Pt denies further needs. Call light in reach and railings up. I updated pt's living center.

## 2021-08-02 NOTE — Hospital Course (Addendum)
TBA  More imaging Labs - bcx? Antibiotics

## 2021-08-02 NOTE — ED Provider Notes (Signed)
[facility] EMERGENCY DEPARTMENT Provider Note   CSN: [id number] Arrival date & time: 08/01/21  1619     History Chief Complaint  Patient presents with   Wound Check    [name] is a 70 y.o. male.  HPI Level 5 caveat patient is poor historian secondary to TBI. history was attempted to obtain from the patient, however is unclear how accurate this information is Patient has a note from the wound care [facility] center which was reference 70 year old man ho MR, TBI presents today after being sent from the wound care center he has had ongoing treatment for wound of his right lower extremity.  Patient states the wound has been present for 20 years.  Wound care clinic notes begin 2/1/22when it was stated the wound had been present for 2 months. I reviewed their notes which states that he presented 3 days early this round due to his provider [facility] felt like he needs to be sooner due to the severity of the wound.  They noted that the wound was doing significantly worse compared to 2 weeks previously.  They noted that it was larger, increased skin breakdown, erythema and warmth around it.  They sent the patient to the ED for evaluation.  Patient has not noted any fever, chills, nausea, vomiting, diarrhea.  He endorses that it is more painful and is larger than it has been.  He denies any recent new injury.  Prior to my evaluation he had CBC, chemistry, lactic acid, and x-Ashlea Dusing of the right tib-fib performed.  White blood cell count is normal.  Patient is anemic but stable from prior.  He has mild hypocalcemia but otherwise basic metabolic panel is normal.  His lactic acid is 0.8.  Tib-fib is significant for soft tissue swelling but no evidence of gas-forming organism represented by air, or osteomyelitis noted     Past Medical History:  Diagnosis Date   Alcohol abuse    Arthritis    Asthma    Brain tumor ([place])    Bronchitis    GERD (gastroesophageal  reflux disease)    Hypertension    Hypertensive retinopathy    OU   Obesity    Traumatic brain injury ([place])    Varicose veins     Patient Active Problem List   Diagnosis Date Noted   Cellulitis 02/08/2021   Right Lower Extremity Wound infection 12/01/2020   Pressure injury of skin 12/01/2020   Varicose veins of right lower extremity with complications [id number]   Dyspnea 05/13/2015   TBI (traumatic brain injury) ([place]) 10/08/2014   Mental retardation 10/08/2014   HTN (hypertension) 10/08/2014    Past Surgical History:  Procedure Laterality Date   CATARACT EXTRACTION Bilateral 2018   Dr. Herbert Deaner   EYE SURGERY Bilateral 2018   Cat Sx - Dr. Herbert Deaner   HEMORRHOID SURGERY         Family History  Problem Relation Age of Onset   Diabetes Father     Social History   Tobacco Use   Smoking status: Never   Smokeless tobacco: Never  Vaping Use   Vaping Use: Never used  Substance Use Topics   Alcohol use: Yes    Alcohol/week: 0.0 standard drinks    Comment: Usually drinks everyday. 2-3 20oz beers a day. Last drink: Tonight   Drug use: No    Home Medications Prior to Admission medications   Medication Sig Start Date End Date Taking? Authorizing Provider  acetaminophen (TYLENOL) 500 MG tablet Take 500 mg by mouth every 8 (eight) hours as needed for headache or mild pain. 06/14/15   [provider]  albuterol (PROVENTIL HFA;VENTOLIN HFA) 108 (90 BASE) MCG/ACT inhaler Inhale 2 puffs into the lungs every 4 (four) hours as needed for wheezing. Patient taking differently: Inhale 1 puff into the lungs every 4 (four) hours as needed for wheezing. 11/30/11 01/03/15  Samuella Cota, MD  Alpha-Lipoic Acid 600 MG CAPS Take 600 mg by mouth in the morning and at bedtime.    [provider]  atorvastatin (LIPITOR) 20 MG tablet Take 20 mg by mouth daily.    [provider]  betamethasone valerate (VALISONE) 0.1 % cream Apply 1 application topically 2 (two) times  daily. For scalp    [provider]  budesonide-formoterol (SYMBICORT) 160-4.5 MCG/ACT inhaler Inhale 2 puffs into the lungs 2 (two) times daily.    [provider]  chlorhexidine (PERIDEX) 0.12 % solution Use as directed 15 mLs in the mouth or throat 2 (two) times daily.    [provider]  fexofenadine (ALLEGRA) 180 MG tablet Take 180 mg by mouth daily.    [provider]  fluticasone (FLONASE) 50 MCG/ACT nasal spray Place 1 spray into both nostrils in the morning and at bedtime.    [provider]  hydrocortisone 2.5 % cream Apply 1 application topically daily. To Face    [provider]  ketoconazole (NIZORAL) 2 % shampoo Apply 1 application topically See admin instructions. Wash scalp,beard,mustache area 2-3 times a week    [provider]  MELOXICAM PO Take 7.5 mg by mouth daily as needed (pain).    [provider]  Menthol, Topical Analgesic, (BIOFREEZE ROLL-ON EX) Apply 1 application topically 4 (four) times daily as needed (joint pain).    [provider]  miconazole (MICOTIN) 2 % powder Apply 1 application topically daily. For feet    [provider]  nystatin cream (MYCOSTATIN) Apply 1 application topically 3 (three) times daily. For groin area    [provider]  polyethylene glycol (MIRALAX / GLYCOLAX) 17 g packet Take 17 g by mouth 3 (three) times a week. Mon, Wed, Fri    [provider]  simethicone (MYLICON) 80 MG chewable tablet Chew 80 mg by mouth 4 (four) times daily as needed for flatulence. After meals and at bedtime as needed for gas    [provider]  Sodium Fluoride (PREVIDENT 5000 BOOSTER PLUS) 1.1 % PSTE Place 1 application onto teeth at bedtime.    [provider]  sulfamethoxazole-trimethoprim (BACTRIM DS) 800-160 MG tablet Take 1 tablet by mouth every 12 (twelve) hours. 02/11/21   Cato Mulligan, MD  tamsulosin (FLOMAX) 0.4 MG CAPS capsule Take  0.4 mg by mouth daily. 02/23/16   [provider]  Vitamin D, Ergocalciferol, (DRISDOL) 1.25 MG (50000 UNIT) CAPS capsule Take 50,000 Units by mouth every 30 (thirty) days.    [provider]    Allergies    Patient has no known allergies.  Review of Systems   Review of Systems  All other systems reviewed and are negative.  Physical Exam Updated Vital Signs BP 132/81 (BP Location: Right Arm)   Pulse 85   Temp 98.4 F (36.9 C) (Oral)   Resp 18   Ht 1.702 m ('5\' 7"'$ )   Wt 81.6 kg   SpO2 99%   BMI 28.19 kg/m   Physical Exam Vitals and nursing note reviewed.  Constitutional:      Appearance: Normal appearance.  HENT:     Head: Normocephalic and atraumatic.     Right Ear: External ear normal.     Left Ear: External ear normal.     Nose: Nose normal.     Mouth/Throat:     Pharynx: Oropharynx is clear.  Eyes:     Extraocular Movements: Extraocular movements intact.     Pupils: Pupils are equal, round, and reactive to light.  Cardiovascular:     Rate and Rhythm: Normal rate and regular rhythm.  Pulmonary:     Effort: Pulmonary effort is normal.  Abdominal:     General: Abdomen is flat.     Palpations: Abdomen is soft.  Musculoskeletal:        General: Swelling and tenderness present. Normal range of motion.     Cervical back: Normal range of motion.  Skin:    General: Skin is warm.     Capillary Refill: Capillary refill takes less than 2 seconds.     Findings: Erythema present.  Neurological:     General: No focal deficit present.     Mental Status: He is alert.  Psychiatric:        Mood and Affect: Mood normal.    ED Results / Procedures / Treatments   Labs (all labs ordered are listed, but only abnormal results are displayed) Labs Reviewed  BASIC METABOLIC PANEL - Abnormal; Notable for the following components:      Result Value   Calcium 8.7 (*)    All other components within normal limits  CBC WITH DIFFERENTIAL/PLATELET - Abnormal; Notable  for the following components:   RBC 3.76 (*)    Hemoglobin 10.9 (*)    HCT 34.6 (*)    All other components within normal limits  LACTIC ACID, PLASMA  LACTIC ACID, PLASMA    EKG None  Radiology DG Tibia/Fibula Right  Result Date: 08/01/2021 CLINICAL DATA:  Necrotic wound. EXAM: RIGHT TIBIA AND FIBULA - 2 VIEW COMPARISON:  February 10, 2021 FINDINGS: There is no evidence of fracture or dislocation. A moderate-sized plantar calcaneal spur is seen. Moderate severity diffuse soft tissue swelling is seen within the region of the right ankle. No soft tissue air is identified. IMPRESSION: Moderate severity diffuse soft tissue swelling within the region of the right ankle. Electronically Signed   By: Virgina Norfolk M.D.   On: 08/01/2021 19:55    Procedures Procedures   Medications Ordered in ED Medications - No data to display  ED Course  I have reviewed the triage vital signs and the nursing notes.  Pertinent labs & imaging results that were available during my care of the patient were reviewed by me and considered in my medical decision making (see chart for details).    MDM Rules/Calculators/A&P                          Patient with worsening chronic wound rle with surrounding cellulitis. Patient without acute systemic symptoms. Patient followed in wound care clinic and sent here due to worsening symptoms. Patient not good candidate for op management due to mr and ho tbi.  9:16 AM Discussed with Dr. Marianna Payment for IM and will see patient for evaluation admission and ongoing treatment. 9:44 AM Patient sitting in bed.  NAD.  Final Clinical Impression(s) / ED Diagnoses Final diagnoses:  Cellulitis of right lower extremity  Wound of right lower extremity, initial encounter  Rx / DC Orders ED Discharge Orders     None        Pattricia Boss, MD 08/02/21 [phone]

## 2021-08-02 NOTE — ED Notes (Signed)
Lunch tray ordered 

## 2021-08-02 NOTE — Progress Notes (Signed)
Pharmacy Antibiotic Note  [name] is a 70 y.o. male admitted on 08/01/2021 with  wound infection .  Pharmacy has been consulted for Cefepime and Vancomycin dosing.  WBC 6.6, afebrile SCr 0.89 - at baseline  Plan: Cefepime 2 gm every 8 hours Give vancomycin 1750 mg IV x1 Vancomycin 1000 mg IV every 12 hours.  Goal trough 15-20 mcg/mL.   - eAUC 522.2, SCr used 0.89, Cmax 31.4 Monitor renal function, CBC, cultures/sensitivities, LOT and de-escalate as able Obtain vancomycin levels as needed  Height: '5\' 7"'$  (170.2 cm) Weight: 81.6 kg (180 lb) IBW/kg (Calculated) : 66.1  Temp (24hrs), Avg:98.5 F (36.9 C), Min:98.3 F (36.8 C), Max:98.9 F (37.2 C)  Recent Labs  Lab 08/01/21 1909  WBC 6.6  CREATININE 0.89  LATICACIDVEN 0.8    Estimated Creatinine Clearance: 80.1 mL/min (by C-G formula based on SCr of 0.89 mg/dL).    No Known Allergies  Antimicrobials this admission: Cefepime 9/13 >>  Vancomycin 9/13 >>  Dose adjustments this admission: none  Microbiology results: 9/13 BCx: pending 9/13 MRSA PCR: pending  Thank you for allowing pharmacy to be a part of this patient's care.  Laurey Arrow, PharmD PGY1 Pharmacy Resident 08/02/2021  1:58 PM  Please check [email or web address] for unit-specific pharmacy phone numbers.

## 2021-08-02 NOTE — ED Notes (Signed)
Report given to Jordan,RN

## 2021-08-03 DIAGNOSIS — Z9841 Cataract extraction status, right eye: Secondary | ICD-10-CM | POA: Diagnosis not present

## 2021-08-03 DIAGNOSIS — R7982 Elevated C-reactive protein (CRP): Secondary | ICD-10-CM | POA: Diagnosis present

## 2021-08-03 DIAGNOSIS — Z79899 Other long term (current) drug therapy: Secondary | ICD-10-CM | POA: Diagnosis not present

## 2021-08-03 DIAGNOSIS — L0390 Cellulitis, unspecified: Secondary | ICD-10-CM | POA: Diagnosis present

## 2021-08-03 DIAGNOSIS — R262 Difficulty in walking, not elsewhere classified: Secondary | ICD-10-CM | POA: Diagnosis present

## 2021-08-03 DIAGNOSIS — E669 Obesity, unspecified: Secondary | ICD-10-CM | POA: Diagnosis present

## 2021-08-03 DIAGNOSIS — L089 Local infection of the skin and subcutaneous tissue, unspecified: Secondary | ICD-10-CM | POA: Diagnosis not present

## 2021-08-03 DIAGNOSIS — L03115 Cellulitis of right lower limb: Secondary | ICD-10-CM | POA: Diagnosis present

## 2021-08-03 DIAGNOSIS — I872 Venous insufficiency (chronic) (peripheral): Secondary | ICD-10-CM | POA: Diagnosis present

## 2021-08-03 DIAGNOSIS — I878 Other specified disorders of veins: Secondary | ICD-10-CM | POA: Diagnosis present

## 2021-08-03 DIAGNOSIS — T148XXA Other injury of unspecified body region, initial encounter: Secondary | ICD-10-CM | POA: Diagnosis not present

## 2021-08-03 DIAGNOSIS — R4189 Other symptoms and signs involving cognitive functions and awareness: Secondary | ICD-10-CM | POA: Diagnosis present

## 2021-08-03 DIAGNOSIS — I10 Essential (primary) hypertension: Secondary | ICD-10-CM | POA: Diagnosis present

## 2021-08-03 DIAGNOSIS — Z6828 Body mass index (BMI) 28.0-28.9, adult: Secondary | ICD-10-CM | POA: Diagnosis not present

## 2021-08-03 DIAGNOSIS — Z9842 Cataract extraction status, left eye: Secondary | ICD-10-CM | POA: Diagnosis not present

## 2021-08-03 DIAGNOSIS — Z7951 Long term (current) use of inhaled steroids: Secondary | ICD-10-CM | POA: Diagnosis not present

## 2021-08-03 DIAGNOSIS — Z833 Family history of diabetes mellitus: Secondary | ICD-10-CM | POA: Diagnosis not present

## 2021-08-03 DIAGNOSIS — Z20822 Contact with and (suspected) exposure to covid-19: Secondary | ICD-10-CM | POA: Diagnosis present

## 2021-08-03 DIAGNOSIS — L97319 Non-pressure chronic ulcer of right ankle with unspecified severity: Secondary | ICD-10-CM | POA: Diagnosis present

## 2021-08-03 DIAGNOSIS — Z87820 Personal history of traumatic brain injury: Secondary | ICD-10-CM | POA: Diagnosis not present

## 2021-08-03 DIAGNOSIS — K219 Gastro-esophageal reflux disease without esophagitis: Secondary | ICD-10-CM | POA: Diagnosis present

## 2021-08-03 LAB — CBC
HCT: 32.9 % — ABNORMAL LOW (ref 39.0–52.0)
Hemoglobin: 10.5 g/dL — ABNORMAL LOW (ref 13.0–17.0)
MCH: 28.7 pg (ref 26.0–34.0)
MCHC: 31.9 g/dL (ref 30.0–36.0)
MCV: 89.9 fL (ref 80.0–100.0)
Platelets: 198 10*3/uL (ref 150–400)
RBC: 3.66 MIL/uL — ABNORMAL LOW (ref 4.22–5.81)
RDW: 13.9 % (ref 11.5–15.5)
WBC: 6.5 10*3/uL (ref 4.0–10.5)
nRBC: 0 % (ref 0.0–0.2)

## 2021-08-03 LAB — BASIC METABOLIC PANEL
Anion gap: 7 (ref 5–15)
BUN: 17 mg/dL (ref 8–23)
CO2: 25 mmol/L (ref 22–32)
Calcium: 8.5 mg/dL — ABNORMAL LOW (ref 8.9–10.3)
Chloride: 102 mmol/L (ref 98–111)
Creatinine, Ser: 1.22 mg/dL (ref 0.61–1.24)
GFR, Estimated: >60 mL/min (ref >60)
Glucose, Bld: 128 mg/dL — ABNORMAL HIGH (ref 70–99)
Potassium: 4.2 mmol/L (ref 3.5–5.1)
Sodium: 134 mmol/L — ABNORMAL LOW (ref 135–145)

## 2021-08-03 MED ORDER — MOMETASONE FURO-FORMOTEROL FUM 100-5 MCG/ACT IN AERO
2.0000 | INHALATION_SPRAY | Freq: Two times a day (BID) | RESPIRATORY_TRACT | Status: DC
  Administered 2021-08-04 – 2021-08-05 (×2): 2 via RESPIRATORY_TRACT
  Filled 2021-08-03 (×2): qty 8.8

## 2021-08-03 MED ORDER — CEFAZOLIN SODIUM-DEXTROSE 1-4 GM/50ML-% IV SOLN
1.0000 g | Freq: Three times a day (TID) | INTRAVENOUS | Status: DC
  Administered 2021-08-03 – 2021-08-05 (×6): 1 g via INTRAVENOUS
  Filled 2021-08-03 (×9): qty 50

## 2021-08-03 MED ORDER — ALBUTEROL SULFATE (2.5 MG/3ML) 0.083% IN NEBU: 3.0000 mL | INHALATION_SOLUTION | RESPIRATORY_TRACT | Status: DC | PRN

## 2021-08-03 MED ORDER — SODIUM CHLORIDE 0.9 % IV SOLN: 2.0000 g | Freq: Two times a day (BID) | INTRAVENOUS | Status: DC

## 2021-08-03 MED ORDER — TAMSULOSIN HCL 0.4 MG PO CAPS
0.4000 mg | ORAL_CAPSULE | Freq: Every day | ORAL | Status: DC
  Administered 2021-08-03 – 2021-08-05 (×3): 0.4 mg via ORAL
  Filled 2021-08-03 (×3): qty 1

## 2021-08-03 NOTE — NC FL2 (Signed)
[place] LEVEL OF CARE SCREENING TOOL     IDENTIFICATION  Patient Name: [name] Birthdate: Oct 11, 1951 Sex: male Admission Date (Current Location): 08/01/2021  [facility] and [place] Number:  Herbalist and Address:  [facility] 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 08/03/2021   Cellulitis 08/03/2021   Right Lower Extremity Wound infection 12/01/2020   Varicose veins of right lower extremity with complications 123XX123   Mild persistent asthma without complication 123456   Neuropathy 11/16/2014   TBI (traumatic brain injury) ([place]) 10/08/2014   Cognitive deficits 10/08/2014   HTN (hypertension) 10/08/2014    Orientation RESPIRATION BLADDER Height & Weight     Self, Time, Situation, Place  Normal Continent Weight: 81.6 kg Height:  '5\' 7"'$  (170.2 cm)  BEHAVIORAL SYMPTOMS/MOOD NEUROLOGICAL BOWEL NUTRITION STATUS      Continent Diet  AMBULATORY STATUS COMMUNICATION OF NEEDS Skin   Limited Assist Verbally Other (Comment) (Cut to fit silver hydrofiber (Aquacel Ag+)- Cover with silicone foam. Wrap from toes to knees with kerlix and ACE wrap while inpatient. Change daily.)                       Personal Care Assistance Level of Assistance  Bathing, Feeding, Dressing Bathing Assistance: Limited assistance Feeding assistance: Independent Dressing Assistance: Limited assistance     Functional Limitations Info              SPECIAL CARE FACTORS FREQUENCY  PT (By licensed PT), OT (By licensed OT)     PT Frequency: five times a day OT Frequency: five times a day            Contractures Contractures Info: Not present    Additional Factors Info  Code Status, Allergies Code Status Info: full code Allergies Info: no known allergies           Current Medications (08/03/2021):  This is the current hospital active medication list Current Facility-Administered Medications  Medication Dose Route Frequency Provider Last Rate Last Admin   acetaminophen (TYLENOL) tablet 650 mg  650 mg Oral Q6H PRN Marianna Payment, MD   650 mg at 08/03/21 1142   Or   acetaminophen (TYLENOL) suppository 650 mg  650 mg Rectal Q6H PRN Marianna Payment, MD       albuterol (PROVENTIL) (2.5 MG/3ML) 0.083% nebulizer solution 3 mL  3 mL Inhalation Q4H PRN Sanjuan Dame, MD       ceFAZolin (ANCEF) IVPB 1 g/50 mL premix  1 g Intravenous Q8H Masters, Katie, DO       enoxaparin (LOVENOX) injection 40 mg  40 mg Subcutaneous Q24H Marianna Payment, MD   40 mg at 08/02/21 1612   mometasone-formoterol (DULERA) 100-5 MCG/ACT inhaler 2 puff  2 puff Inhalation BID Sanjuan Dame, MD       ondansetron [facility]) tablet 4 mg  4 mg Oral Q6H PRN Marianna Payment, MD  Or   ondansetron (ZOFRAN) injection 4 mg  4 mg Intravenous Q6H PRN Marianna Payment, MD       polyethylene glycol (MIRALAX / GLYCOLAX) packet 17 g  17 g Oral Daily PRN Marianna Payment, MD       sodium chloride flush (NS) 0.9 % injection 3 mL  3 mL Intravenous Q12H Marianna Payment, MD   3 mL at 08/02/21 2234   tamsulosin (FLOMAX) capsule 0.4 mg  0.4 mg Oral Daily Sanjuan Dame, MD         Discharge Medications: Please see discharge summary for a list of discharge medications.  Relevant Imaging Results:  Relevant Lab Results:   Additional Information [address], Edson Snowball, RN

## 2021-08-03 NOTE — Plan of Care (Signed)
?  Problem: Clinical Measurements: ?Goal: Ability to avoid or minimize complications of infection will improve ?Outcome: Progressing ?  ?Problem: Skin Integrity: ?Goal: Skin integrity will improve ?Outcome: Progressing ?  ?

## 2021-08-03 NOTE — Progress Notes (Addendum)
HD#0 SUBJECTIVE:  Patient Summary: [name] is a 70 y.o. with a pertinent PMH of traumatic brain injury and chronic venous stasis of both legs, and hypertension who presented from wound care center with cellulitis and admitted for cellulitis refractory to oral antibiotics.   Overnight Events: Patient states that his right leg is painful.  Denies other concerns at this time.  OBJECTIVE:  Vital Signs: Vitals:   08/02/21 2230 08/02/21 2300 08/03/21 0602 08/03/21 0923  BP: 98/65 135/78 125/73 114/79  Pulse: (!) 57 77 72 61  Resp: (!) _0 Temp:  98.6 F (37 C) 98.1 F (36.7 C) 98.6 F (37 C)  TempSrc:  Oral Oral Oral  SpO2: 97% 99% 99% 100%  Weight:      Height:       Supplemental O2: Room Air SpO2: 100 %  Filed Weights   08/01/21 1838  Weight: 81.6 kg     Intake/Output Summary (Last 24 hours) at 08/03/2021 1535 Last data filed at 08/03/2021 1144 Gross per 24 hour  Intake 100 ml  Output 1075 ml  Net -975 ml   Net IO Since Admission: -975 mL [08/03/21 1535]  Physical Exam: Constitutional: well-Developed, well nourished HENT: NCAT Eyes: Conjunctiva clear, no scleral icterus CV: No murmurs, rubs, or gallops Pulmonology: There to auscultation bilaterally, pulmonary effort normal GI: No tenderness, bowel sounds present MSK: Ulcer without purulent drainage on medial malleolus of right leg, the edges are well-defined with granulation present Psych: normal mood and affect, cognitively impaired  Patient Lines/Drains/Airways Status     Active Line/Drains/Airways     Name Placement date Placement time Site Days   Peripheral IV 08/02/21 20 G Anterior;Left;Proximal Forearm 08/02/21  0832  Forearm  1   Peripheral IV 08/03/21 22 G 1" Anterior;Right Forearm 08/03/21  1420  Forearm  less than 1   Wound / Incision (Open or Dehisced) Venous stasis ulcer;Non-pressure wound Pretibial Right --  --  Pretibial  --            Pertinent Labs: CBC Latest Ref Rng &  Units 08/03/2021 08/01/2021 02/10/2021  WBC 4.0 - 10.5 K/uL 6.5 6.6 4.1  Hemoglobin 13.0 - 17.0 g/dL 10.5(L) 10.9(L) 9.7(L)  Hematocrit 39.0 - 52.0 % 32.9(L) 34.6(L) 29.2(L)  Platelets 150 - 400 K/uL 198 229 154    CMP Latest Ref Rng & Units 08/03/2021 08/01/2021 02/10/2021  Glucose 70 - 99 mg/dL 128(H) 93 94  BUN 8 - 23 mg/dL _1 Creatinine 0.61 - 1.24 mg/dL 1.22 0.89 0.76  Sodium 135 - 145 mmol/L 134(L) 136 136  Potassium 3.5 - 5.1 mmol/L 4.2 3.7 3.9  Chloride 98 - 111 mmol/L 102 104 109  CO2 22 - 32 mmol/L _2 Calcium 8.9 - 10.3 mg/dL 8.5(L) 8.7(L) 8.3(L)  Total Protein 6.5 - 8.1 g/dL - - -  Total Bilirubin 0.3 - 1.2 mg/dL - - -  Alkaline Phos 38 - 126 U/L - - -  AST 15 - 41 U/L - - -  ALT 0 - 44 U/L - - -    No results for input(s): GLUCAP in the last 72 hours.   Pertinent Imaging: No results found.  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 with a pertinent PMH of attic brain injury,  chronic venous stasis of both legs, and hypertension who presented from wound care center with cellulitis and admitted for cellulitis refractory to oral antibiotics.   Plan: #Cellulitis Patient presents with right lower extremity venous stasis ulcer located by acute on chronic cellulitis resistant to oral antibiotics.  Ulcer has been present since January 2022.  He followed with wound care center from January to March.  There was a worsening of the ulcer and August and Bactrim was initiated.  No improvement was seen and after oral antibiotics the patient was sent to the emergency room.  He was started on vancomycin and cefepime in the ED.  This was de-escalated today to IV cefazolin.  Blood cultures have no growth to date.  ESR and CRP elevated, however x-ray showed moderate severity diffuse soft  tissue swelling without gas or evidence of osteomyelitis.  Patient has remained afebrile with stable white blood cell count.  Reached out to [name], patient's legal guardian, and he stated patient currently lives at home alone.  Wound Care consulted with recommendations of silicone foam and silver Hydrofiber and wrapped with Ace bandage for compression from toes to the knees.  Physical therapy evaluated patient and stated that he needed help to walk secondary to the pain in his right foot.  We will continue to monitor for improvement with IV antibiotics. -Continue IV cefazolin -Wound care consulted -[facility] consult  #History of TBI with cognitive impairment [facility] but lives by himself at home.  He has a legal guardian who helps him make it to his appointments.   Best Practice: Diet: Regular diet IVF: Fluids: none VTE: enoxaparin (LOVENOX) injection 40 mg Start: 08/02/21 1600 Code: Full AB: Cefazolin 1 g Therapy Recs: Home Health, DME: none Family Contact: Legal guardian is [name], called and updated him DISPO: Anticipated discharge in 1-2 days to Skilled nursing facility pending IV antibiotics.  Signature: [name], D.O. Internal Medicine Resident, PGY-1 [name] Internal Medicine Residency  Pager: [phone] 3:35 PM, 08/03/2021   Please contact the on call pager after 5 pm and on weekends at [phone].    Internal Medicine Attending:   I saw and examined the patient. I reviewed the resident's note and I agree with the resident's findings and plan as documented in the resident's note.  Perry Brothers, MD

## 2021-08-03 NOTE — Progress Notes (Signed)
Physical Therapy Evaluation Patient Details Name: [name] MRN: [id number] DOB: [birth year] Today's Date: 08/03/2021  History of Present Illness  70 year old admitted 9/12 with chronic venous stasis changes of both legs with worsening nonpurulent skin and soft tissue infection of a venous stasis ulcer around the right medial malleolus.  Patient reports increased pain and difficulty with ambulation due to this ulcer.  PMH: TBI. HTN  Clinical Impression  Pt admitted with above diagnosis. Pt was able to ambulate with RW but needed min assist. Unsure if this is pts baseline as [facility].  If it is not his baseline,  he may need SNF as he does need assist to walk.  Pt currently with functional limitations due to the deficits listed below (see PT Problem List). Pt will benefit from skilled PT to increase their independence and safety with mobility to allow discharge to the venue listed below.          Recommendations for follow up therapy are one component of a multi-disciplinary discharge planning process, led by the attending physician.  Recommendations may be updated based on patient status, additional functional criteria and insurance authorization.  Follow Up Recommendations Home health PT if pt has 24 hour care. If not, will need SNF.     Equipment Recommendations  None recommended by PT    Recommendations for Other Services       Precautions / Restrictions Precautions Precautions: Fall Restrictions Weight Bearing Restrictions: No      Mobility  Bed Mobility Overal bed mobility: Needs Assistance Bed Mobility: Supine to Sit     Supine to sit: Supervision     General bed mobility comments: Pt able to come to EOB on his own but somewhat impulsive with moveemnt    Transfers Overall transfer level: Needs assistance Equipment used: Rolling walker (2 wheeled) Transfers: Sit to/from Stand Sit to Stand: Min assist;From elevated surface         General transfer  comment: Pt neeeds cues for hand placemetn. Pt also widens BOS to stand and stands behind RW with flexed posture. Have a feeling that pt is possibly at baseline with his mobility.  Ambulation/Gait Ambulation/Gait assistance: Min assist Gait Distance (Feet): 150 Feet Assistive device: Rolling walker (2 wheeled) Gait Pattern/deviations: Step-through pattern;Decreased stride length;Decreased step length - left;Decreased step length - right;Shuffle;Drifts right/left;Trunk flexed;Wide base of support   Gait velocity interpretation: <1.31 ft/sec, indicative of household ambulator General Gait Details: Pt with multiple gait deviations and question whether this is pts baseline. Pt stands behind RW and does not step closer when cued for any length of time (pt has his RW from home). Pt has shortened step length bilaterally shuffling his feet most of walk. Pt also with flexed hips and knees as well.  Needed min assist for safety with RW.  Stairs            Wheelchair Mobility    Modified Rankin (Stroke Patients Only)       Balance                                             Pertinent Vitals/Pain Pain Assessment: Faces Faces Pain Scale: Hurts even more Pain Location: right LE Pain Descriptors / Indicators: Grimacing;Guarding Pain Intervention(s): Limited activity within patient's tolerance;Monitored during session;Repositioned    Home Living Family/patient expects to be discharged to:: Private residence Living Arrangements:  Alone   Type of Home: Apartment Home Access: Level entry     Home Layout: One level Home Equipment: [place] - 4 wheels;Cane - single point;Wheelchair - [employer];Shower seat      Prior Function Level of Independence: Independent with assistive device(s)         Comments: Pt ambulates with tall walking stick. He uses a rollator to walk to/from grocery store. [facility] provides transportation to medical appts.     Hand  Dominance   Dominant Hand: Left    Extremity/Trunk Assessment   Upper Extremity Assessment Upper Extremity Assessment: Defer to OT evaluation    Lower Extremity Assessment Lower Extremity Assessment: Generalized weakness    Cervical / Trunk Assessment Cervical / Trunk Assessment: Kyphotic  Communication   Communication: No difficulties  Cognition Arousal/Alertness: Awake/alert Behavior During Therapy: Impulsive Overall Cognitive Status: History of cognitive impairments - at baseline                                        General Comments      Exercises     Assessment/Plan    PT Assessment Patient needs continued PT services  PT Problem List Decreased activity tolerance;Decreased balance;Decreased mobility;Decreased knowledge of use of DME;Decreased safety awareness;Decreased cognition       PT Treatment Interventions DME instruction;Gait training;Functional mobility training;Therapeutic activities;Therapeutic exercise;Balance training;Patient/family education    PT Goals (Current goals can be found in the Care Plan section)  Acute Rehab PT Goals Patient Stated Goal: [facility] to [facility] PT Goal Formulation: With patient Time For Goal Achievement: 08/17/21 Potential to Achieve Goals: Good    Frequency Min 3X/week   Barriers to discharge Decreased caregiver support      Co-evaluation               AM-PAC PT "6 Clicks" Mobility  Outcome Measure Help needed turning from your back to your side while in a flat bed without using bedrails?: None Help needed moving from lying on your back to sitting on the side of a flat bed without using bedrails?: None Help needed moving to and from a bed to a chair (including a wheelchair)?: A Little Help needed standing up from a chair using your arms (e.g., wheelchair or bedside chair)?: A Little Help needed to walk in hospital room?: A Little Help needed climbing 3-5 steps with a railing? : A Lot 6 Click  Score: 19    End of Session Equipment Utilized During Treatment: Gait belt Activity Tolerance: Patient limited by fatigue Patient left: in chair;with call bell/phone within reach;with chair alarm set Nurse Communication: Mobility status PT Visit Diagnosis: Unsteadiness on feet (R26.81)    Time: [id number] PT Time Calculation (min) (ACUTE ONLY): 23 min   Charges:   PT Evaluation $PT Eval Moderate Complexity: 1 Mod PT Treatments $Gait Training: 8-22 mins        Lacrystal Barbe M,PT Acute Rehab Services [phone] (pager)   Alvira Philips 08/03/2021, 2:15 PM

## 2021-08-03 NOTE — TOC Initial Note (Addendum)
Transition of Care [facility]) - Initial/Assessment Note    Patient Details  Name: [name] MRN: [id number] Date of Birth: 11/04/1951  Transition of Care [facility]) CM/SW Contact:    Marilu Favre, RN Phone Number: 08/03/2021, 3:15 PM  Clinical Narrative:                 NCM spoke to patient's legal guardian Joe Suggs [phone] . Joe was unaware patient was at [facility] sent him to ED.   Also explained PT worked with [facility] and are recommending HHPT if patient has 24 hour supervision. Per Joe patient lives alone and will need to go to SNF.   Patient recently at [facility].   [facility]CM called [facility] [id number] spoke to Mel , [facility]'s [facility] SW is [employer] [phone] , NCM left Traci a message.  Patient prefers to go home at discharge but agreeable to SNF   Faxed FL2  Expected Discharge Plan: [place] Barriers to Discharge: Continued Medical Work up   Patient Goals and CMS Choice     Choice offered to / list presented to : Patient  Expected Discharge Plan and Services Expected Discharge Plan: [place]   Discharge Planning Services: CM Consult                       DME Agency: NA       HH Arranged: NA          Prior Living Arrangements/Services   Lives with:: Self                   Activities of Daily Living Home Assistive Devices/Equipment: Cane (specify quad or straight) ADL Screening (condition at time of admission) Patient's cognitive ability adequate to safely complete daily activities?: Yes Is the patient deaf or have difficulty hearing?: No Does the patient have difficulty seeing, even when wearing glasses/contacts?: No Does the patient have difficulty concentrating, remembering, or making decisions?: No Patient able to express need for assistance with ADLs?: Yes Does the patient have difficulty dressing or bathing?: Yes Independently performs ADLs?: Yes (appropriate for  developmental age) Does the patient have difficulty walking or climbing stairs?: Yes Weakness of Legs: Right Weakness of Arms/Hands: None  Permission Sought/Granted                  Emotional Assessment              Admission diagnosis:  Cellulitis [L03.90] Cellulitis of right lower extremity [L03.115] Wound of right lower extremity, initial encounter [S81.801A] Patient Active Problem List   Diagnosis Date Noted   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 08/03/2021   Cellulitis 08/03/2021   Right Lower Extremity Wound infection 12/01/2020   Varicose veins of right lower extremity with complications 123XX123   Mild persistent asthma without complication 123456   Neuropathy 11/16/2014   TBI (traumatic brain injury) ([place]) 10/08/2014   Cognitive deficits 10/08/2014   HTN (hypertension) 10/08/2014   PCP:  Janifer Adie, MD Pharmacy:   [facility], [place] - [address] [place] [address] Phone: [phone] Fax: [phone]  [place], [place] - [address] [place] [address] Phone: [phone] Fax: [phone]     Social Determinants of Health (SDOH) Interventions    Readmission Risk Interventions No flowsheet data found.

## 2021-08-04 ENCOUNTER — Ambulatory Visit: Payer: Medicare (Managed Care) | Admitting: Physician Assistant

## 2021-08-04 LAB — BASIC METABOLIC PANEL
Anion gap: 4 — ABNORMAL LOW (ref 5–15)
BUN: 17 mg/dL (ref 8–23)
CO2: 27 mmol/L (ref 22–32)
Calcium: 8.7 mg/dL — ABNORMAL LOW (ref 8.9–10.3)
Chloride: 107 mmol/L (ref 98–111)
Creatinine, Ser: 1 mg/dL (ref 0.61–1.24)
GFR, Estimated: >60 mL/min (ref >60)
Glucose, Bld: 109 mg/dL — ABNORMAL HIGH (ref 70–99)
Potassium: 4.4 mmol/L (ref 3.5–5.1)
Sodium: 138 mmol/L (ref 135–145)

## 2021-08-04 LAB — CBC
HCT: 33.3 % — ABNORMAL LOW (ref 39.0–52.0)
Hemoglobin: 10.8 g/dL — ABNORMAL LOW (ref 13.0–17.0)
MCH: 29.2 pg (ref 26.0–34.0)
MCHC: 32.4 g/dL (ref 30.0–36.0)
MCV: 90 fL (ref 80.0–100.0)
Platelets: 214 10*3/uL (ref 150–400)
RBC: 3.7 MIL/uL — ABNORMAL LOW (ref 4.22–5.81)
RDW: 13.7 % (ref 11.5–15.5)
WBC: 5.2 10*3/uL (ref 4.0–10.5)
nRBC: 0 % (ref 0.0–0.2)

## 2021-08-04 NOTE — Plan of Care (Signed)
Stable overnight. IV Abx for RLE cellulitis, afebrile, WBC 5.2. Ambulates in room with FWW and minimal assistance. VSS. Dressing to RLE per wound RN recommendations.   Tylenol po x 2 for headache.   Problem: Clinical Measurements: Goal: Ability to avoid or minimize complications of infection will improve Outcome: Progressing   Problem: Skin Integrity: Goal: Skin integrity will improve Outcome: Progressing   Problem: Education: Goal: Knowledge of General Education information will improve Description: Including pain rating scale, medication(s)/side effects and non-pharmacologic comfort measures Outcome: Progressing   Problem: Clinical Measurements: Goal: Will remain free from infection Outcome: Progressing Goal: Respiratory complications will improve Outcome: Progressing Goal: Cardiovascular complication will be avoided Outcome: Progressing   Problem: Activity: Goal: Risk for activity intolerance will decrease Outcome: Progressing   Problem: Coping: Goal: Level of anxiety will decrease Outcome: Progressing   Problem: Pain Managment: Goal: General experience of comfort will improve Outcome: Progressing

## 2021-08-04 NOTE — TOC Progression Note (Addendum)
Transition of Care [facility]) - Progression Note    Patient Details  Name: ISHAWN MULROY MRN: [id number] Date of Birth: [birth year]  Transition of Care [facility]) CM/SW Contact  Jacalyn Lefevre Edson Snowball, RN Phone Number: 08/04/2021, 12:05 PM  Clinical Narrative:    [facility] bed.   NCM spoke to patient's legal guardian Barnie Alderman [phone] via phone and patient at bedside they accepted bed.   NCM spoke to [facility]  Sydnee Cabal [id number] , [facility] will approve SNF.   NCM left a message for Lexine Baton at [facility] does not have a bed today but will follow.   At discharge [facility] to [facility] , [phone] ask for Transportation , [place] ot [place]. Expected Discharge Plan: [place] Barriers to Discharge: Continued Medical Work up  Expected Discharge Plan and Services Expected Discharge Plan: [place]   Discharge Planning Services: CM Consult                       DME Agency: NA       HH Arranged: NA           Social Determinants of Health (SDOH) Interventions    Readmission Risk Interventions No flowsheet data found.

## 2021-08-04 NOTE — Progress Notes (Addendum)
HD#1 SUBJECTIVE:  Patient Summary: [name] is a 70 y.o. with a pertinent PMH of traumatic brain injury and chronic venous stasis of both legs, and hypertension who presented from wound care center with cellulitis and admitted for cellulitis refractory to oral antibiotics.   Overnight Events: Patient states that his leg is less painful than yesterday.  He has continued to be cold while in the hospital, but remains afebrile.   OBJECTIVE:  Vital Signs: Vitals:   08/03/21 2100 08/04/21 0313 08/04/21 0715 08/04/21 1001  BP: 107/68 93/60  114/68  Pulse: 70 62  70  Resp: '19 17  18  '$ Temp: 98.4 F (36.9 C) 98.1 F (36.7 C) 98.1 F (36.7 C) 99.1 F (37.3 C)  TempSrc: Oral Oral Oral Oral  SpO2: 98% 97%  97%  Weight:      Height:       Supplemental O2: Room Air SpO2: 97 %  Filed Weights   08/01/21 1838  Weight: 81.6 kg     Intake/Output Summary (Last 24 hours) at 08/04/2021 1521 Last data filed at 08/04/2021 0530 Gross per 24 hour  Intake 170 ml  Output 300 ml  Net -130 ml   Net IO Since Admission: -1,105 mL [08/04/21 1521]  Physical Exam: Constitutional: well-Developed, well nourished HENT: NCAT Eyes: Conjunctiva clear, no scleral icterus CV: No murmurs, rubs, or gallops Pulmonology: There to auscultation bilaterally, pulmonary effort normal GI: No tenderness, bowel sounds present MSK: Wound is covered with dressing and compression wrap Psych: normal mood and affect, cognitively impaired  Patient Lines/Drains/Airways Status     Active Line/Drains/Airways     Name Placement date Placement time Site Days   Peripheral IV 08/03/21 22 G 1" Anterior;Right Forearm 08/03/21  1420  Forearm  1   Wound / Incision (Open or Dehisced) Venous stasis ulcer;Non-pressure wound Pretibial Right --  --  Pretibial  --            Pertinent Labs: CBC Latest Ref Rng & Units 08/04/2021 08/03/2021 08/01/2021  WBC 4.0 - 10.5 K/uL 5.2 6.5 6.6  Hemoglobin 13.0 - 17.0 g/dL 10.8(L)  10.5(L) 10.9(L)  Hematocrit 39.0 - 52.0 % 33.3(L) 32.9(L) 34.6(L)  Platelets 150 - 400 K/uL 214 198 229    CMP Latest Ref Rng & Units 08/04/2021 08/03/2021 08/01/2021  Glucose 70 - 99 mg/dL 109(H) 128(H) 93  BUN 8 - 23 mg/dL '17 17 14  '$ Creatinine 0.61 - 1.24 mg/dL 1.00 1.22 0.89  Sodium 135 - 145 mmol/L 138 134(L) 136  Potassium 3.5 - 5.1 mmol/L 4.4 4.2 3.7  Chloride 98 - 111 mmol/L 107 102 104  CO2 22 - 32 mmol/L '27 25 25  '$ Calcium 8.9 - 10.3 mg/dL 8.7(L) 8.5(L) 8.7(L)  Total Protein 6.5 - 8.1 g/dL - - -  Total Bilirubin 0.3 - 1.2 mg/dL - - -  Alkaline Phos 38 - 126 U/L - - -  AST 15 - 41 U/L - - -  ALT 0 - 44 U/L - - -    No results for input(s): GLUCAP in the last 72 hours.   Pertinent Imaging: No results found.  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 with a pertinent PMH of attic brain injury, chronic venous stasis of both legs, and hypertension who presented from wound care center  with cellulitis and admitted for cellulitis refractory to oral antibiotics on hospital day 1.   Plan:  #Non-purulent Cellulitis #Venous stasis ulcer, right medial malleolus Patient resented with right lower extremity venous stasis ulcer that was refractory to oral antibiotics.  He has received 3 days of IV antibiotics total, currently on cefazolin.  Patient has remained afebrile with a stable white blood cell count.  Patient states that his leg is less painful compared to yesterday.  We will continue on IV cefazolin before switching to oral antibiotics.  Spoke with patient's legal guardian, [name].  He is concerned of patient's ability to take care and food at home by himself.  Physical therapy evaluated patient and recommended home health physical therapy patient had 24-hour care, however patient lives alone  so recommendation of skilled nursing facility.  [facility] will approve patient for SNF placement to [facility]. -Continue IV cefazolin -TOC consulted  Best Practice: Diet: Regular diet IVF: Fluids: none VTE: enoxaparin (LOVENOX) injection 40 mg Start: 08/02/21 1600 Code: Full AB: Cefazolin 1 g Therapy Recs: Home Health, DME: none Family Contact: Legal guardian is [name], [phone] DISPO: Anticipated discharge in 1 day to Skilled nursing facility pending IV antibiotics.  Signature: Christiana Fuchs, D.O. Internal Medicine Resident, PGY-1 [facility]  Pager: [phone] 3:21 PM, 08/04/2021   Please contact the on call pager after 5 pm and on weekends at [phone].

## 2021-08-04 NOTE — Progress Notes (Addendum)
Pt reported no headaches today.  Changed dressing on LLE. Moderate, brown-yellow purulent drainage with a foul odor.  Cleansed with NS wipes, aquacel, ABD, kerlix, ACE wrap). Pt tolerated well.

## 2021-08-05 LAB — BASIC METABOLIC PANEL
Anion gap: 9 (ref 5–15)
BUN: 17 mg/dL (ref 8–23)
CO2: 26 mmol/L (ref 22–32)
Calcium: 8.8 mg/dL — ABNORMAL LOW (ref 8.9–10.3)
Chloride: 103 mmol/L (ref 98–111)
Creatinine, Ser: 0.77 mg/dL (ref 0.61–1.24)
GFR, Estimated: >60 mL/min (ref >60)
Glucose, Bld: 134 mg/dL — ABNORMAL HIGH (ref 70–99)
Potassium: 3.6 mmol/L (ref 3.5–5.1)
Sodium: 138 mmol/L (ref 135–145)

## 2021-08-05 LAB — CBC
HCT: 33.4 % — ABNORMAL LOW (ref 39.0–52.0)
Hemoglobin: 11 g/dL — ABNORMAL LOW (ref 13.0–17.0)
MCH: 29.3 pg (ref 26.0–34.0)
MCHC: 32.9 g/dL (ref 30.0–36.0)
MCV: 89.1 fL (ref 80.0–100.0)
Platelets: 215 10*3/uL (ref 150–400)
RBC: 3.75 MIL/uL — ABNORMAL LOW (ref 4.22–5.81)
RDW: 13.7 % (ref 11.5–15.5)
WBC: 4.8 10*3/uL (ref 4.0–10.5)
nRBC: 0 % (ref 0.0–0.2)

## 2021-08-05 LAB — SARS CORONAVIRUS 2 (TAT 6-24 HRS): SARS Coronavirus 2: NEGATIVE

## 2021-08-05 MED ORDER — CEPHALEXIN 500 MG PO CAPS: 500.0000 mg | ORAL_CAPSULE | Freq: Four times a day (QID) | ORAL | 0 refills | Status: AC

## 2021-08-05 MED ORDER — CEPHALEXIN 500 MG PO CAPS: 500.0000 mg | ORAL_CAPSULE | Freq: Four times a day (QID) | ORAL | 0 refills | Status: DC

## 2021-08-05 NOTE — Discharge Summary (Signed)
Name: [name] MRN: [id number] DOB: 06/22/1951 70 y.o. PCP: Janifer Adie, MD  Date of Admission: 08/01/2021  6:31 PM Date of Discharge: 08/05/2021 Attending Physician: Aldine Contes, MD  Discharge Diagnosis: 1. Cellulitis refractory to oral antibiotics  Discharge Medications: Allergies as of 08/05/2021   No Known Allergies      Medication List     TAKE these medications    acetaminophen 500 MG tablet Commonly known as: TYLENOL Take 500 mg by mouth every 8 (eight) hours as needed for headache or mild pain.   albuterol 108 (90 Base) MCG/ACT inhaler Commonly known as: VENTOLIN HFA Inhale 2 puffs into the lungs every 4 (four) hours as needed for wheezing. What changed: how much to take   atorvastatin 20 MG tablet Commonly known as: LIPITOR Take 20 mg by mouth daily.   budesonide-formoterol 160-4.5 MCG/ACT inhaler Commonly known as: SYMBICORT Inhale 2 puffs into the lungs 2 (two) times daily.   cephALEXin 500 MG capsule Commonly known as: KEFLEX Take 1 capsule (500 mg total) by mouth 4 (four) times daily for 8 days.   chlorhexidine 0.12 % solution Commonly known as: PERIDEX Use as directed 15 mLs in the mouth or throat 2 (two) times daily.   hydrocortisone 2.5 % cream Apply 1 application topically See admin instructions. Apply thin layer around perimeter of wound on MON, TUE, THU, and FRI.   loperamide 2 MG tablet Commonly known as: IMODIUM A-D Take 2 mg by mouth as needed for diarrhea or loose stools. Do not exceed more than '8mg'$  or 4 tablets within 24 hours.   losartan 50 MG tablet Commonly known as: COZAAR Take 50 mg by mouth daily.   Myrbetriq 25 MG Tb24 tablet Generic drug: mirabegron ER Take 25 mg by mouth daily.   PreviDent 5000 Booster Plus 1.1 % Pste Generic drug: Sodium Fluoride Place 1 application onto teeth at bedtime.   tamsulosin 0.4 MG Caps capsule Commonly known as: FLOMAX Take 0.4 mg by mouth daily.   Vitamin D  (Ergocalciferol) 1.25 MG (50000 UNIT) Caps capsule Commonly known as: DRISDOL Take 50,000 Units by mouth every 30 (thirty) days.               Discharge Care Instructions  (From admission, onward)           Start     Ordered   08/05/21 0000  Discharge wound care:       Comments: Cut to fit silver hydrofiber (Aquacel Ag+)-lawson # [id number]. Cover with silicone foam. Wrap from toes to knees with kerlix and ACE wrap while inpatient. Change daily.   08/05/21 1055            Disposition and follow-up:   [name] was discharged from [facility] in Stable condition.  At the hospital follow up visit please address:  1.  Patient will most likely need continued follow-up with wound care following rehab.  Please have patient take Keflex until 08/13/2021.  2. Further evaluation of normocytic anemia  3.  Labs / imaging needed at time of follow-up: CBC  4.  Pending labs/ test needing follow-up: none  Follow-up Appointments:  Follow-up Information     [facility]. Schedule an appointment as soon as possible for a visit.   Specialty: Wound Care Contact information: [address] [id number] ar [phone]               Follow-up with [facility] Course by problem list: 1. Cellulitis refractory to oral antibiotics Patient with past medical history of traumatic brain injury with cognitive deficits and chronic venous stasis changes of both legs presented from wound care center due to worsening of his non-purulent cellulitis over a chronic venous stasis ulcer that was refractory to oral antibiotics.  Patient is a [facility] patient and lives by himself.  He reported increased pain and difficulty with ambulation due to his ulcer.  He follows with [place] [facility] wound care center, where he was treated from January through March for a similar venous stasis ulcer.  He had a biopsy of ulcer  which ruled out squamous cell carcinoma or pyoderma, showed only stasis changes.  His legal guardian, [name], stated that this was improved since March but that the ulcer had worsened again in August.  Patient was initiated on Bactrim outpatient due to increasing pain and erythema.  X-ray showed moderate severity diffuse soft tissue swelling within the region of the right ankle.  No signs concerning for osteomyelitis.  He was initiated on IV cefazolin.  He received 4 days of IV antibiotics.  Patient reports improvement in pain in his leg.  He was able to work with physical therapy, but continued to have difficulty with ambulation.  Patient lives at home by himself and spends the day at the [facility] facility.  Will benefit from going to a skilled nursing facility for a short time to help with his recovery.  His IV antibiotics were switched to cephalexin 500 mg 4 times a day until August 13, 2021.  After that he will need to follow-up with wound care center for his chronic venous stasis ulcer.  Discharge Exam:   BP 108/73 (BP Location: Left Arm)   Pulse 64   Temp 98.1 F (36.7 C) (Oral)   Resp 18   Ht '5\' 7"'$  (1.702 m)   Wt 81.6 kg   SpO2 96%   BMI 28.19 kg/m  Discharge exam:  Constitutional: well-developed, well-nourished HENT: NCAT Eyes: No scleral icterus, conjunctival clear Cardiovascular: No Murmurs, rubs, or gallops Pulm: Clear to auscultation bilaterally, pulmonary effort normal GI: No tenderness, sounds present MSK: Wound is covered with dressing and compression wrap Skin: warm and dry Psych: Normal mood and affect, cognitively impaired  Pertinent Labs, Studies, and Procedures:  CBC Latest Ref Rng & Units 08/05/2021 08/04/2021 08/03/2021  WBC 4.0 - 10.5 K/uL 4.8 5.2 6.5  Hemoglobin 13.0 - 17.0 g/dL 11.0(L) 10.8(L) 10.5(L)  Hematocrit 39.0 - 52.0 % 33.4(L) 33.3(L) 32.9(L)  Platelets 150 - 400 K/uL 215 214 198    BMP Latest Ref Rng & Units 08/05/2021 08/04/2021 08/03/2021  Glucose 70 - 99  mg/dL 134(H) 109(H) 128(H)  BUN 8 - 23 mg/dL '17 17 17  '$ Creatinine 0.61 - 1.24 mg/dL 0.77 1.00 1.22  Sodium 135 - 145 mmol/L 138 138 134(L)  Potassium 3.5 - 5.1 mmol/L 3.6 4.4 4.2  Chloride 98 - 111 mmol/L 103 107 102  CO2 22 - 32 mmol/L '26 27 25  '$ Calcium 8.9 - 10.3 mg/dL 8.8(L) 8.7(L) 8.5(L)    X-rat Tibia/ Fibula right IMPRESSION: Moderate severity diffuse soft tissue swelling within the region of the right ankle  Discharge Instructions: Discharge Instructions     Diet - low sodium heart healthy   Complete by: As directed    Discharge instructions   Complete by: As directed    Mr. Jahnke, You were admitted here due to your infection on your leg.  This has gotten  better with IV antibiotic.  We are switching you to a antibiotic pill.  You will need to take this four times a day until 08/13/2021.  Please also continue dressing your wound and keeping it clean and dry. Thank you for letting us take part in your care.   Discharge wound care:   Complete by: As directed    Cut to fit silver hydrofiber (Aquacel Ag+)-lawson # [id number]. Cover with silicone foam. Wrap from toes to knees with kerlix and ACE wrap while inpatient. Change daily.   Increase activity slowly   Complete by: As directed        Signed: Christiana Fuchs, DO 08/05/2021, 2:55 PM   Pager:

## 2021-08-05 NOTE — Progress Notes (Signed)
[name] to be D/C'd  per MD order.  Discussed with [facility] RN and all questions fully answered.  VSS, Skin clean, dry and intact without evidence of skin break down, no evidence of skin tears noted--except cellulitis on pt right lower leg.  IV catheter discontinued intact. Site without signs and symptoms of complications. Dressing and pressure applied.  An After Visit Summary was printed and given to the patient.  D/c education completed with [facility] RN including follow up instructions, medication list, d/c activities limitations if indicated, with other d/c instructions as indicated by MD - RN able to verbalize understanding, all questions fully answered.   Patient instructed to return to ED, call 911, or call MD for any changes in condition.   Patient to be escorted via [place], and D/C to [facility] via [place].

## 2021-08-05 NOTE — Plan of Care (Signed)
Pt alert, orientated x3, disorientated to time, some cognitive deficits r/t previous TBI but cooperative, supine, HOB elevated, RLE compression dressing remains in place C/D/I, continent of b/b, c/o headache this shift, relieved w/ prn tylenol, no signs of distress, call bell in reach, will continue to monitor   Problem: Clinical Measurements: Goal: Ability to avoid or minimize complications of infection will improve Outcome: Progressing   Problem: Skin Integrity: Goal: Skin integrity will improve Outcome: Progressing   Problem: Education: Goal: Knowledge of General Education information will improve Description: Including pain rating scale, medication(s)/side effects and non-pharmacologic comfort measures Outcome: Progressing   Problem: Activity: Goal: Risk for activity intolerance will decrease Outcome: Progressing   Problem: Nutrition: Goal: Adequate nutrition will be maintained Outcome: Progressing   Problem: Safety: Goal: Ability to remain free from injury will improve Outcome: Progressing

## 2021-08-05 NOTE — Progress Notes (Signed)
   08/05/21 1200   PT - Assessment/Plan  PT Frequency (ACUTE ONLY) Min 2X/week  Follow Up Recommendations SNF  Noted that [facility] approved SNF for pt. Appropriate frequency for SNF pts is 2 xweek. Updated the d/c plan and frequency accordingly. Thanks. Zlata Alcaide M,PT Acute Rehab Services [phone] (pager)

## 2021-08-05 NOTE — Progress Notes (Signed)
Called and gave report to [place] at [facility].

## 2021-08-05 NOTE — Discharge Summary (Deleted)
Name: [name] MRN: [id number] DOB: 08-Dec-1950 70 y.o. PCP: Janifer Adie, MD  Date of Admission: 08/01/2021  6:31 PM Date of Discharge: 08/05/2021 Attending Physician: Aldine Contes, MD  Discharge Diagnosis: 1. Cellulitis refractory to oral antibiotics  Discharge Medications: Allergies as of 08/05/2021   No Known Allergies      Medication List     TAKE these medications    acetaminophen 500 MG tablet Commonly known as: TYLENOL Take 500 mg by mouth every 8 (eight) hours as needed for headache or mild pain.   albuterol 108 (90 Base) MCG/ACT inhaler Commonly known as: VENTOLIN HFA Inhale 2 puffs into the lungs every 4 (four) hours as needed for wheezing. What changed: how much to take   atorvastatin 20 MG tablet Commonly known as: LIPITOR Take 20 mg by mouth daily.   budesonide-formoterol 160-4.5 MCG/ACT inhaler Commonly known as: SYMBICORT Inhale 2 puffs into the lungs 2 (two) times daily.   cephALEXin 500 MG capsule Commonly known as: KEFLEX Take 1 capsule (500 mg total) by mouth 4 (four) times daily for 4 days.   chlorhexidine 0.12 % solution Commonly known as: PERIDEX Use as directed 15 mLs in the mouth or throat 2 (two) times daily.   hydrocortisone 2.5 % cream Apply 1 application topically See admin instructions. Apply thin layer around perimeter of wound on MON, TUE, THU, and FRI.   loperamide 2 MG tablet Commonly known as: IMODIUM A-D Take 2 mg by mouth as needed for diarrhea or loose stools. Do not exceed more than '8mg'$  or 4 tablets within 24 hours.   losartan 50 MG tablet Commonly known as: COZAAR Take 50 mg by mouth daily.   Myrbetriq 25 MG Tb24 tablet Generic drug: mirabegron ER Take 25 mg by mouth daily.   PreviDent 5000 Booster Plus 1.1 % Pste Generic drug: Sodium Fluoride Place 1 application onto teeth at bedtime.   tamsulosin 0.4 MG Caps capsule Commonly known as: FLOMAX Take 0.4 mg by mouth daily.   Vitamin D  (Ergocalciferol) 1.25 MG (50000 UNIT) Caps capsule Commonly known as: DRISDOL Take 50,000 Units by mouth every 30 (thirty) days.               Discharge Care Instructions  (From admission, onward)           Start     Ordered   08/05/21 0000  Discharge wound care:       Comments: Cut to fit silver hydrofiber (Aquacel Ag+)-lawson # [id number]. Cover with silicone foam. Wrap from toes to knees with kerlix and ACE wrap while inpatient. Change daily.   08/05/21 1055            Disposition and follow-up:   [name] was discharged from [facility] in Stable condition.  At the hospital follow up visit please address:  1.  Patient will most likely need continued follow-up with wound care following rehab.  Please have patient take Keflex until 08/09/2021.  2. Further evaluation of normocytic anemia  3.  Labs / imaging needed at time of follow-up: CBC  4.  Pending labs/ test needing follow-up: none  Follow-up Appointments:  Follow-up with [facility] Course by problem list: 1. Cellulitis refractory to oral antibiotics Patient with past medical history of traumatic brain injury with cognitive deficits and chronic venous stasis changes of both legs presented from wound care center due to worsening of his non-purulent cellulitis over a chronic venous stasis  ulcer that was refractory to oral antibiotics.  Patient is a [facility] patient and lives by himself.  He reported increased pain and difficulty with ambulation due to his ulcer.  He follows with [place] wound care center, where he was treated from January through March for a similar venous stasis ulcer.  He had a biopsy of ulcer which ruled out squamous cell carcinoma or pyoderma, showed only stasis changes.  His legal guardian, [name], stated that this was improved since March but that the ulcer had worsened again in August.  Patient was initiated on Bactrim outpatient due to increasing  pain and erythema.  X-ray showed moderate severity diffuse soft tissue swelling within the region of the right ankle.  No signs concerning for osteomyelitis.  He was initiated on IV cefazolin.  He received 4 days of IV antibiotics.  Patient reports improvement in pain in his leg.  He was able to work with physical therapy, but continued to have difficulty with ambulation.  Patient lives at home by himself and spends the day at the [facility] facility.  Will benefit from going to a skilled nursing facility for a short time to help with his recovery.  His IV antibiotics were switched to cephalexin 500 mg 4 times a day until August 09, 2021.  After that he will need to follow-up with wound care center for his chronic venous stasis ulcer.  Discharge Exam:   BP 108/73 (BP Location: Left Arm)   Pulse 64   Temp 98.1 F (36.7 C) (Oral)   Resp 18   Ht '5\' 7"'$  (1.702 m)   Wt 81.6 kg   SpO2 96%   BMI 28.19 kg/m  Discharge exam:  Constitutional: well-developed, well-nourished HENT: NCAT Eyes: No scleral icterus, conjunctival clear Cardiovascular: No Murmurs, rubs, or gallops Pulm: Clear to auscultation bilaterally, pulmonary effort normal GI: No tenderness, sounds present MSK: Wound is covered with dressing and compression wrap Skin: warm and dry Psych: Normal mood and affect, cognitively impaired  Pertinent Labs, Studies, and Procedures:  CBC Latest Ref Rng & Units 08/05/2021 08/04/2021 08/03/2021  WBC 4.0 - 10.5 K/uL 4.8 5.2 6.5  Hemoglobin 13.0 - 17.0 g/dL 11.0(L) 10.8(L) 10.5(L)  Hematocrit 39.0 - 52.0 % 33.4(L) 33.3(L) 32.9(L)  Platelets 150 - 400 K/uL 215 214 198    BMP Latest Ref Rng & Units 08/05/2021 08/04/2021 08/03/2021  Glucose 70 - 99 mg/dL 134(H) 109(H) 128(H)  BUN 8 - 23 mg/dL '17 17 17  '$ Creatinine 0.61 - 1.24 mg/dL 0.77 1.00 1.22  Sodium 135 - 145 mmol/L 138 138 134(L)  Potassium 3.5 - 5.1 mmol/L 3.6 4.4 4.2  Chloride 98 - 111 mmol/L 103 107 102  CO2 22 - 32 mmol/L '26 27 25  '$ Calcium  8.9 - 10.3 mg/dL 8.8(L) 8.7(L) 8.5(L)    X-rat Tibia/ Fibula right IMPRESSION: Moderate severity diffuse soft tissue swelling within the region of the right ankle  Discharge Instructions: Discharge Instructions     Diet - low sodium heart healthy   Complete by: As directed    Discharge instructions   Complete by: As directed    Mr. Wibbenmeyer, You were admitted here due to your infection on your leg.  This has gotten better with IV antibiotic.  We are switching you to a antibiotic pill.  You will need to take this four times a day until 08/09/2021.  Please also continue dressing your wound and keeping it clean and dry. Thank you for letting us take part in your care.  Discharge wound care:   Complete by: As directed    Cut to fit silver hydrofiber (Aquacel Ag+)-lawson # [id number]. Cover with silicone foam. Wrap from toes to knees with kerlix and ACE wrap while inpatient. Change daily.   Increase activity slowly   Complete by: As directed        Signed: Christiana Fuchs, DO 08/05/2021, 11:33 AM   Pager:

## 2021-08-05 NOTE — TOC Transition Note (Signed)
Transition of Care [facility]) - CM/SW Discharge Note   Patient Details  Name: [name] MRN: [id number] Date of Birth: [birth year]  Transition of Care [facility]) CM/SW Contact:  Emeterio Reeve, LCSW Phone Number: 08/05/2021, 2:31 PM   Clinical Narrative:     Patient will DC to: [facility] Anticipated DC date: 08/05/21 Family notified: Legal guardian Transport by: [facility]     Per MD patient ready for DC to [facility]. RN, patient, patient's family, and facility notified of DC. Discharge Summary and FL2 sent to facility. DC packet on chart. Insurance Josem Kaufmann has been received and pt is covid negative. Ambulance transport requested for patient.    RN to call report to [phone].  CSW will sign off for now as social work intervention is no longer needed. Please consult [place] again if new needs arise.   Final next level of care: Skilled Nursing Facility Barriers to Discharge: Barriers Resolved   Patient Goals and CMS Choice     Choice offered to / list presented to : Patient  Discharge Placement              Patient chooses bed at: [facility] Patient to be transferred to facility by: [facility] will transport Name of family member notified: legal guardian Patient and family notified of of transfer: 08/05/21  Discharge Plan and Services   Discharge Planning Services: CM Consult              DME Agency: NA       HH Arranged: NA          Social Determinants of Health (SDOH) Interventions     Readmission Risk Interventions No flowsheet data found.  Emeterio Reeve, LCSW Clinical Social Worker

## 2021-08-07 LAB — CULTURE, BLOOD (ROUTINE X 2)
Culture: NO GROWTH
Culture: NO GROWTH
Special Requests: ADEQUATE
Special Requests: ADEQUATE

## 2021-10-21 ENCOUNTER — Emergency Department (HOSPITAL_COMMUNITY): Payer: Medicare (Managed Care)

## 2021-10-21 ENCOUNTER — Inpatient Hospital Stay (HOSPITAL_COMMUNITY)
Admission: EM | Admit: 2021-10-21 | Discharge: 2021-10-26 | DRG: 603 | Disposition: A | Discharge status: Skilled Nursing Facility | Payer: Medicare (Managed Care) | Source: Skilled Nursing Facility | Attending: Internal Medicine | Admitting: Internal Medicine

## 2021-10-21 ENCOUNTER — Encounter (HOSPITAL_COMMUNITY): Payer: Self-pay

## 2021-10-21 ENCOUNTER — Other Ambulatory Visit: Payer: Self-pay

## 2021-10-21 DIAGNOSIS — Y92009 Unspecified place in unspecified non-institutional (private) residence as the place of occurrence of the external cause: Secondary | ICD-10-CM

## 2021-10-21 DIAGNOSIS — W1830XA Fall on same level, unspecified, initial encounter: Secondary | ICD-10-CM | POA: Diagnosis present

## 2021-10-21 DIAGNOSIS — S62622A Displaced fracture of medial phalanx of right middle finger, initial encounter for closed fracture: Secondary | ICD-10-CM | POA: Diagnosis present

## 2021-10-21 DIAGNOSIS — E440 Moderate protein-calorie malnutrition: Secondary | ICD-10-CM | POA: Diagnosis present

## 2021-10-21 DIAGNOSIS — J45901 Unspecified asthma with (acute) exacerbation: Secondary | ICD-10-CM | POA: Diagnosis present

## 2021-10-21 DIAGNOSIS — I10 Essential (primary) hypertension: Secondary | ICD-10-CM | POA: Diagnosis present

## 2021-10-21 DIAGNOSIS — S62609A Fracture of unspecified phalanx of unspecified finger, initial encounter for closed fracture: Secondary | ICD-10-CM

## 2021-10-21 DIAGNOSIS — Z87820 Personal history of traumatic brain injury: Secondary | ICD-10-CM

## 2021-10-21 DIAGNOSIS — S069X9D Unspecified intracranial injury with loss of consciousness of unspecified duration, subsequent encounter: Secondary | ICD-10-CM

## 2021-10-21 DIAGNOSIS — S0181XA Laceration without foreign body of other part of head, initial encounter: Secondary | ICD-10-CM | POA: Diagnosis present

## 2021-10-21 DIAGNOSIS — L03115 Cellulitis of right lower limb: Secondary | ICD-10-CM | POA: Diagnosis not present

## 2021-10-21 DIAGNOSIS — Z20822 Contact with and (suspected) exposure to covid-19: Secondary | ICD-10-CM | POA: Diagnosis present

## 2021-10-21 DIAGNOSIS — R0602 Shortness of breath: Secondary | ICD-10-CM

## 2021-10-21 DIAGNOSIS — K219 Gastro-esophageal reflux disease without esophagitis: Secondary | ICD-10-CM | POA: Diagnosis present

## 2021-10-21 DIAGNOSIS — I878 Other specified disorders of veins: Secondary | ICD-10-CM | POA: Diagnosis present

## 2021-10-21 DIAGNOSIS — L0390 Cellulitis, unspecified: Secondary | ICD-10-CM | POA: Diagnosis present

## 2021-10-21 DIAGNOSIS — S069XAA Unspecified intracranial injury with loss of consciousness status unknown, initial encounter: Secondary | ICD-10-CM | POA: Diagnosis present

## 2021-10-21 DIAGNOSIS — S62624A Displaced fracture of medial phalanx of right ring finger, initial encounter for closed fracture: Secondary | ICD-10-CM | POA: Diagnosis present

## 2021-10-21 DIAGNOSIS — L97819 Non-pressure chronic ulcer of other part of right lower leg with unspecified severity: Secondary | ICD-10-CM | POA: Diagnosis present

## 2021-10-21 DIAGNOSIS — M549 Dorsalgia, unspecified: Secondary | ICD-10-CM

## 2021-10-21 DIAGNOSIS — Z6828 Body mass index (BMI) 28.0-28.9, adult: Secondary | ICD-10-CM

## 2021-10-21 DIAGNOSIS — Z833 Family history of diabetes mellitus: Secondary | ICD-10-CM

## 2021-10-21 DIAGNOSIS — R600 Localized edema: Secondary | ICD-10-CM | POA: Diagnosis not present

## 2021-10-21 LAB — CBC WITH DIFFERENTIAL/PLATELET
Abs Immature Granulocytes: 0.02 10*3/uL (ref 0.00–0.07)
Basophils Absolute: 0 10*3/uL (ref 0.0–0.1)
Basophils Relative: 1 %
Eosinophils Absolute: 0.2 10*3/uL (ref 0.0–0.5)
Eosinophils Relative: 3 %
HCT: 35.9 % — ABNORMAL LOW (ref 39.0–52.0)
Hemoglobin: 11.3 g/dL — ABNORMAL LOW (ref 13.0–17.0)
Immature Granulocytes: 0 %
Lymphocytes Relative: 18 %
Lymphs Abs: 1.1 10*3/uL (ref 0.7–4.0)
MCH: 28.3 pg (ref 26.0–34.0)
MCHC: 31.5 g/dL (ref 30.0–36.0)
MCV: 89.8 fL (ref 80.0–100.0)
Monocytes Absolute: 0.5 10*3/uL (ref 0.1–1.0)
Monocytes Relative: 7 %
Neutro Abs: 4.7 10*3/uL (ref 1.7–7.7)
Neutrophils Relative %: 71 %
Platelets: 230 10*3/uL (ref 150–400)
RBC: 4 MIL/uL — ABNORMAL LOW (ref 4.22–5.81)
RDW: 14.6 % (ref 11.5–15.5)
WBC: 6.5 10*3/uL (ref 4.0–10.5)
nRBC: 0 % (ref 0.0–0.2)

## 2021-10-21 LAB — RESP PANEL BY RT-PCR (FLU A&B, COVID) ARPGX2
Influenza A by PCR: NEGATIVE
Influenza B by PCR: NEGATIVE
SARS Coronavirus 2 by RT PCR: NEGATIVE

## 2021-10-21 LAB — BASIC METABOLIC PANEL
Anion gap: 7 (ref 5–15)
BUN: 14 mg/dL (ref 8–23)
CO2: 25 mmol/L (ref 22–32)
Calcium: 8.6 mg/dL — ABNORMAL LOW (ref 8.9–10.3)
Chloride: 105 mmol/L (ref 98–111)
Creatinine, Ser: 0.89 mg/dL (ref 0.61–1.24)
GFR, Estimated: >60 mL/min (ref >60)
Glucose, Bld: 85 mg/dL (ref 70–99)
Potassium: 3.6 mmol/L (ref 3.5–5.1)
Sodium: 137 mmol/L (ref 135–145)

## 2021-10-21 MED ORDER — VANCOMYCIN HCL IN DEXTROSE 1-5 GM/200ML-% IV SOLN
1000.0000 mg | Freq: Once | INTRAVENOUS | Status: AC
  Administered 2021-10-21: 1000 mg via INTRAVENOUS
  Filled 2021-10-21: qty 200

## 2021-10-21 MED ORDER — MORPHINE SULFATE (PF) 4 MG/ML IV SOLN
4.0000 mg | Freq: Once | INTRAVENOUS | Status: AC
Start: 2021-10-21 — End: 2021-10-21
  Administered 2021-10-21: 4 mg via INTRAVENOUS
  Filled 2021-10-21: qty 1

## 2021-10-21 NOTE — ED Notes (Signed)
Ortho tech paged. Ortho stated they had trauma on the way in and then they would be over

## 2021-10-21 NOTE — H&P (Addendum)
History and Physical    [name] KGM:[id number] DOB: 12/22/1950 DOA: 10/21/2021  PCP: Janifer Adie, MD  Patient coming from: SNF  I have personally briefly reviewed patient's old medical records in [place]  Chief Complaint: Fall  HPI: [name] is a 70 y.o. male with medical history significant for chronic venous stasis, TBI with cognitive impairment, hypertension and asthma who presents for fall.  Patient is a limited historian possibly due to history of TBI. Reports walking up stairs today with pain and fell approximately 10 steps down the stairs hurting his right hand and mid-back.  Also has a chronic non-healing wound to right LE follows with wound care. Has been draining more and more painful. States he was told he had fever yesterday. No nausea.   ED Course: He was afebrile and normotensive on room air.  No leukocytosis.  Hemoglobin of 11.  Sodium of 137, K of 3.6, creatinine of 0.89, BG of 134.  X-ray of the right hand revealed oblique fracture of shaft and base of the right middle ring finger.  Hand surgeon was consulted by ED PA who recommended splint placement.  He was started on IV vancomycin for right lower extremity cellulitis.  Review of Systems:  Pertinent positives and negatives as above.  Unable to obtain for ROS due to likely cognitive impairment  Past Medical History:  Diagnosis Date   Alcohol abuse    Arthritis    Asthma    Brain tumor ([place])    Bronchitis    GERD (gastroesophageal reflux disease)    Hypertension    Hypertensive retinopathy    OU   Obesity    Traumatic brain injury    Varicose veins     Past Surgical History:  Procedure Laterality Date   CATARACT EXTRACTION Bilateral 2018   Dr. Herbert Deaner   EYE SURGERY Bilateral 2018   Cat Sx - Dr. Herbert Deaner   HEMORRHOID SURGERY       reports that he has never smoked. He has never used smokeless tobacco. He reports current alcohol use. He reports that he does not use  drugs. Social History  No Known Allergies  Family History  Problem Relation Age of Onset   Diabetes Father      Prior to Admission medications   Medication Sig Start Date End Date Taking? Authorizing Provider  acetaminophen (TYLENOL) 500 MG tablet Take 500 mg by mouth every 8 (eight) hours as needed for headache or mild pain. 06/14/15   [provider]  albuterol (PROVENTIL HFA;VENTOLIN HFA) 108 (90 BASE) MCG/ACT inhaler Inhale 2 puffs into the lungs every 4 (four) hours as needed for wheezing. Patient taking differently: Inhale 1 puff into the lungs every 4 (four) hours as needed for wheezing. 11/30/11 08/04/21  Samuella Cota, MD  atorvastatin (LIPITOR) 20 MG tablet Take 20 mg by mouth daily.    [provider]  budesonide-formoterol (SYMBICORT) 160-4.5 MCG/ACT inhaler Inhale 2 puffs into the lungs 2 (two) times daily.    [provider]  chlorhexidine (PERIDEX) 0.12 % solution Use as directed 15 mLs in the mouth or throat 2 (two) times daily.    [provider]  hydrocortisone 2.5 % cream Apply 1 application topically See admin instructions. Apply thin layer around perimeter of wound on MON, TUE, THU, and FRI.    [provider]  loperamide (IMODIUM A-D) 2 MG tablet Take 2 mg by mouth as needed for diarrhea or loose stools. Do not exceed  more than 8mg  or 4 tablets within 24 hours.    [provider]  losartan (COZAAR) 50 MG tablet Take 50 mg by mouth daily.    [provider]  mirabegron ER (MYRBETRIQ) 25 MG TB24 tablet Take 25 mg by mouth daily.    [provider]  Sodium Fluoride (PREVIDENT 5000 BOOSTER PLUS) 1.1 % PSTE Place 1 application onto teeth at bedtime.    [provider]  tamsulosin (FLOMAX) 0.4 MG CAPS capsule Take 0.4 mg by mouth daily. 02/23/16   [provider]  Vitamin D, Ergocalciferol, (DRISDOL) 1.25 MG (50000 UNIT) CAPS capsule Take 50,000 Units by mouth every 30 (thirty) days.     [provider]    Physical Exam: Vitals:   10/21/21 2058 10/21/21 2126 10/21/21 2200 10/21/21 2230  BP: 123/78 129/65  129/72  Pulse: 73 74  77  Resp: 16 18  18   Temp:   97.7 F (36.5 C)   TempSrc:   Oral   SpO2: 100% 99%  100%  Weight:      Height:        Constitutional: NAD, calm, comfortable, non-toxic appearing elderly male laying in bed  Vitals:   10/21/21 2058 10/21/21 2126 10/21/21 2200 10/21/21 2230  BP: 123/78 129/65  129/72  Pulse: 73 74  77  Resp: 16 18  18   Temp:   97.7 F (36.5 C)   TempSrc:   Oral   SpO2: 100% 99%  100%  Weight:      Height:       Eyes: lids and conjunctivae normal ENMT: Mucous membranes are moist. Neck: normal, supple Respiratory: clear to auscultation bilaterally, no wheezing, no crackles. Normal respiratory effort.  Cardiovascular: Regular rate and rhythm, no murmurs / rubs / gallops. No extremity edema. 2+ pedal pulses.  Abdomen: no tenderness, no masses palpated. Bowel sounds positive.  Musculoskeletal: no clubbing / cyanosis. Normal muscle tone. Discolorated right middle ring finger with large bruising hematoma to DIP joint and swelling throughout Skin: Large right LE wound with purulent drainage and surround erythema worse than baseline chronic venous stasis to the left     Neurologic: CN 2-12 grossly intact. Sensation intact,  Strength 5/5 in all 4.  Psychiatric: Alert and oriented x 3. Normal mood.    Labs on Admission: I have personally reviewed following labs and imaging studies  CBC: Recent Labs  Lab 10/21/21 2035  WBC 6.5  NEUTROABS 4.7  HGB 11.3*  HCT 35.9*  MCV 89.8  PLT 026   Basic Metabolic Panel: Recent Labs  Lab 10/21/21 2035  NA 137  K 3.6  CL 105  CO2 25  GLUCOSE 85  BUN 14  CREATININE 0.89  CALCIUM 8.6*   GFR: Estimated Creatinine Clearance: 81.9 mL/min (by C-G formula based on SCr of 0.89 mg/dL). Liver Function Tests: No results for input(s): AST, ALT, ALKPHOS, BILITOT, PROT,  ALBUMIN in the last 168 hours. No results for input(s): LIPASE, AMYLASE in the last 168 hours. No results for input(s): AMMONIA in the last 168 hours. Coagulation Profile: No results for input(s): INR, PROTIME in the last 168 hours. Cardiac Enzymes: No results for input(s): CKTOTAL, CKMB, CKMBINDEX, TROPONINI in the last 168 hours. BNP (last 3 results) No results for input(s): PROBNP in the last 8760 hours. HbA1C: No results for input(s): HGBA1C in the last 72 hours. CBG: No results for input(s): GLUCAP in the last 168 hours. Lipid Profile: No results for input(s): CHOL, HDL, LDLCALC, TRIG, CHOLHDL, LDLDIRECT  in the last 72 hours. Thyroid Function Tests: No results for input(s): TSH, T4TOTAL, FREET4, T3FREE, THYROIDAB in the last 72 hours. Anemia Panel: No results for input(s): VITAMINB12, FOLATE, FERRITIN, TIBC, IRON, RETICCTPCT in the last 72 hours. Urine analysis:    Component Value Date/Time   COLORURINE AMBER (A) 02/07/2021 1641   APPEARANCEUR HAZY (A) 02/07/2021 1641   LABSPEC 1.039 (H) 02/07/2021 1641   PHURINE 5.0 02/07/2021 1641   GLUCOSEU NEGATIVE 02/07/2021 1641   HGBUR NEGATIVE 02/07/2021 1641   BILIRUBINUR NEGATIVE 02/07/2021 1641   KETONESUR 5 (A) 02/07/2021 1641   PROTEINUR 30 (A) 02/07/2021 1641   UROBILINOGEN 0.2 12/09/2010 1407   NITRITE NEGATIVE 02/07/2021 1641   LEUKOCYTESUR NEGATIVE 02/07/2021 1641    Radiological Exams on Admission: DG Tibia/Fibula Right  Result Date: 10/21/2021 CLINICAL DATA:  Open wounds along the medial ankle over the past year EXAM: RIGHT TIBIA AND FIBULA - 2 VIEW COMPARISON:  08/01/2021 FINDINGS: Subcutaneous edema is present throughout the calf, especially the mid calf and upper calf regions. Plantar calcaneal spur.  Mild degenerative hindfoot findings. Lucency medially in the talar dome favoring non-fragmented osteochondral lesion. No findings of active osteomyelitis in the tibia or fibula. IMPRESSION: 1. Subcutaneous edema persists  in the calf. 2. No findings of osteomyelitis. 3. 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 Electronically Signed   By: Van Clines M.D.   On: 10/21/2021 20:58   DG Ankle 2 Views Right  Result Date: 10/21/2021 CLINICAL DATA:  Open wounds along the medial ankle for 1 year EXAM: RIGHT ANKLE - 2 VIEW COMPARISON:  Radiographs of 08/01/2021 FINDINGS: Subcutaneous edema is again observed overlying the medial and lateral malleoli. Off axis lateral view of the ankle causes some distortion of structures and obscures the subtalar joints and calcaneocuboid articulation. However, this is felt to be projectional in nature. Dorsal subcutaneous edema tracking into the forefoot appears mildly increased from 08/01/2021. Plantar calcaneal spur noted. Lucency medially along the talar dome suspicious for a chronic non-fragmented osteochondral lesion. No bony destructive findings characteristic of osteomyelitis. IMPRESSION: 1. No findings of osteomyelitis on conventional radiographs. 2. Persistent subcutaneous edema along the malleoli. Mild increase in the subcutaneous edema tracking in the dorsum of the foot. 3. 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 4. Plantar calcaneal spur. Electronically Signed   By: Van Clines M.D.   On: 10/21/2021 21:00   CT Head Wo Contrast  Result Date: 10/21/2021 CLINICAL DATA:  Head laceration. EXAM: CT HEAD WITHOUT CONTRAST TECHNIQUE: Contiguous axial images were obtained from the base of the skull through the vertex without intravenous contrast. COMPARISON:  None. FINDINGS: Brain: Old right frontal infarction is noted. No mass effect or midline shift is noted. Ventricular size is within normal limits. There is no evidence of mass lesion, hemorrhage or acute infarction. Vascular: No hyperdense vessel or unexpected calcification. Skull: Normal. Negative for fracture or focal lesion. Sinuses/Orbits: No acute finding.  Other: None. IMPRESSION: No acute intracranial abnormality seen. Electronically Signed   By: Marijo Conception M.D.   On: 10/21/2021 12:22   DG Hand Complete Right  Result Date: 10/21/2021 CLINICAL DATA:  Ring finger pain after fall EXAM: RIGHT HAND - COMPLETE 3+ VIEW COMPARISON:  None. FINDINGS: Oblique fracture of the middle phalanx of the ring finger extends from the anterior midshaft into the central articular surface of the base of the phalanx. There is 0.4 cm of distraction at the fracture plane. No other fracture is identified.  IMPRESSION: 1. Oblique fracture of the shaft and base of the middle phalanx ring finger, with 4 mm distraction at the fracture plane. Electronically Signed   By: Van Clines M.D.   On: 10/21/2021 21:03      Assessment/Plan  Right lower extremity wound cellulitis History of chronic venous stasis with chronic non-healing wound -Patient with large right lower extremity purulent wound with surrounding erythema worse compared to the left LE. See photo.  Suspect this is infected rather than just venous stasis -continue IV vancomycin-had wound cultures in March with staphyloccus -wound consult   Right middle ring finger fx oblique fracture of shaft and base  Hand surgery consulted and recommend volar splint extending up to forearm  Thoracic back pain following mechanical fall -obtain Thoracic X-ray   HTN controlled  Hx of TBI only able to provided limited hx   DVT prophylaxis:.Lovenox Code Status: Full Family Communication: Plan discussed with patient at bedside  disposition Plan: SNF with observation Consults called:  Admission status: Observation  Needs med rec verified by nursing home in the morning  Level of care: Med-Surg  Status is: Observation  The patient remains OBS appropriate and will d/c before 2 midnights.        Orene Desanctis DO [facility]   If 7PM-7AM, please contact night-coverage [email or web address]   10/21/2021,  10:48 PM

## 2021-10-21 NOTE — ED Provider Notes (Signed)
Emergency Medicine Provider Triage Evaluation Note  [name] , a 70 y.o. male  was evaluated in triage.  Pt complains of fall and hitting his head.  Pt reports wound on his right leg is draining   Review of Systems  Positive: headache Negative: Cough or illness  Physical Exam  BP 139/79 (BP Location: Left Arm)   Pulse 84   Temp 98 F (36.7 C) (Oral)   Resp 18   Ht 5' 7.5" (1.715 m)   Wt 83.9 kg   SpO2 98%   BMI 28.55 kg/m  Gen:   Awake, no distress   Resp:  Normal effort  MSK:   Moves extremities without difficulty  Other:    Medical Decision Making  Medically screening exam initiated at 12:10 PM.  Appropriate orders placed.  JORDELL OUTTEN was informed that the remainder of the evaluation will be completed by another provider, this initial triage assessment does not replace that evaluation, and the importance of remaining in the ED until their evaluation is complete.     [name] 10/21/21 1212    [name], MD 10/21/21 1218

## 2021-10-21 NOTE — ED Provider Notes (Signed)
[facility] DEPT Provider Note   CSN: [id number] Arrival date & time: 10/21/21  1133     History Chief Complaint  Patient presents with   Fall   Head Injury    CAMERIN LADOUCEUR is a 70 y.o. male.  The history is provided by the patient and medical records. The history is limited by a language barrier. No language interpreter was used.  Fall  Head Injury  70 year old male significant history of prior traumatic brain injury, alcohol abuse, brain tumor, hypertension, who presents for evaluation of a fall.  Is very difficult to understand due to his speech.  Patient reported he has a wound to his right lower extremity for the past year.  He complained that it drains more than usual.  He is unable to tell me any specific timeline but does endorse increasing pain.  Last dressing was changed yesterday.  States that it was fluid often.  Furthermore, patient reported he missed stepped and fell yesterday.  He did hit the top of his head and injured his right hand from the fall but denies any loss of consciousness.  He reports tenderness to the occiput of the scalp mild to moderate in severity.  He denies any neck pain chest pain trouble breathing abdominal pain any focal numbness or focal weakness or dysuria.  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 Herbert Deaner   EYE SURGERY Bilateral 2018   Cat Sx - Dr. Herbert Deaner   HEMORRHOID SURGERY         Family History  Problem Relation Age of Onset   Diabetes Father     Social History   Tobacco Use   Smoking status: Never   Smokeless tobacco: Never  Vaping Use   Vaping Use: Never used  Substance Use Topics   Alcohol use: Yes   Drug use: No    Home Medications Prior to Admission medications   Medication Sig Start Date End Date Taking? Authorizing Provider  acetaminophen (TYLENOL) 500 MG tablet Take 500 mg by mouth every 8 (eight) hours as needed for headache or mild pain. 06/14/15   [provider]  albuterol (PROVENTIL HFA;VENTOLIN HFA) 108 (90 BASE) MCG/ACT inhaler Inhale 2 puffs into the lungs every 4 (four) hours as needed for wheezing. Patient taking differently: Inhale 1 puff into the lungs every 4 (four) hours as needed for wheezing. 11/30/11 08/04/21  Samuella Cota, MD  atorvastatin (LIPITOR) 20 MG tablet Take 20 mg by mouth daily.    [provider]  budesonide-formoterol (SYMBICORT) 160-4.5 MCG/ACT inhaler Inhale 2 puffs into the lungs 2 (two) times daily.  [provider]  chlorhexidine (PERIDEX) 0.12 % solution Use as directed 15 mLs in the mouth or throat 2 (two) times daily.    [provider]  hydrocortisone 2.5 % cream Apply 1 application topically See admin instructions. Apply thin layer around perimeter of wound on MON, TUE, THU, and FRI.    [provider]  loperamide (IMODIUM A-D) 2 MG tablet Take 2 mg by mouth as needed for diarrhea or loose stools. Do not exceed more than 8mg  or 4 tablets within 24 hours.    [provider]  losartan (COZAAR) 50 MG tablet Take 50 mg by mouth daily.    [provider]  mirabegron ER (MYRBETRIQ) 25  MG TB24 tablet Take 25 mg by mouth daily.    [provider]  Sodium Fluoride (PREVIDENT 5000 BOOSTER PLUS) 1.1 % PSTE Place 1 application onto teeth at bedtime.    [provider]  tamsulosin (FLOMAX) 0.4 MG CAPS capsule Take 0.4 mg by mouth daily. 02/23/16   [provider]  Vitamin D, Ergocalciferol, (DRISDOL) 1.25 MG (50000 UNIT) CAPS capsule Take 50,000 Units by mouth every 30 (thirty) days.    [provider]    Allergies    Patient has no known allergies.  Review of Systems   Review of Systems  All other systems reviewed and are negative.  Physical Exam Updated Vital Signs BP 120/71 (BP Location: Left Arm)   Pulse 63   Temp 98 F (36.7 C) (Oral)   Resp 20   Ht 5' 7.5" (1.715 m)   Wt 83.9 kg   SpO2 99%   BMI 28.55 kg/m   Physical Exam Vitals and nursing note reviewed.  Constitutional:      General: He is not in acute distress.    Appearance: He is well-developed.     Comments: Elderly male appears to be in no acute discomfort.  Dysarthric speech difficult to understand.  HENT:     Head: Normocephalic.     Comments: Superficial 1 cm laceration noted to frontal scalp with dried blood noted no crepitus.  Mildly tender to palpation.  No midface tenderness no battle signs or raccoon's eyes Eyes:     Extraocular Movements: Extraocular movements intact.     Conjunctiva/sclera: Conjunctivae normal.     Pupils: Pupils are equal, round, and reactive to light.  Cardiovascular:     Rate and Rhythm: Normal rate and regular rhythm.     Pulses: Normal pulses.     Heart sounds: Normal heart sounds.  Pulmonary:     Breath sounds: Normal breath sounds. No wheezing or rhonchi.  Abdominal:     Palpations: Abdomen is soft.     Tenderness: There is no abdominal tenderness.  Musculoskeletal:        General: Signs of injury (Right hand: Ecchymosis noted to fourth digit with tenderness to palpation and associated edema but no obvious deformity) present.      Cervical back: Normal range of motion and neck supple. No tenderness.  Skin:    Findings: No rash.     Comments: Right lower extremity: Diffuse skin erythema and warmth appreciated to right lower tib-fib with large ulceration to medial distal tib-fib oozing out purulent discharge.  Neurological:     Mental Status: He is alert. Mental status is at baseline.  Psychiatric:        Mood and Affect: Mood normal.       ED Results / Procedures / Treatments   Labs (all  labs ordered are listed, but only abnormal results are displayed) Labs Reviewed - No data to display  EKG None  Radiology CT Head Wo Contrast  Result Date: 10/21/2021 CLINICAL DATA:  Head laceration. EXAM: CT HEAD WITHOUT CONTRAST TECHNIQUE: Contiguous axial images were obtained from the base of the skull through the vertex without intravenous contrast. COMPARISON:  None. FINDINGS: Brain: Old right frontal infarction is noted. No mass effect or midline shift is noted. Ventricular size is within normal limits. There is no evidence of mass lesion, hemorrhage or acute infarction. Vascular: No hyperdense vessel or unexpected calcification. Skull: Normal. Negative for fracture or focal lesion. Sinuses/Orbits: No acute finding. Other: None. IMPRESSION: No acute intracranial abnormality seen. Electronically Signed   By: Marijo Conception M.D.   On: 10/21/2021 12:22    Procedures Procedures   Medications Ordered in ED Medications - No data to display  ED Course  I have reviewed the triage vital signs and the nursing notes.  Pertinent labs & imaging results that were available during my care of the patient were reviewed by me and considered in my medical decision making (see chart for details).    MDM Rules/Calculators/A&P                           BP 110/68 (BP Location: Left Arm)   Pulse 73   Temp 97.9 F (36.6 C) (Oral)   Resp 20   Ht 5' 7.5" (1.715 m)   Wt 83.9 kg   SpO2 98%   BMI 28.55 kg/m   Final Clinical  Impression(s) / ED Diagnoses Final diagnoses:  Back pain  SOB (shortness of breath)  Fracture of phalanx of finger    Rx / DC Orders ED Discharge Orders     None      8:06 PM Patient request was to evaluate a chronic wound that he has on his right lower extremity.  He has been cared for by his facility and dressing was last changed yesterday.  I remove the dressing, the signs of chronic venous stasis skin changes as well as purulent discharge noted to the wound and surrounding skin ulceration to the medial distal tib-fib.  Will obtain x-ray to assess for potential osteo-.  9:55 PM X-ray of the right hand demonstrate an oblique fracture of the shaft and base of the middle phalanx ring finger with a 4 mm distraction at the fracture plane.  This is a closed injury, will place finger in a finger splint, and will consult hand specialist.  X-ray of the right tib-fib and ankle shows subcutaneous edema but no evidence of osteomyelitis.  10:12 PM I have consulted with hand specialist, Dr. Grandville Silos who recommended against using aluminum finger splint but instead to use a volar splint that extends to cover the fourth and fifth digit on the right hand from the forearm.  We will order the appropriate splinting, will consult medicine for admission of his leg infection.  10:14 PM He has since patient has chronic venous stasis involving his lower extremities with chronic leg wounds requiring IV antibiotic as well as oral antibiotic in the past.  He has a follow-up with [facility] for treatment.  He was last admitted for same in September of this year.  10:47 PM Appreciate consultation from [place] DR. Tu who agrees to see and will admit pt for cellulitis treated with IV abx.     Domenic Moras, PA-C 10/23/21 2322  Milton Ferguson, MD 10/25/21 1037

## 2021-10-21 NOTE — ED Notes (Addendum)
Spoke with legal guardian Barnie Alderman given an update on patient status and plans to admit patient

## 2021-10-21 NOTE — ED Triage Notes (Addendum)
Per EMS- patient is a new patient at [facility] as of yesterday.  Patient fell at home yesterday and when he got to [facility], it was noted that he had a laceration to his head, a finger injury to the ring finger ( fractured). X-rays were done.  Facility wanted the patient to be evaluated. No blood thinners.    Noted in triage - Bandages on the right lower leg. Patient has swelling to the right lower leg as well. Patient states he has drainage all the time to his right leg.  Patient also reports that he fell down " a lot of steps and that his foot slipped."

## 2021-10-22 ENCOUNTER — Observation Stay (HOSPITAL_COMMUNITY): Payer: Medicare (Managed Care)

## 2021-10-22 DIAGNOSIS — W1830XA Fall on same level, unspecified, initial encounter: Secondary | ICD-10-CM | POA: Diagnosis present

## 2021-10-22 DIAGNOSIS — S0181XA Laceration without foreign body of other part of head, initial encounter: Secondary | ICD-10-CM | POA: Diagnosis present

## 2021-10-22 DIAGNOSIS — Z6828 Body mass index (BMI) 28.0-28.9, adult: Secondary | ICD-10-CM | POA: Diagnosis not present

## 2021-10-22 DIAGNOSIS — I10 Essential (primary) hypertension: Secondary | ICD-10-CM | POA: Diagnosis present

## 2021-10-22 DIAGNOSIS — L97819 Non-pressure chronic ulcer of other part of right lower leg with unspecified severity: Secondary | ICD-10-CM | POA: Diagnosis present

## 2021-10-22 DIAGNOSIS — I878 Other specified disorders of veins: Secondary | ICD-10-CM | POA: Diagnosis present

## 2021-10-22 DIAGNOSIS — E440 Moderate protein-calorie malnutrition: Secondary | ICD-10-CM | POA: Diagnosis present

## 2021-10-22 DIAGNOSIS — S62624A Displaced fracture of medial phalanx of right ring finger, initial encounter for closed fracture: Secondary | ICD-10-CM | POA: Diagnosis present

## 2021-10-22 DIAGNOSIS — J45901 Unspecified asthma with (acute) exacerbation: Secondary | ICD-10-CM | POA: Diagnosis present

## 2021-10-22 DIAGNOSIS — Z87820 Personal history of traumatic brain injury: Secondary | ICD-10-CM | POA: Diagnosis not present

## 2021-10-22 DIAGNOSIS — Z20822 Contact with and (suspected) exposure to covid-19: Secondary | ICD-10-CM | POA: Diagnosis present

## 2021-10-22 DIAGNOSIS — S62622A Displaced fracture of medial phalanx of right middle finger, initial encounter for closed fracture: Secondary | ICD-10-CM | POA: Diagnosis present

## 2021-10-22 DIAGNOSIS — R600 Localized edema: Secondary | ICD-10-CM | POA: Diagnosis present

## 2021-10-22 DIAGNOSIS — L03115 Cellulitis of right lower limb: Secondary | ICD-10-CM | POA: Diagnosis present

## 2021-10-22 DIAGNOSIS — Y92009 Unspecified place in unspecified non-institutional (private) residence as the place of occurrence of the external cause: Secondary | ICD-10-CM | POA: Diagnosis not present

## 2021-10-22 DIAGNOSIS — K219 Gastro-esophageal reflux disease without esophagitis: Secondary | ICD-10-CM | POA: Diagnosis present

## 2021-10-22 DIAGNOSIS — Z833 Family history of diabetes mellitus: Secondary | ICD-10-CM | POA: Diagnosis not present

## 2021-10-22 LAB — BASIC METABOLIC PANEL
Anion gap: 6 (ref 5–15)
BUN: 13 mg/dL (ref 8–23)
CO2: 25 mmol/L (ref 22–32)
Calcium: 8.5 mg/dL — ABNORMAL LOW (ref 8.9–10.3)
Chloride: 106 mmol/L (ref 98–111)
Creatinine, Ser: 0.75 mg/dL (ref 0.61–1.24)
GFR, Estimated: >60 mL/min (ref >60)
Glucose, Bld: 86 mg/dL (ref 70–99)
Potassium: 4.1 mmol/L (ref 3.5–5.1)
Sodium: 137 mmol/L (ref 135–145)

## 2021-10-22 LAB — CBC
HCT: 34.6 % — ABNORMAL LOW (ref 39.0–52.0)
Hemoglobin: 10.9 g/dL — ABNORMAL LOW (ref 13.0–17.0)
MCH: 28.5 pg (ref 26.0–34.0)
MCHC: 31.5 g/dL (ref 30.0–36.0)
MCV: 90.6 fL (ref 80.0–100.0)
Platelets: 203 10*3/uL (ref 150–400)
RBC: 3.82 MIL/uL — ABNORMAL LOW (ref 4.22–5.81)
RDW: 14.2 % (ref 11.5–15.5)
WBC: 6.8 10*3/uL (ref 4.0–10.5)
nRBC: 0 % (ref 0.0–0.2)

## 2021-10-22 LAB — URINALYSIS, ROUTINE W REFLEX MICROSCOPIC
Bilirubin Urine: NEGATIVE
Glucose, UA: NEGATIVE mg/dL
Hgb urine dipstick: NEGATIVE
Ketones, ur: 5 mg/dL — AB
Leukocytes,Ua: NEGATIVE
Nitrite: NEGATIVE
Protein, ur: NEGATIVE mg/dL
Specific Gravity, Urine: 1.019 (ref 1.005–1.030)
pH: 5 (ref 5.0–8.0)

## 2021-10-22 MED ORDER — ENOXAPARIN SODIUM 40 MG/0.4ML IJ SOSY
40.0000 mg | PREFILLED_SYRINGE | INTRAMUSCULAR | Status: DC
  Administered 2021-10-22 – 2021-10-26 (×5): 40 mg via SUBCUTANEOUS
  Filled 2021-10-22 (×5): qty 0.4

## 2021-10-22 MED ORDER — SODIUM CHLORIDE 0.9 % IV SOLN: INTRAVENOUS | Status: DC

## 2021-10-22 MED ORDER — TAMSULOSIN HCL 0.4 MG PO CAPS
0.4000 mg | ORAL_CAPSULE | Freq: Every day | ORAL | Status: DC
  Administered 2021-10-23 – 2021-10-26 (×4): 0.4 mg via ORAL
  Filled 2021-10-22 (×4): qty 1

## 2021-10-22 MED ORDER — OXYCODONE HCL 5 MG PO TABS
5.0000 mg | ORAL_TABLET | Freq: Four times a day (QID) | ORAL | Status: DC | PRN
  Administered 2021-10-22 – 2021-10-26 (×6): 5 mg via ORAL
  Filled 2021-10-22 (×6): qty 1

## 2021-10-22 MED ORDER — IPRATROPIUM-ALBUTEROL 0.5-2.5 (3) MG/3ML IN SOLN: 3.0000 mL | Freq: Four times a day (QID) | RESPIRATORY_TRACT | Status: DC

## 2021-10-22 MED ORDER — GUAIFENESIN ER 600 MG PO TB12
600.0000 mg | ORAL_TABLET | Freq: Two times a day (BID) | ORAL | Status: DC
  Administered 2021-10-22 – 2021-10-26 (×8): 600 mg via ORAL
  Filled 2021-10-22 (×8): qty 1

## 2021-10-22 MED ORDER — MIRABEGRON ER 25 MG PO TB24
25.0000 mg | ORAL_TABLET | Freq: Every day | ORAL | Status: DC
  Administered 2021-10-23 – 2021-10-26 (×4): 25 mg via ORAL
  Filled 2021-10-22 (×5): qty 1

## 2021-10-22 MED ORDER — VANCOMYCIN HCL IN DEXTROSE 1-5 GM/200ML-% IV SOLN
1000.0000 mg | Freq: Two times a day (BID) | INTRAVENOUS | Status: DC
  Administered 2021-10-22 – 2021-10-23 (×4): 1000 mg via INTRAVENOUS
  Filled 2021-10-22 (×5): qty 200

## 2021-10-22 MED ORDER — ATORVASTATIN CALCIUM 10 MG PO TABS
20.0000 mg | ORAL_TABLET | Freq: Every day | ORAL | Status: DC
  Administered 2021-10-23 – 2021-10-26 (×4): 20 mg via ORAL
  Filled 2021-10-22 (×4): qty 2

## 2021-10-22 MED ORDER — ACETAMINOPHEN 500 MG PO TABS
500.0000 mg | ORAL_TABLET | Freq: Three times a day (TID) | ORAL | Status: DC
  Administered 2021-10-22 – 2021-10-23 (×5): 500 mg via ORAL
  Filled 2021-10-22 (×5): qty 1

## 2021-10-22 MED ORDER — IPRATROPIUM-ALBUTEROL 0.5-2.5 (3) MG/3ML IN SOLN: 3.0000 mL | Freq: Four times a day (QID) | RESPIRATORY_TRACT | Status: DC | PRN

## 2021-10-22 MED ORDER — ACETAMINOPHEN 325 MG PO TABS
650.0000 mg | ORAL_TABLET | Freq: Four times a day (QID) | ORAL | Status: DC | PRN
  Administered 2021-10-25 (×3): 650 mg via ORAL
  Filled 2021-10-22 (×3): qty 2

## 2021-10-22 MED ORDER — MOMETASONE FURO-FORMOTEROL FUM 200-5 MCG/ACT IN AERO
2.0000 | INHALATION_SPRAY | Freq: Two times a day (BID) | RESPIRATORY_TRACT | Status: DC
  Administered 2021-10-23 – 2021-10-26 (×6): 2 via RESPIRATORY_TRACT
  Filled 2021-10-22: qty 8.8

## 2021-10-22 MED ORDER — HYDROCODONE-ACETAMINOPHEN 5-325 MG PO TABS: 1.0000 | ORAL_TABLET | Freq: Four times a day (QID) | ORAL | Status: DC | PRN

## 2021-10-22 MED ORDER — TRAMADOL HCL 50 MG PO TABS
50.0000 mg | ORAL_TABLET | Freq: Four times a day (QID) | ORAL | Status: DC | PRN
  Administered 2021-10-22 – 2021-10-25 (×5): 50 mg via ORAL
  Filled 2021-10-22 (×5): qty 1

## 2021-10-22 MED ORDER — MORPHINE SULFATE (PF) 2 MG/ML IV SOLN
2.0000 mg | Freq: Once | INTRAVENOUS | Status: AC
  Administered 2021-10-22: 2 mg via INTRAVENOUS
  Filled 2021-10-22: qty 1

## 2021-10-22 NOTE — Progress Notes (Signed)
Pharmacy Antibiotic Note  REX OESTERLE is a 70 y.o. male admitted on 10/21/2021 with medical history significant for chronic venous stasis, TBI with cognitive impairment, hypertension and asthma who presents for fall.  PT reported a wound to his right lower extremity for the past year.Pharmacy has been consulted to dose vancomycin for cellulitis.  Plan: Vancomycin 1gm IV x 1 then 1gm q12h (AUC 499.8, Scr 0.89) Follow renal function and clinical course Vanc levels as needed  Height: 5' 7.5" (171.5 cm) Weight: 83.9 kg (185 lb) IBW/kg (Calculated) : 67.25  Temp (24hrs), Avg:97.9 F (36.6 C), Min:97.7 F (36.5 C), Max:98 F (36.7 C)  Recent Labs  Lab 10/21/21 2035  WBC 6.5  CREATININE 0.89    Estimated Creatinine Clearance: 81.9 mL/min (by C-G formula based on SCr of 0.89 mg/dL).    No Known Allergies  Antimicrobials this admission: 12/2 vanc >>  Dose adjustments this admission:   Microbiology results:   Thank you for allowing pharmacy to be a part of this patient's care.  Dolly Rias RPh 10/22/2021, 2:08 AM

## 2021-10-22 NOTE — Progress Notes (Signed)
Orthopedic Tech Progress Note Patient Details:  [name] 12-Mar-1951 [id number]  Ortho Devices Type of Ortho Device: Ulna gutter splint Ortho Device/Splint Location: RUE Ortho Device/Splint Interventions: Ordered, Application, Adjustment   Post Interventions Patient Tolerated: Fair Instructions Provided: Care of device, Poper ambulation with device  [name] 10/22/2021, 2:07 AM

## 2021-10-22 NOTE — Assessment & Plan Note (Addendum)
Present on admission.   Chronic wound ongoing for last few months. Initially on IV vancomycin.  Transition to oral doxycycline. Improving redness and warmth. Pt should resume follow-up with the outpatient wound care center after discharge. Apply Aquacel to right leg wound Q day and cover with ABD pad and kerlex, then apply ace wrap

## 2021-10-22 NOTE — Progress Notes (Signed)
Patient with comminuted displaced fracture of base of P2 of R RF, potentially very unstable.  Will have it splinted in flexion to help with stability of PIPJ and get new xrays once it is splinted in flexion.  Can then determine whether to continue non-op tx or have further discussion with patient about pros/cons/timing of potential operative treatment.  Micheline Rough, MD Hand Surgery

## 2021-10-22 NOTE — Assessment & Plan Note (Addendum)
Exacerbation resolved. Continue inhalers and DuoNeb therapy.  Chest x-ray negative for any acute abnormality.  COVID and influenza negative.

## 2021-10-22 NOTE — Progress Notes (Signed)
[facility] Progress Note  Patient: [name]    HQP:[id number]  DOA: 10/21/2021    Date of Service: the patient was seen and examined on 10/22/2021  Brief hospital course: [name] is a 70 y.o. male with medical history significant for chronic venous stasis, TBI with cognitive impairment, hypertension and asthma who presents for fall.  Found to have right ring finger fracture and cellulitis of the right lower extremity.  Assessment and Plan: * Cellulitis of right lower leg Present on admission.  Chronic wound ongoing for last few months. Following up with wound care. Currently on IV vancomycin. Follow-up on cultures. Wound care consult.  Displaced fracture of middle phalanx of right middle finger, initial encounter for closed fracture Hand surgery consulted, recommend volar splint and pain control.  Outpatient follow-up.  Asthma, chronic, unspecified asthma severity, with acute exacerbation Will initiate inhalers and DuoNeb therapy.  Chest x-ray negative for any acute abnormality.  COVID and influenza negative.  TBI (traumatic brain injury) Prior history.  No acute issues for now.  HTN (hypertension) Blood pressure relatively controlled.  Monitor.    Body mass index is 28.55 kg/m.        Subjective: Continues to have pain in the right leg.  No nausea no vomiting.  Had some shortness of breath and cough.  No chest pain abdominal pain.  Objective: Tachypneic  Exam: General: Appear in mild distress, no Rash; Oral Mucosa dry. no Abnormal Neck Mass Or lumps, Conjunctiva normal  Cardiovascular: S1 and S2 Present, no Murmur Respiratory: increased respiratory effort, Bilateral Air entry present and no Crackles, bilateral expiratory wheezes Abdomen: Bowel Sound present, Soft and no tenderness Extremities: Bilateral pedal edema, right more than left, right lower extremity open wound with granulation tissue Neurology: alert and oriented to time, place, and  person affect appropriate. no new focal deficit Gait not checked due to patient safety concerns      Data Reviewed: My review of labs, imaging, notes and other tests is significant for     clear chest x-ray  Disposition:  Status is: Inpatient  Remains inpatient appropriate because: Requiring IV antibiotics for cellulitis prior history of staph for his infection at risk for MRSA.   Family Communication: None at bedside.  DVT Prophylaxis: enoxaparin (LOVENOX) injection 40 mg Start: 10/22/21 1000   Time spent: 35 minutes.   Author: Berle Mull  10/22/2021 5:10 PM  To reach On-call, see care teams to locate the attending and reach out via [email or web address]. Between 7PM-7AM, please contact night-coverage If you still have difficulty reaching the attending provider, please page the [facility] (Director on Call) for [facility] on amion for assistance.

## 2021-10-22 NOTE — Hospital Course (Signed)
[name] is a 70 y.o. male with medical history significant for chronic venous stasis, TBI with cognitive impairment, hypertension and asthma who presents for fall.  Found to have right ring finger fracture and cellulitis of the right lower extremity.

## 2021-10-22 NOTE — Assessment & Plan Note (Signed)
Blood pressure relatively controlled.  Monitor.

## 2021-10-22 NOTE — Progress Notes (Signed)
Orthopedic Tech Progress Note Patient Details:  [name] 1951/02/15 [id number]  Ortho Devices Type of Ortho Device: Ace wrap, Ulna gutter splint Ortho Device/Splint Location: replacement splint Ortho Device/Splint Interventions: Application   Post Interventions Patient Tolerated: Well Instructions Provided: Care of device  [name] 10/22/2021, 11:20 AM

## 2021-10-22 NOTE — Assessment & Plan Note (Signed)
Prior history.  No acute issues for now.

## 2021-10-22 NOTE — Progress Notes (Signed)
Orthopedic Tech Progress Note Patient Details:  [name] 1951-10-07 [id number]  Ortho Devices Type of Ortho Device: Ace wrap, Ulna gutter splint Ortho Device/Splint Location: right splint replacement, Patient has removed 2 splints today, Patient states he can not properly use the restroom with a splint on his hand. Ortho Device/Splint Interventions: Application   Post Interventions Patient Tolerated: Well Instructions Provided: Care of device  [name] 10/22/2021, 6:27 PM

## 2021-10-22 NOTE — Assessment & Plan Note (Addendum)
Hand surgery consulted, recommend splint and pain control.  Discussed with Dr. Grandville Silos, he is not planning for inpatient intervention and recommend outpatient follow-up.  Splint changed on 12/6.  Pain control with tramadol.

## 2021-10-23 ENCOUNTER — Inpatient Hospital Stay (HOSPITAL_COMMUNITY): Payer: Medicare (Managed Care)

## 2021-10-23 DIAGNOSIS — L03115 Cellulitis of right lower limb: Secondary | ICD-10-CM | POA: Diagnosis not present

## 2021-10-23 NOTE — Progress Notes (Signed)
[facility] Progress Note  Patient: ALPHONSO GREGSON    DSK:[id number]  DOA: 10/21/2021    Date of Service: the patient was seen and examined on 10/23/2021  Brief hospital course: TRENTYN BOISCLAIR is a 70 y.o. male with medical history significant for chronic venous stasis, TBI with cognitive impairment, hypertension and asthma who presents for fall.  Found to have right ring finger fracture and cellulitis of the right lower extremity.  Assessment and Plan: * Cellulitis of right lower leg Present on admission.  Chronic wound ongoing for last few months. Following up with wound care. Currently on IV vancomycin.  Improving redness and warmth.  Likely will transition to p.o. doxycycline tomorrow. Follow-up on cultures. Wound care consult.  Displaced fracture of middle phalanx of right middle finger, initial encounter for closed fracture Hand surgery consulted, recommend splint and pain control.  Hand surgery currently contemplating nonoperative therapy versus operative therapy.  Asthma, chronic, unspecified asthma severity, with acute exacerbation Will initiate inhalers and DuoNeb therapy.  Chest x-ray negative for any acute abnormality.  COVID and influenza negative.  TBI (traumatic brain injury) Prior history.  No acute issues for now.  HTN (hypertension) Blood pressure relatively controlled.  Monitor.    Body mass index is 28.55 kg/m.        Subjective: Continues to report generalized body ache back pain headache and right leg pain.  No nausea no vomiting no fever no chills.  Oral intake adequate.  Objective: Vital signs were reviewed and unremarkable.  Exam: General: Appear in mild distress, no Rash; Oral Mucosa clear. no Abnormal Neck Mass Or lumps, Conjunctiva normal  Cardiovascular: S1 and S2 Present, no Murmur Respiratory: Normal respiratory effort, Bilateral Air entry present and no Crackles, occasional expiratory wheezes Abdomen: Bowel Sound present, Soft and no  tenderness Extremities: trace Pedal edema, right lower extremity open ulcer Neurology: alert and oriented to time, place, and person affect appropriate. no new focal deficit Gait not checked due to patient safety concerns      Data Reviewed: My review of labs, imaging, notes and other tests shows no new significant findings.   Disposition:  Status is: Inpatient  Remains inpatient appropriate because: Ongoing need for IV antibiotics, further work-up per hand surgery for right middle finger fracture, pain control  Family Communication: None at bedside.  DVT Prophylaxis: enoxaparin (LOVENOX) injection 40 mg Start: 10/22/21 1000   Time spent: 35 minutes.   Author: Berle Mull  10/23/2021 4:01 PM  To reach On-call, see care teams to locate the attending and reach out via [email or web address]. Between 7PM-7AM, please contact night-coverage If you still have difficulty reaching the attending provider, please page the [facility] (Director on Call) for [facility] on amion for assistance.

## 2021-10-24 DIAGNOSIS — L03115 Cellulitis of right lower limb: Secondary | ICD-10-CM | POA: Diagnosis not present

## 2021-10-24 LAB — GLUCOSE, CAPILLARY
Glucose-Capillary: 92 mg/dL (ref 70–99)
Glucose-Capillary: 156 mg/dL — ABNORMAL HIGH (ref 70–99)
Glucose-Capillary: 100 mg/dL — ABNORMAL HIGH (ref 70–99)

## 2021-10-24 MED ORDER — DOXYCYCLINE HYCLATE 100 MG PO TABS
100.0000 mg | ORAL_TABLET | Freq: Two times a day (BID) | ORAL | Status: DC
  Administered 2021-10-24 – 2021-10-26 (×5): 100 mg via ORAL
  Filled 2021-10-24 (×5): qty 1

## 2021-10-24 NOTE — Progress Notes (Signed)
[facility] Progress Note  Patient: [name]    ZOX:[id number]  DOA: 10/21/2021    Date of Service: the patient was seen and examined on 10/24/2021  Brief hospital course: [name] is a 70 y.o. male with medical history significant for chronic venous stasis, TBI with cognitive impairment, hypertension and asthma who presents for fall.  Found to have right ring finger fracture and cellulitis of the right lower extremity.  Assessment and Plan: * Cellulitis of right lower leg with open wound Present on admission.   Chronic wound ongoing for last few months. Currently on IV vancomycin.  Transition to oral doxycycline. Improving redness and warmth. Pt should resume follow-up with the outpatient wound care center after discharge. Apply Aquacel to right leg wound Q day and cover with ABD pad and kerlex, then apply ace wrap  Displaced fracture of middle phalanx of right middle finger, initial encounter for closed fracture Hand surgery consulted, recommend splint and pain control.  Hand surgery currently contemplating nonoperative therapy versus operative therapy. Awaiting final recommendation from hand surgery.  Asthma, chronic, unspecified asthma severity, with acute exacerbation Improving. Continue inhalers and DuoNeb therapy.  Chest x-ray negative for any acute abnormality.  COVID and influenza negative.  TBI (traumatic brain injury) Prior history.  No acute issues for now.  HTN (hypertension) Blood pressure relatively controlled.  Monitor.    Body mass index is 28.55 kg/m.        Subjective: No nausea no vomiting no fever no chills.  Pain still present but improving.  Objective: Vital signs were reviewed and unremarkable.  Exam: General: Appear in mild distress, no Rash; Oral Mucosa Clear, moist. no Abnormal Neck Mass Or lumps, Conjunctiva normal  Cardiovascular: S1 and S2 Present, no Murmur, Respiratory: good respiratory effort, Bilateral Air entry  present and CTA, no Crackles, no wheezes Abdomen: Bowel Sound present, Soft and no tenderness Extremities: Right lower extremity edema. Neurology: alert and oriented to time, place, and person affect appropriate. no new focal deficit Gait not checked due to patient safety concerns    Data Reviewed: My review of labs, imaging, notes and other tests shows no new significant findings.   Disposition:  Status is: Inpatient  Remains inpatient appropriate because: Awaiting hand surgery recommendation.  Family Communication: None at bedside.  DVT Prophylaxis: enoxaparin (LOVENOX) injection 40 mg Start: 10/22/21 1000   Time spent: 35 minutes.   Author: Berle Mull  10/24/2021 7:22 PM  To reach On-call, see care teams to locate the attending and reach out via [email or web address]. Between 7PM-7AM, please contact night-coverage If you still have difficulty reaching the attending provider, please page the [facility] (Director on Call) for [facility] on amion for assistance.

## 2021-10-24 NOTE — Evaluation (Signed)
Physical Therapy Evaluation Patient Details Name: [name] MRN: [id number] DOB: Nov 02, 1951 Today's Date: 10/24/2021  History of Present Illness  Pt is 70 yo maled admitted on 10/21/21 with fall, cellulitis R LE and displaced fx of middle phalanx R 3rd digit (in splint).  Pt with hx of venous stasis, TBI with cog impairment, HTN, and asthma.  Clinical Impression  Pt admitted with above diagnosis. At baseline, pt ambulates in community and independent with ADLs.  He does have hx of TBI with cognitive impairments.  Pt reports that he is completely on his own with no assistance available.  Today, pt with significantly decreased safety awareness requiring mod-max cues for safety and not pushing with R UE due to fracture. Pt required min-mod A for transfers and ambulated 20'x2 with cane.  With ambulation pt with increased resp rate and work of breathing but sats stable.  Pt required assist with ADLs.  Pt with decreased awareness of deficits - as he feels he could go home without assist. PT strongly recommends SNF at this time due to deficits, lack of safety awareness, and lack of support.  Pt currently with functional limitations due to the deficits listed below (see PT Problem List). Pt will benefit from skilled PT to increase their independence and safety with mobility to allow discharge to the venue listed below.          Recommendations for follow up therapy are one component of a multi-disciplinary discharge planning process, led by the attending physician.  Recommendations may be updated based on patient status, additional functional criteria and insurance authorization.  Follow Up Recommendations Skilled nursing-short term rehab (<3 hours/day)    Assistance Recommended at Discharge Frequent or constant Supervision/Assistance  Functional Status Assessment Patient has had a recent decline in their functional status and demonstrates the ability to make significant improvements in function in a  reasonable and predictable amount of time.  Equipment Recommendations  None recommended by PT    Recommendations for Other Services       Precautions / Restrictions Precautions Precautions: Fall Required Braces or Orthoses: Splint/Cast Splint/Cast: R hand/finger in splint Restrictions Weight Bearing Restrictions: Yes RUE Weight Bearing: Non weight bearing (Not ordered but unstable fx digit 3 - treated as NWB hand)      Mobility  Bed Mobility Overal bed mobility: Needs Assistance Bed Mobility: Supine to Sit     Supine to sit: Min assist;HOB elevated     General bed mobility comments: Max cues to not push R hand    Transfers Overall transfer level: Needs assistance   Transfers: Sit to/from Stand Sit to Stand: Mod assist           General transfer comment: Performed from bed and toielt with mod A to lift and max cues to not push with R hand    Ambulation/Gait Ambulation/Gait assistance: Min assist Gait Distance (Feet): 20 Feet (20'x2) Assistive device: None (walking stick) Gait Pattern/deviations: Wide base of support Gait velocity: decreased     General Gait Details: Wide BOS with increased lateral shifting to advance legs and very small shuffle steps; max cues for use of cane in L hand  Stairs            Wheelchair Mobility    Modified Rankin (Stroke Patients Only)       Balance Overall balance assessment: Needs assistance Sitting-balance support: No upper extremity supported Sitting balance-Leahy Scale: Good     Standing balance support: Single extremity supported Standing balance-Leahy Scale: Poor  Standing balance comment: Requiring UE support; cues for safety; and min A at times                             Pertinent Vitals/Pain Pain Assessment: 0-10 Pain Location: Back and finger and head Pain Descriptors / Indicators: Aching Pain Intervention(s): Limited activity within patient's tolerance;Monitored during session     [place] expects to be discharged to:: Private residence Living Arrangements: Alone Available Help at Discharge: Other (Comment) (no assist) Type of Home: Apartment Home Access: Level entry       Home Layout: One level Home Equipment: IT sales professional (4 wheels);Shower seat Additional Comments: walking sticks    Prior Function Prior Level of Function : Independent/Modified Independent             Mobility Comments: Does not drive; walks to store; walks with walking stick (does use rollator at store but more for carrying food home) ADLs Comments: Reports independent with ADLs and IADLs     Hand Dominance   Dominant Hand: Left    Extremity/Trunk Assessment   Upper Extremity Assessment Upper Extremity Assessment: RUE deficits/detail;Defer to OT evaluation RUE Deficits / Details: R hand/finger in splint    Lower Extremity Assessment Lower Extremity Assessment: LLE deficits/detail;RLE deficits/detail RLE Deficits / Details: ROM WFL; MMT at least 3/5 but not further tested due to cellulitis/painful; +edema, erythema, drainage LLE Deficits / Details: ROM WFL; MMT 4+/5    Cervical / Trunk Assessment Cervical / Trunk Assessment: Normal  Communication   Communication: No difficulties  Cognition Arousal/Alertness: Awake/alert Behavior During Therapy: WFL for tasks assessed/performed Overall Cognitive Status: History of cognitive impairments - at baseline                                 General Comments: Hx of TBI; decreased awareness of deficits, precautions, and safety        General Comments General comments (skin integrity, edema, etc.): With toielting ADLs requiring mod A and frequent cues for not using R hand;  Also pt with increased RR and possible wheezing with walking but sats 98% on RA    Exercises     Assessment/Plan    PT Assessment Patient needs continued PT services  PT Problem List Decreased  strength;Decreased mobility;Decreased safety awareness;Decreased range of motion;Decreased coordination;Decreased knowledge of precautions;Decreased activity tolerance;Decreased cognition;Decreased balance;Decreased knowledge of use of DME       PT Treatment Interventions DME instruction;Therapeutic activities;Gait training;Therapeutic exercise;Patient/family education;Balance training;Functional mobility training;Cognitive remediation    PT Goals (Current goals can be found in the Care Plan section)  Acute Rehab PT Goals Patient Stated Goal: return home PT Goal Formulation: With patient Time For Goal Achievement: 11/07/21 Potential to Achieve Goals: Fair    Frequency Min 3X/week   Barriers to discharge Decreased caregiver support      Co-evaluation               AM-PAC PT "6 Clicks" Mobility  Outcome Measure Help needed turning from your back to your side while in a flat bed without using bedrails?: A Little Help needed moving from lying on your back to sitting on the side of a flat bed without using bedrails?: A Lot Help needed moving to and from a bed to a chair (including a wheelchair)?: A Lot Help needed standing up from a chair using your arms (e.g., wheelchair or  bedside chair)?: A Lot Help needed to walk in hospital room?: A Lot Help needed climbing 3-5 steps with a railing? : A Lot 6 Click Score: 13    End of Session Equipment Utilized During Treatment: Gait belt Activity Tolerance: Patient tolerated treatment well Patient left: with chair alarm set;in chair;with call bell/phone within reach Nurse Communication: Mobility status PT Visit Diagnosis: Unsteadiness on feet (R26.81);Muscle weakness (generalized) (M62.81)    Time: 2683-4196 PT Time Calculation (min) (ACUTE ONLY): 32 min   Charges:   PT Evaluation $PT Eval Moderate Complexity: 1 Mod PT Treatments $Therapeutic Activity: 8-22 mins        Abran Richard, PT Acute Rehab Services Pager  [phone] [facility] 10/24/2021, 12:16 PM

## 2021-10-24 NOTE — Evaluation (Signed)
Occupational Therapy Evaluation Patient Details Name: [name] MRN: [id number] DOB: Oct 23, 1951 Today's Date: 10/24/2021   History of Present Illness Pt is 70 yo maled admitted on 10/21/21 with fall, cellulitis R LE and displaced fx of middle phalanx R 3rd digit (in splint).  Pt with hx of venous stasis, TBI with cog impairment, HTN, and asthma.   Clinical Impression   Patient is a 70 year old male who was noted to have had a functional decline impacting patients ability to participate in ADLs at [facility]. Patient reported living at home alone with patient completing functional mobility with Rolator to collect groceries.  Patient is unable to maintain NWB on RUE with consistent cues and education during session.patient was noted to have SOB but O2 saturation maintained in 90s. Patient was max A for LB dressing, Mod a for transfers with increased time with min A at times to maintain standing balance during patients compensatory gait pattern. Patient is not at level that can be safe at home alone. Patient would need 24/7 caregiver support to transition home safely at this time. Patient would continue to benefit from skilled OT services at this time while admitted and after d/c to address noted deficits in order to improve overall safety and independence in ADLs.       Recommendations for follow up therapy are one component of a multi-disciplinary discharge planning process, led by the attending physician.  Recommendations may be updated based on patient status, additional functional criteria and insurance authorization.   Follow Up Recommendations  Skilled nursing-short term rehab (<3 hours/day)    Assistance Recommended at Discharge Frequent or constant Supervision/Assistance  Functional Status Assessment  Patient has had a recent decline in their functional status and demonstrates the ability to make significant improvements in function in a reasonable and predictable amount of time.   Equipment Recommendations  None recommended by OT    Recommendations for Other Services       Precautions / Restrictions Precautions Precautions: Fall Required Braces or Orthoses: Splint/Cast Splint/Cast: R hand/finger in splint Restrictions Weight Bearing Restrictions: Yes RUE Weight Bearing: Non weight bearing      Mobility Bed Mobility Overal bed mobility: Needs Assistance Bed Mobility: Sit to Supine     Supine to sit: Min assist;HOB elevated Sit to supine: Min assist   General bed mobility comments: with max cues not to use R hand    Transfers Overall transfer level: Needs assistance   Transfers: Sit to/from Stand Sit to Stand: Mod assist           General transfer comment: patient needs mod A for transition to sit to stand with max cues not to use R hand      Balance Overall balance assessment: Needs assistance Sitting-balance support: No upper extremity supported Sitting balance-Leahy Scale: Good     Standing balance support: Single extremity supported Standing balance-Leahy Scale: Poor Standing balance comment: Requiring UE support; cues for safety; and min A at times                           ADL either performed or assessed with clinical judgement   ADL Overall ADL's : Needs assistance/impaired Eating/Feeding: Set up;Sitting   Grooming: Wash/dry face;Sitting Grooming Details (indicate cue type and reason): sitting in recliner Upper Body Bathing: Sitting;Bed level;Moderate assistance   Lower Body Bathing: Maximal assistance;Bed level;Sitting/lateral leans   Upper Body Dressing : Moderate assistance;Sitting Upper Body Dressing Details (indicate cue type  and reason): with soft cast in place. Lower Body Dressing: Bed level;Maximal assistance   Toilet Transfer: Minimal assistance;Moderate assistance Toilet Transfer Details (indicate cue type and reason): patient was mod A for sit to stand with patient resistive to following any  education on keeping RUE NWB. patient was extensivley educated on importance of not using RUE to encourage healing. patient would agree then attempt to use RUE " because this is how i do it". patient was noted to get irritated durign session with correction. Toileting- Clothing Manipulation and Hygiene: Moderate assistance;Sit to/from stand       Functional mobility during ADLs: Minimal assistance General ADL Comments: patient was noted to have a wide set base of support for walking with patient keeping BLE extended leaning side to side to advance BLE using walking stick. patient was noted to reach out to furniture in room as well despite education to use walking stick in LUE and keep RUE NWB.     Vision Patient Visual Report: No change from baseline       Perception     Praxis      Pertinent Vitals/Pain Pain Assessment: Faces Faces Pain Scale: Hurts little more Pain Location: Back and finger and head Pain Descriptors / Indicators: Aching Pain Intervention(s): Monitored during session;Limited activity within patient's tolerance     Hand Dominance Left   Extremity/Trunk Assessment Upper Extremity Assessment Upper Extremity Assessment: RUE deficits/detail (LUE ROM WFL) RUE Deficits / Details: R hand/finger in splint   Lower Extremity Assessment Lower Extremity Assessment: Defer to PT evaluation RLE Deficits / Details: ROM WFL; MMT at least 3/5 but not further tested due to cellulitis/painful; +edema, erythema, drainage LLE Deficits / Details: ROM WFL; MMT 4+/5   Cervical / Trunk Assessment Cervical / Trunk Assessment: Normal   Communication Communication Communication: No difficulties   Cognition Arousal/Alertness: Awake/alert Behavior During Therapy: WFL for tasks assessed/performed Overall Cognitive Status: History of cognitive impairments - at baseline                                 General Comments: patient has a history of TBI. patient has poor safety  awareness, insight to deficits and unable to follow precautions.     General Comments  With toielting ADLs requiring mod A and frequent cues for not using R hand;  Also pt with increased RR and possible wheezing with walking but sats 98% on RA    Exercises     Shoulder Instructions      Home Living Family/patient expects to be discharged to:: Private residence Living Arrangements: Alone Available Help at Discharge: Other (Comment) (no assistance) Type of Home: Apartment Home Access: Level entry     Home Layout: One level     Bathroom Shower/Tub: Teacher, early years/pre: Standard     Home Equipment: IT sales professional (4 wheels);Shower seat   Additional Comments: walking sticks      Prior Functioning/Environment Prior Level of Function : Independent/Modified Independent             Mobility Comments: Does not drive; walks to store; walks with walking stick (does use rollator at store but more for carrying food home) ADLs Comments: Reports independent with ADLs and IADLs. [facility] but that they had decreased him to 3x a week from 5x he used to be        OT Problem List:  Decreased activity tolerance;Impaired balance (sitting and/or standing);Decreased safety awareness;Impaired UE functional use;Decreased knowledge of precautions;Decreased knowledge of use of DME or AE;Decreased cognition;Decreased coordination;Increased edema      OT Treatment/Interventions: Self-care/ADL training;Therapeutic exercise;Neuromuscular education;DME and/or AE instruction;Therapeutic activities;Balance training;Patient/family education    OT Goals(Current goals can be found in the care plan section) Acute Rehab OT Goals Patient Stated Goal: to show people what i can do OT Goal Formulation: With patient Time For Goal Achievement: 11/07/21 Potential to Achieve Goals: Good  OT Frequency: Min 2X/week   Barriers to D/C:     patient lives at home alone       Co-evaluation              AM-PAC OT "6 Clicks" Daily Activity     Outcome Measure Help from another person eating meals?: A Little Help from another person taking care of personal grooming?: A Little Help from another person toileting, which includes using toliet, bedpan, or urinal?: A Lot Help from another person bathing (including washing, rinsing, drying)?: A Lot Help from another person to put on and taking off regular upper body clothing?: A Lot Help from another person to put on and taking off regular lower body clothing?: A Lot 6 Click Score: 14   End of Session Equipment Utilized During Treatment: Other (comment) (walking stick)  Activity Tolerance: Patient tolerated treatment well Patient left: in bed;with call bell/phone within reach;with bed alarm set  OT Visit Diagnosis: Unsteadiness on feet (R26.81);Muscle weakness (generalized) (M62.81);History of falling (Z91.81)                Time: 4076-8088 OT Time Calculation (min): 34 min Charges:  OT General Charges $OT Visit: 1 Visit OT Evaluation $OT Eval Moderate Complexity: 1 Mod OT Treatments $Self Care/Home Management : 8-22 mins  Jackelyn Poling OTR/L, MS Acute Rehabilitation Department Office# [phone] Pager# [phone]   Marcellina Millin 10/24/2021, 2:49 PM

## 2021-10-24 NOTE — Consult Note (Addendum)
WOC Nurse Consult Note: Consult requested for right leg.  Performed remotely after review of progress notes and photos in the EMR.  Pt is familiar to [facility] team from previous admissions, refer to note on 9/13.  They have been followed as an outpatient at the wound center at [facility].   Wound type: Right lower calf with cellulitis and full thickness chronic venous stasis wound; dark red with hypergranulation. Mod amt tan drainage, generalized edema and erythemia surrounding the wound. Pt should resume follow-up with the outpatient wound care center after discharge. Topical treatment orders provided for bedside nurses to perform as follows: Apply Aquacel to right leg wound Q day Kellie Simmering # [phone]) and cover with ABD pad and kerlex, then apply ace wrap in a spiral fashion, beginning just behind toes to below knees.  Please re-consult if further assistance is needed.  Thank-you,  Julien Girt MSN, [place], [place], [place], [place]

## 2021-10-25 ENCOUNTER — Inpatient Hospital Stay (HOSPITAL_COMMUNITY): Payer: Medicare (Managed Care)

## 2021-10-25 DIAGNOSIS — E440 Moderate protein-calorie malnutrition: Secondary | ICD-10-CM | POA: Diagnosis present

## 2021-10-25 DIAGNOSIS — L03115 Cellulitis of right lower limb: Secondary | ICD-10-CM | POA: Diagnosis not present

## 2021-10-25 MED ORDER — ADULT MULTIVITAMIN W/MINERALS CH
1.0000 | ORAL_TABLET | Freq: Every day | ORAL | Status: DC
  Administered 2021-10-25: 1 via ORAL
  Filled 2021-10-25: qty 1

## 2021-10-25 MED ORDER — ENSURE ENLIVE PO LIQD
237.0000 mL | Freq: Two times a day (BID) | ORAL | Status: DC
  Administered 2021-10-25 – 2021-10-26 (×2): 237 mL via ORAL

## 2021-10-25 MED ORDER — ASCORBIC ACID 500 MG PO TABS
250.0000 mg | ORAL_TABLET | Freq: Two times a day (BID) | ORAL | Status: DC
  Administered 2021-10-25 – 2021-10-26 (×3): 250 mg via ORAL
  Filled 2021-10-25 (×3): qty 1

## 2021-10-25 MED ORDER — ASCORBIC ACID 250 MG PO TABS: 250.0000 mg | ORAL_TABLET | Freq: Two times a day (BID) | ORAL | 0 refills | Status: AC

## 2021-10-25 MED ORDER — ADULT MULTIVITAMIN W/MINERALS CH: 1.0000 | ORAL_TABLET | Freq: Every day | ORAL | 0 refills | Status: AC

## 2021-10-25 MED ORDER — ENSURE ENLIVE PO LIQD
237.0000 mL | Freq: Two times a day (BID) | ORAL | 0 refills | Status: DC
Start: 2021-10-26 — End: 2023-10-19

## 2021-10-25 MED ORDER — TRAMADOL HCL 50 MG PO TABS: 50.0000 mg | ORAL_TABLET | Freq: Four times a day (QID) | ORAL | 0 refills | Status: DC | PRN

## 2021-10-25 MED ORDER — GUAIFENESIN ER 600 MG PO TB12: 600.0000 mg | ORAL_TABLET | Freq: Two times a day (BID) | ORAL | 0 refills | Status: DC

## 2021-10-25 NOTE — TOC Initial Note (Signed)
Transition of Care [facility]) - Initial/Assessment Note    Patient Details  Name: [name] MRN: [id number] Date of Birth: 1951/01/05  Transition of Care [facility] CM/SW Contact:    Trish Mage, LCSW Phone Number: 10/25/2021, 12:35 PM  Clinical Narrative:   Patient who is here from [facility] will return when medically stable.  Contacted DSS guardian [place] to confirm.  Contacted Kitty at [facility] to confirm.  Mr Record is being serviced by [facility].   TOC will continue to follow during the course of hospitalization.               Expected Discharge Plan: Skilled Nursing Facility Barriers to Discharge: No Barriers Identified   Patient Goals and CMS Choice     Choice offered to / list presented to : Patient  Expected Discharge Plan and Services Expected Discharge Plan: Bath   Discharge Planning Services: CM Consult Post Acute Care Choice: [place] Living arrangements for the past 2 months: Apartment                                      Prior Living Arrangements/Services Living arrangements for the past 2 months: Apartment Lives with:: Self Patient language and need for interpreter reviewed:: Yes        Need for Family Participation in Patient Care: No (Comment) Care giver support system in place?: Yes (comment)   Criminal Activity/Legal Involvement Pertinent to Current Situation/Hospitalization: No - Comment as needed  Activities of Daily Living Home Assistive Devices/Equipment: Environmental consultant (specify type), Wheelchair, Bedside commode/3-in-1, Shower chair with back ADL Screening (condition at time of admission) Patient's cognitive ability adequate to safely complete daily activities?: No Is the patient deaf or have difficulty hearing?: No Does the patient have difficulty seeing, even when wearing glasses/contacts?: Yes (hx bilateral cataract surgery per pt) Does the patient have difficulty concentrating, remembering,  or making decisions?: Yes Patient able to express need for assistance with ADLs?: Yes Does the patient have difficulty dressing or bathing?: No Independently performs ADLs?: No Communication: Independent Dressing (OT): Needs assistance Is this a change from baseline?: Change from baseline, expected to last >3 days Grooming: Independent Feeding: Independent Bathing: Needs assistance Is this a change from baseline?: Change from baseline, expected to last >3 days Toileting: Needs assistance Is this a change from baseline?: Change from baseline, expected to last >3days In/Out Bed: Needs assistance Is this a change from baseline?: Change from baseline, expected to last >3 days Walks in Home: Needs assistance Is this a change from baseline?: Change from baseline, expected to last >3 days Does the patient have difficulty walking or climbing stairs?: No Weakness of Legs: Right (hx fall on leg) Weakness of Arms/Hands: Right (fell and hurt right hand recently)  Permission Sought/Granted Permission sought to share information with : Guardian    Share Information with NAME: Barnie Alderman (Legal Guardian)   [phone]           Emotional Assessment         Alcohol / Substance Use: Not Applicable Psych Involvement: No (comment)  Admission diagnosis:  Back pain [M54.9] Cellulitis [L03.90] Cellulitis of right lower leg [L03.115] Patient Active Problem List   Diagnosis Date Noted   Displaced fracture of middle phalanx of right middle finger, initial encounter for closed fracture 10/22/2021   Cellulitis of right lower leg with open wound 10/22/2021  Asthma, chronic, unspecified asthma severity, with acute exacerbation 10/22/2021   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 [place] [address] Phone: [phone] Fax: [phone]     Social Determinants of Health (SDOH) Interventions    Readmission Risk Interventions No flowsheet data found.

## 2021-10-25 NOTE — Progress Notes (Signed)
   10/25/21 1500  Mobility  Activity Stood at bedside  Level of Assistance Minimal assist, patient does 75% or more  Assistive Device Front wheel walker  Mobility Response Tolerated fair  Mobility performed by Mobility specialist  $Mobility charge 1 Mobility   Pt eager to mobilize this afternoon. Initially pt wanted to ambulate in hall, even though he was experiencing some pain in his right foot. Instead, pt stood bedside for a couple minutes. He stated pain was too much and wanted to go back to bed. Left pt in bed with call bell at side. Pt requested to talk to his nurse. RN notified of session and request.  Rudell Cobb Mobility Specialist Acute Rehab Services Office: [phone]

## 2021-10-25 NOTE — Care Management Important Message (Signed)
Important Message  Patient Details IM Letter placed in Patients room. Name: [name] MRN: [id number] Date of Birth: [birth year]   Medicare Important Message Given:  Yes     Kerin Salen 10/25/2021, 11:02 AM

## 2021-10-25 NOTE — Progress Notes (Signed)
Initial Nutrition Assessment  DOCUMENTATION CODES:   Non-severe (moderate) malnutrition in context of chronic illness  INTERVENTION:   -Ensure Enlive po BID, each supplement provides 350 kcal and 20 grams of protein   -Multivitamin with minerals daily  -250 mg Vitamin C BID   NUTRITION DIAGNOSIS:   Moderate Malnutrition related to chronic illness as evidenced by energy intake < or equal to 75% for > or equal to 1 month, mild muscle depletion.  GOAL:   Patient will meet greater than or equal to 90% of their needs  MONITOR:   PO intake, Supplement acceptance, Labs, Weight trends, I & O's  REASON FOR ASSESSMENT:   Malnutrition Screening Tool    ASSESSMENT:   70 y.o. male with medical history significant for chronic venous stasis, TBI with cognitive impairment, hypertension and asthma who presents for fall.  Found to have right ring finger fracture and cellulitis of the right lower extremity  Patient reports good appetite. He ate 100% of his breakfast tray. States he typically only eats 1 meal a day, usually dinner or breakfast. Will drink some milk at night to help him sleep. Pt eats beans, veggies for dinner and for breakfast he would have eggs and sausage. Encouraged pt to try and have both meals each day to increase his kcal and protein intakes. Agreeable to Ensure supplements.   Per weight records, weight has increased since January 2022. Pt weighed 169 lbs at that time. Current weight: 185 lbs.  Medications reviewed.  Labs reviewed:  CBGs: 92-156   NUTRITION - FOCUSED PHYSICAL EXAM:  Flowsheet Row Most Recent Value  Orbital Region No depletion  Upper Arm Region No depletion  Thoracic and Lumbar Region No depletion  Buccal Region No depletion  Temple Region Mild depletion  Clavicle Bone Region Mild depletion  Clavicle and Acromion Bone Region No depletion  Scapular Bone Region No depletion  Dorsal Hand No depletion  Patellar Region No depletion  Anterior  Thigh Region No depletion  Posterior Calf Region Unable to assess  [red cellulitis to LEs]  Edema (RD Assessment) Mild  Hair Reviewed  Eyes Reviewed  Mouth Reviewed  Skin Reviewed  [red, dry flaky skin on face]       Diet Order:   Diet Order             Diet regular Room service appropriate? Yes; Fluid consistency: Thin  Diet effective now                   EDUCATION NEEDS:   Education needs have been addressed  Skin:  Skin Assessment: Skin Integrity Issues: Skin Integrity Issues:: Other (Comment) Other: BLE cellulitis  Last BM:  12/5  Height:   Ht Readings from Last 1 Encounters:  10/21/21 5' 7.5" (1.715 m)    Weight:   Wt Readings from Last 1 Encounters:  10/21/21 83.9 kg    BMI:  Body mass index is 28.55 kg/m.  Estimated Nutritional Needs:   Kcal:  2100-2300  Protein:  100-110g  Fluid:  2.1L/day  Clayton Bibles, MS, RD, LDN Inpatient Clinical Dietitian Contact information available via Amion

## 2021-10-25 NOTE — Discharge Summary (Signed)
Physician Discharge Summary   Patient name: [name]  Admit date:     10/21/2021  Discharge date: 10/26/2021  Discharge Physician: Berle Mull   PCP: Janifer Adie, MD   Recommendations at discharge:  Follow-up with PCP as recommended. Follow-up with wound care as recommended. Continue wound care as recommended. Follow-up with Dr. Grandville Silos hand surgery in 1 week.  Discharge Diagnoses Principal Problem:   Cellulitis of right lower leg with open wound Active Problems:   Displaced fracture of middle phalanx of right middle finger, initial encounter for closed fracture   Asthma, chronic, unspecified asthma severity, with acute exacerbation   TBI (traumatic brain injury)   HTN (hypertension)   Malnutrition of moderate degree  Hospital Course   [name] is a 70 y.o. male with medical history significant for chronic venous stasis, TBI with cognitive impairment, hypertension and asthma who presents for fall.  Found to have right ring finger fracture and cellulitis of the right lower extremity.   * Cellulitis of right lower leg with open wound Present on admission.   Chronic wound ongoing for last few months. Initially on IV vancomycin.  Transition to oral doxycycline. Improving redness and warmth. Pt should resume follow-up with the outpatient wound care center after discharge. Apply Aquacel to right leg wound Q day and cover with ABD pad and kerlex, then apply ace wrap  Displaced fracture of middle phalanx of right middle finger, initial encounter for closed fracture Hand surgery consulted, recommend splint and pain control.  I discussed with Dr. Grandville Silos, he is not planning for inpatient intervention and recommend outpatient follow-up.  Splint changed on 12/6.  Pain control with tramadol.  Asthma, chronic, unspecified asthma severity, with acute exacerbation Exacerbation resolved. Continue inhalers and DuoNeb therapy.  Chest x-ray negative for any acute  abnormality.  COVID and influenza negative.  TBI (traumatic brain injury) Prior history.  No acute issues for now.  Malnutrition of moderate degree Appreciate dietary consultation. Continue Ensure Enlive multivitamin and vitamin C.  HTN (hypertension) Blood pressure relatively controlled.  Monitor.      Procedures performed: Splint placement  Condition at discharge: good  Exam General: Appear in mild distress, no Rash; Oral Mucosa Clear, moist. no Abnormal Neck Mass Or lumps, Conjunctiva normal  Cardiovascular: S1 and S2 Present, no Murmur, Respiratory: good respiratory effort, Bilateral Air entry present and CTA, no Crackles, no wheezes Abdomen: Bowel Sound present, Soft and no tenderness Extremities: Bilateral trace edema, right leg open ulcer healing well, right upper extremity in splint.  Improved Neurology: alert and oriented to time, place, and person affect appropriate. no new focal deficit Gait not checked due to patient safety concerns     Disposition: Skilled nursing facility  Discharge time: greater than 30 minutes.  Follow-up Information     Janifer Adie, MD. Schedule an appointment as soon as possible for a visit in 1 week(s).   Specialty: Family Medicine Contact information: [place] [address] [place]. Schedule an appointment as soon as possible for a visit in 1 week(s).   Specialty: Wound Care Contact information: [address] [id number] [place] [phone]        Milly Jakob, MD. Schedule an appointment as soon as possible for a visit in 1 week(s).   Specialty: Orthopedic Surgery Contact information: [place] [address] [phone]  Allergies as of 10/25/2021   No Known Allergies      Medication List     STOP taking these medications    losartan 50 MG tablet Commonly known as: COZAAR        TAKE these medications    acetaminophen 500 MG tablet Commonly known as: TYLENOL Take 500 mg by mouth every 8 (eight) hours as needed for headache or mild pain.   albuterol 108 (90 Base) MCG/ACT inhaler Commonly known as: VENTOLIN HFA Inhale 1-2 puffs into the lungs every 6 (six) hours as needed for wheezing or shortness of breath.   ascorbic acid 250 MG tablet Commonly known as: VITAMIN C Take 1 tablet (250 mg total) by mouth 2 (two) times daily.   atorvastatin 20 MG tablet Commonly known as: LIPITOR Take 20 mg by mouth daily.   budesonide-formoterol 160-4.5 MCG/ACT inhaler Commonly known as: SYMBICORT Inhale 2 puffs into the lungs 2 (two) times daily.   chlorhexidine 0.12 % solution Commonly known as: PERIDEX Use as directed 15 mLs in the mouth or throat 2 (two) times daily.   doxycycline 100 MG EC tablet Commonly known as: DORYX Take 100 mg by mouth 2 (two) times daily. Started 10-19-21 for 14 days   feeding supplement Liqd Take 237 mLs by mouth 2 (two) times daily between meals. Start taking on: October 26, 2021   guaiFENesin 600 MG 12 hr tablet Commonly known as: MUCINEX Take 1 tablet (600 mg total) by mouth 2 (two) times daily.   hydrocortisone cream 1 % Apply 1 application topically See admin instructions. Apply thin layer to affected area(s) face daily as needed   ketoconazole 2 % shampoo Commonly known as: NIZORAL Apply 1 application topically 2 (two) times a week.   loperamide 2 MG tablet Commonly known as: IMODIUM A-D Take 2 mg by mouth daily as needed for diarrhea or loose stools. Do not exceed more than 8mg  or 4 tablets within 24 hours.   mirabegron ER 25 MG Tb24 tablet Commonly known as: MYRBETRIQ Take 25 mg by mouth daily.   multivitamin with minerals Tabs tablet Take 1 tablet by mouth daily. Start taking on: October 26, 2021   PreviDent 5000 Booster Plus 1.1 % Pste Generic drug: Sodium Fluoride Place 1 application onto teeth at bedtime.    tamsulosin 0.4 MG Caps capsule Commonly known as: FLOMAX Take 0.4 mg by mouth daily.   traMADol 50 MG tablet Commonly known as: ULTRAM Take 1 tablet (50 mg total) by mouth every 6 (six) hours as needed for moderate pain or severe pain.   Vitamin D (Ergocalciferol) 1.25 MG (50000 UNIT) Caps capsule Commonly known as: DRISDOL Take 50,000 Units by mouth every 30 (thirty) days.               Discharge Care Instructions  (From admission, onward)           Start     Ordered   10/25/21 0000  Discharge wound care:       Comments: Apply Aquacel to right leg wound Q day and cover with ABD pad and kerlex Q day, then apply ace wrap in a spiral fashion, beginning just behind toes to below knee   10/25/21 1621            DG Tibia/Fibula Right  Result Date: 10/21/2021 CLINICAL DATA:  Open wounds along the medial ankle over the past year EXAM: RIGHT TIBIA AND FIBULA - 2 VIEW COMPARISON:  08/01/2021 FINDINGS: Subcutaneous edema is present  throughout the calf, especially the mid calf and upper calf regions. Plantar calcaneal spur.  Mild degenerative hindfoot findings. Lucency medially in the talar dome favoring non-fragmented osteochondral lesion. No findings of active osteomyelitis in the tibia or fibula. IMPRESSION: 1. Subcutaneous edema persists in the calf. 2. No findings of osteomyelitis. 3. 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 Electronically Signed   By: Van Clines M.D.   On: 10/21/2021 20:58   DG Ankle 2 Views Right  Result Date: 10/21/2021 CLINICAL DATA:  Open wounds along the medial ankle for 1 year EXAM: RIGHT ANKLE - 2 VIEW COMPARISON:  Radiographs of 08/01/2021 FINDINGS: Subcutaneous edema is again observed overlying the medial and lateral malleoli. Off axis lateral view of the ankle causes some distortion of structures and obscures the subtalar joints and calcaneocuboid articulation. However, this is felt to be projectional in  nature. Dorsal subcutaneous edema tracking into the forefoot appears mildly increased from 08/01/2021. Plantar calcaneal spur noted. Lucency medially along the talar dome suspicious for a chronic non-fragmented osteochondral lesion. No bony destructive findings characteristic of osteomyelitis. IMPRESSION: 1. No findings of osteomyelitis on conventional radiographs. 2. Persistent subcutaneous edema along the malleoli. Mild increase in the subcutaneous edema tracking in the dorsum of the foot. 3. 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 4. Plantar calcaneal spur. Electronically Signed   By: Van Clines M.D.   On: 10/21/2021 21:00   CT Head Wo Contrast  Result Date: 10/21/2021 CLINICAL DATA:  Head laceration. EXAM: CT HEAD WITHOUT CONTRAST TECHNIQUE: Contiguous axial images were obtained from the base of the skull through the vertex without intravenous contrast. COMPARISON:  None. FINDINGS: Brain: Old right frontal infarction is noted. No mass effect or midline shift is noted. Ventricular size is within normal limits. There is no evidence of mass lesion, hemorrhage or acute infarction. Vascular: No hyperdense vessel or unexpected calcification. Skull: Normal. Negative for fracture or focal lesion. Sinuses/Orbits: No acute finding. Other: None. IMPRESSION: No acute intracranial abnormality seen. Electronically Signed   By: Marijo Conception M.D.   On: 10/21/2021 12:22   DG Thoracic Spine 1 View  Result Date: 10/22/2021 CLINICAL DATA:  Recent fall with back pain, initial encounter EXAM: THORACIC SPINE - 1 VIEW COMPARISON:  None. FINDINGS: Single frontal view of the thoracic spine shows no paraspinal mass or pedicle abnormality. Mild osteophytic changes are seen. No rib abnormality is noted. Evaluation for compression deformity cannot be performed on this single view exam. IMPRESSION: Degenerative change without acute abnormality. Electronically Signed   By: Inez Catalina  M.D.   On: 10/22/2021 02:30   DG CHEST PORT 1 VIEW  Result Date: 10/22/2021 CLINICAL DATA:  Shortness of breath. EXAM: PORTABLE CHEST 1 VIEW COMPARISON:  January 03, 2015 FINDINGS: Cardiomediastinal silhouette is normal. Mediastinal contours appear intact. There is no evidence of focal airspace consolidation, pleural effusion or pneumothorax. Osseous structures are without acute abnormality. Soft tissues are grossly normal. IMPRESSION: No active disease. Electronically Signed   By: Fidela Salisbury M.D.   On: 10/22/2021 15:08   DG Hand Complete Right  Result Date: 10/21/2021 CLINICAL DATA:  Ring finger pain after fall EXAM: RIGHT HAND - COMPLETE 3+ VIEW COMPARISON:  None. FINDINGS: Oblique fracture of the middle phalanx of the ring finger extends from the anterior midshaft into the central articular surface of the base of the phalanx. There is 0.4 cm of distraction at the fracture plane. No other fracture is identified. IMPRESSION: 1. Oblique  fracture of the shaft and base of the middle phalanx ring finger, with 4 mm distraction at the fracture plane. Electronically Signed   By: Van Clines M.D.   On: 10/21/2021 21:03   DG Finger Ring Right  Result Date: 10/25/2021 CLINICAL DATA:  Fracture middle phalanx of ring finger EXAM: RIGHT RING FINGER 2+V COMPARISON:  Previous studies including the examination of 10/21/2021 FINDINGS: Overlying plaster splint limits evaluation. As far as seen, alignment of fracture fragments in the shaft of middle phalanx of right ring finger has not changed significantly. IMPRESSION: Comminuted fracture is seen in the shaft of middle phalanx of right ring finger with no significant change in alignment. Electronically Signed   By: Elmer Picker M.D.   On: 10/25/2021 16:13   DG Finger Ring Right  Result Date: 10/23/2021 CLINICAL DATA:  Ring finger fracture. EXAM: RIGHT RING FINGER 2+V COMPARISON:  None. FINDINGS: Fine bony detail obscured by overlying plaster.  Comminuted fracture of the ring finger middle phalanx again noted although fine detail is obscured. IMPRESSION: Comminuted fracture of the ring finger middle phalanx, better visualized on pre reduction x-rays. Electronically Signed   By: Misty Stanley M.D.   On: 10/23/2021 12:15   Results for orders placed or performed during the hospital encounter of 10/21/21  Resp Panel by RT-PCR (Flu A&B, Covid) Nasopharyngeal Swab     Status: None   Collection Time: 10/21/21  8:59 PM   Specimen: Nasopharyngeal Swab; Nasopharyngeal(NP) swabs in vial transport medium  Result Value Ref Range Status   SARS Coronavirus 2 by RT PCR NEGATIVE NEGATIVE Final    Comment: (NOTE) SARS-CoV-2 target nucleic acids are NOT DETECTED.  The SARS-CoV-2 RNA is generally detectable in upper respiratory specimens during the acute phase of infection. The lowest concentration of SARS-CoV-2 viral copies this assay can detect is 138 copies/mL. A negative result does not preclude SARS-Cov-2 infection and should not be used as the sole basis for treatment or other patient management decisions. A negative result may occur with  improper specimen collection/handling, submission of specimen other than nasopharyngeal swab, presence of viral mutation(s) within the areas targeted by this assay, and inadequate number of viral copies(<138 copies/mL). A negative result must be combined with clinical observations, patient history, and epidemiological information. The expected result is Negative.  Fact Sheet for Patients:  EntrepreneurPulse.com.au  Fact Sheet for Healthcare Providers:  IncredibleEmployment.be  This test is no t yet approved or cleared by the [place] FDA and  has been authorized for detection and/or diagnosis of SARS-CoV-2 by FDA under an Emergency Use Authorization (EUA). This EUA will remain  in effect (meaning this test can be used) for the duration of the COVID-19 declaration  under Section 564(b)(1) of the Act, 21 U.S.C.section 360bbb-3(b)(1), unless the authorization is terminated  or revoked sooner.       Influenza A by PCR NEGATIVE NEGATIVE Final   Influenza B by PCR NEGATIVE NEGATIVE Final    Comment: (NOTE) The Xpert Xpress SARS-CoV-2/FLU/RSV plus assay is intended as an aid in the diagnosis of influenza from Nasopharyngeal swab specimens and should not be used as a sole basis for treatment. Nasal washings and aspirates are unacceptable for Xpert Xpress SARS-CoV-2/FLU/RSV testing.  Fact Sheet for Patients: EntrepreneurPulse.com.au  Fact Sheet for Healthcare Providers: IncredibleEmployment.be  This test is not yet approved or cleared by the [place] FDA and has been authorized for detection and/or diagnosis of SARS-CoV-2 by FDA under an Emergency Use Authorization (EUA). This EUA will remain  in effect (meaning this test can be used) for the duration of the COVID-19 declaration under Section 564(b)(1) of the Act, 21 U.S.C. section 360bbb-3(b)(1), unless the authorization is terminated or revoked.  Performed at [facility], [place] [address], [place], [place] [address]     Signed:  Berle Mull MD.  [facility] 10/25/2021, 7:58 PM

## 2021-10-25 NOTE — Assessment & Plan Note (Signed)
Appreciate dietary consultation. Continue Ensure Enlive multivitamin and vitamin C.

## 2021-10-25 NOTE — Progress Notes (Signed)
Orthopedic Tech Progress Note Patient Details:  [name] Jan 01, 1951 [id number]  Patient ID: [name], male   DOB: 1950/12/28, 70 y.o.   MRN: [id number]  [name] 10/25/2021, 11:59 AM Existing splint removed. 2 volar splints applied per Drs orders.

## 2021-10-25 NOTE — NC FL2 (Signed)
[place] LEVEL OF CARE SCREENING TOOL     IDENTIFICATION  Patient Name: [name] Birthdate: Sep 22, 1951 Sex: male Admission Date (Current Location): 10/21/2021  [facility] and [place] Number:  Herbalist and Address:  [facility],  [place] [place], [place]      Provider Number: [id number]  Attending Physician Name and Address:  Lavina Hamman, MD  Relative Name and Phone Number:  Barnie Alderman (Steward)   [phone]    Current Level of Care: Hospital Recommended Level of Care: [place] Prior Approval Number:    Date Approved/Denied:   PASRR Number: [id number]  Discharge Plan: SNF    Current Diagnoses: Patient Active Problem List   Diagnosis Date Noted   Displaced fracture of middle phalanx of right middle finger, initial encounter for closed fracture 10/22/2021   Cellulitis of right lower leg with open wound 10/22/2021   Asthma, chronic, unspecified asthma severity, with acute exacerbation 10/22/2021   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 08/03/2021   Cellulitis 08/03/2021   Right Lower Extremity Wound infection 12/01/2020   Varicose veins of right lower extremity with complications 37/85/8850   Mild persistent asthma without complication 27/74/1287   Neuropathy 11/16/2014   TBI (traumatic brain injury) 10/08/2014   Cognitive deficits 10/08/2014   HTN (hypertension) 10/08/2014    Orientation RESPIRATION BLADDER Height & Weight     Self, Place  Normal External catheter Weight: 83.9 kg Height:  5' 7.5" (171.5 cm)  BEHAVIORAL SYMPTOMS/MOOD NEUROLOGICAL BOWEL NUTRITION STATUS      Continent Diet (see d/c summary)  AMBULATORY STATUS COMMUNICATION OF NEEDS Skin   Extensive Assist Verbally Other (Comment) (venous Stasis ulcer of r LE)                       Personal Care Assistance Level of Assistance  Bathing, Feeding, Dressing Bathing Assistance: Limited  assistance Feeding assistance: Independent Dressing Assistance: Limited assistance     Functional Limitations Info  Sight, Hearing, Speech Sight Info: Adequate Hearing Info: Adequate Speech Info: Adequate    SPECIAL CARE FACTORS FREQUENCY  PT (By licensed PT), OT (By licensed OT)     PT Frequency: 5X/W OT Frequency: 5X/W            Contractures Contractures Info: Not present    Additional Factors Info  Code Status, Allergies Code Status Info: full Allergies Info: NKA           Current Medications (10/25/2021):  This is the current hospital active medication list Current Facility-Administered Medications  Medication Dose Route Frequency Provider Last Rate Last Admin   acetaminophen (TYLENOL) tablet 650 mg  650 mg Oral Q6H PRN Lavina Hamman, MD   650 mg at 10/25/21 0708   atorvastatin (LIPITOR) tablet 20 mg  20 mg Oral Daily Lavina Hamman, MD   20 mg at 10/25/21 0807   doxycycline (VIBRA-TABS) tablet 100 mg  100 mg Oral Q12H Lavina Hamman, MD   100 mg at 10/25/21 0812   enoxaparin (LOVENOX) injection 40 mg  40 mg Subcutaneous Q24H Tu, Ching T, DO   40 mg at 10/25/21 0807   feeding supplement (ENSURE ENLIVE / ENSURE PLUS) liquid 237 mL  237 mL Oral BID BM Lavina Hamman, MD       guaiFENesin (MUCINEX) 12 hr tablet 600 mg  600 mg Oral BID Lavina Hamman, MD   600 mg at 10/25/21 [phone]  ipratropium-albuterol (DUONEB) 0.5-2.5 (3) MG/3ML nebulizer solution 3 mL  3 mL Nebulization Q6H PRN Lavina Hamman, MD       mirabegron ER [facility]) tablet 25 mg  25 mg Oral Daily Lavina Hamman, MD   25 mg at 10/25/21 0811   mometasone-formoterol (DULERA) 200-5 MCG/ACT inhaler 2 puff  2 puff Inhalation BID Lavina Hamman, MD   2 puff at 10/25/21 0805   multivitamin with minerals tablet 1 tablet  1 tablet Oral Daily Lavina Hamman, MD       oxyCODONE (Oxy IR/ROXICODONE) immediate release tablet 5 mg  5 mg Oral Q6H PRN Lavina Hamman, MD   5 mg at 10/25/21 0806   tamsulosin  (FLOMAX) capsule 0.4 mg  0.4 mg Oral Daily Lavina Hamman, MD   0.4 mg at 10/25/21 1017   traMADol (ULTRAM) tablet 50 mg  50 mg Oral Q6H PRN Lavina Hamman, MD   50 mg at 10/24/21 2050     Discharge Medications: Please see discharge summary for a list of discharge medications.  Relevant Imaging Results:  Relevant Lab Results:   Additional Information [phone], [place]

## 2021-10-26 DIAGNOSIS — S62624A Displaced fracture of medial phalanx of right ring finger, initial encounter for closed fracture: Secondary | ICD-10-CM | POA: Diagnosis not present

## 2021-10-26 DIAGNOSIS — E440 Moderate protein-calorie malnutrition: Secondary | ICD-10-CM

## 2021-10-26 DIAGNOSIS — L03115 Cellulitis of right lower limb: Secondary | ICD-10-CM | POA: Diagnosis not present

## 2021-10-26 LAB — RESP PANEL BY RT-PCR (FLU A&B, COVID) ARPGX2
Influenza A by PCR: NEGATIVE
Influenza B by PCR: NEGATIVE
SARS Coronavirus 2 by RT PCR: NEGATIVE

## 2021-10-26 NOTE — TOC Transition Note (Addendum)
Transition of Care [facility]) - CM/SW Discharge Note   Patient Details  Name: [name] MRN: [id number] Date of Birth: 27-May-1951  [facility] CM/SW Contact:  Trish Mage, LCSW Phone Number: 10/26/2021, 10:22 AM   Clinical Narrative:   Patient who is stable for d/c will transfer to [facility] today.  Guardian and [facility] informed. Transportation arranged by [facility] for 11:00. It is wheelchair pick up.  Nursing, please call report to [phone]. TOC sign off.    Final next level of care: Skilled Nursing Facility Barriers to Discharge: No Barriers Identified   Patient Goals and CMS Choice     Choice offered to / list presented to : Patient  Discharge Placement                       Discharge Plan and Services   Discharge Planning Services: CM Consult Post Acute Care Choice: [place]                               Social Determinants of Health (SDOH) Interventions     Readmission Risk Interventions No flowsheet data found.

## 2021-10-26 NOTE — Progress Notes (Signed)
Called report to [place] at [place].

## 2021-10-26 NOTE — Discharge Summary (Signed)
Physician Discharge Summary   Patient name: [name]  Admit date:     10/21/2021  Discharge date: 10/26/2021  Discharge Physician: Dwyane Dee   PCP: Janifer Adie, MD   Recommendations at discharge:  Follow-up with PCP as recommended. Follow-up with wound care as recommended. Continue wound care as recommended. Follow-up with Dr. Grandville Silos hand surgery in 1 week.  Discharge Diagnoses Principal Problem:   Cellulitis of right lower leg with open wound Active Problems:   TBI (traumatic brain injury)   HTN (hypertension)   Closed displaced fracture of middle phalanx of right ring finger   Asthma, chronic, unspecified asthma severity, with acute exacerbation   Malnutrition of moderate degree   Resolved Diagnoses Resolved Problems:   * No resolved hospital problems. [name] is a 70 y.o. male with medical history significant for chronic venous stasis, TBI with cognitive impairment, hypertension and asthma who presents for fall.  Found to have right ring finger fracture and cellulitis of the right lower extremity.   * Cellulitis of right lower leg with open wound Present on admission.   Chronic wound ongoing for last few months. Initially on IV vancomycin.  Transition to oral doxycycline. Improving redness and warmth. Pt should resume follow-up with the outpatient wound care center after discharge. Apply Aquacel to right leg wound Q day and cover with ABD pad and kerlex, then apply ace wrap  Malnutrition of moderate degree Appreciate dietary consultation. Continue Ensure Enlive multivitamin and vitamin C.  Asthma, chronic, unspecified asthma severity, with acute exacerbation Exacerbation resolved. Continue inhalers and DuoNeb therapy.  Chest x-ray negative for any acute abnormality.  COVID and influenza negative.  Closed displaced fracture of middle phalanx of right ring finger Hand surgery consulted, recommend splint and pain  control.  Discussed with Dr. Grandville Silos, he is not planning for inpatient intervention and recommend outpatient follow-up.  Splint changed on 12/6.  Pain control with tramadol.  HTN (hypertension) Blood pressure relatively controlled.  Monitor.  TBI (traumatic brain injury) Prior history.  No acute issues for now.      Condition at discharge: stable  Exam Exam General: Appear in mild distress, no Rash;   Cardiovascular: S1 and S2 Present, Respiratory: good respiratory effort Abdomen: Soft and no tenderness Extremities: Bilateral trace edema, right leg open ulcer healing well, right upper extremity in splint.  Improved Neurology: alert and oriented to time, place, and person affect appropriate. no new focal deficit Gait not checked due to patient safety concerns      Disposition: Skilled nursing facility  Discharge time: greater than 30 minutes.  Contact information for follow-up providers     Janifer Adie, MD. Schedule an appointment as soon as possible for a visit in 1 week(s).   Specialty: Family Medicine Contact information: [place] [address] [place]. Schedule an appointment as soon as possible for a visit in 1 week(s).   Specialty: Wound Care Contact information: [address] [id number] ar [phone]        Milly Jakob, MD. Schedule an appointment as soon as possible for a visit in 1 week(s).   Specialty: Orthopedic Surgery Contact information: [address] [place] [address] [phone]              Contact information for after-discharge care     Destination     [facility] .   Service: Skilled Nursing Contact information: [address] [place] [address] [phone]                     Allergies as of 10/26/2021   No Known Allergies      Medication List      STOP taking these medications    losartan 50 MG tablet Commonly known as: COZAAR       TAKE these medications    acetaminophen 500 MG tablet Commonly known as: TYLENOL Take 500 mg by mouth every 8 (eight) hours as needed for headache or mild pain.   albuterol 108 (90 Base) MCG/ACT inhaler Commonly known as: VENTOLIN HFA Inhale 1-2 puffs into the lungs every 6 (six) hours as needed for wheezing or shortness of breath.   ascorbic acid 250 MG tablet Commonly known as: VITAMIN C Take 1 tablet (250 mg total) by mouth 2 (two) times daily.   atorvastatin 20 MG tablet Commonly known as: LIPITOR Take 20 mg by mouth daily.   budesonide-formoterol 160-4.5 MCG/ACT inhaler Commonly known as: SYMBICORT Inhale 2 puffs into the lungs 2 (two) times daily.   chlorhexidine 0.12 % solution Commonly known as: PERIDEX Use as directed 15 mLs in the mouth or throat 2 (two) times daily.   doxycycline 100 MG EC tablet Commonly known as: DORYX Take 100 mg by mouth 2 (two) times daily. Started 10-19-21 for 14 days   feeding supplement Liqd Take 237 mLs by mouth 2 (two) times daily between meals.   guaiFENesin 600 MG 12 hr tablet Commonly known as: MUCINEX Take 1 tablet (600 mg total) by mouth 2 (two) times daily.   hydrocortisone cream 1 % Apply 1 application topically See admin instructions. Apply thin layer to affected area(s) face daily as needed   ketoconazole 2 % shampoo Commonly known as: NIZORAL Apply 1 application topically 2 (two) times a week.   loperamide 2 MG tablet Commonly known as: IMODIUM A-D Take 2 mg by mouth daily as needed for diarrhea or loose stools. Do not exceed more than 8mg  or 4 tablets within 24 hours.   mirabegron ER 25 MG Tb24 tablet Commonly known as: MYRBETRIQ Take 25 mg by mouth daily.   multivitamin with minerals Tabs tablet Take 1 tablet by mouth daily.   PreviDent 5000 Booster Plus 1.1 % Pste Generic drug: Sodium Fluoride Place 1 application  onto teeth at bedtime.   tamsulosin 0.4 MG Caps capsule Commonly known as: FLOMAX Take 0.4 mg by mouth daily.   traMADol 50 MG tablet Commonly known as: ULTRAM Take 1 tablet (50 mg total) by mouth every 6 (six) hours as needed for moderate pain or severe pain.   Vitamin D (Ergocalciferol) 1.25 MG (50000 UNIT) Caps capsule Commonly known as: DRISDOL Take 50,000 Units by mouth every 30 (thirty) days.               Discharge Care Instructions  (From admission, onward)           Start     Ordered   10/26/21 0000  Discharge wound care:       Comments: Apply Aquacel to right leg wound Q day and cover with ABD pad and kerlex Q day, then apply ace wrap in a spiral fashion, beginning just behind toes to below knee   10/26/21 1022   10/25/21 0000  Discharge wound care:       Comments: Apply Aquacel  to right leg wound Q day and cover with ABD pad and kerlex Q day, then apply ace wrap in a spiral fashion, beginning just behind toes to below knee   10/25/21 1621            DG Tibia/Fibula Right  Result Date: 10/21/2021 CLINICAL DATA:  Open wounds along the medial ankle over the past year EXAM: RIGHT TIBIA AND FIBULA - 2 VIEW COMPARISON:  08/01/2021 FINDINGS: Subcutaneous edema is present throughout the calf, especially the mid calf and upper calf regions. Plantar calcaneal spur.  Mild degenerative hindfoot findings. Lucency medially in the talar dome favoring non-fragmented osteochondral lesion. No findings of active osteomyelitis in the tibia or fibula. IMPRESSION: 1. Subcutaneous edema persists in the calf. 2. No findings of osteomyelitis. 3. 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 Electronically Signed   By: Van Clines M.D.   On: 10/21/2021 20:58   DG Ankle 2 Views Right  Result Date: 10/21/2021 CLINICAL DATA:  Open wounds along the medial ankle for 1 year EXAM: RIGHT ANKLE - 2 VIEW COMPARISON:  Radiographs of 08/01/2021 FINDINGS:  Subcutaneous edema is again observed overlying the medial and lateral malleoli. Off axis lateral view of the ankle causes some distortion of structures and obscures the subtalar joints and calcaneocuboid articulation. However, this is felt to be projectional in nature. Dorsal subcutaneous edema tracking into the forefoot appears mildly increased from 08/01/2021. Plantar calcaneal spur noted. Lucency medially along the talar dome suspicious for a chronic non-fragmented osteochondral lesion. No bony destructive findings characteristic of osteomyelitis. IMPRESSION: 1. No findings of osteomyelitis on conventional radiographs. 2. Persistent subcutaneous edema along the malleoli. Mild increase in the subcutaneous edema tracking in the dorsum of the foot. 3. 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 4. Plantar calcaneal spur. Electronically Signed   By: Van Clines M.D.   On: 10/21/2021 21:00   CT Head Wo Contrast  Result Date: 10/21/2021 CLINICAL DATA:  Head laceration. EXAM: CT HEAD WITHOUT CONTRAST TECHNIQUE: Contiguous axial images were obtained from the base of the skull through the vertex without intravenous contrast. COMPARISON:  None. FINDINGS: Brain: Old right frontal infarction is noted. No mass effect or midline shift is noted. Ventricular size is within normal limits. There is no evidence of mass lesion, hemorrhage or acute infarction. Vascular: No hyperdense vessel or unexpected calcification. Skull: Normal. Negative for fracture or focal lesion. Sinuses/Orbits: No acute finding. Other: None. IMPRESSION: No acute intracranial abnormality seen. Electronically Signed   By: Marijo Conception M.D.   On: 10/21/2021 12:22   DG Thoracic Spine 1 View  Result Date: 10/22/2021 CLINICAL DATA:  Recent fall with back pain, initial encounter EXAM: THORACIC SPINE - 1 VIEW COMPARISON:  None. FINDINGS: Single frontal view of the thoracic spine shows no paraspinal mass or  pedicle abnormality. Mild osteophytic changes are seen. No rib abnormality is noted. Evaluation for compression deformity cannot be performed on this single view exam. IMPRESSION: Degenerative change without acute abnormality. Electronically Signed   By: Inez Catalina M.D.   On: 10/22/2021 02:30   DG CHEST PORT 1 VIEW  Result Date: 10/22/2021 CLINICAL DATA:  Shortness of breath. EXAM: PORTABLE CHEST 1 VIEW COMPARISON:  January 03, 2015 FINDINGS: Cardiomediastinal silhouette is normal. Mediastinal contours appear intact. There is no evidence of focal airspace consolidation, pleural effusion or pneumothorax. Osseous structures are without acute abnormality. Soft tissues are grossly normal. IMPRESSION: No active disease. Electronically Signed   By: Thomas Hoff  Dimitrova M.D.   On: 10/22/2021 15:08   DG Hand Complete Right  Result Date: 10/21/2021 CLINICAL DATA:  Ring finger pain after fall EXAM: RIGHT HAND - COMPLETE 3+ VIEW COMPARISON:  None. FINDINGS: Oblique fracture of the middle phalanx of the ring finger extends from the anterior midshaft into the central articular surface of the base of the phalanx. There is 0.4 cm of distraction at the fracture plane. No other fracture is identified. IMPRESSION: 1. Oblique fracture of the shaft and base of the middle phalanx ring finger, with 4 mm distraction at the fracture plane. Electronically Signed   By: Van Clines M.D.   On: 10/21/2021 21:03   DG Finger Ring Right  Result Date: 10/25/2021 CLINICAL DATA:  Fracture middle phalanx of ring finger EXAM: RIGHT RING FINGER 2+V COMPARISON:  Previous studies including the examination of 10/21/2021 FINDINGS: Overlying plaster splint limits evaluation. As far as seen, alignment of fracture fragments in the shaft of middle phalanx of right ring finger has not changed significantly. IMPRESSION: Comminuted fracture is seen in the shaft of middle phalanx of right ring finger with no significant change in alignment.  Electronically Signed   By: Elmer Picker M.D.   On: 10/25/2021 16:13   DG Finger Ring Right  Result Date: 10/23/2021 CLINICAL DATA:  Ring finger fracture. EXAM: RIGHT RING FINGER 2+V COMPARISON:  None. FINDINGS: Fine bony detail obscured by overlying plaster. Comminuted fracture of the ring finger middle phalanx again noted although fine detail is obscured. IMPRESSION: Comminuted fracture of the ring finger middle phalanx, better visualized on pre reduction x-rays. Electronically Signed   By: Misty Stanley M.D.   On: 10/23/2021 12:15   Results for orders placed or performed during the hospital encounter of 10/21/21  Resp Panel by RT-PCR (Flu A&B, Covid) Nasopharyngeal Swab     Status: None   Collection Time: 10/21/21  8:59 PM   Specimen: Nasopharyngeal Swab; Nasopharyngeal(NP) swabs in vial transport medium  Result Value Ref Range Status   SARS Coronavirus 2 by RT PCR NEGATIVE NEGATIVE Final    Comment: (NOTE) SARS-CoV-2 target nucleic acids are NOT DETECTED.  The SARS-CoV-2 RNA is generally detectable in upper respiratory specimens during the acute phase of infection. The lowest concentration of SARS-CoV-2 viral copies this assay can detect is 138 copies/mL. A negative result does not preclude SARS-Cov-2 infection and should not be used as the sole basis for treatment or other patient management decisions. A negative result may occur with  improper specimen collection/handling, submission of specimen other than nasopharyngeal swab, presence of viral mutation(s) within the areas targeted by this assay, and inadequate number of viral copies(<138 copies/mL). A negative result must be combined with clinical observations, patient history, and epidemiological information. The expected result is Negative.  Fact Sheet for Patients:  EntrepreneurPulse.com.au  Fact Sheet for Healthcare Providers:  IncredibleEmployment.be  This test is no t yet  approved or cleared by the [place] FDA and  has been authorized for detection and/or diagnosis of SARS-CoV-2 by FDA under an Emergency Use Authorization (EUA). This EUA will remain  in effect (meaning this test can be used) for the duration of the COVID-19 declaration under Section 564(b)(1) of the Act, 21 U.S.C.section 360bbb-3(b)(1), unless the authorization is terminated  or revoked sooner.       Influenza A by PCR NEGATIVE NEGATIVE Final   Influenza B by PCR NEGATIVE NEGATIVE Final    Comment: (NOTE) The Xpert Xpress SARS-CoV-2/FLU/RSV plus assay is intended as an aid  in the diagnosis of influenza from Nasopharyngeal swab specimens and should not be used as a sole basis for treatment. Nasal washings and aspirates are unacceptable for Xpert Xpress SARS-CoV-2/FLU/RSV testing.  Fact Sheet for Patients: EntrepreneurPulse.com.au  Fact Sheet for Healthcare Providers: IncredibleEmployment.be  This test is not yet approved or cleared by the [place] FDA and has been authorized for detection and/or diagnosis of SARS-CoV-2 by FDA under an Emergency Use Authorization (EUA). This EUA will remain in effect (meaning this test can be used) for the duration of the COVID-19 declaration under Section 564(b)(1) of the Act, 21 U.S.C. section 360bbb-3(b)(1), unless the authorization is terminated or revoked.  Performed at [facility], [place] [address], [place], [place] [address]     Signed:  Dwyane Dee MD.  [facility] 10/26/2021, 10:24 AM

## 2021-12-06 ENCOUNTER — Ambulatory Visit: Payer: Medicare (Managed Care) | Admitting: Physician Assistant

## 2021-12-09 ENCOUNTER — Encounter: Payer: Medicare (Managed Care) | Attending: Internal Medicine | Admitting: Internal Medicine

## 2021-12-09 ENCOUNTER — Other Ambulatory Visit: Payer: Self-pay

## 2021-12-09 DIAGNOSIS — I87331 Chronic venous hypertension (idiopathic) with ulcer and inflammation of right lower extremity: Secondary | ICD-10-CM | POA: Insufficient documentation

## 2021-12-09 DIAGNOSIS — L97818 Non-pressure chronic ulcer of other part of right lower leg with other specified severity: Secondary | ICD-10-CM | POA: Insufficient documentation

## 2021-12-09 NOTE — Progress Notes (Signed)
[name], [name] ([id number]) Visit Report for 12/09/2021 Abuse/Suicide Risk Screen Details Patient Name: [name], [name] Date of Service: 12/09/2021 10:15 AM Medical Record Number: [id number] Patient Account Number: [id number] Date of Birth/Sex: 1951-07-05 (70 y.o. M) Treating RN: Carlene Coria Primary Care Lonita Debes: Barney Drain Other Clinician: Referring Leshawn Houseworth: Barney Drain Treating Glenys Snader/Extender: Tito Dine in Treatment: 0 Abuse/Suicide Risk Screen Items Answer ABUSE RISK SCREEN: Has anyone close to you tried to hurt or harm you recentlyo No Do you feel uncomfortable with anyone in your familyo No Has anyone forced you do things that you didnot want to doo No Electronic Signature(s) Signed: 12/09/2021 4:16:16 PM By: Carlene Coria RN Entered By: Carlene Coria on 12/09/2021 10:29:21 [name] ([id number]) -------------------------------------------------------------------------------- Activities of Daily Living Details Patient Name: [name] Date of Service: 12/09/2021 10:15 AM Medical Record Number: [id number] Patient Account Number: [id number] Date of Birth/Sex: 04-Aug-1951 (70 y.o. M) Treating RN: Carlene Coria Primary Care Cammy Sanjurjo: Barney Drain Other Clinician: Referring Antwuan Eckley: Barney Drain Treating Jaden Abreu/Extender: Tito Dine in Treatment: 0 Activities of Daily Living Items Answer Activities of Daily Living (Please select one for each item) Drive Automobile Not Able Take Medications Need Assistance Use Telephone Need Assistance Care for Appearance Need Assistance Use Toilet Need Assistance Bath / Shower Need Assistance Dress Self Need Assistance Feed Self Completely Able Walk Need Assistance Get In / Out Bed Need Assistance Housework Need Assistance Prepare Meals Need Assistance Handle Money Need Assistance Shop for Self Not Able Electronic Signature(s) Signed: 12/09/2021 4:16:16 PM By: Carlene Coria  RN Entered By: Carlene Coria on 12/09/2021 10:30:00 [name] ([id number]) -------------------------------------------------------------------------------- Education Screening Details Patient Name: [name] Date of Service: 12/09/2021 10:15 AM Medical Record Number: [id number] Patient Account Number: [id number] Date of Birth/Sex: [birth year] (70 y.o. M) Treating RN: Carlene Coria Primary Care Harlowe Dowler: Barney Drain Other Clinician: Referring Edmund Rick: Barney Drain Treating Sharra Cayabyab/Extender: Tito Dine in Treatment: 0 Primary Learner Assessed: Patient Learning Preferences/Education Level/Primary Language Learning Preference: Explanation Highest Education Level: Grade School Preferred Language: English Cognitive Barrier Language Barrier: No Translator Needed: No Memory Deficit: No Emotional Barrier: No Cultural/Religious Beliefs Affecting Medical Care: No Physical Barrier Impaired Vision: No Impaired Hearing: No Decreased Hand dexterity: No Knowledge/Comprehension Knowledge Level: Medium Comprehension Level: High Ability to understand written instructions: High Ability to understand verbal instructions: High Motivation Anxiety Level: Anxious Cooperation: Cooperative Education Importance: Acknowledges Need Interest in Health Problems: Asks Questions Perception: Coherent Willingness to Engage in Self-Management High Activities: Readiness to Engage in Self-Management High Activities: Electronic Signature(s) Signed: 12/09/2021 4:16:16 PM By: Carlene Coria RN Entered By: Carlene Coria on 12/09/2021 10:30:36 [name] ([id number]) -------------------------------------------------------------------------------- Fall Risk Assessment Details Patient Name: [name] Date of Service: 12/09/2021 10:15 AM Medical Record Number: [id number] Patient Account Number: [id number] Date of Birth/Sex: 02-26-1951 (70 y.o. M) Treating RN: Carlene Coria Primary Care Coralie Stanke: Barney Drain Other Clinician: Referring Shayleen Eppinger: Barney Drain Treating Jourdin Connors/Extender: Tito Dine in Treatment: 0 Fall Risk Assessment Items Have you had 2 or more falls in the last 12 monthso 0 Yes Have you had any fall that resulted in injury in the last 12 monthso 0 Yes FALLS RISK SCREEN History of falling - immediate or within 3 months 25 Yes Secondary diagnosis (Do you have 2 or more medical diagnoseso) 0 No Ambulatory aid None/bed rest/wheelchair/nurse 0 No Crutches/cane/walker 0 No Furniture 0 No Intravenous therapy Access/Saline/Heparin Lock 0 No Gait/Transferring Normal/ bed rest/ wheelchair 0 No Weak (  short steps with or without shuffle, stooped but able to lift head while walking, may 0 No seek support from furniture) Impaired (short steps with shuffle, may have difficulty arising from chair, head down, impaired 0 No balance) Mental Status Oriented to own ability 0 No Electronic Signature(s) Signed: 12/09/2021 4:16:16 PM By: Carlene Coria RN Entered By: Carlene Coria on 12/09/2021 10:30:44 [name] ([id number]) -------------------------------------------------------------------------------- Foot Assessment Details Patient Name: [name] Date of Service: 12/09/2021 10:15 AM Medical Record Number: [id number] Patient Account Number: [id number] Date of Birth/Sex: [birth year] (70 y.o. M) Treating RN: Carlene Coria Primary Care Mckinlee Dunk: Barney Drain Other Clinician: Referring Kymorah Korf: Barney Drain Treating Krystofer Hevener/Extender: Tito Dine in Treatment: 0 Foot Assessment Items Site Locations + = Sensation present, - = Sensation absent, C = Callus, U = Ulcer R = Redness, W = Warmth, M = Maceration, PU = Pre-ulcerative lesion F = Fissure, S = Swelling, D = Dryness Assessment Right: Left: Other Deformity: No No Prior Foot Ulcer: No No Prior Amputation: No No Charcot Joint: No  No Ambulatory Status: Ambulatory With Help Assistance Device: Wheelchair Gait: Buyer, retail Signature(s) Signed: 12/09/2021 4:16:16 PM By: Carlene Coria RN Entered By: Carlene Coria on 12/09/2021 10:43:20 [name] ([id number]) -------------------------------------------------------------------------------- Nutrition Risk Screening Details Patient Name: [name] Date of Service: 12/09/2021 10:15 AM Medical Record Number: [id number] Patient Account Number: [id number] Date of Birth/Sex: 25-Mar-1951 (70 y.o. M) Treating RN: Carlene Coria Primary Care Peggy Loge: Barney Drain Other Clinician: Referring Amir Glaus: Barney Drain Treating Elvi Leventhal/Extender: Tito Dine in Treatment: 0 Height (in): 67 Weight (lbs): 180 Body Mass Index (BMI): 28.2 Nutrition Risk Screening Items Score Screening NUTRITION RISK SCREEN: I have an illness or condition that made me change the kind and/or amount of food I eat 0 No I eat fewer than two meals per day 0 No I eat few fruits and vegetables, or milk products 0 No I have three or more drinks of beer, liquor or wine almost every day 0 No I have tooth or mouth problems that make it hard for me to eat 0 No I don't always have enough money to buy the food I need 0 No I eat alone most of the time 0 No I take three or more different prescribed or over-the-counter drugs a day 1 Yes Without wanting to, I have lost or gained 10 pounds in the last six months 0 No I am not always physically able to shop, cook and/or feed myself 0 No Nutrition Protocols Good Risk Protocol 0 No interventions needed Moderate Risk Protocol High Risk Proctocol Risk Level: Good Risk Score: 1 Electronic Signature(s) Signed: 12/09/2021 4:16:16 PM By: Carlene Coria RN Entered By: Carlene Coria on 12/09/2021 10:31:03

## 2021-12-09 NOTE — Progress Notes (Signed)
[name] ([id number]) Visit Report for 12/09/2021 Debridement Details Patient Name: [name], [name] Date of Service: 12/09/2021 10:15 AM Medical Record Number: [id number] Patient Account Number: [id number] Date of Birth/Sex: 10-18-1951 (70 y.o. M) Treating RN: Carlene Coria Primary Care Provider: Barney Drain Other Clinician: Referring Provider: Barney Drain Treating Provider/Extender: Tito Dine in Treatment: 0 Debridement Performed for Wound #3 Right,Lateral Lower Leg Assessment: Performed By: Physician Ricard Dillon, MD Debridement Type: Debridement Severity of Tissue Pre Debridement: Fat layer exposed Level of Consciousness (Pre- Awake and Alert procedure): Pre-procedure Verification/Time Out Yes - 11:10 Taken: Start Time: 11:10 Total Area Debrided (L x W): 4 (cm) x 3.5 (cm) = 14 (cm) Tissue and other material Viable, Non-Viable, Subcutaneous, Skin: Dermis , Skin: Epidermis, Biofilm debrided: Level: Skin/Subcutaneous Tissue Debridement Description: Excisional Instrument: Curette Bleeding: Moderate Hemostasis Achieved: Silver Nitrate End Time: 11:18 Procedural Pain: 0 Post Procedural Pain: 0 Response to Treatment: Procedure was tolerated well Level of Consciousness (Post- Awake and Alert procedure): Post Debridement Measurements of Total Wound Length: (cm) 4 Width: (cm) 3.5 Depth: (cm) 0.2 Volume: (cm) 2.199 Character of Wound/Ulcer Post Debridement: Improved Severity of Tissue Post Debridement: Fat layer exposed Post Procedure Diagnosis Same as Pre-procedure Electronic Signature(s) Signed: 12/09/2021 3:34:05 PM By: Linton Ham MD Signed: 12/09/2021 4:16:16 PM By: Carlene Coria RN Entered By: Linton Ham on 12/09/2021 11:37:01 [name] ([id number]) -------------------------------------------------------------------------------- HPI Details Patient Name: [name] Date of Service: 12/09/2021 10:15  AM Medical Record Number: [id number] Patient Account Number: [id number] Date of Birth/Sex: Apr 13, 1951 (70 y.o. M) Treating RN: Carlene Coria Primary Care Provider: Barney Drain Other Clinician: Referring Provider: Barney Drain Treating Provider/Extender: Tito Dine in Treatment: 0 History of Present Illness HPI Description: 12/21/2020 upon evaluation today patient appears to be doing somewhat poorly in regard to his right lower extremity. He does have some erythema and is also having issues with quite a bit of drainage currently from a wound that he tells me has been present since about 2 months ago when he initially had an injury that led to this wound beginning. He does have chronic venous stasis and is secondary to this I think it has been more difficult to heal this wound than normal. With that being said the patient tells me that this tends to drain a lot down onto his feet. He is also had some issues with fungal infection he is currently on miconazole powder for that. Fortunately there is no signs of active infection systemically at this time. No fevers, chills, nausea, vomiting, or diarrhea. He does have pain but again this is more to the posterior aspect not really more lateral or anterior. There is evidence that he may have some local infection due to erythema and warmth surrounding the wound post debridement I do want to obtain a wound culture to evaluate and see if there is anything indeed of concern here. He did have arterial studies which revealed that he had a ABI of 0.91 on the right with a TBI of 0.76 in the left his ABI was 1.18 with a TBI of 0.73. He has good blood flow here. In regard to his x-ray that was taken on 11/30/2020 there was no evidence of osseous abnormality. The arterial study was actually performed on 12/03/2020. The patient was hospitalized from 11/30/2020 through 12/02/2020 during that time he was on IV antibiotics including vancomycin and  cefepime. 12/28/2020 upon evaluation today patient appears to be doing somewhat poorly in regard to his  legs currently is having a lot of drainage unfortunately which again he was not actually being wrapped with a 3 layer compression wrap after this was changed at [facility]. Subsequently I think that the patient does need to have ongoing compression therapy. Right now that means that were doing the compression wrap later he will need compression stockings. Either way though I think that he cannot just have a border foam dressing on this which was what was on today that is not sufficient to catch any of the drainage and to be honest I think this can lead to infection and greater issues here for him. The wound is pretty much about circumferential at this point a weeping area included. 01/04/2021 upon evaluation today patient unfortunately is doing quite a bit worse in regard to the overall appearance of his wound from the standpoint of infection. I did see evidence of bright green drainage consistent with Pseudomonas which I discussed with the patient today as well as his representative from social services. Nonetheless I believe that we likely need to treat him for Pseudomonas right now the clindamycin he is on is not covering for this. With that being said no sharp debridement was performed today. Of note the patient's Hydrofera Blue dressing was not the ready transfer version and unfortunately did cause this did not drain quite as well as we would like to. Nonetheless I do feel like that the patient would benefit from continuing with the wraps changed out their [facility] I think they can definitely do a good job and his swelling is under good control the big thing here is that we need to make sure that the dressing is applied appropriately we will contact them just to confirm and make sure that they know the appropriate way to apply the [facility] that they have. 01/11/2021 upon evaluation today patient  appears to be doing somewhat better in regard to the erythema on his leg at this point. Fortunately there are no signs of active infection at this time. No fevers, chills, nausea, vomiting, or diarrhea. With that being said he has been tolerating the dressing changes without complication and very pleased in that regard. I do think that he seems to be making good progress in general which is good news. I think that the biggest issue I see right now is simply the [facility] that is being used cannot transfer through to the [place] and therefore extracting a lot more fluid against the skin which is causing some irritation as well. I think that we may be able to wrap him here and get things in a better position for him to be honest. 01/18/2021 upon evaluation today patient appears to be doing better in regard to his leg. I feel like he is definitely making improvement and overall I think that this is significantly improved compared to the previous evaluation. With that being said there is still quite a ways to go to get this to heal and it is going to take quite a bit of time. Fortunately there is no evidence of active infection at this time. No fevers, chills, nausea, vomiting, or diarrhea. 01/25/2021 upon evaluation today patient appears to be doing well with regard to his leg. I feel like he is definitely showing signs of improvement which is great news. There is no evidence of active infection at this time. No fevers, chills, nausea, vomiting, or diarrhea. 02/01/2021 upon evaluation today patient appears to be doing decently well in regard to his leg on  the right as compared to previously noted. There does not appear to be any signs of active infection at this time which is great news and overall I am pleased that his systemically. Nonetheless locally still show signs of erythema he still has several days left of his antibiotic as apparently this was not started immediately after I wrote that for him  last time. Nonetheless I think that he is taking the Levaquin now and hopefully its doing job to help clear things up. Nonetheless if he is not significantly better by next week I would likely repeat the culture to try to see if anything is changed as again he still has some erythema this week that has me concerned at this point. 02/07/2021 on evaluation today patient actually appears to be doing well with regard to his wounds in his far as size and overall appearance is concerned. He still having a tremendous amount of drainage she has now completed the Levaquin and unfortunately as opposed to improving the erythema actually seems to have worsened. Has been off the Levaquin for several days therefore I did obtain a new culture today. With that being said I nonetheless feel like that the patient is showing signs of ongoing issues with cellulitis and to be honest not only is he having issues on the right leg which is what we have been monitoring here but he now is having issues on the left leg as well unfortunately. This is even up and around the knee and again I think this is of significant concern to the point that I feel like he should go to the ER for further evaluation and treatment. 07/19/2021 upon evaluation today patient appears for reevaluation here in the clinic last time I saw him was actually March 2022. At that time I sent him to the hospital due to the fact that he had a significant infection. Subsequently upon discharge from the hospital he was sent to a skilled nursing facility and I have not seen him since that time. Fortunately there does not appear to be any evidence of active infection at this time. No fevers, chills, nausea, vomiting, or diarrhea. With that being said I am somewhat concerned about the overall appearance of the wound bed. Now that it is consolidated to a much smaller area I feel like that the wound is somewhat irregular in the way it appears. For that reason I really  do think that we need to probably go ahead and see about doing a biopsy to test for the possibility of a skin cancer here. The patient is in agreement with that plan. GODSON, POLLAN ([id number]) 08/01/2021 upon evaluation today patient is actually seen for an early appointment that he was actually supposed to be here on Thursday he comes in 3 days early due to the fact that the provider at [facility] felt like he needed to be seen sooner due to the severity of the wound. It does indeed appear to be doing significantly worse compared to the last time I saw him 2 weeks ago. In fact this is probably about 3 times the size it was then as far as the skin breakdown. There is also erythema and warmth and I am concerned about that as well. I think that the patient probably needs to go to the ER for further evaluation and treatment potentially IV antibiotics is what is can to be necessary here. READMISSION 12/09/2021 This is a 71 year old man who we know fairly well in this facility. 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 He had 2 visits in follow-up in August and the last time was sent to the ER with cellulitis of the right lower extremity. I see through looking in epic he was hospitalized once in September and once in December for cellulitis of the right lower extremity and a chronic venous ulcer. The patient is a resident of the [facility]. He had Hydrofera Blue on the wound and kerlix when he came in. He has very significant erythema more distally towards the ankle and more proximally on the medial right leg. But this is not particularly tender he is not systemically unwell. The patient had ABIs TBI's in January 2022 at 0.910.76 on the right and 1.18 then 0.73 on the left respectively. Electronic Signature(s) Signed: 12/09/2021 3:34:05 PM By: Linton Ham MD Entered By: Linton Ham on 12/09/2021 11:40:31 [name]  ([id number]) -------------------------------------------------------------------------------- Physical Exam Details Patient Name: [name] Date of Service: 12/09/2021 10:15 AM Medical Record Number: [id number] Patient Account Number: [id number] Date of Birth/Sex: 04/21/1951 (70 y.o. M) Treating RN: Carlene Coria Primary Care Provider: Barney Drain Other Clinician: Referring Provider: Barney Drain Treating Provider/Extender: Tito Dine in Treatment: 0 Constitutional Sitting or standing Blood Pressure is within target range for patient.. Pulse regular and within target range for patient.Marland Kitchen Respirations regular, non- labored and within target range.. Temperature is normal and within the target range for the patient.Marland Kitchen appears in no distress. Cardiovascular Pedal pulses are palpable on the right. Notes Wound exam; on the right medial lower leg. An extensive erythema towards the ankle and proximally but this is nontender. His wound is in the middle of this completely nonviable surface dry flaking skin which was largely removed by our intake nurse. The surface of the wound required an aggressive debridement to get a nonviable surface subcutaneous tissue off hemostasis with silver nitrate and direct pressure Electronic Signature(s) Signed: 12/09/2021 3:34:05 PM By: Linton Ham MD Entered By: Linton Ham on 12/09/2021 11:42:00 [name] ([id number]) -------------------------------------------------------------------------------- Physician Orders Details Patient Name: [name] Date of Service: 12/09/2021 10:15 AM Medical Record Number: [id number] Patient Account Number: [id number] Date of Birth/Sex: 05-29-1951 (70 y.o. M) Treating RN: Carlene Coria Primary Care Provider: Barney Drain Other Clinician: Referring Provider: Barney Drain Treating Provider/Extender: Tito Dine in Treatment: 0 Verbal / Phone Orders: No Diagnosis  Coding Follow-up Appointments o Return Appointment in 1 week. Bathing/ Shower/ Hygiene o May shower with wound dressing protected with water repellent cover or cast protector. Edema Control - Lymphedema / Segmental Compressive Device / Other o Optional: One layer of unna paste to top of compression wrap (to act as an anchor). o Elevate, Exercise Daily and Avoid Standing for Long Periods of Time. o Elevate legs to the level of the heart and pump ankles as often as possible o Elevate leg(s) parallel to the floor when sitting. Wound Treatment Wound #3 - Lower Leg Wound Laterality: Right, Lateral Cleanser: Normal Saline 2 x Per Week/30 Days Discharge Instructions: Wash your hands with soap and water. Remove old dressing, discard into plastic bag and place into trash. Cleanse the wound with Normal Saline prior to applying a clean dressing using gauze sponges, not tissues or cotton balls. Do not scrub or use excessive force. Pat dry using gauze sponges, not tissue or cotton balls. Topical: Triamcinolone Acetonide Cream, 0.1%, 15 (g) tube 2 x Per Week/30 Days Discharge Instructions: apply red  areas to peri wound Primary Dressing: Hydrofera Blue Ready Transfer Foam, 4x5 (in/in) 2 x Per Week/30 Days Discharge Instructions: Apply Hydrofera Blue Ready to wound bed as directed Secondary Dressing: ABD Pad 5x9 (in/in) 2 x Per Week/30 Days Discharge Instructions: Cover with ABD pad Compression Wrap: Profore Lite LF 3 Multilayer Compression Bandaging System 2 x Per Week/30 Days Discharge Instructions: Apply 3 multi-layer wrap as prescribed. Electronic Signature(s) Signed: 12/09/2021 3:34:05 PM By: Linton Ham MD Signed: 12/09/2021 4:16:16 PM By: Carlene Coria RN Entered By: Carlene Coria on 12/09/2021 11:20:42 [name] ([id number]) -------------------------------------------------------------------------------- Problem List Details Patient Name: [name] Date of  Service: 12/09/2021 10:15 AM Medical Record Number: [id number] Patient Account Number: [id number] Date of Birth/Sex: [birth year] (70 y.o. 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 12/09/2021 No Yes right lower extremity L97.818 Non-pressure chronic ulcer of other part of right lower leg with other 12/09/2021 No Yes specified severity Inactive Problems Resolved Problems Electronic Signature(s) Signed: 12/09/2021 3:34:05 PM By: Linton Ham MD Entered By: Linton Ham on 12/09/2021 11:21:07 [name] ([id number]) -------------------------------------------------------------------------------- Progress Note Details Patient Name: [name] Date of Service: 12/09/2021 10:15 AM Medical Record Number: [id number] Patient Account Number: [id number] Date of Birth/Sex: 11/25/1950 (70 y.o. M) Treating RN: Carlene Coria Primary Care Provider: Barney Drain Other Clinician: Referring Provider: Barney Drain Treating Provider/Extender: Tito Dine in Treatment: 0 Subjective History of Present Illness (HPI) 12/21/2020 upon evaluation today patient appears to be doing somewhat poorly in regard to his right lower extremity. He does have some erythema and is also having issues with quite a bit of drainage currently from a wound that he tells me has been present since about 2 months ago when he initially had an injury that led to this wound beginning. He does have chronic venous stasis and is secondary to this I think it has been more difficult to heal this wound than normal. With that being said the patient tells me that this tends to drain a lot down onto his feet. He  is also had some issues with fungal infection he is currently on miconazole powder for that. Fortunately there is no signs of active infection systemically at this time. No fevers, chills, nausea, vomiting, or diarrhea. He does have pain but again this is more to the posterior aspect not really more lateral or anterior. There is evidence that he may have some local infection due to erythema and warmth surrounding the wound post debridement I do want to obtain a wound culture to evaluate and see if there is anything indeed of concern here. He did have arterial studies which revealed that he had a ABI of 0.91 on the right with a TBI of 0.76 in the left his ABI was 1.18 with a TBI of 0.73. He has good blood flow here. In regard to his x-ray that was taken on 11/30/2020 there was no evidence of osseous abnormality. The arterial study was actually performed on 12/03/2020. The patient was hospitalized from 11/30/2020 through 12/02/2020 during that time he was on IV antibiotics including vancomycin and cefepime. 12/28/2020 upon evaluation today patient appears to be doing somewhat poorly in regard to his legs currently is having a lot of drainage unfortunately which again he was not actually being wrapped  with a 3 layer compression wrap after this was changed at [facility]. Subsequently I think that the patient does need to have ongoing compression therapy. Right now that means that were doing the compression wrap later he will need compression stockings. Either way though I think that he cannot just have a border foam dressing on this which was what was on today that is not sufficient to catch any of the drainage and to be honest I think this can lead to infection and greater issues here for him. The wound is pretty much about circumferential at this point a weeping area included. 01/04/2021 upon evaluation today patient unfortunately is doing quite a bit worse in regard to the overall appearance of his wound from  the standpoint of infection. I did see evidence of bright green drainage consistent with Pseudomonas which I discussed with the patient today as well as his representative from social services. Nonetheless I believe that we likely need to treat him for Pseudomonas right now the clindamycin he is on is not covering for this. With that being said no sharp debridement was performed today. Of note the patient's Hydrofera Blue dressing was not the ready transfer version and unfortunately did cause this did not drain quite as well as we would like to. Nonetheless I do feel like that the patient would benefit from continuing with the wraps changed out their [facility] I think they can definitely do a good job and his swelling is under good control the big thing here is that we need to make sure that the dressing is applied appropriately we will contact them just to confirm and make sure that they know the appropriate way to apply the [facility] that they have. 01/11/2021 upon evaluation today patient appears to be doing somewhat better in regard to the erythema on his leg at this point. Fortunately there are no signs of active infection at this time. No fevers, chills, nausea, vomiting, or diarrhea. With that being said he has been tolerating the dressing changes without complication and very pleased in that regard. I do think that he seems to be making good progress in general which is good news. I think that the biggest issue I see right now is simply the [facility] that is being used cannot transfer through to the [place] and therefore extracting a lot more fluid against the skin which is causing some irritation as well. I think that we may be able to wrap him here and get things in a better position for him to be honest. 01/18/2021 upon evaluation today patient appears to be doing better in regard to his leg. I feel like he is definitely making improvement and overall I think that this is significantly  improved compared to the previous evaluation. With that being said there is still quite a ways to go to get this to heal and it is going to take quite a bit of time. Fortunately there is no evidence of active infection at this time. No fevers, chills, nausea, vomiting, or diarrhea. 01/25/2021 upon evaluation today patient appears to be doing well with regard to his leg. I feel like he is definitely showing signs of improvement which is great news. There is no evidence of active infection at this time. No fevers, chills, nausea, vomiting, or diarrhea. 02/01/2021 upon evaluation today patient appears to be doing decently well in regard to his leg on the right as compared to previously noted. There does not appear to be any signs of  active infection at this time which is great news and overall I am pleased that his systemically. Nonetheless locally still show signs of erythema he still has several days left of his antibiotic as apparently this was not started immediately after I wrote that for him last time. Nonetheless I think that he is taking the Levaquin now and hopefully its doing job to help clear things up. Nonetheless if he is not significantly better by next week I would likely repeat the culture to try to see if anything is changed as again he still has some erythema this week that has me concerned at this point. 02/07/2021 on evaluation today patient actually appears to be doing well with regard to his wounds in his far as size and overall appearance is concerned. He still having a tremendous amount of drainage she has now completed the Levaquin and unfortunately as opposed to improving the erythema actually seems to have worsened. Has been off the Levaquin for several days therefore I did obtain a new culture today. With that being said I nonetheless feel like that the patient is showing signs of ongoing issues with cellulitis and to be honest not only is he having issues on the right leg which  is what we have been monitoring here but he now is having issues on the left leg as well unfortunately. This is even up and around the knee and again I think this is of significant concern to the point that I feel like he should go to the ER for further evaluation and treatment. 07/19/2021 upon evaluation today patient appears for reevaluation here in the clinic last time I saw him was actually March 2022. At that time I sent him to the hospital due to the fact that he had a significant infection. Subsequently upon discharge from the hospital he was sent to a skilled nursing facility and I have not seen him since that time. Fortunately there does not appear to be any evidence of active infection at this time. No fevers, chills, nausea, vomiting, or diarrhea. With that being said I am somewhat concerned about the overall appearance of the wound bed. Now that it is consolidated to a much smaller area I feel like that the wound is somewhat irregular in the way it appears. For that reason I really do think that we need to probably go ahead and see about doing a biopsy to test for the possibility of a skin cancer here. The patient is in agreement with that plan. [name], [name] ([id number]) 08/01/2021 upon evaluation today patient is actually seen for an early appointment that he was actually supposed to be here on Thursday he comes in 3 days early due to the fact that the provider at [facility] felt like he needed to be seen sooner due to the severity of the wound. It does indeed appear to be doing significantly worse compared to the last time I saw him 2 weeks ago. In fact this is probably about 3 times the size it was then as far as the skin breakdown. There is also erythema and warmth and I am concerned about that as well. I think that the patient probably needs to go to the ER for further evaluation and treatment potentially IV antibiotics is what is can to be necessary here. READMISSION 12/09/2021 This is  a 71 year old man who we know fairly well in this facility. 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 He had 2 visits in follow-up in August and the last time was sent to the ER with cellulitis of the right lower extremity. I see through looking in epic he was hospitalized once in September and once in December for cellulitis of the right lower extremity and a chronic venous ulcer. The patient is a resident of the [facility]. He had Hydrofera Blue on the wound and kerlix when he came in. He has very significant erythema more distally towards the ankle and more proximally on the medial right leg. But this is not particularly tender he is not systemically unwell. The patient had ABIs TBI's in January 2022 at 0.910.76 on the right and 1.18 then 0.73 on the left respectively. Patient History Information obtained from Patient. Allergies No Known Drug Allergies Family History Diabetes - Father. Social History Never smoker, Marital Status - Widowed, Alcohol Use - Daily, Drug Use - No History, Caffeine Use - Daily. Medical History Respiratory Patient has history of Asthma Cardiovascular Patient has history of Hypertension - controlled with medication Endocrine Denies history of Type II Diabetes Integumentary (Skin) Patient has history of History of pressure wounds Neurologic Patient has history of Neuropathy Medical And Surgical History Notes Constitutional Symptoms (General Health) TBI-Tramatic Brain Injury, HTN Mental Retardation Neurologic TBI hx Psychiatric Mental retardation, Tramatic Brain Injury Objective Constitutional Sitting or standing Blood Pressure is within target range for patient.. Pulse regular and within target range for patient.Marland Kitchen Respirations regular, non- labored and within target range.. Temperature is normal and within the target range for the patient.Marland Kitchen appears in no distress. Vitals Time Taken: 10:28 AM, Height: 67  in, Source: Stated, Weight: 180 lbs, Source: Stated, BMI: 28.2, Temperature: 97.9 F, Pulse: 67 bpm, Respiratory Rate: 18 breaths/min, Blood Pressure: 117/65 mmHg. Cardiovascular Pedal pulses are palpable on the right. General Notes: Wound exam; on the right medial lower leg. An extensive erythema towards the ankle and proximally but this is nontender. His MONROE, QIN ([id number]) wound is in the middle of this completely nonviable surface dry flaking skin which was largely removed by our intake nurse. The surface of the wound required an aggressive debridement to get a nonviable surface subcutaneous tissue off hemostasis with silver nitrate and direct pressure Integumentary (Hair, Skin) Wound #3 status is Open. Original cause of wound was Gradually Appeared. The date acquired was: 10/20/2021. The wound is located on the Right,Lateral Lower Leg. The wound measures 4cm length x 3.5cm width x 0.1cm depth; 10.996cm^2 area and 1.1cm^3 volume. There is Fat Layer (Subcutaneous Tissue) exposed. There is no tunneling or undermining noted. There is a medium amount of serosanguineous drainage noted. There is small (1-33%) red granulation within the wound bed. There is a large (67-100%) amount of necrotic tissue within the wound bed including Adherent Slough. 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 There was a Excisional Skin/Subcutaneous Tissue Debridement with a total area of 14 sq cm performed by Ricard Dillon, MD. With the following instrument(s): Curette to remove Viable and Non-Viable tissue/material. Material removed includes Subcutaneous  Tissue, Skin: Dermis, Skin: Epidermis, and Biofilm. No specimens were taken. A time out was conducted at 11:10, prior to the start of the procedure. A Moderate amount  of bleeding was controlled with Silver Nitrate. The procedure was tolerated well with a pain level of 0 throughout and a pain level of 0 following the procedure. Post Debridement Measurements: 4cm length x 3.5cm width x 0.2cm depth; 2.199cm^3 volume. Character of Wound/Ulcer Post Debridement is improved. Severity of Tissue Post Debridement is: Fat layer exposed. Post procedure Diagnosis Wound #3: Same as Pre-Procedure Plan Follow-up Appointments: Return Appointment in 1 week. Bathing/ Shower/ Hygiene: May shower with wound dressing protected with water repellent cover or cast protector. Edema Control - Lymphedema / Segmental Compressive Device / Other: Optional: One layer of unna paste to top of compression wrap (to act as an anchor). Elevate, Exercise Daily and Avoid Standing for Long Periods of Time. Elevate legs to the level of the heart and pump ankles as often as possible Elevate leg(s) parallel to the floor when sitting. WOUND #3: - Lower Leg Wound Laterality: Right, Lateral Cleanser: Normal Saline 2 x Per Week/30 Days Discharge Instructions: Wash your hands with soap and water. Remove old dressing, discard into plastic bag and place into trash. Cleanse the wound with Normal Saline prior to applying a clean dressing using gauze sponges, not tissues or cotton balls. Do not scrub or use excessive force. Pat dry using gauze sponges, not tissue or cotton balls. Topical: Triamcinolone Acetonide Cream, 0.1%, 15 (g) tube 2 x Per Week/30 Days Discharge Instructions: apply red areas to peri wound Primary Dressing: Hydrofera Blue Ready Transfer Foam, 4x5 (in/in) 2 x Per Week/30 Days Discharge Instructions: Apply Hydrofera Blue Ready to wound bed as directed Secondary Dressing: ABD Pad 5x9 (in/in) 2 x Per Week/30 Days Discharge  Instructions: Cover with ABD pad Compression Wrap: Profore Lite LF 3 Multilayer Compression Bandaging System 2 x Per Week/30 Days Discharge Instructions: Apply 3 multi-layer wrap as prescribed. 1. Post debridement I continued with the [facility] they were using 2. Liberal TCA and moisturizer to the erythematous area/periwound. I think this is chronic stasis dermatitis looking back on a picture from February the appearance of the periwound was almost the same [name], [name]. ([id number]) 3. Currently I do not think he needs antibiotics. I did no cultures 4. We put ABDs and put him in 3 layer compression the [facility] changes once we will see him again in a week Electronic Signature(s) Signed: 12/09/2021 3:34:05 PM By: Linton Ham MD Entered By: Linton Ham on 12/09/2021 11:45:32 [name] ([id number]) -------------------------------------------------------------------------------- ROS/PFSH Details Patient Name: [name] Date of Service: 12/09/2021 10:15 AM Medical Record Number: [id number] Patient Account Number: [id number] Date of Birth/Sex: [birth year] (70 y.o. M) Treating RN: Carlene Coria Primary Care Provider: Barney Drain Other Clinician: Referring Provider: Barney Drain Treating Provider/Extender: Tito Dine in Treatment: 0 Information Obtained From Patient Constitutional Symptoms (General Health) Medical History: Past Medical History Notes: TBI-Tramatic Brain Injury, HTN Mental Retardation Respiratory Medical History: Positive for: Asthma Cardiovascular Medical History: Positive for: Hypertension - controlled with medication Endocrine Medical History: Negative for: Type II Diabetes Integumentary (Skin) Medical History: Positive for: History of pressure wounds Neurologic Medical History: Positive for: Neuropathy Past Medical History Notes: TBI hx Psychiatric Medical History: Past Medical History Notes: Mental  retardation, Tramatic Brain Injury Immunizations Pneumococcal Vaccine: Received Pneumococcal Vaccination: Yes Received Pneumococcal Vaccination On or After 60th Birthday: Yes Implantable Devices No devices added Family and Social History Diabetes: Yes - Father; Never smoker; Marital Status - Widowed; Alcohol Use: Daily; Drug Use: No History; Caffeine Use: Daily Electronic Signature(s) Signed: 12/09/2021 3:34:05 PM By:  Linton Ham MD Signed: 12/09/2021 4:16:16 PM By: Carlene Coria RN Domingo Cocking, Delice Lesch ([id number]) Entered By: Carlene Coria on 12/09/2021 10:29:08 [name] ([id number]) -------------------------------------------------------------------------------- SuperBill Details Patient Name: [name] Date of Service: 12/09/2021 Medical Record Number: [id number] Patient Account Number: [id number] Date of Birth/Sex: 18-Dec-1950 (70 y.o. M) Treating RN: Carlene Coria Primary Care Provider: Barney Drain Other Clinician: Referring Provider: Barney Drain Treating Provider/Extender: Tito Dine in Treatment: 0 Diagnosis Coding ICD-10 Codes Code Description [phone] Chronic venous hypertension (idiopathic) with ulcer and inflammation of right lower extremity L97.818 Non-pressure chronic ulcer of other part of right lower leg with other specified severity Facility Procedures CPT4 Code: [id number] Description: 62831 - DEB SUBQ TISSUE 20 SQ CM/< Modifier: Quantity: 1 CPT4 Code: Description: ICD-10 Diagnosis Description L97.818 Non-pressure chronic ulcer of other part of right lower leg with other spec Modifier: ified severity Quantity: Physician Procedures CPT4 Code Description: [id number] 99214 - WC PHYS LEVEL 4 - EST PT Modifier: 25 Quantity: 1 CPT4 Code Description: ICD-10 Diagnosis Description I87.331 Chronic venous hypertension (idiopathic) with ulcer and inflammation of righ L97.818 Non-pressure chronic ulcer of other part of right lower leg with  other speci Modifier: t lower extremity fied severity Quantity: CPT4 Code Description: [id number] 69485 - WC PHYS SUBQ TISS 20 SQ CM Modifier: Quantity: 1 CPT4 Code Description: ICD-10 Diagnosis Description L97.818 Non-pressure chronic ulcer of other part of right lower leg with other speci Modifier: fied severity Quantity: Electronic Signature(s) Signed: 12/09/2021 3:34:05 PM By: Linton Ham MD Entered By: Linton Ham on 12/09/2021 11:45:56

## 2021-12-09 NOTE — Progress Notes (Signed)
FABION, GATSON ([id number]) Visit Report for 12/09/2021 Allergy List Details Patient Name: [name], [name] Date of Service: 12/09/2021 10:15 AM Medical Record Number: [id number] Patient Account Number: [id number] Date of Birth/Sex: 1951-01-08 (71 y.o. M) Treating RN: Carlene Coria Primary Care Jacob Cicero: Barney Drain Other Clinician: Referring Collyn Ribas: Barney Drain Treating Simran Mannis/Extender: Ricard Dillon Weeks in Treatment: 0 Allergies Active Allergies No Known Drug Allergies Allergy Notes Electronic Signature(s) Signed: 12/09/2021 4:16:16 PM By: Carlene Coria RN Entered By: Carlene Coria on 12/09/2021 10:28:58 [name] ([id number]) -------------------------------------------------------------------------------- Arrival Information Details Patient Name: [name] Date of Service: 12/09/2021 10:15 AM Medical Record Number: [id number] Patient Account Number: [id number] Date of Birth/Sex: 1950-12-09 (71 y.o. M) Treating RN: Carlene Coria Primary Care Velma Agnes: Barney Drain Other Clinician: Referring Camdyn Laden: Barney Drain Treating Anshul Meddings/Extender: Tito Dine in Treatment: 0 Visit Information Patient Arrived: Wheel Chair Arrival Time: 10:26 Accompanied By: caregiver Transfer Assistance: None Patient Identification Verified: Yes Secondary Verification Process Completed: Yes Patient Requires Transmission-Based No Precautions: Patient Has Alerts: Yes Patient Alerts: R. ABI.91 TBI .76 12/02/20 L ABI 1.18 TBI.73 12/02/20 History Since Last Visit All ordered tests and consults were completed: No Added or deleted any medications: No Any new allergies or adverse reactions: No Had a fall or experienced change in activities of daily living that may affect risk of falls: No Signs or symptoms of abuse/neglect since last visito No Hospitalized since last visit: No Implantable device outside of the clinic excluding cellular tissue based  products placed in the center since last visit: No Has Dressing in Place as Prescribed: Yes Electronic Signature(s) Signed: 12/09/2021 4:16:16 PM By: Carlene Coria RN Entered By: Carlene Coria on 12/09/2021 10:49:04 [name] ([id number]) -------------------------------------------------------------------------------- Clinic Level of Care Assessment Details Patient Name: [name] Date of Service: 12/09/2021 10:15 AM Medical Record Number: [id number] Patient Account Number: [id number] Date of Birth/Sex: July 11, 1951 (71 y.o. M) Treating RN: Carlene Coria Primary Care Nichalos Brenton: Barney Drain Other Clinician: Referring Franklin Baumbach: Barney Drain Treating Leverne Tessler/Extender: Tito Dine in Treatment: 0 Clinic Level of Care Assessment Items TOOL 1 Quantity Score []  - Use when EandM and Procedure is performed on INITIAL visit 0 ASSESSMENTS - Nursing Assessment / Reassessment []  - General Physical Exam (combine w/ comprehensive assessment (listed just below) when performed on new 0 pt. evals) []  - 0 Comprehensive Assessment (HX, ROS, Risk Assessments, Wounds Hx, etc.) ASSESSMENTS - Wound and Skin Assessment / Reassessment []  - Dermatologic / Skin Assessment (not related to wound area) 0 ASSESSMENTS - Ostomy and/or Continence Assessment and Care []  - Incontinence Assessment and Management 0 []  - 0 Ostomy Care Assessment and Management (repouching, etc.) PROCESS - Coordination of Care []  - Simple Patient / Family Education for ongoing care 0 []  - 0 Complex (extensive) Patient / Family Education for ongoing care []  - 0 Staff obtains Programmer, systems, Records, Test Results / Process Orders []  - 0 Staff telephones HHA, Nursing Homes / Clarify orders / etc []  - 0 Routine Transfer to another Facility (non-emergent condition) []  - 0 Routine Hospital Admission (non-emergent condition) []  - 0 New Admissions / Biomedical engineer / Ordering NPWT, Apligraf, etc. []  -  0 Emergency Hospital Admission (emergent condition) PROCESS - Special Needs []  - Pediatric / Minor Patient Management 0 []  - 0 Isolation Patient Management []  - 0 Hearing / Language / Visual special needs []  - 0 Assessment of Community assistance (transportation, D/C planning, etc.) []  - 0 Additional assistance / Altered mentation []  - 0  Support Surface(s) Assessment (bed, cushion, seat, etc.) INTERVENTIONS - Miscellaneous []  - External ear exam 0 []  - 0 Patient Transfer (multiple staff / Civil Service fast streamer / Similar devices) []  - 0 Simple Staple / Suture removal (25 or less) []  - 0 Complex Staple / Suture removal (26 or more) []  - 0 Hypo/Hyperglycemic Management (do not check if billed separately) []  - 0 Ankle / Brachial Index (ABI) - do not check if billed separately Has the patient been seen at the hospital within the last three years: Yes Total Score: 0 Level Of Care: ____ [name] ([id number]) Electronic Signature(s) Signed: 12/09/2021 4:16:16 PM By: Carlene Coria RN Entered By: Carlene Coria on 12/09/2021 11:23:49 [name] ([id number]) -------------------------------------------------------------------------------- Encounter Discharge Information Details Patient Name: [name] Date of Service: 12/09/2021 10:15 AM Medical Record Number: [id number] Patient Account Number: [id number] Date of Birth/Sex: 07-Jul-1951 (71 y.o. M) Treating RN: Carlene Coria Primary Care Allissa Albright: Barney Drain Other Clinician: Referring Whitaker Holderman: Barney Drain Treating Marcel Sorter/Extender: Tito Dine in Treatment: 0 Encounter Discharge Information Items Post Procedure Vitals Discharge Condition: Stable Temperature (F): 97.9 Ambulatory Status: Wheelchair Pulse (bpm): 67 Discharge Destination: Home Respiratory Rate (breaths/min): 18 Transportation: Private Auto Blood Pressure (mmHg): 117/65 Accompanied By: caregiver Schedule Follow-up Appointment:  Yes Clinical Summary of Care: Patient Declined Electronic Signature(s) Signed: 12/09/2021 4:16:16 PM By: Carlene Coria RN Entered By: Carlene Coria on 12/09/2021 11:43:41 [name] ([id number]) -------------------------------------------------------------------------------- Lower Extremity Assessment Details Patient Name: [name] Date of Service: 12/09/2021 10:15 AM Medical Record Number: [id number] Patient Account Number: [id number] Date of Birth/Sex: 05/18/1951 (71 y.o. M) Treating RN: Carlene Coria Primary Care Vicy Medico: Barney Drain Other Clinician: Referring Jensyn Shave: Barney Drain Treating Jennylee Uehara/Extender: Tito Dine in Treatment: 0 Edema Assessment Assessed: [Left: No] [Right: No] Edema: [Left: Ye] [Right: s] Calf Left: Right: Point of Measurement: 35 cm From Medial Instep 33 cm Ankle Left: Right: Point of Measurement: 10 cm From Medial Instep 26 cm Vascular Assessment Pulses: Dorsalis Pedis Palpable: [Right:Yes] Electronic Signature(s) Signed: 12/09/2021 4:16:16 PM By: Carlene Coria RN Entered By: Carlene Coria on 12/09/2021 10:46:53 [name] ([id number]) -------------------------------------------------------------------------------- Multi Wound Chart Details Patient Name: [name] Date of Service: 12/09/2021 10:15 AM Medical Record Number: [id number] Patient Account Number: [id number] Date of Birth/Sex: 1951/01/09 (71 y.o. M) Treating RN: Carlene Coria Primary Care Jadian Karman: Barney Drain Other Clinician: Referring Keithan Dileonardo: Barney Drain Treating Maahir Horst/Extender: Tito Dine in Treatment: 0 Vital Signs Height(in): 57 Pulse(bpm): 83 Weight(lbs): 180 Blood Pressure(mmHg): 117/65 Body Mass Index(BMI): 28 Temperature(F): 97.9 Respiratory Rate(breaths/min): 18 Photos: [N/A:N/A] Wound Location: Right, Lateral Lower Leg N/A N/A Wounding Event: Gradually Appeared N/A N/A Primary Etiology: Venous  Leg Ulcer N/A N/A Comorbid History: Asthma, Hypertension, History of N/A N/A pressure wounds, Neuropathy Date Acquired: 10/20/2021 N/A N/A Weeks of Treatment: 0 N/A N/A Wound Status: Open N/A N/A Measurements L x W x D (cm) 4x3.5x0.1 N/A N/A Area (cm) : 10.996 N/A N/A Volume (cm) : 1.1 N/A N/A Classification: Full Thickness Without Exposed N/A N/A Support Structures Exudate Amount: Medium N/A N/A Exudate Type: Serosanguineous N/A N/A Exudate Color: red, brown N/A N/A Granulation Amount: Small (1-33%) N/A N/A Granulation Quality: Red N/A N/A Necrotic Amount: Large (67-100%) N/A N/A Exposed Structures: Fat Layer (Subcutaneous Tissue): N/A N/A Yes Fascia: No Tendon: No Muscle: No Joint: No Bone: No Epithelialization: None N/A N/A Debridement: Debridement - Excisional N/A N/A Pre-procedure Verification/Time 11:10 N/A N/A Out Taken: Tissue Debrided: Subcutaneous N/A N/A Level: Skin/Subcutaneous  Tissue N/A N/A Debridement Area (sq cm): 14 N/A N/A Instrument: Curette N/A N/A Bleeding: Moderate N/A N/A Hemostasis Achieved: Silver Nitrate N/A N/A Procedural Pain: 0 N/A N/A Post Procedural Pain: 0 N/A N/A Debridement Treatment Procedure was tolerated well N/A N/A Response: Post Debridement 4x3.5x0.2 N/A N/A Measurements L x W x D (cm) 2.199 N/A N/A [name], [name] ([id number]) Post Debridement Volume: (cm) Procedures Performed: Debridement N/A N/A Treatment Notes Electronic Signature(s) Signed: 12/09/2021 3:34:05 PM By: Linton Ham MD Entered By: Linton Ham on 12/09/2021 11:35:15 [name] ([id number]) -------------------------------------------------------------------------------- Multi-Disciplinary Care Plan Details Patient Name: [name] Date of Service: 12/09/2021 10:15 AM Medical Record Number: [id number] Patient Account Number: [id number] Date of Birth/Sex: Feb 13, 1951 (71 y.o. M) Treating RN: Carlene Coria Primary Care Detria Cummings: Barney Drain Other Clinician: Referring Jaina Morin: Barney Drain Treating Nirvaan Frett/Extender: Tito Dine in Treatment: 0 Active Inactive Wound/Skin Impairment Nursing Diagnoses: Knowledge deficit related to ulceration/compromised skin integrity Goals: Patient/caregiver will verbalize understanding of skin care regimen Date Initiated: 12/09/2021 Target Resolution Date: 01/09/2022 Goal Status: Active Ulcer/skin breakdown will have a volume reduction of 30% by week 4 Date Initiated: 12/09/2021 Target Resolution Date: 02/06/2022 Goal Status: Active Ulcer/skin breakdown will have a volume reduction of 50% by week 8 Date Initiated: 12/09/2021 Target Resolution Date: 03/09/2022 Goal Status: Active Ulcer/skin breakdown will have a volume reduction of 80% by week 12 Date Initiated: 12/09/2021 Target Resolution Date: 04/08/2022 Goal Status: Active Ulcer/skin breakdown will heal within 14 weeks Date Initiated: 12/09/2021 Target Resolution Date: 05/09/2022 Goal Status: Active Interventions: Assess patient/caregiver ability to obtain necessary supplies Assess patient/caregiver ability to perform ulcer/skin care regimen upon admission and as needed Assess ulceration(s) every visit Notes: Electronic Signature(s) Signed: 12/09/2021 4:16:16 PM By: Carlene Coria RN Entered By: Carlene Coria on 12/09/2021 11:17:01 [name] ([id number]) -------------------------------------------------------------------------------- Pain Assessment Details Patient Name: [name] Date of Service: 12/09/2021 10:15 AM Medical Record Number: [id number] Patient Account Number: [id number] Date of Birth/Sex: 1951-01-18 (71 y.o. M) Treating RN: Carlene Coria Primary Care Eloyse Causey: Barney Drain Other Clinician: Referring Offie Waide: Barney Drain Treating Quiana Cobaugh/Extender: Tito Dine in Treatment: 0 Active Problems Location of Pain Severity and Description of Pain Patient Has Paino  No Site Locations Pain Management and Medication Current Pain Management: Electronic Signature(s) Signed: 12/09/2021 4:16:16 PM By: Carlene Coria RN Entered By: Carlene Coria on 12/09/2021 10:27:51 [name] ([id number]) -------------------------------------------------------------------------------- Patient/Caregiver Education Details Patient Name: [name] Date of Service: 12/09/2021 10:15 AM Medical Record Number: [id number] Patient Account Number: [id number] Date of Birth/Gender: [birth year] (71 y.o. M) Treating RN: Carlene Coria Primary Care Physician: Barney Drain Other Clinician: Referring Physician: Barney Drain Treating Physician/Extender: Tito Dine in Treatment: 0 Education Assessment Education Provided To: Patient Education Topics Provided Wound/Skin Impairment: Methods: Explain/Verbal Responses: State content correctly Electronic Signature(s) Signed: 12/09/2021 4:16:16 PM By: Carlene Coria RN Entered By: Carlene Coria on 12/09/2021 11:24:01 [name] ([id number]) -------------------------------------------------------------------------------- Wound Assessment Details Patient Name: [name] Date of Service: 12/09/2021 10:15 AM Medical Record Number: [id number] Patient Account Number: [id number] Date of Birth/Sex: June 02, 1951 (71 y.o. M) Treating RN: Carlene Coria Primary Care Shandon Burlingame: Barney Drain Other Clinician: Referring Collin Hendley: Barney Drain Treating Quest Tavenner/Extender: Tito Dine in Treatment: 0 Wound Status Wound Number: 3 Primary Venous Leg Ulcer Etiology: Wound Location: Right, Lateral Lower Leg Wound Status: Open Wounding Event: Gradually Appeared Comorbid Asthma, Hypertension, History of pressure wounds, Date Acquired: 10/20/2021 History: Neuropathy Weeks Of Treatment: 0 Clustered Wound: No  Photos Wound Measurements Length: (cm) 4 Width: (cm) 3.5 Depth: (cm) 0.1 Area: (cm)  10.996 Volume: (cm) 1.1 % Reduction in Area: % Reduction in Volume: Epithelialization: None Tunneling: No Undermining: No Wound Description Classification: Full Thickness Without Exposed Support Structures Exudate Amount: Medium Exudate Type: Serosanguineous Exudate Color: red, brown Foul Odor After Cleansing: No Slough/Fibrino Yes Wound Bed Granulation Amount: Small (1-33%) Exposed Structure Granulation Quality: Red Fascia Exposed: No Necrotic Amount: Large (67-100%) Fat Layer (Subcutaneous Tissue) Exposed: Yes Necrotic Quality: Adherent Slough Tendon Exposed: No Muscle Exposed: No Joint Exposed: No Bone Exposed: No Treatment Notes Wound #3 (Lower Leg) Wound Laterality: Right, Lateral Cleanser Normal Saline Discharge Instruction: Wash your hands with soap and water. Remove old dressing, discard into plastic bag and place into trash. Cleanse the wound with Normal Saline prior to applying a clean dressing using gauze sponges, not tissues or cotton balls. Do not scrub or use excessive force. Pat dry using gauze sponges, not tissue or cotton balls. [name], [name] ([id number]) Peri-Wound Care Topical Triamcinolone Acetonide Cream, 0.1%, 15 (g) tube Discharge Instruction: apply red areas to peri wound Primary Dressing Hydrofera Blue Ready Transfer Foam, 4x5 (in/in) Discharge Instruction: Apply Hydrofera Blue Ready to wound bed as directed Secondary Dressing ABD Pad 5x9 (in/in) Discharge Instruction: Cover with ABD pad Secured With Compression Wrap Profore Lite LF 3 Multilayer Compression Wilkinson Discharge Instruction: Apply 3 multi-layer wrap as prescribed. Compression Stockings Add-Ons Electronic Signature(s) Signed: 12/09/2021 4:16:16 PM By: Carlene Coria RN Entered By: Carlene Coria on 12/09/2021 10:45:23 [name] ([id number]) -------------------------------------------------------------------------------- Vitals Details Patient Name: [name] Date of Service: 12/09/2021 10:15 AM Medical Record Number: [id number] Patient Account Number: [id number] Date of Birth/Sex: July 02, 1951 (71 y.o. M) Treating RN: Carlene Coria Primary Care Riyan Haile: Barney Drain Other Clinician: Referring Kimbely Whiteaker: Barney Drain Treating Detta Mellin/Extender: Tito Dine in Treatment: 0 Vital Signs Time Taken: 10:28 Temperature (F): 97.9 Height (in): 67 Pulse (bpm): 67 Source: Stated Respiratory Rate (breaths/min): 18 Weight (lbs): 180 Blood Pressure (mmHg): 117/65 Source: Stated Reference Range: 80 - 120 mg / dl Body Mass Index (BMI): 28.2 Electronic Signature(s) Signed: 12/09/2021 4:16:16 PM By: Carlene Coria RN Entered By: Carlene Coria on 12/09/2021 10:28:46

## 2021-12-16 ENCOUNTER — Ambulatory Visit: Payer: Medicare (Managed Care) | Admitting: Physician Assistant

## 2021-12-30 ENCOUNTER — Other Ambulatory Visit: Payer: Self-pay

## 2021-12-30 ENCOUNTER — Encounter: Payer: Medicare (Managed Care) | Attending: Physician Assistant | Admitting: Physician Assistant

## 2021-12-30 DIAGNOSIS — L97818 Non-pressure chronic ulcer of other part of right lower leg with other specified severity: Secondary | ICD-10-CM | POA: Diagnosis not present

## 2021-12-30 DIAGNOSIS — I87331 Chronic venous hypertension (idiopathic) with ulcer and inflammation of right lower extremity: Secondary | ICD-10-CM | POA: Insufficient documentation

## 2021-12-30 NOTE — Progress Notes (Addendum)
[name] ([id number]) Visit Report for 12/30/2021 Chief Complaint Document Details Patient Name: [name], [name] Date of Service: 12/30/2021 1:00 PM Medical Record Number: [id number] Patient Account Number: [id number] Date of Birth/Sex: 1951-07-14 (71 y.o. M) Treating RN: Cornell Barman Primary Care Provider: Barney Drain Other Clinician: Referring Provider: Barney Drain Treating Provider/Extender: Skipper Cliche in Treatment: 3 Information Obtained from: Patient Chief Complaint Right LE Ulcer Electronic Signature(s) Signed: 12/30/2021 1:17:29 PM By: Worthy Keeler PA-C Entered By: Worthy Keeler on 12/30/2021 13:17:29 [name] ([id number]) -------------------------------------------------------------------------------- HPI Details Patient Name: [name] Date of Service: 12/30/2021 1:00 PM Medical Record Number: [id number] Patient Account Number: [id number] Date of Birth/Sex: Nov 01, 1951 (71 y.o. M) Treating RN: Cornell Barman Primary Care Provider: Barney Drain Other Clinician: Referring Provider: Barney Drain Treating Provider/Extender: Skipper Cliche in Treatment: 3 History of Present Illness HPI Description: 12/21/2020 upon evaluation today patient appears to be doing somewhat poorly in regard to his right lower extremity. He does have some erythema and is also having issues with quite a bit of drainage currently from a wound that he tells me has been present since about 2 months ago when he initially had an injury that led to this wound beginning. He does have chronic venous stasis and is secondary to this I think it has been more difficult to heal this wound than normal. With that being said the patient tells me that this tends to drain a lot down onto his feet. He is also had some issues with fungal infection he is currently on miconazole powder for that. Fortunately there is no signs of active infection systemically at this time. No fevers,  chills, nausea, vomiting, or diarrhea. He does have pain but again this is more to the posterior aspect not really more lateral or anterior. There is evidence that he may have some local infection due to erythema and warmth surrounding the wound post debridement I do want to obtain a wound culture to evaluate and see if there is anything indeed of concern here. He did have arterial studies which revealed that he had a ABI of 0.91 on the right with a TBI of 0.76 in the left his ABI was 1.18 with a TBI of 0.73. He has good blood flow here. In regard to his x-ray that was taken on 11/30/2020 there was no evidence of osseous abnormality. The arterial study was actually performed on 12/03/2020. The patient was hospitalized from 11/30/2020 through 12/02/2020 during that time he was on IV antibiotics including vancomycin and cefepime. 12/28/2020 upon evaluation today patient appears to be doing somewhat poorly in regard to his legs currently is having a lot of drainage unfortunately which again he was not actually being wrapped with a 3 layer compression wrap after this was changed at [facility]. Subsequently I think that the patient does need to have ongoing compression therapy. Right now that means that were doing the compression wrap later he will need compression stockings. Either way though I think that he cannot just have a border foam dressing on this which was what was on today that is not sufficient to catch any of the drainage and to be honest I think this can lead to infection and greater issues here for him. The wound is pretty much about circumferential at this point a weeping area included. 01/04/2021 upon evaluation today patient unfortunately is doing quite a bit worse in regard to the overall appearance of his wound from the standpoint of infection. I  did see evidence of bright green drainage consistent with Pseudomonas which I discussed with the patient today as well as his representative from social  services. Nonetheless I believe that we likely need to treat him for Pseudomonas right now the clindamycin he is on is not covering for this. With that being said no sharp debridement was performed today. Of note the patient's Hydrofera Blue dressing was not the ready transfer version and unfortunately did cause this did not drain quite as well as we would like to. Nonetheless I do feel like that the patient would benefit from continuing with the wraps changed out their [facility] I think they can definitely do a good job and his swelling is under good control the big thing here is that we need to make sure that the dressing is applied appropriately we will contact them just to confirm and make sure that they know the appropriate way to apply the [facility] that they have. 01/11/2021 upon evaluation today patient appears to be doing somewhat better in regard to the erythema on his leg at this point. Fortunately there are no signs of active infection at this time. No fevers, chills, nausea, vomiting, or diarrhea. With that being said he has been tolerating the dressing changes without complication and very pleased in that regard. I do think that he seems to be making good progress in general which is good news. I think that the biggest issue I see right now is simply the [facility] that is being used cannot transfer through to the [place] and therefore extracting a lot more fluid against the skin which is causing some irritation as well. I think that we may be able to wrap him here and get things in a better position for him to be honest. 01/18/2021 upon evaluation today patient appears to be doing better in regard to his leg. I feel like he is definitely making improvement and overall I think that this is significantly improved compared to the previous evaluation. With that being said there is still quite a ways to go to get this to heal and it is going to take quite a bit of time. Fortunately there is  no evidence of active infection at this time. No fevers, chills, nausea, vomiting, or diarrhea. 01/25/2021 upon evaluation today patient appears to be doing well with regard to his leg. I feel like he is definitely showing signs of improvement which is great news. There is no evidence of active infection at this time. No fevers, chills, nausea, vomiting, or diarrhea. 02/01/2021 upon evaluation today patient appears to be doing decently well in regard to his leg on the right as compared to previously noted. There does not appear to be any signs of active infection at this time which is great news and overall I am pleased that his systemically. Nonetheless locally still show signs of erythema he still has several days left of his antibiotic as apparently this was not started immediately after I wrote that for him last time. Nonetheless I think that he is taking the Levaquin now and hopefully its doing job to help clear things up. Nonetheless if he is not significantly better by next week I would likely repeat the culture to try to see if anything is changed as again he still has some erythema this week that has me concerned at this point. 02/07/2021 on evaluation today patient actually appears to be doing well with regard to his wounds in his far as size and overall  appearance is concerned. He still having a tremendous amount of drainage she has now completed the Levaquin and unfortunately as opposed to improving the erythema actually seems to have worsened. Has been off the Levaquin for several days therefore I did obtain a new culture today. With that being said I nonetheless feel like that the patient is showing signs of ongoing issues with cellulitis and to be honest not only is he having issues on the right leg which is what we have been monitoring here but he now is having issues on the left leg as well unfortunately. This is even up and around the knee and again I think this is of significant concern  to the point that I feel like he should go to the ER for further evaluation and treatment. 07/19/2021 upon evaluation today patient appears for reevaluation here in the clinic last time I saw him was actually March 2022. At that time I sent him to the hospital due to the fact that he had a significant infection. Subsequently upon discharge from the hospital he was sent to a skilled nursing facility and I have not seen him since that time. Fortunately there does not appear to be any evidence of active infection at this time. No fevers, chills, nausea, vomiting, or diarrhea. With that being said I am somewhat concerned about the overall appearance of the wound bed. Now that it is consolidated to a much smaller area I feel like that the wound is somewhat irregular in the way it appears. For that reason I really do think that we need to probably go ahead and see about doing a biopsy to test for the possibility of a skin cancer here. The patient is in agreement with that plan. REINALDO, HELT ([id number]) 08/01/2021 upon evaluation today patient is actually seen for an early appointment that he was actually supposed to be here on Thursday he comes in 3 days early due to the fact that the provider at [facility] felt like he needed to be seen sooner due to the severity of the wound. It does indeed appear to be doing significantly worse compared to the last time I saw him 2 weeks ago. In fact this is probably about 3 times the size it was then as far as the skin breakdown. There is also erythema and warmth and I am concerned about that as well. I think that the patient probably needs to go to the ER for further evaluation and treatment potentially IV antibiotics is what is can to be necessary here. READMISSION 12/09/2021 This is a 71 year old man who we know fairly well in this facility. 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 He had 2 visits in follow-up in  August and the last time was sent to the ER with cellulitis of the right lower extremity. I see through looking in epic he was hospitalized once in September and once in December for cellulitis of the right lower extremity and a chronic venous ulcer. The [facility] is a resident of the [facility]. He had Hydrofera Blue on the wound and kerlix when he came in. He has very significant erythema more distally towards the ankle and more proximally on the medial right leg. But this is not particularly tender he is not systemically unwell. The patient had ABIs TBI's in January 2022 at 0.910.76 on the right and 1.18 then 0.73 on the left respectively. 12/30/2021 upon evaluation today patient appears  to be doing well with regard to his wound. He is actually measuring significantly smaller which is great news and overall I am extremely pleased with how Mr. Kothari appears today. I do not see any signs of active infection locally or systemically at this point which is great news. Electronic Signature(s) Signed: 12/30/2021 2:45:31 PM By: Worthy Keeler PA-C Entered By: Worthy Keeler on 12/30/2021 14:45:30 [name] ([id number]) -------------------------------------------------------------------------------- Physical Exam Details Patient Name: [name] Date of Service: 12/30/2021 1:00 PM Medical Record Number: [id number] Patient Account Number: [id number] Date of Birth/Sex: [birth year] (71 y.o. M) Treating RN: Cornell Barman Primary Care Provider: Barney Drain Other Clinician: Referring Provider: Barney Drain Treating Provider/Extender: Jeri Cos Weeks in Treatment: 3 Constitutional Well-nourished and well-hydrated in no acute distress. Respiratory normal breathing without difficulty. Psychiatric this patient is able to make decisions and demonstrates good insight into disease process. Alert and Oriented x 3. pleasant and cooperative. Notes Upon inspection patient's wound bed  showed evidence of good improvement. This is significantly smaller than the last time we saw him and overall since the last time I saw him this is a tremendous improvement. I am actually extremely pleased with where we are today. Electronic Signature(s) Signed: 12/30/2021 2:45:49 PM By: Worthy Keeler PA-C Entered By: Worthy Keeler on 12/30/2021 14:45:48 [name] ([id number]) -------------------------------------------------------------------------------- Physician Orders Details Patient Name: [name] Date of Service: 12/30/2021 1:00 PM Medical Record Number: [id number] Patient Account Number: [id number] Date of Birth/Sex: March 11, 1951 (71 y.o. M) Treating RN: Cornell Barman Primary Care Provider: Barney Drain Other Clinician: Referring Provider: Barney Drain Treating Provider/Extender: Skipper Cliche in Treatment: 3 Verbal / Phone Orders: No Diagnosis Coding ICD-10 Coding Code Description [phone] Chronic venous hypertension (idiopathic) with ulcer and inflammation of right lower extremity L97.818 Non-pressure chronic ulcer of other part of right lower leg with other specified severity Follow-up Appointments o Return Appointment in 1 week. Bathing/ Shower/ Hygiene o May shower with wound dressing protected with water repellent cover or cast protector. Edema Control - Lymphedema / Segmental Compressive Device / Other o Optional: One layer of unna paste to top of compression wrap (to act as an anchor). o Elevate, Exercise Daily and Avoid Standing for Long Periods of Time. o Elevate legs to the level of the heart and pump ankles as often as possible o Elevate leg(s) parallel to the floor when sitting. Wound Treatment Wound #3 - Lower Leg Wound Laterality: Right, Medial Cleanser: Normal Saline 2 x Per Week/30 Days Discharge Instructions: Wash your hands with soap and water. Remove old dressing, discard into plastic bag and place into trash. Cleanse  the wound with Normal Saline prior to applying a clean dressing using gauze sponges, not tissues or cotton balls. Do not scrub or use excessive force. Pat dry using gauze sponges, not tissue or cotton balls. Topical: Triamcinolone Acetonide Cream, 0.1%, 15 (g) tube 2 x Per Week/30 Days Discharge Instructions: apply red areas to peri wound Primary Dressing: Hydrofera Blue Ready Transfer Foam, 4x5 (in/in) 2 x Per Week/30 Days Discharge Instructions: Apply Hydrofera Blue Ready to wound bed as directed Secured With: Conforming Stretch Gauze Bandage 4x75 (in/in) 2 x Per Week/30 Days Discharge Instructions: Apply as directed Secured With: Tubigrip Size E, 3.5x10 (in/yds) 2 x Per Week/30 Days Discharge Instructions: Apply 3 Tubigrip E 3-finger-widths below knee to base of toes to secure dressing and/or for swelling. Electronic Signature(s) Signed: 12/30/2021 4:49:47 PM By: Worthy Keeler PA-C Signed: 12/30/2021 4:52:49 PM  By: Gretta Cool, BSN, RN, CWS, Kim RN, BSN Entered By: Gretta Cool, BSN, RN, CWS, Kim on 12/30/2021 14:23:43 [name] ([id number]) -------------------------------------------------------------------------------- Problem List Details Patient Name: [name] Date of Service: 12/30/2021 1:00 PM Medical Record Number: [id number] Patient Account Number: [id number] Date of Birth/Sex: Feb 19, 1951 (71 y.o. M) Treating RN: Cornell Barman Primary Care Provider: Barney Drain Other Clinician: Referring Provider: Barney Drain Treating Provider/Extender: Skipper Cliche in Treatment: 3 Active Problems ICD-10 Encounter Code Description Active Date MDM Diagnosis I87.331 Chronic venous hypertension (idiopathic) with ulcer and inflammation of 12/09/2021 No Yes right lower extremity L97.818 Non-pressure chronic ulcer of other part of right lower leg with other 12/09/2021 No Yes specified severity Inactive Problems Resolved Problems Electronic Signature(s) Signed: 12/30/2021 1:17:24 PM  By: Worthy Keeler PA-C Entered By: Worthy Keeler on 12/30/2021 13:17:24 [name] ([id number]) -------------------------------------------------------------------------------- Progress Note Details Patient Name: [name] Date of Service: 12/30/2021 1:00 PM Medical Record Number: [id number] Patient Account Number: [id number] Date of Birth/Sex: 1951/07/13 (71 y.o. M) Treating RN: Cornell Barman Primary Care Provider: Barney Drain Other Clinician: Referring Provider: Barney Drain Treating Provider/Extender: Skipper Cliche in Treatment: 3 Subjective Chief Complaint Information obtained from Patient Right LE Ulcer History of Present Illness (HPI) 12/21/2020 upon evaluation today patient appears to be doing somewhat poorly in regard to his right lower extremity. He does have some erythema and is also having issues with quite a bit of drainage currently from a wound that he tells me has been present since about 2 months ago when he initially had an injury that led to this wound beginning. He does have chronic venous stasis and is secondary to this I think it has been more difficult to heal this wound than normal. With that being said the patient tells me that this tends to drain a lot down onto his feet. He is also had some issues with fungal infection he is currently on miconazole powder for that. Fortunately there is no signs of active infection systemically at this time. No fevers, chills, nausea, vomiting, or diarrhea. He does have pain but again this is more to the posterior aspect not really more lateral or anterior. There is evidence that he may have some local infection due to erythema and warmth surrounding the wound post debridement I do want to obtain a wound culture to evaluate and see if there is anything indeed of concern here. He did have arterial studies which revealed that he had a ABI of 0.91 on the right with a TBI of 0.76 in the left his ABI was 1.18 with  a TBI of 0.73. He has good blood flow here. In regard to his x-ray that was taken on 11/30/2020 there was no evidence of osseous abnormality. The arterial study was actually performed on 12/03/2020. The patient was hospitalized from 11/30/2020 through 12/02/2020 during that time he was on IV antibiotics including vancomycin and cefepime. 12/28/2020 upon evaluation today patient appears to be doing somewhat poorly in regard to his legs currently is having a lot of drainage unfortunately which again he was not actually being wrapped with a 3 layer compression wrap after this was changed at [facility]. Subsequently I think that the patient does need to have ongoing compression therapy. Right now that means that were doing the compression wrap later he will need compression stockings. Either way though I think that he cannot just have a border foam dressing on this which was what was on today that is not sufficient  to catch any of the drainage and to be honest I think this can lead to infection and greater issues here for him. The wound is pretty much about circumferential at this point a weeping area included. 01/04/2021 upon evaluation today patient unfortunately is doing quite a bit worse in regard to the overall appearance of his wound from the standpoint of infection. I did see evidence of bright green drainage consistent with Pseudomonas which I discussed with the patient today as well as his representative from social services. Nonetheless I believe that we likely need to treat him for Pseudomonas right now the clindamycin he is on is not covering for this. With that being said no sharp debridement was performed today. Of note the patient's Hydrofera Blue dressing was not the ready transfer version and unfortunately did cause this did not drain quite as well as we would like to. Nonetheless I do feel like that the patient would benefit from continuing with the wraps changed out their [facility] I think they can  definitely do a good job and his swelling is under good control the big thing here is that we need to make sure that the dressing is applied appropriately we will contact them just to confirm and make sure that they know the appropriate way to apply the [facility] that they have. 01/11/2021 upon evaluation today patient appears to be doing somewhat better in regard to the erythema on his leg at this point. Fortunately there are no signs of active infection at this time. No fevers, chills, nausea, vomiting, or diarrhea. With that being said he has been tolerating the dressing changes without complication and very pleased in that regard. I do think that he seems to be making good progress in general which is good news. I think that the biggest issue I see right now is simply the [facility] that is being used cannot transfer through to the [place] and therefore extracting a lot more fluid against the skin which is causing some irritation as well. I think that we may be able to wrap him here and get things in a better position for him to be honest. 01/18/2021 upon evaluation today patient appears to be doing better in regard to his leg. I feel like he is definitely making improvement and overall I think that this is significantly improved compared to the previous evaluation. With that being said there is still quite a ways to go to get this to heal and it is going to take quite a bit of time. Fortunately there is no evidence of active infection at this time. No fevers, chills, nausea, vomiting, or diarrhea. 01/25/2021 upon evaluation today patient appears to be doing well with regard to his leg. I feel like he is definitely showing signs of improvement which is great news. There is no evidence of active infection at this time. No fevers, chills, nausea, vomiting, or diarrhea. 02/01/2021 upon evaluation today patient appears to be doing decently well in regard to his leg on the right as compared to  previously noted. There does not appear to be any signs of active infection at this time which is great news and overall I am pleased that his systemically. Nonetheless locally still show signs of erythema he still has several days left of his antibiotic as apparently this was not started immediately after I wrote that for him last time. Nonetheless I think that he is taking the Levaquin now and hopefully its doing job to help clear things  up. Nonetheless if he is not significantly better by next week I would likely repeat the culture to try to see if anything is changed as again he still has some erythema this week that has me concerned at this point. 02/07/2021 on evaluation today patient actually appears to be doing well with regard to his wounds in his far as size and overall appearance is concerned. He still having a tremendous amount of drainage she has now completed the Levaquin and unfortunately as opposed to improving the erythema actually seems to have worsened. Has been off the Levaquin for several days therefore I did obtain a new culture today. With that being said I nonetheless feel like that the patient is showing signs of ongoing issues with cellulitis and to be honest not only is he having issues on the right leg which is what we have been monitoring here but he now is having issues on the left leg as well unfortunately. This is even up and around the knee and again I think this is of significant concern to the point that I feel like he should go to the ER for further evaluation and treatment. 07/19/2021 upon evaluation today patient appears for reevaluation here in the clinic last time I saw him was actually March 2022. At that time I sent him to the hospital due to the fact that he had a significant infection. Subsequently upon discharge from the hospital he was sent to a skilled [facility] ([id number][facility] nursing facility and I have not seen him since that time. Fortunately there  does not appear to be any evidence of active infection at this time. No fevers, chills, nausea, vomiting, or diarrhea. With that being said I am somewhat concerned about the overall appearance of the wound bed. Now that it is consolidated to a much smaller area I feel like that the wound is somewhat irregular in the way it appears. For that reason I really do think that we need to probably go ahead and see about doing a biopsy to test for the possibility of a skin cancer here. The patient is in agreement with that plan. 08/01/2021 upon evaluation today patient is actually seen for an early appointment that he was actually supposed to be here on Thursday he comes in 3 days early due to the fact that the provider at [facility] felt like he needed to be seen sooner due to the severity of the wound. It does indeed appear to be doing significantly worse compared to the last time I saw him 2 weeks ago. In fact this is probably about 3 times the size it was then as far as the skin breakdown. There is also erythema and warmth and I am concerned about that as well. I think that the patient probably needs to go to the ER for further evaluation and treatment potentially IV antibiotics is what is can to be necessary here. READMISSION 12/09/2021 This is a 71 year old man who we know fairly well in this facility. 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 He had 2 visits in follow-up in August and the last time was sent to the ER with cellulitis of the right lower extremity. I see through looking in epic he was hospitalized once in September and once in December for cellulitis of the right lower extremity and a chronic venous ulcer. The [facility]. He had Hydrofera Blue on the  wound and kerlix when he came in. He has very significant erythema more distally towards the ankle and more proximally on the medial right leg. But this is not  particularly tender he is not systemically unwell. The patient had ABIs TBI's in January 2022 at 0.910.76 on the right and 1.18 then 0.73 on the left respectively. 12/30/2021 upon evaluation today patient appears to be doing well with regard to his wound. He is actually measuring significantly smaller which is great news and overall I am extremely pleased with how Mr. Cartlidge appears today. I do not see any signs of active infection locally or systemically at this point which is great news. Objective Constitutional Well-nourished and well-hydrated in no acute distress. Vitals Time Taken: 1:17 PM, Height: 67 in, Source: Stated, Weight: 180 lbs, Source: Stated, BMI: 28.2, Temperature: 98.3 F, Pulse: 73 bpm, Respiratory Rate: 18 breaths/min, Blood Pressure: 113/75 mmHg. Respiratory normal breathing without difficulty. Psychiatric this patient is able to make decisions and demonstrates good insight into disease process. Alert and Oriented x 3. pleasant and cooperative. General Notes: Upon inspection patient's wound bed showed evidence of good improvement. This is significantly smaller than the last time we saw him and overall since the last time I saw him this is a tremendous improvement. I am actually extremely pleased with where we are today. Integumentary (Hair, Skin) Wound #3 status is Open. Original cause of wound was Gradually Appeared. The date acquired was: 10/20/2021. The wound has been in treatment 3 weeks. The wound is located on the Right,Medial Lower Leg. The wound measures 2.5cm length x 1.7cm width x 0.1cm depth; 3.338cm^2 area and 0.334cm^3 volume. There is Fat Layer (Subcutaneous Tissue) exposed. There is no tunneling or undermining noted. There is a medium amount of serosanguineous drainage noted. The wound margin is flat and intact. There is small (1-33%) pink, pale, hyper - granulation within the wound bed. There is a large (67-100%) amount of necrotic tissue within the wound bed  including Adherent Slough. 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 Bathing/ Shower/ Hygiene: May shower with wound dressing protected with water repellent cover or cast protector. Edema Control - Lymphedema / Segmental Compressive Device / Other: Optional: One layer of unna paste to top of compression wrap (to act as an anchor). Elevate, Exercise Daily and Avoid Standing for Long Periods of Time. Elevate legs to the level of the heart and pump ankles as often as possible Elevate leg(s) parallel to the floor when sitting. WOUND #3: - Lower Leg Wound Laterality: Right, Medial Cleanser: Normal Saline 2 x Per Week/30 Days Discharge Instructions: Wash your hands with soap and water. Remove old dressing, discard into plastic bag and place into trash. Cleanse the wound with Normal Saline prior to applying a clean dressing using gauze sponges, not tissues or cotton balls. Do not scrub or use excessive force. Pat dry using gauze sponges, not tissue or cotton balls. Topical: Triamcinolone Acetonide Cream, 0.1%, 15 (g) tube 2 x Per Week/30 Days Discharge Instructions: apply red areas to peri wound Primary Dressing: Hydrofera Blue Ready Transfer Foam, 4x5 (in/in) 2 x Per Week/30 Days Discharge Instructions: Apply Hydrofera Blue Ready to wound bed as directed Secured With: Conforming Stretch Gauze Bandage 4x75 (in/in) 2 x Per Week/30 Days Discharge Instructions: Apply as directed Secured With:  Tubigrip Size E, 3.5x10 (in/yds) 2 x Per Week/30 Days Discharge Instructions: Apply 3 Tubigrip E 3-finger-widths below knee to base of toes to secure dressing and/or for swelling. 1. I would recommend currently that we going continue with the  [facility] which I think is doing an awesome job. 2. Also can recommend that we have the patient continue with the ABD pad and roll gauze to secure in place. 3. I would also suggest that we continue with Tubigrip which I think will be easier for the facility to utilize. Right now I think that using the compression wrap is probably not the best thing as he cannot necessarily be here every week and to that and they are not can be able to do an appropriate 3 layer compression wrap at the facility. We will see patient back for reevaluation in 1 week here in the clinic. If anything worsens or changes patient will contact our office for additional recommendations. Electronic Signature(s) Signed: 12/30/2021 2:46:29 PM By: Worthy Keeler PA-C Entered By: Worthy Keeler on 12/30/2021 14:46:28 [name] ([id number]) -------------------------------------------------------------------------------- SuperBill Details Patient Name: [name] Date of Service: 12/30/2021 Medical Record Number: [id number] Patient Account Number: [id number] Date of Birth/Sex: 1951-07-28 (71 y.o. M) Treating RN: Cornell Barman Primary Care Provider: Barney Drain Other Clinician: Referring Provider: Barney Drain Treating Provider/Extender: Skipper Cliche in Treatment: 3 Diagnosis Coding ICD-10 Codes Code Description [phone] Chronic venous hypertension (idiopathic) with ulcer and inflammation of right lower extremity L97.818 Non-pressure chronic ulcer of other part of right lower leg with other specified severity Facility Procedures CPT4 Code: [id number] Description: 99213 - WOUND CARE VISIT-LEV 3 EST PT Modifier: Quantity: 1 Physician Procedures CPT4 Code Description: [id number] 99213 - WC PHYS LEVEL 3 - EST PT Modifier: Quantity: 1 CPT4 Code Description: ICD-10 Diagnosis Description I87.331 Chronic venous hypertension (idiopathic) with ulcer and inflammation of righ L97.818 Non-pressure chronic  ulcer of other part of right lower leg with other speci Modifier: t lower extremit fied severity Quantity: y Engineer, maintenance) Signed: 12/30/2021 2:46:49 PM By: Worthy Keeler PA-C Entered By: Worthy Keeler on 12/30/2021 14:46:49

## 2021-12-30 NOTE — Progress Notes (Addendum)
[name], [name] ([id number]) Visit Report for 12/30/2021 Arrival Information Details Patient Name: [name], [name] Date of Service: 12/30/2021 1:00 PM Medical Record Number: [id number] Patient Account Number: [id number] Date of Birth/Sex: 10-Jun-1951 (70 y.o. M) Treating RN: Cornell Barman Primary Care Markey Deady: Barney Drain Other Clinician: Referring Xavien Dauphinais: Barney Drain Treating Dayne Dekay/Extender: Skipper Cliche in Treatment: 3 Visit Information History Since Last Visit Added or deleted any medications: No Patient Arrived: Wheel Chair Any new allergies or adverse reactions: No Arrival Time: 13:13 Had a fall or experienced change in No Accompanied By: self activities of daily living that may affect Transfer Assistance: Manual risk of falls: Patient Identification Verified: Yes Hospitalized since last visit: No Secondary Verification Process Completed: Yes Has Dressing in Place as Prescribed: Yes Patient Requires Transmission-Based No Has Compression in Place as Prescribed: Yes Precautions: Pain Present Now: No Patient Has Alerts: Yes Patient Alerts: R. ABI.91 TBI .76 12/02/20 L ABI 1.18 TBI.73 12/02/20 Electronic Signature(s) Signed: 12/30/2021 4:52:49 PM By: Gretta Cool, BSN, RN, CWS, Kim RN, BSN Entered By: Gretta Cool, BSN, RN, CWS, Kim on 12/30/2021 13:17:37 [name] ([id number]) -------------------------------------------------------------------------------- Clinic Level of Care Assessment Details Patient Name: [name] Date of Service: 12/30/2021 1:00 PM Medical Record Number: [id number] Patient Account Number: [id number] Date of Birth/Sex: 12-13-1950 (70 y.o. M) Treating RN: Cornell Barman Primary Care Natsumi Whitsitt: Barney Drain Other Clinician: Referring Binh Doten: Barney Drain Treating Lizza Huffaker/Extender: Skipper Cliche in Treatment: 3 Clinic Level of Care Assessment Items TOOL 4 Quantity Score []  - Use when only an EandM is performed on FOLLOW-UP  visit 0 ASSESSMENTS - Nursing Assessment / Reassessment X - Reassessment of Co-morbidities (includes updates in patient status) 1 10 X- 1 5 Reassessment of Adherence to Treatment Plan ASSESSMENTS - Wound and Skin Assessment / Reassessment X - Simple Wound Assessment / Reassessment - one wound 1 5 []  - 0 Complex Wound Assessment / Reassessment - multiple wounds []  - 0 Dermatologic / Skin Assessment (not related to wound area) ASSESSMENTS - Focused Assessment []  - Circumferential Edema Measurements - multi extremities 0 []  - 0 Nutritional Assessment / Counseling / Intervention []  - 0 Lower Extremity Assessment (monofilament, tuning fork, pulses) []  - 0 Peripheral Arterial Disease Assessment (using hand held doppler) ASSESSMENTS - Ostomy and/or Continence Assessment and Care []  - Incontinence Assessment and Management 0 []  - 0 Ostomy Care Assessment and Management (repouching, etc.) PROCESS - Coordination of Care X - Simple Patient / Family Education for ongoing care 1 15 []  - 0 Complex (extensive) Patient / Family Education for ongoing care X- 1 10 Staff obtains Programmer, systems, Records, Test Results / Process Orders []  - 0 Staff telephones HHA, Nursing Homes / Clarify orders / etc []  - 0 Routine Transfer to another Facility (non-emergent condition) []  - 0 Routine Hospital Admission (non-emergent condition) []  - 0 New Admissions / Biomedical engineer / Ordering NPWT, Apligraf, etc. []  - 0 Emergency Hospital Admission (emergent condition) X- 1 10 Simple Discharge Coordination []  - 0 Complex (extensive) Discharge Coordination PROCESS - Special Needs []  - Pediatric / Minor Patient Management 0 []  - 0 Isolation Patient Management []  - 0 Hearing / Language / Visual special needs []  - 0 Assessment of Community assistance (transportation, D/C planning, etc.) []  - 0 Additional assistance / Altered mentation []  - 0 Support Surface(s) Assessment (bed, cushion, seat,  etc.) INTERVENTIONS - Wound Cleansing / Measurement [name], [name] C. ([id number]) X- 1 5 Simple Wound Cleansing - one wound []  - 0 Complex Wound Cleansing - multiple  wounds X- 1 5 Wound Imaging (photographs - any number of wounds) []  - 0 Wound Tracing (instead of photographs) X- 1 5 Simple Wound Measurement - one wound []  - 0 Complex Wound Measurement - multiple wounds INTERVENTIONS - Wound Dressings []  - Small Wound Dressing one or multiple wounds 0 X- 1 15 Medium Wound Dressing one or multiple wounds []  - 0 Large Wound Dressing one or multiple wounds []  - 0 Application of Medications - topical []  - 0 Application of Medications - injection INTERVENTIONS - Miscellaneous []  - External ear exam 0 []  - 0 Specimen Collection (cultures, biopsies, blood, body fluids, etc.) []  - 0 Specimen(s) / Culture(s) sent or taken to Lab for analysis []  - 0 Patient Transfer (multiple staff / Civil Service fast streamer / Similar devices) []  - 0 Simple Staple / Suture removal (25 or less) []  - 0 Complex Staple / Suture removal (26 or more) []  - 0 Hypo / Hyperglycemic Management (close monitor of Blood Glucose) []  - 0 Ankle / Brachial Index (ABI) - do not check if billed separately X- 1 5 Vital Signs Has the patient been seen at the hospital within the last three years: Yes Total Score: 90 Level Of Care: New/Established - Level 3 Electronic Signature(s) Signed: 12/30/2021 4:52:49 PM By: Gretta Cool, BSN, RN, CWS, Kim RN, BSN Entered By: Gretta Cool, BSN, RN, CWS, Kim on 12/30/2021 14:18:12 [name] ([id number]) -------------------------------------------------------------------------------- Encounter Discharge Information Details Patient Name: [name] Date of Service: 12/30/2021 1:00 PM Medical Record Number: [id number] Patient Account Number: [id number] Date of Birth/Sex: [birth year] (70 y.o. M) Treating RN: Cornell Barman Primary Care Braeden Dolinski: Barney Drain Other Clinician: Referring  Arrie Zuercher: Barney Drain Treating Adan Baehr/Extender: Skipper Cliche in Treatment: 3 Encounter Discharge Information Items Discharge Condition: Stable Ambulatory Status: Wheelchair Discharge Destination: Home Transportation: Private Auto Accompanied By: self Schedule Follow-up Appointment: Yes Clinical Summary of Care: Electronic Signature(s) Signed: 12/30/2021 4:52:49 PM By: Gretta Cool, BSN, RN, CWS, Kim RN, BSN Entered By: Gretta Cool, BSN, RN, CWS, Kim on 12/30/2021 15:02:36 [name] ([id number]) -------------------------------------------------------------------------------- Lower Extremity Assessment Details Patient Name: [name] Date of Service: 12/30/2021 1:00 PM Medical Record Number: [id number] Patient Account Number: [id number] Date of Birth/Sex: Apr 09, 1951 (70 y.o. M) Treating RN: Cornell Barman Primary Care Dimarco Minkin: Barney Drain Other Clinician: Referring Gaelle Adriance: Barney Drain Treating Joanann Mies/Extender: Jeri Cos Weeks in Treatment: 3 Edema Assessment Assessed: [Left: No] [Right: No] Edema: [Left: Ye] [Right: s] Calf Left: Right: Point of Measurement: 35 cm From Medial Instep 34.2 cm Ankle Left: Right: Point of Measurement: 10 cm From Medial Instep 26 cm Knee To Floor Left: Right: From Medial Instep 47 cm Vascular Assessment Pulses: Dorsalis Pedis Palpable: [Right:Yes] Electronic Signature(s) Signed: 12/30/2021 4:52:49 PM By: Gretta Cool, BSN, RN, CWS, Kim RN, BSN Entered By: Gretta Cool, BSN, RN, CWS, Kim on 12/30/2021 13:30:31 [name] ([id number]) -------------------------------------------------------------------------------- Multi Wound Chart Details Patient Name: [name] Date of Service: 12/30/2021 1:00 PM Medical Record Number: [id number] Patient Account Number: [id number] Date of Birth/Sex: 02-04-1951 (70 y.o. M) Treating RN: Cornell Barman Primary Care Livan Hires: Barney Drain Other Clinician: Referring Lonya Johannesen: Barney Drain Treating Caylin Nass/Extender: Skipper Cliche in Treatment: 3 Vital Signs Height(in): 67 Pulse(bpm): 51 Weight(lbs): 180 Blood Pressure(mmHg): 113/75 Body Mass Index(BMI): 28.2 Temperature(F): 98.3 Respiratory Rate(breaths/min): 18 Photos: [N/A:N/A] Wound Location: Right, Medial Lower Leg N/A N/A Wounding Event: Gradually Appeared N/A N/A Primary Etiology: Venous Leg Ulcer N/A N/A Comorbid History: Asthma, Hypertension, History of N/A N/A pressure wounds, Neuropathy Date Acquired: 10/20/2021 N/A  N/A Weeks of Treatment: 3 N/A N/A Wound Status: Open N/A N/A Wound Recurrence: No N/A N/A Measurements L x W x D (cm) 2.5x1.7x0.1 N/A N/A Area (cm) : 3.338 N/A N/A Volume (cm) : 0.334 N/A N/A % Reduction in Area: 69.60% N/A N/A % Reduction in Volume: 69.60% N/A N/A Classification: Full Thickness Without Exposed N/A N/A Support Structures Exudate Amount: Medium N/A N/A Exudate Type: Serosanguineous N/A N/A Exudate Color: red, brown N/A N/A Wound Margin: Flat and Intact N/A N/A Granulation Amount: Small (1-33%) N/A N/A Granulation Quality: Pink, Pale, Hyper-granulation N/A N/A Necrotic Amount: Large (67-100%) N/A N/A Exposed Structures: Fat Layer (Subcutaneous Tissue): N/A N/A Yes Fascia: No Tendon: No Muscle: No Joint: No Bone: No Epithelialization: None N/A N/A Treatment Notes Electronic Signature(s) Signed: 12/30/2021 4:52:49 PM By: Gretta Cool, BSN, RN, CWS, Kim RN, BSN Entered By: Gretta Cool, BSN, RN, CWS, Kim on 12/30/2021 14:14:58 [name] ([id number]) -------------------------------------------------------------------------------- Multi-Disciplinary Care Plan Details Patient Name: [name] Date of Service: 12/30/2021 1:00 PM Medical Record Number: [id number] Patient Account Number: [id number] Date of Birth/Sex: 25-May-1951 (70 y.o. M) Treating RN: Cornell Barman Primary Care Kaloni Bisaillon: Barney Drain Other Clinician: Referring Star Resler: Barney Drain Treating Dellanira Dillow/Extender: Skipper Cliche in Treatment: 3 Active Inactive Wound/Skin Impairment Nursing Diagnoses: Knowledge deficit related to ulceration/compromised skin integrity Goals: Patient/caregiver will verbalize understanding of skin care regimen Date Initiated: 12/09/2021 Target Resolution Date: 01/09/2022 Goal Status: Active Ulcer/skin breakdown will have a volume reduction of 30% by week 4 Date Initiated: 12/09/2021 Target Resolution Date: 02/06/2022 Goal Status: Active Ulcer/skin breakdown will have a volume reduction of 50% by week 8 Date Initiated: 12/09/2021 Target Resolution Date: 03/09/2022 Goal Status: Active Ulcer/skin breakdown will have a volume reduction of 80% by week 12 Date Initiated: 12/09/2021 Target Resolution Date: 04/08/2022 Goal Status: Active Ulcer/skin breakdown will heal within 14 weeks Date Initiated: 12/09/2021 Target Resolution Date: 05/09/2022 Goal Status: Active Interventions: Assess patient/caregiver ability to obtain necessary supplies Assess patient/caregiver ability to perform ulcer/skin care regimen upon admission and as needed Assess ulceration(s) every visit Notes: Electronic Signature(s) Signed: 12/30/2021 4:52:49 PM By: Gretta Cool, BSN, RN, CWS, Kim RN, BSN Entered By: Gretta Cool, BSN, RN, CWS, Kim on 12/30/2021 14:14:39 [name] ([id number]) -------------------------------------------------------------------------------- Pain Assessment Details Patient Name: [name] Date of Service: 12/30/2021 1:00 PM Medical Record Number: [id number] Patient Account Number: [id number] Date of Birth/Sex: 17-Mar-1951 (70 y.o. M) Treating RN: Cornell Barman Primary Care Kawana Hegel: Barney Drain Other Clinician: Referring Kianah Harries: Barney Drain Treating Mazzy Santarelli/Extender: Skipper Cliche in Treatment: 3 Active Problems Location of Pain Severity and Description of Pain Patient Has Paino No Site Locations Pain Management and  Medication Current Pain Management: Electronic Signature(s) Signed: 12/30/2021 4:52:49 PM By: Gretta Cool, BSN, RN, CWS, Kim RN, BSN Entered By: Gretta Cool, BSN, RN, CWS, Kim on 12/30/2021 13:19:21 [name] ([id number]) -------------------------------------------------------------------------------- Patient/Caregiver Education Details Patient Name: [name] Date of Service: 12/30/2021 1:00 PM Medical Record Number: [id number] Patient Account Number: [id number] Date of Birth/Gender: November 24, 1950 (70 y.o. M) Treating RN: Cornell Barman Primary Care Physician: Barney Drain Other Clinician: Referring Physician: Barney Drain Treating Physician/Extender: Skipper Cliche in Treatment: 3 Education Assessment Education Provided To: Patient Education Topics Provided Wound/Skin Impairment: Handouts: Caring for Your Ulcer Methods: Demonstration, Explain/Verbal Responses: State content correctly Electronic Signature(s) Signed: 12/30/2021 4:52:49 PM By: Gretta Cool, BSN, RN, CWS, Kim RN, BSN Entered By: Gretta Cool, BSN, RN, CWS, Kim on 12/30/2021 14:21:13 [name] ([id number]) -------------------------------------------------------------------------------- Wound Assessment Details Patient Name: [name] Date  of Service: 12/30/2021 1:00 PM Medical Record Number: [id number] Patient Account Number: [id number] Date of Birth/Sex: [birth year] (70 y.o. M) Treating RN: Cornell Barman Primary Care Geddy Boydstun: Barney Drain Other Clinician: Referring Vikash Nest: Barney Drain Treating Milanya Sunderland/Extender: Jeri Cos Weeks in Treatment: 3 Wound Status Wound Number: 3 Primary Venous Leg Ulcer Etiology: Wound Location: Right, Medial Lower Leg Wound Status: Open Wounding Event: Gradually Appeared Comorbid Asthma, Hypertension, History of pressure wounds, Date Acquired: 10/20/2021 History: Neuropathy Weeks Of Treatment: 3 Clustered Wound: No Photos Wound Measurements Length: (cm)  2.5 Width: (cm) 1.7 Depth: (cm) 0.1 Area: (cm) 3.338 Volume: (cm) 0.334 % Reduction in Area: 69.6% % Reduction in Volume: 69.6% Epithelialization: None Tunneling: No Undermining: No Wound Description Classification: Full Thickness Without Exposed Support Structures Wound Margin: Flat and Intact Exudate Amount: Medium Exudate Type: Serosanguineous Exudate Color: red, brown Foul Odor After Cleansing: No Slough/Fibrino Yes Wound Bed Granulation Amount: Small (1-33%) Exposed Structure Granulation Quality: Pink, Pale, Hyper-granulation Fascia Exposed: No Necrotic Amount: Large (67-100%) Fat Layer (Subcutaneous Tissue) Exposed: Yes Necrotic Quality: Adherent Slough Tendon Exposed: No Muscle Exposed: No Joint Exposed: No Bone Exposed: No Treatment Notes Wound #3 (Lower Leg) Wound Laterality: Right, Medial Cleanser Normal Saline Discharge Instruction: Wash your hands with soap and water. Remove old dressing, discard into plastic bag and place into trash. Cleanse the wound with Normal Saline prior to applying a clean dressing using gauze sponges, not tissues or cotton balls. Do not scrub or use excessive force. Pat dry using gauze sponges, not tissue or cotton balls. [name], [name] ([id number]) Peri-Wound Care Topical Triamcinolone Acetonide Cream, 0.1%, 15 (g) tube Discharge Instruction: apply red areas to peri wound Primary Dressing Hydrofera Blue Ready Transfer Foam, 4x5 (in/in) Discharge Instruction: Apply Hydrofera Blue Ready to wound bed as directed Secondary Dressing Secured With Conforming Stretch Gauze Bandage 4x75 (in/in) Discharge Instruction: Apply as directed Tubigrip Size E, 3.5x10 (in/yds) Discharge Instruction: Apply 3 Tubigrip E 3-finger-widths below knee to base of toes to secure dressing and/or for swelling. Compression Wrap Compression Stockings Add-Ons Electronic Signature(s) Signed: 12/30/2021 4:52:49 PM By: Gretta Cool, BSN, RN, CWS, Kim RN,  BSN Entered By: Gretta Cool, BSN, RN, CWS, Kim on 12/30/2021 13:26:48 [name] ([id number]) -------------------------------------------------------------------------------- Vitals Details Patient Name: [name] Date of Service: 12/30/2021 1:00 PM Medical Record Number: [id number] Patient Account Number: [id number] Date of Birth/Sex: 02/01/1951 (70 y.o. M) Treating RN: Cornell Barman Primary Care Maricus Tanzi: Barney Drain Other Clinician: Referring Kylee Umana: Barney Drain Treating Malissa Slay/Extender: Skipper Cliche in Treatment: 3 Vital Signs Time Taken: 13:17 Temperature (F): 98.3 Height (in): 67 Pulse (bpm): 73 Source: Stated Respiratory Rate (breaths/min): 18 Weight (lbs): 180 Blood Pressure (mmHg): 113/75 Source: Stated Reference Range: 80 - 120 mg / dl Body Mass Index (BMI): 28.2 Electronic Signature(s) Signed: 12/30/2021 4:52:49 PM By: Gretta Cool, BSN, RN, CWS, Kim RN, BSN Entered By: Gretta Cool, BSN, RN, CWS, Kim on 12/30/2021 13:18:40

## 2022-01-12 ENCOUNTER — Encounter: Payer: Medicare (Managed Care) | Admitting: Physician Assistant

## 2022-01-12 ENCOUNTER — Other Ambulatory Visit: Payer: Self-pay

## 2022-01-12 DIAGNOSIS — I87331 Chronic venous hypertension (idiopathic) with ulcer and inflammation of right lower extremity: Secondary | ICD-10-CM | POA: Diagnosis not present

## 2022-01-12 NOTE — Progress Notes (Addendum)
[name] ([id number]) Visit Report for 01/12/2022 Chief Complaint Document Details Patient Name: [name], [name] Date of Service: 01/12/2022 10:00 AM Medical Record Number: [id number] Patient Account Number: [id number] Date of Birth/Sex: 1951-02-28 (71 y.o. M) Treating RN: Donnamarie Poag Primary Care Provider: Barney Drain Other Clinician: Referring Provider: Barney Drain Treating Provider/Extender: Skipper Cliche in Treatment: 4 Information Obtained from: Patient Chief Complaint Right LE Ulcer Electronic Signature(s) Signed: 01/12/2022 10:17:58 AM By: Worthy Keeler PA-C Entered By: Worthy Keeler on 01/12/2022 10:17:58 [name] ([id number]) -------------------------------------------------------------------------------- Debridement Details Patient Name: [name] Date of Service: 01/12/2022 10:00 AM Medical Record Number: [id number] Patient Account Number: [id number] Date of Birth/Sex: 1951-11-02 (71 y.o. M) Treating RN: Levora Dredge Primary Care Provider: Barney Drain Other Clinician: Referring Provider: Barney Drain Treating Provider/Extender: Skipper Cliche in Treatment: 4 Debridement Performed for Wound #3 Right,Medial Lower Leg Assessment: Performed By: Physician Tommie Sams., PA-C Debridement Type: Debridement Severity of Tissue Pre Debridement: Fat layer exposed Level of Consciousness (Pre- Awake and Alert procedure): Pre-procedure Verification/Time Out Yes - 10:20 Taken: Total Area Debrided (L x W): 1 (cm) x 0.8 (cm) = 0.8 (cm) Tissue and other material Viable, Non-Viable, Slough, Subcutaneous, Biofilm, Slough debrided: Level: Skin/Subcutaneous Tissue Debridement Description: Excisional Instrument: Curette Bleeding: Minimum Hemostasis Achieved: Pressure Response to Treatment: Procedure was tolerated well Level of Consciousness (Post- Awake and Alert procedure): Post Debridement Measurements of Total  Wound Length: (cm) 1 Width: (cm) 0.8 Depth: (cm) 0.2 Volume: (cm) 0.126 Character of Wound/Ulcer Post Debridement: Stable Severity of Tissue Post Debridement: Fat layer exposed Post Procedure Diagnosis Same as Pre-procedure Electronic Signature(s) Signed: 01/12/2022 4:04:42 PM By: Levora Dredge Signed: 01/13/2022 4:05:24 PM By: Worthy Keeler PA-C Entered By: Levora Dredge on 01/12/2022 10:22:26 [name] ([id number]) -------------------------------------------------------------------------------- HPI Details Patient Name: [name] Date of Service: 01/12/2022 10:00 AM Medical Record Number: [id number] Patient Account Number: [id number] Date of Birth/Sex: 1951/01/27 (71 y.o. M) Treating RN: Donnamarie Poag Primary Care Provider: Barney Drain Other Clinician: Referring Provider: Barney Drain Treating Provider/Extender: Skipper Cliche in Treatment: 4 History of Present Illness HPI Description: 12/21/2020 upon evaluation today patient appears to be doing somewhat poorly in regard to his right lower extremity. He does have some erythema and is also having issues with quite a bit of drainage currently from a wound that he tells me has been present since about 2 months ago when he initially had an injury that led to this wound beginning. He does have chronic venous stasis and is secondary to this I think it has been more difficult to heal this wound than normal. With that being said the patient tells me that this tends to drain a lot down onto his feet. He is also had some issues with fungal infection he is currently on miconazole powder for that. Fortunately there is no signs of active infection systemically at this time. No fevers, chills, nausea, vomiting, or diarrhea. He does have pain but again this is more to the posterior aspect not really more lateral or anterior. There is evidence that he may have some local infection due to erythema and warmth surrounding the  wound post debridement I do want to obtain a wound culture to evaluate and see if there is anything indeed of concern here. He did have arterial studies which revealed that he had a ABI of 0.91 on the right with a TBI of 0.76 in the left his ABI was 1.18 with a TBI of  0.73. He has good blood flow here. In regard to his x-ray that was taken on 11/30/2020 there was no evidence of osseous abnormality. The arterial study was actually performed on 12/03/2020. The patient was hospitalized from 11/30/2020 through 12/02/2020 during that time he was on IV antibiotics including vancomycin and cefepime. 12/28/2020 upon evaluation today patient appears to be doing somewhat poorly in regard to his legs currently is having a lot of drainage unfortunately which again he was not actually being wrapped with a 3 layer compression wrap after this was changed at [facility]. Subsequently I think that the patient does need to have ongoing compression therapy. Right now that means that were doing the compression wrap later he will need compression stockings. Either way though I think that he cannot just have a border foam dressing on this which was what was on today that is not sufficient to catch any of the drainage and to be honest I think this can lead to infection and greater issues here for him. The wound is pretty much about circumferential at this point a weeping area included. 01/04/2021 upon evaluation today patient unfortunately is doing quite a bit worse in regard to the overall appearance of his wound from the standpoint of infection. I did see evidence of bright green drainage consistent with Pseudomonas which I discussed with the patient today as well as his representative from social services. Nonetheless I believe that we likely need to treat him for Pseudomonas right now the clindamycin he is on is not covering for this. With that being said no sharp debridement was performed today. Of note the patient's Hydrofera Blue  dressing was not the ready transfer version and unfortunately did cause this did not drain quite as well as we would like to. Nonetheless I do feel like that the patient would benefit from continuing with the wraps changed out their [facility] I think they can definitely do a good job and his swelling is under good control the big thing here is that we need to make sure that the dressing is applied appropriately we will contact them just to confirm and make sure that they know the appropriate way to apply the [facility] that they have. 01/11/2021 upon evaluation today patient appears to be doing somewhat better in regard to the erythema on his leg at this point. Fortunately there are no signs of active infection at this time. No fevers, chills, nausea, vomiting, or diarrhea. With that being said he has been tolerating the dressing changes without complication and very pleased in that regard. I do think that he seems to be making good progress in general which is good news. I think that the biggest issue I see right now is simply the [facility] that is being used cannot transfer through to the [place] and therefore extracting a lot more fluid against the skin which is causing some irritation as well. I think that we may be able to wrap him here and get things in a better position for him to be honest. 01/18/2021 upon evaluation today patient appears to be doing better in regard to his leg. I feel like he is definitely making improvement and overall I think that this is significantly improved compared to the previous evaluation. With that being said there is still quite a ways to go to get this to heal and it is going to take quite a bit of time. Fortunately there is no evidence of active infection at this time. No fevers, chills,  nausea, vomiting, or diarrhea. 01/25/2021 upon evaluation today patient appears to be doing well with regard to his leg. I feel like he is definitely showing signs of  improvement which is great news. There is no evidence of active infection at this time. No fevers, chills, nausea, vomiting, or diarrhea. 02/01/2021 upon evaluation today patient appears to be doing decently well in regard to his leg on the right as compared to previously noted. There does not appear to be any signs of active infection at this time which is great news and overall I am pleased that his systemically. Nonetheless locally still show signs of erythema he still has several days left of his antibiotic as apparently this was not started immediately after I wrote that for him last time. Nonetheless I think that he is taking the Levaquin now and hopefully its doing job to help clear things up. Nonetheless if he is not significantly better by next week I would likely repeat the culture to try to see if anything is changed as again he still has some erythema this week that has me concerned at this point. 02/07/2021 on evaluation today patient actually appears to be doing well with regard to his wounds in his far as size and overall appearance is concerned. He still having a tremendous amount of drainage she has now completed the Levaquin and unfortunately as opposed to improving the erythema actually seems to have worsened. Has been off the Levaquin for several days therefore I did obtain a new culture today. With that being said I nonetheless feel like that the patient is showing signs of ongoing issues with cellulitis and to be honest not only is he having issues on the right leg which is what we have been monitoring here but he now is having issues on the left leg as well unfortunately. This is even up and around the knee and again I think this is of significant concern to the point that I feel like he should go to the ER for further evaluation and treatment. 07/19/2021 upon evaluation today patient appears for reevaluation here in the clinic last time I saw him was actually March 2022. At that  time I sent him to the hospital due to the fact that he had a significant infection. Subsequently upon discharge from the hospital he was sent to a skilled nursing facility and I have not seen him since that time. Fortunately there does not appear to be any evidence of active infection at this time. No fevers, chills, nausea, vomiting, or diarrhea. With that being said I am somewhat concerned about the overall appearance of the wound bed. Now that it is consolidated to a much smaller area I feel like that the wound is somewhat irregular in the way it appears. For that reason I really do think that we need to probably go ahead and see about doing a biopsy to test for the possibility of a skin cancer here. The patient is in agreement with that plan. [name], [name] ([id number]) 08/01/2021 upon evaluation today patient is actually seen for an early appointment that he was actually supposed to be here on Thursday he comes in 3 days early due to the fact that the provider at [facility] felt like he needed to be seen sooner due to the severity of the wound. It does indeed appear to be doing significantly worse compared to the last time I saw him 2 weeks ago. In fact this is probably about 3 times the size  it was then as far as the skin breakdown. There is also erythema and warmth and I am concerned about that as well. I think that the patient probably needs to go to the ER for further evaluation and treatment potentially IV antibiotics is what is can to be necessary here. READMISSION 12/09/2021 This is a 71 year old man who we know fairly well in this facility. 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 He had 2 visits in follow-up in August and the last time was sent to the ER with cellulitis of the right lower extremity. I see through looking in epic he was hospitalized once in September and once in December for cellulitis of the right lower extremity and a  chronic venous ulcer. The patient is a resident of the [facility]. He had Hydrofera Blue on the wound and kerlix when he came in. He has very significant erythema more distally towards the ankle and more proximally on the medial right leg. But this is not particularly tender he is not systemically unwell. The patient had ABIs TBI's in January 2022 at 0.910.76 on the right and 1.18 then 0.73 on the left respectively. 12/30/2021 upon evaluation today patient appears to be doing well with regard to his wound. He is actually measuring significantly smaller which is great news and overall I am extremely pleased with how Mr. Golla appears today. I do not see any signs of active infection locally or systemically at this point which is great news. 01/12/2022 upon evaluation today patient appears to be doing well with regard to his wounds. He has been tolerating the dressing changes without complication. He came in with nothing on today as he said at the facility they said they wanted to "leave it open to air". Nonetheless that is not the ordering is not what should be done into I given order otherwise. Electronic Signature(s) Signed: 01/12/2022 10:26:16 AM By: Worthy Keeler PA-C Entered By: Worthy Keeler on 01/12/2022 10:26:16 [name] ([id number]) -------------------------------------------------------------------------------- Physical Exam Details Patient Name: [name] Date of Service: 01/12/2022 10:00 AM Medical Record Number: [id number] Patient Account Number: [id number] Date of Birth/Sex: August 20, 1951 (71 y.o. M) Treating RN: Donnamarie Poag Primary Care Provider: Barney Drain Other Clinician: Referring Provider: Barney Drain Treating Provider/Extender: Skipper Cliche in Treatment: 4 Constitutional Well-nourished and well-hydrated in no acute distress. Respiratory normal breathing without difficulty. Psychiatric this patient is able to make decisions and  demonstrates good insight into disease process. Alert and Oriented x 3. pleasant and cooperative. Notes Upon inspection patient's wound bed actually showed signs of good granulation and epithelization at this point. Fortunately I do not see any signs of active infection locally or systemically which is great news and overall very pleased with where we stand today. No fevers, chills, nausea, vomiting, or diarrhea. I did perform debridement to clear away some of the necrotic debris he tolerated that today postdebridement wound bed appears to be doing much better. Electronic Signature(s) Signed: 01/12/2022 10:26:42 AM By: Worthy Keeler PA-C Entered By: Worthy Keeler on 01/12/2022 10:26:41 [name] ([id number]) -------------------------------------------------------------------------------- Physician Orders Details Patient Name: [name] Date of Service: 01/12/2022 10:00 AM Medical Record Number: [id number] Patient Account Number: [id number] Date of Birth/Sex: 21-Feb-1951 (71 y.o. M) Treating RN: Levora Dredge Primary Care Provider: Barney Drain Other Clinician: Referring Provider: Barney Drain Treating Provider/Extender: Skipper Cliche in Treatment: 4 Verbal / Phone Orders: No Diagnosis Coding  ICD-10 Coding Code Description I87.331 Chronic venous hypertension (idiopathic) with ulcer and inflammation of right lower extremity L97.818 Non-pressure chronic ulcer of other part of right lower leg with other specified severity Follow-up Appointments o Return Appointment in 1 week. Bathing/ Shower/ Hygiene o May shower with wound dressing protected with water repellent cover or cast protector. o Other: - Tubi E placed on LLL, ok per PA Stone Edema Control - Lymphedema / Segmental Compressive Device / Other o Elevate, Exercise Daily and Avoid Standing for Long Periods of Time. o Elevate legs to the level of the heart and pump ankles as often as  possible o Elevate leg(s) parallel to the floor when sitting. Wound Treatment Wound #3 - Lower Leg Wound Laterality: Right, Medial Cleanser: Normal Saline 2 x Per Week/30 Days Discharge Instructions: Wash your hands with soap and water. Remove old dressing, discard into plastic bag and place into trash. Cleanse the wound with Normal Saline prior to applying a clean dressing using gauze sponges, not tissues or cotton balls. Do not scrub or use excessive force. Pat dry using gauze sponges, not tissue or cotton balls. Topical: Triamcinolone Acetonide Cream, 0.1%, 15 (g) tube 2 x Per Week/30 Days Discharge Instructions: apply red areas to peri wound Primary Dressing: Hydrofera Blue Ready Transfer Foam, 4x5 (in/in) 2 x Per Week/30 Days Discharge Instructions: Apply Hydrofera Blue Ready to wound bed as directed Secured With: Conforming Stretch Gauze Bandage 4x75 (in/in) 2 x Per Week/30 Days Discharge Instructions: Apply as directed Secured With: Tubigrip Size E, 3.5x10 (in/yds) 2 x Per Week/30 Days Discharge Instructions: Apply 3 Tubigrip E 3-finger-widths below knee to base of toes to secure dressing and/or for swelling. Electronic Signature(s) Signed: 01/12/2022 4:04:42 PM By: Levora Dredge Signed: 01/13/2022 4:05:24 PM By: Worthy Keeler PA-C Entered By: Levora Dredge on 01/12/2022 10:34:55 [name] ([id number]) -------------------------------------------------------------------------------- Problem List Details Patient Name: [name] Date of Service: 01/12/2022 10:00 AM Medical Record Number: [id number] Patient Account Number: [id number] Date of Birth/Sex: 10-30-1951 (71 y.o. M) Treating RN: Donnamarie Poag Primary Care Provider: Barney Drain Other Clinician: Referring Provider: Barney Drain Treating Provider/Extender: Skipper Cliche in Treatment: 4 Active Problems ICD-10 Encounter Code Description Active Date MDM Diagnosis I87.331 Chronic venous  hypertension (idiopathic) with ulcer and inflammation of 12/09/2021 No Yes right lower extremity L97.818 Non-pressure chronic ulcer of other part of right lower leg with other 12/09/2021 No Yes specified severity Inactive Problems Resolved Problems Electronic Signature(s) Signed: 01/12/2022 10:17:53 AM By: Worthy Keeler PA-C Entered By: Worthy Keeler on 01/12/2022 10:17:53 [name] ([id number]) -------------------------------------------------------------------------------- Progress Note Details Patient Name: [name] Date of Service: 01/12/2022 10:00 AM Medical Record Number: [id number] Patient Account Number: [id number] Date of Birth/Sex: December 12, 1950 (71 y.o. M) Treating RN: Donnamarie Poag Primary Care Provider: Barney Drain Other Clinician: Referring Provider: Barney Drain Treating Provider/Extender: Skipper Cliche in Treatment: 4 Subjective Chief Complaint Information obtained from Patient Right LE Ulcer History of Present Illness (HPI) 12/21/2020 upon evaluation today patient appears to be doing somewhat poorly in regard to his right lower extremity. He does have some erythema and is also having issues with quite a bit of drainage currently from a wound that he tells me has been present since about 2 months ago when he initially had an injury that led to this wound beginning. He does have chronic venous stasis and is secondary to this I think it has been more difficult to heal this wound than normal. With that being said  the patient tells me that this tends to drain a lot down onto his feet. He is also had some issues with fungal infection he is currently on miconazole powder for that. Fortunately there is no signs of active infection systemically at this time. No fevers, chills, nausea, vomiting, or diarrhea. He does have pain but again this is more to the posterior aspect not really more lateral or anterior. There is evidence that he may have some local  infection due to erythema and warmth surrounding the wound post debridement I do want to obtain a wound culture to evaluate and see if there is anything indeed of concern here. He did have arterial studies which revealed that he had a ABI of 0.91 on the right with a TBI of 0.76 in the left his ABI was 1.18 with a TBI of 0.73. He has good blood flow here. In regard to his x-ray that was taken on 11/30/2020 there was no evidence of osseous abnormality. The arterial study was actually performed on 12/03/2020. The patient was hospitalized from 11/30/2020 through 12/02/2020 during that time he was on IV antibiotics including vancomycin and cefepime. 12/28/2020 upon evaluation today patient appears to be doing somewhat poorly in regard to his legs currently is having a lot of drainage unfortunately which again he was not actually being wrapped with a 3 layer compression wrap after this was changed at [facility]. Subsequently I think that the patient does need to have ongoing compression therapy. Right now that means that were doing the compression wrap later he will need compression stockings. Either way though I think that he cannot just have a border foam dressing on this which was what was on today that is not sufficient to catch any of the drainage and to be honest I think this can lead to infection and greater issues here for him. The wound is pretty much about circumferential at this point a weeping area included. 01/04/2021 upon evaluation today patient unfortunately is doing quite a bit worse in regard to the overall appearance of his wound from the standpoint of infection. I did see evidence of bright green drainage consistent with Pseudomonas which I discussed with the patient today as well as his representative from social services. Nonetheless I believe that we likely need to treat him for Pseudomonas right now the clindamycin he is on is not covering for this. With that being said no sharp debridement was  performed today. Of note the patient's Hydrofera Blue dressing was not the ready transfer version and unfortunately did cause this did not drain quite as well as we would like to. Nonetheless I do feel like that the patient would benefit from continuing with the wraps changed out their [facility] I think they can definitely do a good job and his swelling is under good control the big thing here is that we need to make sure that the dressing is applied appropriately we will contact them just to confirm and make sure that they know the appropriate way to apply the [facility] that they have. 01/11/2021 upon evaluation today patient appears to be doing somewhat better in regard to the erythema on his leg at this point. Fortunately there are no signs of active infection at this time. No fevers, chills, nausea, vomiting, or diarrhea. With that being said he has been tolerating the dressing changes without complication and very pleased in that regard. I do think that he seems to be making good progress in general which is good news.  I think that the biggest issue I see right now is simply the [facility] that is being used cannot transfer through to the [employer] and therefore extracting a lot more fluid against the skin which is causing some irritation as well. I think that we may be able to wrap him here and get things in a better position for him to be honest. 01/18/2021 upon evaluation today patient appears to be doing better in regard to his leg. I feel like he is definitely making improvement and overall I think that this is significantly improved compared to the previous evaluation. With that being said there is still quite a ways to go to get this to heal and it is going to take quite a bit of time. Fortunately there is no evidence of active infection at this time. No fevers, chills, nausea, vomiting, or diarrhea. 01/25/2021 upon evaluation today patient appears to be doing well with regard to his leg. I  feel like he is definitely showing signs of improvement which is great news. There is no evidence of active infection at this time. No fevers, chills, nausea, vomiting, or diarrhea. 02/01/2021 upon evaluation today patient appears to be doing decently well in regard to his leg on the right as compared to previously noted. There does not appear to be any signs of active infection at this time which is great news and overall I am pleased that his systemically. Nonetheless locally still show signs of erythema he still has several days left of his antibiotic as apparently this was not started immediately after I wrote that for him last time. Nonetheless I think that he is taking the Levaquin now and hopefully its doing job to help clear things up. Nonetheless if he is not significantly better by next week I would likely repeat the culture to try to see if anything is changed as again he still has some erythema this week that has me concerned at this point. 02/07/2021 on evaluation today patient actually appears to be doing well with regard to his wounds in his far as size and overall appearance is concerned. He still having a tremendous amount of drainage she has now completed the Levaquin and unfortunately as opposed to improving the erythema actually seems to have worsened. Has been off the Levaquin for several days therefore I did obtain a new culture today. With that being said I nonetheless feel like that the patient is showing signs of ongoing issues with cellulitis and to be honest not only is he having issues on the right leg which is what we have been monitoring here but he now is having issues on the left leg as well unfortunately. This is even up and around the knee and again I think this is of significant concern to the point that I feel like he should go to the ER for further evaluation and treatment. 07/19/2021 upon evaluation today patient appears for reevaluation here in the clinic last time I  saw him was actually March 2022. At that time I sent him to the hospital due to the fact that he had a significant infection. Subsequently upon discharge from the hospital he was sent to a skilled [name] ([id number][facility] facility and I have not seen him since that time. Fortunately there does not appear to be any evidence of active infection at this time. No fevers, chills, nausea, vomiting, or diarrhea. With that being said I am somewhat concerned about the overall appearance of the wound bed.  Now that it is consolidated to a much smaller area I feel like that the wound is somewhat irregular in the way it appears. For that reason I really do think that we need to probably go ahead and see about doing a biopsy to test for the possibility of a skin cancer here. The patient is in agreement with that plan. 08/01/2021 upon evaluation today patient is actually seen for an early appointment that he was actually supposed to be here on Thursday he comes in 3 days early due to the fact that the provider at [facility] felt like he needed to be seen sooner due to the severity of the wound. It does indeed appear to be doing significantly worse compared to the last time I saw him 2 weeks ago. In fact this is probably about 3 times the size it was then as far as the skin breakdown. There is also erythema and warmth and I am concerned about that as well. I think that the patient probably needs to go to the ER for further evaluation and treatment potentially IV antibiotics is what is can to be necessary here. READMISSION 12/09/2021 This is a 71 year old man who we know fairly well in this facility. 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 He had 2 visits in follow-up in August and the last time was sent to the ER with cellulitis of the right lower extremity. I see through looking in epic he was hospitalized once in September and once in December for  cellulitis of the right lower extremity and a chronic venous ulcer. The patient is a resident of the [facility]. He had Hydrofera Blue on the wound and kerlix when he came in. He has very significant erythema more distally towards the ankle and more proximally on the medial right leg. But this is not particularly tender he is not systemically unwell. The patient had ABIs TBI's in January 2022 at 0.910.76 on the right and 1.18 then 0.73 on the left respectively. 12/30/2021 upon evaluation today patient appears to be doing well with regard to his wound. He is actually measuring significantly smaller which is great news and overall I am extremely pleased with how Mr. Savant appears today. I do not see any signs of active infection locally or systemically at this point which is great news. 01/12/2022 upon evaluation today patient appears to be doing well with regard to his wounds. He has been tolerating the dressing changes without complication. He came in with nothing on today as he said at the facility they said they wanted to "leave it open to air". Nonetheless that is not the ordering is not what should be done into I given order otherwise. Objective Constitutional Well-nourished and well-hydrated in no acute distress. Vitals Time Taken: 10:05 AM, Height: 67 in, Weight: 180 lbs, BMI: 28.2, Temperature: 98 F, Pulse: 68 bpm, Respiratory Rate: 18 breaths/min, Blood Pressure: 119/68 mmHg. Respiratory normal breathing without difficulty. Psychiatric this patient is able to make decisions and demonstrates good insight into disease process. Alert and Oriented x 3. pleasant and cooperative. General Notes: Upon inspection patient's wound bed actually showed signs of good granulation and epithelization at this point. Fortunately I do not see any signs of active infection locally or systemically which is great news and overall very pleased with where we stand today. No fevers, chills, nausea, vomiting,  or diarrhea. I did perform debridement to clear away some of the necrotic  debris he tolerated that today postdebridement wound bed appears to be doing much better. Integumentary (Hair, Skin) Wound #3 status is Open. Original cause of wound was Gradually Appeared. The date acquired was: 10/20/2021. The wound has been in treatment 4 weeks. The wound is located on the Right,Medial Lower Leg. The wound measures 1cm length x 0.8cm width x 0.1cm depth; 0.628cm^2 area and 0.063cm^3 volume. There is Fat Layer (Subcutaneous Tissue) exposed. There is no tunneling or undermining noted. There is a medium amount of serosanguineous drainage noted. The wound margin is flat and intact. There is small (1-33%) pink, pale, hyper - granulation within the wound bed. There is a large (67-100%) amount of necrotic tissue within the wound bed. 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 There was a Excisional Skin/Subcutaneous Tissue Debridement with a total area of 0.8 sq cm performed by Tommie Sams., PA-C. With the following instrument(s): Curette to remove Viable and Non-Viable tissue/material. Material removed includes Subcutaneous Tissue, Slough, and Biofilm. No specimens were taken. A time out was conducted at 10:20, prior to the start of the procedure. A Minimum amount of bleeding was controlled with Pressure. The procedure was tolerated well. Post Debridement Measurements: 1cm length x 0.8cm width x 0.2cm depth; 0.126cm^3 volume. Character of Wound/Ulcer Post Debridement is stable.  Severity of Tissue Post Debridement is: Fat layer exposed. Post procedure Diagnosis Wound #3: Same as Pre-Procedure Plan Follow-up Appointments: Return Appointment in 1 week. Bathing/ Shower/ Hygiene: May shower with wound dressing protected with water repellent cover or cast protector. Edema Control - Lymphedema / Segmental Compressive Device / Other: Optional: One layer of unna paste to top of compression wrap (to act as an anchor). Elevate, Exercise Daily and Avoid Standing for Long Periods of Time. Elevate legs to the level of the heart and pump ankles as often as possible Elevate leg(s) parallel to the floor when sitting. WOUND #3: - Lower Leg Wound Laterality: Right, Medial Cleanser: Normal Saline 2 x Per Week/30 Days Discharge Instructions: Wash your hands with soap and water. Remove old dressing, discard into plastic bag and place into trash. Cleanse the wound with Normal Saline prior to applying a clean dressing using gauze sponges, not tissues or cotton balls. Do not scrub or use excessive force. Pat dry using gauze sponges, not tissue or cotton balls. Topical: Triamcinolone Acetonide Cream, 0.1%, 15 (g) tube 2 x Per Week/30 Days Discharge Instructions: apply red areas to peri wound Primary Dressing: Hydrofera Blue Ready Transfer Foam, 4x5 (in/in) 2 x Per Week/30 Days Discharge Instructions: Apply Hydrofera Blue Ready to wound bed as directed Secured With: Conforming Stretch Gauze Bandage 4x75 (in/in) 2 x Per Week/30 Days Discharge Instructions: Apply as directed Secured With: Tubigrip Size E, 3.5x10 (in/yds) 2 x Per Week/30 Days Discharge Instructions: Apply 3 Tubigrip E 3-finger-widths below knee to base of toes to secure dressing and/or for swelling. 1. Would recommend currently that we continue with the [facility] I think that is doing a good job. 2. Also can recommend that we have the patient continue with the ABD pad and roll gauze to secure in place. 3. We will  continue  with the double layer Tubigrip as well which I think has been beneficial. We will see patient back for reevaluation in 1 week here in the clinic. If anything worsens or changes patient will contact our office for additional recommendations. Electronic Signature(s) Signed: 01/12/2022 10:27:07 AM By: Worthy Keeler PA-C Entered By: Worthy Keeler on 01/12/2022 10:27:07 [name] ([id number]) -------------------------------------------------------------------------------- SuperBill Details Patient Name: [name] Date of Service: 01/12/2022 Medical Record Number: [id number] Patient Account Number: [id number] Date of Birth/Sex: [birth year] (71 y.o. M) Treating RN: Donnamarie Poag Primary Care Provider: Barney Drain Other Clinician: Referring Provider: Barney Drain Treating Provider/Extender: Skipper Cliche in Treatment: 4 Diagnosis Coding ICD-10 Codes Code Description [phone] Chronic venous hypertension (idiopathic) with ulcer and inflammation of right lower extremity L97.818 Non-pressure chronic ulcer of other part of right lower leg with other specified severity Facility Procedures CPT4 Code: 06301601 Description: 09323 - DEB SUBQ TISSUE 20 SQ CM/< Modifier: Quantity: 1 CPT4 Code: Description: ICD-10 Diagnosis Description L97.818 Non-pressure chronic ulcer of other part of right lower leg with other spec Modifier: ified severity Quantity: Physician Procedures CPT4 Code: 5573220 Description: 25427 - WC PHYS SUBQ TISS 20 SQ CM Modifier: Quantity: 1 CPT4 Code: Description: ICD-10 Diagnosis Description L97.818 Non-pressure chronic ulcer of other part of right lower leg with other spec Modifier: ified severity Quantity: Electronic Signature(s) Signed: 01/12/2022 10:27:29 AM By: Worthy Keeler PA-C Entered By: Worthy Keeler on 01/12/2022 10:27:28

## 2022-01-12 NOTE — Progress Notes (Signed)
DEMITRIS, POKORNY ([id number]) Visit Report for 01/12/2022 Arrival Information Details Patient Name: [name], [name] Date of Service: 01/12/2022 10:00 AM Medical Record Number: [id number] Patient Account Number: [id number] Date of Birth/Sex: [birth year] (70 y.o. M) Treating RN: Levora Dredge Primary Care Jullian Previti: Barney Drain Other Clinician: Referring Maryann Mccall: Barney Drain Treating Recia Sons/Extender: Skipper Cliche in Treatment: 4 Visit Information History Since Last Visit Added or deleted any medications: No Patient Arrived: Wheel Chair Any new allergies or adverse reactions: No Arrival Time: 10:04 Had a fall or experienced change in No Accompanied By: self activities of daily living that may affect Transfer Assistance: EasyPivot Patient Lift risk of falls: Patient Identification Verified: Yes Hospitalized since last visit: No Secondary Verification Process Completed: Yes Has Dressing in Place as Prescribed: No Patient Requires Transmission-Based No Pain Present Now: Yes Precautions: Patient Has Alerts: Yes Patient Alerts: R. ABI.91 TBI .76 12/02/20 L ABI 1.18 TBI.73 12/02/20 PACE Electronic Signature(s) Signed: 01/12/2022 11:28:06 AM By: Donnamarie Poag Entered By: Donnamarie Poag on 01/12/2022 11:28:06 [name] ([id number]) -------------------------------------------------------------------------------- Clinic Level of Care Assessment Details Patient Name: [name] Date of Service: 01/12/2022 10:00 AM Medical Record Number: [id number] Patient Account Number: [id number] Date of Birth/Sex: 08-15-1951 (70 y.o. M) Treating RN: Levora Dredge Primary Care Logyn Dedominicis: Barney Drain Other Clinician: Referring Cainen Burnham: Barney Drain Treating Sharaya Boruff/Extender: Skipper Cliche in Treatment: 4 Clinic Level of Care Assessment Items TOOL 1 Quantity Score []  - Use when EandM and Procedure is performed on INITIAL visit 0 ASSESSMENTS - Nursing  Assessment / Reassessment []  - General Physical Exam (combine w/ comprehensive assessment (listed just below) when performed on new 0 pt. evals) []  - 0 Comprehensive Assessment (HX, ROS, Risk Assessments, Wounds Hx, etc.) ASSESSMENTS - Wound and Skin Assessment / Reassessment []  - Dermatologic / Skin Assessment (not related to wound area) 0 ASSESSMENTS - Ostomy and/or Continence Assessment and Care []  - Incontinence Assessment and Management 0 []  - 0 Ostomy Care Assessment and Management (repouching, etc.) PROCESS - Coordination of Care []  - Simple Patient / Family Education for ongoing care 0 []  - 0 Complex (extensive) Patient / Family Education for ongoing care []  - 0 Staff obtains Programmer, systems, Records, Test Results / Process Orders []  - 0 Staff telephones HHA, Nursing Homes / Clarify orders / etc []  - 0 Routine Transfer to another Facility (non-emergent condition) []  - 0 Routine Hospital Admission (non-emergent condition) []  - 0 New Admissions / Biomedical engineer / Ordering NPWT, Apligraf, etc. []  - 0 Emergency Hospital Admission (emergent condition) PROCESS - Special Needs []  - Pediatric / Minor Patient Management 0 []  - 0 Isolation Patient Management []  - 0 Hearing / Language / Visual special needs []  - 0 Assessment of Community assistance (transportation, D/C planning, etc.) []  - 0 Additional assistance / Altered mentation []  - 0 Support Surface(s) Assessment (bed, cushion, seat, etc.) INTERVENTIONS - Miscellaneous []  - External ear exam 0 []  - 0 Patient Transfer (multiple staff / Civil Service fast streamer / Similar devices) []  - 0 Simple Staple / Suture removal (25 or less) []  - 0 Complex Staple / Suture removal (26 or more) []  - 0 Hypo/Hyperglycemic Management (do not check if billed separately) []  - 0 Ankle / Brachial Index (ABI) - do not check if billed separately Has the patient been seen at the hospital within the last three years: Yes Total Score: 0 Level Of  Care: ____ [name] ([id number]) Electronic Signature(s) Signed: 01/12/2022 4:04:42 PM By: Levora Dredge Entered By: Levora Dredge on  01/12/2022 10:24:10 [name], [name] ([id number]) -------------------------------------------------------------------------------- Encounter Discharge Information Details Patient Name: [name], [name] Date of Service: 01/12/2022 10:00 AM Medical Record Number: [id number] Patient Account Number: [id number] Date of Birth/Sex: 05-04-1951 (70 y.o. M) Treating RN: Levora Dredge Primary Care Shabazz Mckey: Barney Drain Other Clinician: Referring Krystalle Pilkington: Barney Drain Treating Victory Dresden/Extender: Skipper Cliche in Treatment: 4 Encounter Discharge Information Items Post Procedure Vitals Discharge Condition: Stable Temperature (F): 98 Ambulatory Status: Wheelchair Pulse (bpm): 68 Discharge Destination: [place] Respiratory Rate (breaths/min): 18 Telephoned: No Blood Pressure (mmHg): 119/68 Orders Sent: Yes Transportation: Other Accompanied By: self Schedule Follow-up Appointment: Yes Clinical Summary of Care: Electronic Signature(s) Signed: 01/12/2022 12:35:56 PM By: Levora Dredge Entered By: Levora Dredge on 01/12/2022 12:35:56 [name] ([id number]) -------------------------------------------------------------------------------- Lower Extremity Assessment Details Patient Name: [name] Date of Service: 01/12/2022 10:00 AM Medical Record Number: [id number] Patient Account Number: [id number] Date of Birth/Sex: 1951/04/17 (70 y.o. M) Treating RN: Levora Dredge Primary Care Asal Teas: Barney Drain Other Clinician: Referring Cande Mastropietro: Barney Drain Treating Lijah Bourque/Extender: Jeri Cos Weeks in Treatment: 4 Edema Assessment Assessed: [Left: No] [Right: No] Edema: [Left: Ye] [Right: s] Calf Left: Right: Point of Measurement: 35 cm From Medial Instep 35 cm Ankle Left: Right: Point of  Measurement: 10 cm From Medial Instep 25 cm Vascular Assessment Pulses: Dorsalis Pedis Palpable: [Right:Yes] Posterior Tibial Palpable: [Right:Yes] Electronic Signature(s) Signed: 01/12/2022 4:04:42 PM By: Levora Dredge Entered By: Levora Dredge on 01/12/2022 10:14:41 [name] ([id number]) -------------------------------------------------------------------------------- Multi Wound Chart Details Patient Name: [name] Date of Service: 01/12/2022 10:00 AM Medical Record Number: [id number] Patient Account Number: [id number] Date of Birth/Sex: 04/08/1951 (70 y.o. M) Treating RN: Levora Dredge Primary Care Leonia Heatherly: Barney Drain Other Clinician: Referring Shakyla Nolley: Barney Drain Treating Vaishnav Demartin/Extender: Skipper Cliche in Treatment: 4 Vital Signs Height(in): 67 Pulse(bpm): 68 Weight(lbs): 180 Blood Pressure(mmHg): 119/68 Body Mass Index(BMI): 28.2 Temperature(F): 98 Respiratory Rate(breaths/min): 18 Photos: [N/A:N/A] Wound Location: Right, Medial Lower Leg N/A N/A Wounding Event: Gradually Appeared N/A N/A Primary Etiology: Venous Leg Ulcer N/A N/A Comorbid History: Asthma, Hypertension, History of N/A N/A pressure wounds, Neuropathy Date Acquired: 10/20/2021 N/A N/A Weeks of Treatment: 4 N/A N/A Wound Status: Open N/A N/A Wound Recurrence: No N/A N/A Measurements L x W x D (cm) 2.8x1.8x0.1 N/A N/A Area (cm) : 3.958 N/A N/A Volume (cm) : 0.396 N/A N/A % Reduction in Area: 64.00% N/A N/A % Reduction in Volume: 64.00% N/A N/A Classification: Full Thickness Without Exposed N/A N/A Support Structures Exudate Amount: Medium N/A N/A Exudate Type: Serosanguineous N/A N/A Exudate Color: red, brown N/A N/A Wound Margin: Flat and Intact N/A N/A Granulation Amount: Small (1-33%) N/A N/A Granulation Quality: Pink, Pale, Hyper-granulation N/A N/A Necrotic Amount: Large (67-100%) N/A N/A Exposed Structures: Fat Layer (Subcutaneous Tissue): N/A  N/A Yes Fascia: No Tendon: No Muscle: No Joint: No Bone: No Epithelialization: None N/A N/A Assessment Notes: surrounding skin red in appearance N/A N/A Treatment Notes Electronic Signature(s) Signed: 01/12/2022 4:04:42 PM By: Levora Dredge Entered By: Levora Dredge on 01/12/2022 10:18:29 [name] ([id number]) [name] ([id number]) -------------------------------------------------------------------------------- Multi-Disciplinary Care Plan Details Patient Name: [name] Date of Service: 01/12/2022 10:00 AM Medical Record Number: [id number] Patient Account Number: [id number] Date of Birth/Sex: [birth year] (70 y.o. M) Treating RN: Levora Dredge Primary Care Camiyah Friberg: Barney Drain Other Clinician: Referring Tashika Goodin: Barney Drain Treating Zaidyn Claire/Extender: Skipper Cliche in Treatment: 4 Active Inactive Wound/Skin Impairment Nursing Diagnoses: Knowledge deficit related to ulceration/compromised skin integrity Goals: Patient/caregiver will verbalize  understanding of skin care regimen Date Initiated: 12/09/2021 Target Resolution Date: 01/09/2022 Goal Status: Active Ulcer/skin breakdown will have a volume reduction of 30% by week 4 Date Initiated: 12/09/2021 Target Resolution Date: 02/06/2022 Goal Status: Active Ulcer/skin breakdown will have a volume reduction of 50% by week 8 Date Initiated: 12/09/2021 Target Resolution Date: 03/09/2022 Goal Status: Active Ulcer/skin breakdown will have a volume reduction of 80% by week 12 Date Initiated: 12/09/2021 Target Resolution Date: 04/08/2022 Goal Status: Active Ulcer/skin breakdown will heal within 14 weeks Date Initiated: 12/09/2021 Target Resolution Date: 05/09/2022 Goal Status: Active Interventions: Assess patient/caregiver ability to obtain necessary supplies Assess patient/caregiver ability to perform ulcer/skin care regimen upon admission and as needed Assess ulceration(s) every  visit Notes: Electronic Signature(s) Signed: 01/12/2022 4:04:42 PM By: Levora Dredge Entered By: Levora Dredge on 01/12/2022 10:18:19 [name] ([id number]) -------------------------------------------------------------------------------- Pain Assessment Details Patient Name: [name] Date of Service: 01/12/2022 10:00 AM Medical Record Number: [id number] Patient Account Number: [id number] Date of Birth/Sex: [birth year] (71 y.o. M) Treating RN: Levora Dredge Primary Care Leotta Weingarten: Barney Drain Other Clinician: Referring Lucyle Alumbaugh: Barney Drain Treating Zaquan Duffner/Extender: Skipper Cliche in Treatment: 4 Active Problems Location of Pain Severity and Description of Pain Patient Has Paino Yes Site Locations Rate the pain. Current Pain Level: 8 Pain Management and Medication Current Pain Management: Notes pt states pain in right leg, hand and head Electronic Signature(s) Signed: 01/12/2022 4:04:42 PM By: Levora Dredge Entered By: Levora Dredge on 01/12/2022 10:07:39 [name] ([id number]) -------------------------------------------------------------------------------- Patient/Caregiver Education Details Patient Name: [name] Date of Service: 01/12/2022 10:00 AM Medical Record Number: [id number] Patient Account Number: [id number] Date of Birth/Gender: Sep 03, 1951 (70 y.o. M) Treating RN: Levora Dredge Primary Care Physician: Barney Drain Other Clinician: Referring Physician: Barney Drain Treating Physician/Extender: Skipper Cliche in Treatment: 4 Education Assessment Education Provided To: Patient Education Topics Provided Wound/Skin Impairment: Handouts: Caring for Your Ulcer Methods: Explain/Verbal Responses: State content correctly Electronic Signature(s) Signed: 01/12/2022 4:04:42 PM By: Levora Dredge Entered By: Levora Dredge on 01/12/2022 12:34:48 [name]  ([id number]) -------------------------------------------------------------------------------- Wound Assessment Details Patient Name: [name] Date of Service: 01/12/2022 10:00 AM Medical Record Number: [id number] Patient Account Number: [id number] Date of Birth/Sex: [birth year] (70 y.o. M) Treating RN: Levora Dredge Primary Care Nastasha Reising: Barney Drain Other Clinician: Referring Amorina Doerr: Barney Drain Treating Tyaisha Cullom/Extender: Jeri Cos Weeks in Treatment: 4 Wound Status Wound Number: 3 Primary Venous Leg Ulcer Etiology: Wound Location: Right, Medial Lower Leg Wound Status: Open Wounding Event: Gradually Appeared Comorbid Asthma, Hypertension, History of pressure wounds, Date Acquired: 10/20/2021 History: Neuropathy Weeks Of Treatment: 4 Clustered Wound: No Photos Wound Measurements Length: (cm) 1 Width: (cm) 0.8 Depth: (cm) 0.1 Area: (cm) 0.628 Volume: (cm) 0.063 % Reduction in Area: 94.3% % Reduction in Volume: 94.3% Epithelialization: None Tunneling: No Undermining: No Wound Description Classification: Full Thickness Without Exposed Support Structu Wound Margin: Flat and Intact Exudate Amount: Medium Exudate Type: Serosanguineous Exudate Color: red, brown res Foul Odor After Cleansing: No Slough/Fibrino Yes Wound Bed Granulation Amount: Small (1-33%) Exposed Structure Granulation Quality: Pink, Pale, Hyper-granulation Fascia Exposed: No Necrotic Amount: Large (67-100%) Fat Layer (Subcutaneous Tissue) Exposed: Yes Tendon Exposed: No Muscle Exposed: No Joint Exposed: No Bone Exposed: No Assessment Notes surrounding skin red in appearance Treatment Notes Wound #3 (Lower Leg) Wound Laterality: Right, Medial Cleanser Normal Saline [name], [name] ([id number]) Discharge Instruction: Wash your hands with soap and water. Remove old dressing, discard into plastic bag and place into trash. Cleanse the wound  with Normal Saline prior to applying  a clean dressing using gauze sponges, not tissues or cotton balls. Do not scrub or use excessive force. Pat dry using gauze sponges, not tissue or cotton balls. Peri-Wound Care Topical Triamcinolone Acetonide Cream, 0.1%, 15 (g) tube Discharge Instruction: apply red areas to peri wound Primary Dressing Hydrofera Blue Ready Transfer Foam, 4x5 (in/in) Discharge Instruction: Apply Hydrofera Blue Ready to wound bed as directed Secondary Dressing Secured With Conforming Stretch Gauze Bandage 4x75 (in/in) Discharge Instruction: Apply as directed Tubigrip Size E, 3.5x10 (in/yds) Discharge Instruction: Apply 3 Tubigrip E 3-finger-widths below knee to base of toes to secure dressing and/or for swelling. Compression Wrap Compression Stockings Add-Ons Electronic Signature(s) Signed: 01/12/2022 4:04:42 PM By: Levora Dredge Entered By: Levora Dredge on 01/12/2022 10:20:30 [name] ([id number]) -------------------------------------------------------------------------------- Vitals Details Patient Name: [name] Date of Service: 01/12/2022 10:00 AM Medical Record Number: [id number] Patient Account Number: [id number] Date of Birth/Sex: 1951/04/23 (70 y.o. M) Treating RN: Levora Dredge Primary Care Bianca Vester: Barney Drain Other Clinician: Referring Amerika Nourse: Barney Drain Treating Zelta Enfield/Extender: Skipper Cliche in Treatment: 4 Vital Signs Time Taken: 10:05 Temperature (F): 98 Height (in): 67 Pulse (bpm): 68 Weight (lbs): 180 Respiratory Rate (breaths/min): 18 Body Mass Index (BMI): 28.2 Blood Pressure (mmHg): 119/68 Reference Range: 80 - 120 mg / dl Electronic Signature(s) Signed: 01/12/2022 4:04:42 PM By: Levora Dredge Entered By: Levora Dredge on 01/12/2022 10:07:09

## 2022-01-24 ENCOUNTER — Ambulatory Visit: Payer: Medicare (Managed Care) | Admitting: Physician Assistant

## 2022-01-27 ENCOUNTER — Other Ambulatory Visit: Payer: Self-pay

## 2022-01-27 ENCOUNTER — Encounter: Payer: Medicare (Managed Care) | Attending: Internal Medicine | Admitting: Internal Medicine

## 2022-01-27 DIAGNOSIS — I87331 Chronic venous hypertension (idiopathic) with ulcer and inflammation of right lower extremity: Secondary | ICD-10-CM | POA: Diagnosis present

## 2022-01-27 DIAGNOSIS — L97818 Non-pressure chronic ulcer of other part of right lower leg with other specified severity: Secondary | ICD-10-CM | POA: Diagnosis not present

## 2022-01-27 NOTE — Progress Notes (Signed)
PASCO, MARCHITTO ([id number]) Visit Report for 01/27/2022 Arrival Information Details Patient Name: Perry, Cuevas Date of Service: 01/27/2022 11:30 AM Medical Record Number: [id number] Patient Account Number: [id number] Date of Birth/Sex: October 22, 1951 (70 y.o. M) Treating RN: Levora Dredge Primary Care Emberlin Verner: Barney Drain Other Clinician: Referring Jaicee Michelotti: Barney Drain Treating Irelyn Perfecto/Extender: Tito Dine in Treatment: 7 Visit Information History Since Last Visit Added or deleted any medications: No Patient Arrived: Wheel Chair Any new allergies or adverse reactions: No Arrival Time: 11:42 Had a fall or experienced change in No Accompanied By: self activities of daily living that may affect Transfer Assistance: None risk of falls: Patient Identification Verified: Yes Hospitalized since last visit: No Secondary Verification Process Completed: Yes Has Dressing in Place as Prescribed: Yes Patient Requires Transmission-Based No Pain Present Now: No Precautions: Patient Has Alerts: Yes Patient Alerts: R. ABI.91 TBI .76 12/02/20 L ABI 1.18 TBI.73 12/02/20 [facility] Electronic Signature(s) Signed: 01/27/2022 12:51:11 PM By: Levora Dredge Entered By: Levora Dredge on 01/27/2022 11:42:45 [name] ([id number]) -------------------------------------------------------------------------------- Clinic Level of Care Assessment Details Patient Name: [name] Date of Service: 01/27/2022 11:30 AM Medical Record Number: [id number] Patient Account Number: [id number] Date of Birth/Sex: 01/31/1951 (70 y.o. M) Treating RN: Levora Dredge Primary Care Tiamarie Furnari: Barney Drain Other Clinician: Referring Davanna He: Barney Drain Treating Meggan Dhaliwal/Extender: Tito Dine in Treatment: 7 Clinic Level of Care Assessment Items TOOL 1 Quantity Score '[]'$  - Use when EandM and Procedure is performed on INITIAL visit 0 ASSESSMENTS - Nursing  Assessment / Reassessment '[]'$  - General Physical Exam (combine w/ comprehensive assessment (listed just below) when performed on new 0 pt. evals) '[]'$  - 0 Comprehensive Assessment (HX, ROS, Risk Assessments, Wounds Hx, etc.) ASSESSMENTS - Wound and Skin Assessment / Reassessment '[]'$  - Dermatologic / Skin Assessment (not related to wound area) 0 ASSESSMENTS - Ostomy and/or Continence Assessment and Care '[]'$  - Incontinence Assessment and Management 0 '[]'$  - 0 Ostomy Care Assessment and Management (repouching, etc.) PROCESS - Coordination of Care '[]'$  - Simple Patient / Family Education for ongoing care 0 '[]'$  - 0 Complex (extensive) Patient / Family Education for ongoing care '[]'$  - 0 Staff obtains Programmer, systems, Records, Test Results / Process Orders '[]'$  - 0 Staff telephones HHA, Nursing Homes / Clarify orders / etc '[]'$  - 0 Routine Transfer to another Facility (non-emergent condition) '[]'$  - 0 Routine Hospital Admission (non-emergent condition) '[]'$  - 0 New Admissions / Biomedical engineer / Ordering NPWT, Apligraf, etc. '[]'$  - 0 Emergency Hospital Admission (emergent condition) PROCESS - Special Needs '[]'$  - Pediatric / Minor Patient Management 0 '[]'$  - 0 Isolation Patient Management '[]'$  - 0 Hearing / Language / Visual special needs '[]'$  - 0 Assessment of Community assistance (transportation, D/C planning, etc.) '[]'$  - 0 Additional assistance / Altered mentation '[]'$  - 0 Support Surface(s) Assessment (bed, cushion, seat, etc.) INTERVENTIONS - Miscellaneous '[]'$  - External ear exam 0 '[]'$  - 0 Patient Transfer (multiple staff / Civil Service fast streamer / Similar devices) '[]'$  - 0 Simple Staple / Suture removal (25 or less) '[]'$  - 0 Complex Staple / Suture removal (26 or more) '[]'$  - 0 Hypo/Hyperglycemic Management (do not check if billed separately) '[]'$  - 0 Ankle / Brachial Index (ABI) - do not check if billed separately Has the patient been seen at the hospital within the last three years: Yes Total Score: 0 Level Of  Care: ____ [name] ([id number]) Electronic Signature(s) Signed: 01/27/2022 12:51:11 PM By: Levora Dredge Entered By: Levora Dredge on  01/27/2022 12:17:26 [name] ([id number]) -------------------------------------------------------------------------------- Encounter Discharge Information Details Patient Name: Perry, Cuevas Date of Service: 01/27/2022 11:30 AM Medical Record Number: [id number] Patient Account Number: [id number] Date of Birth/Sex: 08/15/1951 (70 y.o. M) Treating RN: Levora Dredge Primary Care Jannetta Massey: Barney Drain Other Clinician: Referring Jaqualyn Juday: Barney Drain Treating Kylar Speelman/Extender: Tito Dine in Treatment: 7 Encounter Discharge Information Items Post Procedure Vitals Discharge Condition: Stable Temperature (F): 97.6 Ambulatory Status: Wheelchair Pulse (bpm): 59 Discharge Destination: [place] Respiratory Rate (breaths/min): 18 Orders Sent: Yes Blood Pressure (mmHg): 128/74 Transportation: Other Accompanied By: self Schedule Follow-up Appointment: Yes Clinical Summary of Care: Electronic Signature(s) Signed: 01/27/2022 12:51:11 PM By: Levora Dredge Entered By: Levora Dredge on 01/27/2022 12:19:24 [name] ([id number]) -------------------------------------------------------------------------------- Lower Extremity Assessment Details Patient Name: [name] Date of Service: 01/27/2022 11:30 AM Medical Record Number: [id number] Patient Account Number: [id number] Date of Birth/Sex: 1951/10/08 (70 y.o. M) Treating RN: Levora Dredge Primary Care Lennon Boutwell: Barney Drain Other Clinician: Referring Darcie Mellone: Barney Drain Treating Celina Shiley/Extender: Tito Dine in Treatment: 7 Edema Assessment Assessed: [Left: No] [Right: No] Edema: [Left: N] [Right: o] Calf Left: Right: Point of Measurement: From Medial Instep 32.5 cm Ankle Left: Right: Point of  Measurement: From Medial Instep 26 cm Vascular Assessment Pulses: Dorsalis Pedis Palpable: [Right:Yes] Electronic Signature(s) Signed: 01/27/2022 12:51:11 PM By: Levora Dredge Entered By: Levora Dredge on 01/27/2022 11:52:13 [name] ([id number]) -------------------------------------------------------------------------------- Multi Wound Chart Details Patient Name: [name] Date of Service: 01/27/2022 11:30 AM Medical Record Number: [id number] Patient Account Number: [id number] Date of Birth/Sex: 10-24-1951 (70 y.o. M) Treating RN: Levora Dredge Primary Care Timotheus Salm: Barney Drain Other Clinician: Referring Rupinder Livingston: Barney Drain Treating Jaspreet Bodner/Extender: Tito Dine in Treatment: 7 Vital Signs Height(in): 75 Pulse(bpm): 74 Weight(lbs): 180 Blood Pressure(mmHg): 128/74 Body Mass Index(BMI): 28.2 Temperature(F): 97.6 Respiratory Rate(breaths/min): 18 Photos: [N/A:N/A] Wound Location: Right, Medial Lower Leg N/A N/A Wounding Event: Gradually Appeared N/A N/A Primary Etiology: Venous Leg Ulcer N/A N/A Comorbid History: Asthma, Hypertension, History of N/A N/A pressure wounds, Neuropathy Date Acquired: 10/20/2021 N/A N/A Weeks of Treatment: 7 N/A N/A Wound Status: Open N/A N/A Wound Recurrence: No N/A N/A Measurements L x W x D (cm) 0.6x0.5x0.2 N/A N/A Area (cm) : 0.236 N/A N/A Volume (cm) : 0.047 N/A N/A % Reduction in Area: 97.90% N/A N/A % Reduction in Volume: 95.70% N/A N/A Classification: Full Thickness Without Exposed N/A N/A Support Structures Exudate Amount: Medium N/A N/A Exudate Type: Serosanguineous N/A N/A Exudate Color: red, brown N/A N/A Wound Margin: Flat and Intact N/A N/A Granulation Amount: Small (1-33%) N/A N/A Granulation Quality: Pink, Pale, Hyper-granulation N/A N/A Necrotic Amount: Large (67-100%) N/A N/A Necrotic Tissue: Eschar, Adherent Slough N/A N/A Exposed Structures: Fat Layer (Subcutaneous  Tissue): N/A N/A Yes Fascia: No Tendon: No Muscle: No Joint: No Bone: No Epithelialization: None N/A N/A Debridement: Debridement - Excisional N/A N/A Pre-procedure Verification/Time 12:03 N/A N/A Out Taken: Tissue Debrided: Subcutaneous N/A N/A Level: Skin/Subcutaneous Tissue N/A N/A Debridement Area (sq cm): 0.3 N/A N/A Instrument: Curette N/A N/A Bleeding: Moderate N/A N/A Hemostasis Achieved: Pressure N/A N/A Procedure was tolerated well N/A N/A [name], [name] ([id number]) Debridement Treatment Response: Post Debridement 1x1.5x0.1 N/A N/A Measurements L x W x D (cm) Post Debridement Volume: 0.118 N/A N/A (cm) Procedures Performed: Debridement N/A N/A Treatment Notes Electronic Signature(s) Signed: 01/27/2022 12:51:11 PM By: Levora Dredge Entered By: Levora Dredge on 01/27/2022 12:17:00 [name] ([id number]) -------------------------------------------------------------------------------- [place]  Details Patient Name: Perry, Cuevas Date of Service: 01/27/2022 11:30 AM Medical Record Number: [id number] Patient Account Number: [id number] Date of Birth/Sex: [birth year] (70 y.o. M) Treating RN: Levora Dredge Primary Care Honor Fairbank: Barney Drain Other Clinician: Referring Keylin Ferryman: Barney Drain Treating Audry Pecina/Extender: Tito Dine in Treatment: 7 Active Inactive Wound/Skin Impairment Nursing Diagnoses: Knowledge deficit related to ulceration/compromised skin integrity Goals: Patient/caregiver will verbalize understanding of skin care regimen Date Initiated: 12/09/2021 Target Resolution Date: 01/09/2022 Goal Status: Active Ulcer/skin breakdown will have a volume reduction of 30% by week 4 Date Initiated: 12/09/2021 Target Resolution Date: 02/06/2022 Goal Status: Active Ulcer/skin breakdown will have a volume reduction of 50% by week 8 Date Initiated: 12/09/2021 Target Resolution Date: 03/09/2022 Goal Status:  Active Ulcer/skin breakdown will have a volume reduction of 80% by week 12 Date Initiated: 12/09/2021 Target Resolution Date: 04/08/2022 Goal Status: Active Ulcer/skin breakdown will heal within 14 weeks Date Initiated: 12/09/2021 Target Resolution Date: 05/09/2022 Goal Status: Active Interventions: Assess patient/caregiver ability to obtain necessary supplies Assess patient/caregiver ability to perform ulcer/skin care regimen upon admission and as needed Assess ulceration(s) every visit Notes: Electronic Signature(s) Signed: 01/27/2022 12:51:11 PM By: Levora Dredge Entered By: Levora Dredge on 01/27/2022 12:16:50 [name] ([id number]) -------------------------------------------------------------------------------- Pain Assessment Details Patient Name: [name] Date of Service: 01/27/2022 11:30 AM Medical Record Number: [id number] Patient Account Number: [id number] Date of Birth/Sex: [birth year] (70 y.o. M) Treating RN: Levora Dredge Primary Care Heather Mckendree: Barney Drain Other Clinician: Referring Shatia Sindoni: Barney Drain Treating Chibuikem Thang/Extender: Tito Dine in Treatment: 7 Active Problems Location of Pain Severity and Description of Pain Patient Has Paino No Site Locations Rate the pain. Current Pain Level: 0 Pain Management and Medication Current Pain Management: Electronic Signature(s) Signed: 01/27/2022 12:51:11 PM By: Levora Dredge Entered By: Levora Dredge on 01/27/2022 11:44:27 [name] ([id number]) -------------------------------------------------------------------------------- Patient/Caregiver Education Details Patient Name: [name] Date of Service: 01/27/2022 11:30 AM Medical Record Number: [id number] Patient Account Number: [id number] Date of Birth/Gender: 1951-06-27 (70 y.o. M) Treating RN: Levora Dredge Primary Care Physician: Barney Drain Other Clinician: Referring Physician: Barney Drain Treating Physician/Extender: Tito Dine in Treatment: 7 Education Assessment Education Provided To: Patient and Caregiver PACE Education Topics Provided Wound/Skin Impairment: Handouts: Caring for Your Ulcer Methods: Explain/Verbal Responses: State content correctly Electronic Signature(s) Signed: 01/27/2022 12:51:11 PM By: Levora Dredge Entered By: Levora Dredge on 01/27/2022 12:17:55 [name] ([id number]) -------------------------------------------------------------------------------- Wound Assessment Details Patient Name: [name] Date of Service: 01/27/2022 11:30 AM Medical Record Number: [id number] Patient Account Number: [id number] Date of Birth/Sex: Sep 25, 1951 (70 y.o. M) Treating RN: Levora Dredge Primary Care Tzion Wedel: Barney Drain Other Clinician: Referring Maedell Hedger: Barney Drain Treating Tyjai Matuszak/Extender: Tito Dine in Treatment: 7 Wound Status Wound Number: 3 Primary Venous Leg Ulcer Etiology: Wound Location: Right, Medial Lower Leg Wound Status: Open Wounding Event: Gradually Appeared Comorbid Asthma, Hypertension, History of pressure wounds, Date Acquired: 10/20/2021 History: Neuropathy Weeks Of Treatment: 7 Clustered Wound: No Photos Wound Measurements Length: (cm) 0.6 Width: (cm) 0.5 Depth: (cm) 0.2 Area: (cm) 0.236 Volume: (cm) 0.047 % Reduction in Area: 97.9% % Reduction in Volume: 95.7% Epithelialization: None Tunneling: No Undermining: No Wound Description Classification: Full Thickness Without Exposed Support Structures Wound Margin: Flat and Intact Exudate Amount: Medium Exudate Type: Serosanguineous Exudate Color: red, brown Foul Odor After Cleansing: No Slough/Fibrino Yes Wound Bed Granulation Amount: Small (1-33%) Exposed Structure Granulation Quality: Pink, Pale, Hyper-granulation Fascia Exposed: No Necrotic Amount: Large (67-100%) Fat Layer (  Subcutaneous Tissue)  Exposed: Yes Necrotic Quality: Eschar, Adherent Slough Tendon Exposed: No Muscle Exposed: No Joint Exposed: No Bone Exposed: No Treatment Notes Wound #3 (Lower Leg) Wound Laterality: Right, Medial Cleanser Normal Saline Discharge Instruction: Wash your hands with soap and water. Remove old dressing, discard into plastic bag and place into trash. Cleanse the wound with Normal Saline prior to applying a clean dressing using gauze sponges, not tissues or cotton balls. Do not scrub or use excessive force. Pat dry using gauze sponges, not tissue or cotton balls. [name], [name] ([id number]) Peri-Wound Care Topical Triamcinolone Acetonide Cream, 0.1%, 15 (g) tube Discharge Instruction: apply red areas to peri wound Primary Dressing Hydrofera Blue Ready Transfer Foam, 4x5 (in/in) Discharge Instruction: Apply Hydrofera Blue Ready to wound bed as directed Secondary Dressing Secured With Conform 4'' - Conforming Stretch Gauze Bandage 4x75 (in/in) Discharge Instruction: Apply as directed Tubigrip Size E, 3.5x10 (in/yds) Discharge Instruction: Apply 3 Tubigrip E 3-finger-widths below knee to base of toes to secure dressing and/or for swelling. Compression Wrap Compression Stockings Add-Ons Electronic Signature(s) Signed: 01/27/2022 12:51:11 PM By: Levora Dredge Entered By: Levora Dredge on 01/27/2022 11:51:28 [name] ([id number]) -------------------------------------------------------------------------------- Vitals Details Patient Name: [name] Date of Service: 01/27/2022 11:30 AM Medical Record Number: [id number] Patient Account Number: [id number] Date of Birth/Sex: 24-May-1951 (70 y.o. M) Treating RN: Levora Dredge Primary Care Lavell Supple: Barney Drain Other Clinician: Referring Evett Kassa: Barney Drain Treating Rowyn Spilde/Extender: Tito Dine in Treatment: 7 Vital Signs Time Taken: 11:42 Temperature (F): 97.6 Height (in): 67 Pulse (bpm):  59 Weight (lbs): 180 Respiratory Rate (breaths/min): 18 Body Mass Index (BMI): 28.2 Blood Pressure (mmHg): 128/74 Reference Range: 80 - 120 mg / dl Electronic Signature(s) Signed: 01/27/2022 12:51:11 PM By: Levora Dredge Entered By: Levora Dredge on 01/27/2022 11:44:13

## 2022-01-27 NOTE — Progress Notes (Signed)
BLESSING, OZGA ([id number]) Visit Report for 01/27/2022 Debridement Details Patient Name: [name], [name] Date of Service: 01/27/2022 11:30 AM Medical Record Number: [id number] Patient Account Number: [id number] Date of Birth/Sex: 12-Jul-1951 (71 y.o. M) Treating RN: Levora Dredge Primary Care Provider: Barney Drain Other Clinician: Referring Provider: Barney Drain Treating Provider/Extender: Tito Dine in Treatment: 7 Debridement Performed for Wound #3 Right,Medial Lower Leg Assessment: Performed By: Physician Ricard Dillon, MD Debridement Type: Debridement Severity of Tissue Pre Debridement: Fat layer exposed Level of Consciousness (Pre- Awake and Alert procedure): Pre-procedure Verification/Time Out Yes - 12:03 Taken: Total Area Debrided (L x W): 0.6 (cm) x 0.5 (cm) = 0.3 (cm) Tissue and other material Viable, Non-Viable, Subcutaneous, Skin: Dermis , Skin: Epidermis debrided: Level: Skin/Subcutaneous Tissue Debridement Description: Excisional Instrument: Curette Bleeding: Moderate Hemostasis Achieved: Pressure Response to Treatment: Procedure was tolerated well Level of Consciousness (Post- Awake and Alert procedure): Post Debridement Measurements of Total Wound Length: (cm) 1 Width: (cm) 1.5 Depth: (cm) 0.1 Volume: (cm) 0.118 Character of Wound/Ulcer Post Debridement: Stable Severity of Tissue Post Debridement: Fat layer exposed Post Procedure Diagnosis Same as Pre-procedure Electronic Signature(s) Signed: 01/27/2022 12:51:11 PM By: Levora Dredge Signed: 01/27/2022 1:04:01 PM By: Linton Ham MD Entered By: Levora Dredge on 01/27/2022 12:06:47 [name] ([id number]) -------------------------------------------------------------------------------- HPI Details Patient Name: [name] Date of Service: 01/27/2022 11:30 AM Medical Record Number: [id number] Patient Account Number: [id number] Date of Birth/Sex:  01/19/1951 (71 y.o. M) Treating RN: Levora Dredge Primary Care Provider: Barney Drain Other Clinician: Referring Provider: Barney Drain Treating Provider/Extender: Tito Dine in Treatment: 7 History of Present Illness HPI Description: 12/21/2020 upon evaluation today patient appears to be doing somewhat poorly in regard to his right lower extremity. He does have some erythema and is also having issues with quite a bit of drainage currently from a wound that he tells me has been present since about 2 months ago when he initially had an injury that led to this wound beginning. He does have chronic venous stasis and is secondary to this I think it has been more difficult to heal this wound than normal. With that being said the patient tells me that this tends to drain a lot down onto his feet. He is also had some issues with fungal infection he is currently on miconazole powder for that. Fortunately there is no signs of active infection systemically at this time. No fevers, chills, nausea, vomiting, or diarrhea. He does have pain but again this is more to the posterior aspect not really more lateral or anterior. There is evidence that he may have some local infection due to erythema and warmth surrounding the wound post debridement I do want to obtain a wound culture to evaluate and see if there is anything indeed of concern here. He did have arterial studies which revealed that he had a ABI of 0.91 on the right with a TBI of 0.76 in the left his ABI was 1.18 with a TBI of 0.73. He has good blood flow here. In regard to his x-ray that was taken on 11/30/2020 there was no evidence of osseous abnormality. The arterial study was actually performed on 12/03/2020. The patient was hospitalized from 11/30/2020 through 12/02/2020 during that time he was on IV antibiotics including vancomycin and cefepime. 12/28/2020 upon evaluation today patient appears to be doing somewhat poorly in regard to  his legs currently is having a lot of drainage unfortunately which again he was not actually being  wrapped with a 3 layer compression wrap after this was changed at [facility]. Subsequently I think that the patient does need to have ongoing compression therapy. Right now that means that were doing the compression wrap later he will need compression stockings. Either way though I think that he cannot just have a border foam dressing on this which was what was on today that is not sufficient to catch any of the drainage and to be honest I think this can lead to infection and greater issues here for him. The wound is pretty much about circumferential at this point a weeping area included. 01/04/2021 upon evaluation today patient unfortunately is doing quite a bit worse in regard to the overall appearance of his wound from the standpoint of infection. I did see evidence of bright green drainage consistent with Pseudomonas which I discussed with the patient today as well as his representative from social services. Nonetheless I believe that we likely need to treat him for Pseudomonas right now the clindamycin he is on is not covering for this. With that being said no sharp debridement was performed today. Of note the patient's Hydrofera Blue dressing was not the ready transfer version and unfortunately did cause this did not drain quite as well as we would like to. Nonetheless I do feel like that the patient would benefit from continuing with the wraps changed out their [facility] I think they can definitely do a good job and his swelling is under good control the big thing here is that we need to make sure that the dressing is applied appropriately we will contact them just to confirm and make sure that they know the appropriate way to apply the [facility] that they have. 01/11/2021 upon evaluation today patient appears to be doing somewhat better in regard to the erythema on his leg at this point.  Fortunately there are no signs of active infection at this time. No fevers, chills, nausea, vomiting, or diarrhea. With that being said he has been tolerating the dressing changes without complication and very pleased in that regard. I do think that he seems to be making good progress in general which is good news. I think that the biggest issue I see right now is simply the [facility] that is being used cannot transfer through to the [place] and therefore extracting a lot more fluid against the skin which is causing some irritation as well. I think that we may be able to wrap him here and get things in a better position for him to be honest. 01/18/2021 upon evaluation today patient appears to be doing better in regard to his leg. I feel like he is definitely making improvement and overall I think that this is significantly improved compared to the previous evaluation. With that being said there is still quite a ways to go to get this to heal and it is going to take quite a bit of time. Fortunately there is no evidence of active infection at this time. No fevers, chills, nausea, vomiting, or diarrhea. 01/25/2021 upon evaluation today patient appears to be doing well with regard to his leg. I feel like he is definitely showing signs of improvement which is great news. There is no evidence of active infection at this time. No fevers, chills, nausea, vomiting, or diarrhea. 02/01/2021 upon evaluation today patient appears to be doing decently well in regard to his leg on the right as compared to previously noted. There does not appear to be any signs of  active infection at this time which is great news and overall I am pleased that his systemically. Nonetheless locally still show signs of erythema he still has several days left of his antibiotic as apparently this was not started immediately after I wrote that for him last time. Nonetheless I think that he is taking the Levaquin now and hopefully its  doing job to help clear things up. Nonetheless if he is not significantly better by next week I would likely repeat the culture to try to see if anything is changed as again he still has some erythema this week that has me concerned at this point. 02/07/2021 on evaluation today patient actually appears to be doing well with regard to his wounds in his far as size and overall appearance is concerned. He still having a tremendous amount of drainage she has now completed the Levaquin and unfortunately as opposed to improving the erythema actually seems to have worsened. Has been off the Levaquin for several days therefore I did obtain a new culture today. With that being said I nonetheless feel like that the patient is showing signs of ongoing issues with cellulitis and to be honest not only is he having issues on the right leg which is what we have been monitoring here but he now is having issues on the left leg as well unfortunately. This is even up and around the knee and again I think this is of significant concern to the point that I feel like he should go to the ER for further evaluation and treatment. 07/19/2021 upon evaluation today patient appears for reevaluation here in the clinic last time I saw him was actually March 2022. At that time I sent him to the hospital due to the fact that he had a significant infection. Subsequently upon discharge from the hospital he was sent to a skilled nursing facility and I have not seen him since that time. Fortunately there does not appear to be any evidence of active infection at this time. No fevers, chills, nausea, vomiting, or diarrhea. With that being said I am somewhat concerned about the overall appearance of the wound bed. Now that it is consolidated to a much smaller area I feel like that the wound is somewhat irregular in the way it appears. For that reason I really do think that we need to probably go ahead and see about doing a biopsy to test for  the possibility of a skin cancer here. The patient is in agreement with that plan. [name], [name] ([id number]) 08/01/2021 upon evaluation today patient is actually seen for an early appointment that he was actually supposed to be here on Thursday he comes in 3 days early due to the fact that the provider at [facility] felt like he needed to be seen sooner due to the severity of the wound. It does indeed appear to be doing significantly worse compared to the last time I saw him 2 weeks ago. In fact this is probably about 3 times the size it was then as far as the skin breakdown. There is also erythema and warmth and I am concerned about that as well. I think that the patient probably needs to go to the ER for further evaluation and treatment potentially IV antibiotics is what is can to be necessary here. READMISSION 12/09/2021 This is a 71 year old man who we know fairly well in this facility. 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 He had 2 visits in follow-up in August and the last time was sent to the ER with cellulitis of the right lower extremity. I see through looking in epic he was hospitalized once in September and once in December for cellulitis of the right lower extremity and a chronic venous ulcer. The patient is a resident of the [facility]. He had Hydrofera Blue on the wound and kerlix when he came in. He has very significant erythema more distally towards the ankle and more proximally on the medial right leg. But this is not particularly tender he is not systemically unwell. The patient had ABIs TBI's in January 2022 at 0.910.76 on the right and 1.18 then 0.73 on the left respectively. 12/30/2021 upon evaluation today patient appears to be doing well with regard to his wound. He is actually measuring significantly smaller which is great news and overall I am extremely pleased with how Mr. Rihn appears today. I do not see any signs of  active infection locally or systemically at this point which is great news. 01/12/2022 upon evaluation today patient appears to be doing well with regard to his wounds. He has been tolerating the dressing changes without complication. He came in with nothing on today as he said at the facility they said they wanted to "leave it open to air". Nonetheless that is not the ordering is not what should be done into I given order otherwise. 3/10; patient comes in today with the wound on the right lateral lower leg in the setting of chronic venous insufficiency. A large amount of eschar around the wound edge with undermining. We have been using silver alginate with Tubigrip. The patient appears to be at [facility] skilled facility which is where [facility] has contracted beds Electronic Signature(s) Signed: 01/27/2022 1:04:01 PM By: Linton Ham MD Entered By: Linton Ham on 01/27/2022 12:28:50 [name] ([id number]) -------------------------------------------------------------------------------- Physical Exam Details Patient Name: [name] Date of Service: 01/27/2022 11:30 AM Medical Record Number: [id number] Patient Account Number: [id number] Date of Birth/Sex: 1951/11/17 (71 y.o. M) Treating RN: Levora Dredge Primary Care Provider: Barney Drain Other Clinician: Referring Provider: Barney Drain Treating Provider/Extender: Tito Dine in Treatment: 7 Constitutional Sitting or standing Blood Pressure is within target range for patient.. Pulse regular and within target range for patient.Marland Kitchen Respirations regular, non- labored and within target range.. Temperature is normal and within the target range for the patient.Marland Kitchen appears in no distress. Notes Wound exam; the wound surface did not look unhealthy however there was a thick rim of callus around this entire area. I used a #5 curette to remove this and also some subcutaneous debris. The wound surface after this looked  quite healthy and I am hopeful that this will allow for final epithelialization. No evidence of surrounding infection. His edema control looked adequate Electronic Signature(s) Signed: 01/27/2022 1:04:01 PM By: Linton Ham MD Entered By: Linton Ham on 01/27/2022 12:29:51 [name] ([id number]) -------------------------------------------------------------------------------- Physician Orders Details Patient Name: [name] Date of Service: 01/27/2022 11:30 AM Medical Record Number: [id number] Patient Account Number: [id number] Date of Birth/Sex: August 15, 1951 (71 y.o. M) Treating RN: Levora Dredge Primary Care Provider: Barney Drain Other Clinician: Referring Provider: Barney Drain Treating Provider/Extender: Tito Dine in Treatment: 7 Verbal / Phone Orders: No Diagnosis Coding Follow-up Appointments o Return Appointment in 1 week. Bathing/ Shower/ Hygiene o May shower with wound dressing protected with water repellent cover or cast protector. o Other: - Tubi E placed on LLL,  ok per PA Stone Edema Control - Lymphedema / Segmental Compressive Device / Other o Elevate, Exercise Daily and Avoid Standing for Long Periods of Time. o Elevate legs to the level of the heart and pump ankles as often as possible o Elevate leg(s) parallel to the floor when sitting. Wound Treatment Wound #3 - Lower Leg Wound Laterality: Right, Medial Cleanser: Normal Saline 2 x Per Week/30 Days Discharge Instructions: Wash your hands with soap and water. Remove old dressing, discard into plastic bag and place into trash. Cleanse the wound with Normal Saline prior to applying a clean dressing using gauze sponges, not tissues or cotton balls. Do not scrub or use excessive force. Pat dry using gauze sponges, not tissue or cotton balls. Topical: Triamcinolone Acetonide Cream, 0.1%, 15 (g) tube 2 x Per Week/30 Days Discharge Instructions: apply red areas to peri  wound Primary Dressing: Hydrofera Blue Ready Transfer Foam, 4x5 (in/in) 2 x Per Week/30 Days Discharge Instructions: Apply Hydrofera Blue Ready to wound bed as directed Secured With: Conform 4'' - Conforming Stretch Gauze Bandage 4x75 (in/in) 2 x Per Week/30 Days Discharge Instructions: Apply as directed Secured With: Tubigrip Size E, 3.5x10 (in/yds) 2 x Per Week/30 Days Discharge Instructions: Apply 3 Tubigrip E 3-finger-widths below knee to base of toes to secure dressing and/or for swelling. Electronic Signature(s) Signed: 01/27/2022 12:51:11 PM By: Levora Dredge Signed: 01/27/2022 1:04:01 PM By: Linton Ham MD Entered By: Levora Dredge on 01/27/2022 12:17:18 [name] ([id number]) -------------------------------------------------------------------------------- Problem List Details Patient Name: [name] Date of Service: 01/27/2022 11:30 AM Medical Record Number: [id number] Patient Account Number: [id number] Date of Birth/Sex: 1950/12/05 (71 y.o. 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 venous hypertension (idiopathic) with ulcer and inflammation of 12/09/2021 No Yes right lower extremity L97.818 Non-pressure chronic ulcer of other part of right lower leg with other 12/09/2021 No Yes specified severity Inactive Problems Resolved Problems Electronic Signature(s) Signed: 01/27/2022 1:04:01 PM By: Linton Ham MD Entered By: Linton Ham on 01/27/2022 12:27:31 [name] ([id number]) -------------------------------------------------------------------------------- Progress Note Details Patient Name: [name] Date of Service: 01/27/2022 11:30 AM Medical Record Number: [id number] Patient Account  Number: [id number] Date of Birth/Sex: [birth year] (71 y.o. M) Treating RN: Levora Dredge Primary Care Provider: Barney Drain Other Clinician: Referring Provider: Barney Drain Treating Provider/Extender: Tito Dine in Treatment: 7 Subjective History of Present Illness (HPI) 12/21/2020 upon evaluation today patient appears to be doing somewhat poorly in regard to his right lower extremity. He does have some erythema and is also having issues with quite a bit of drainage currently from a wound that he tells me has been present since about 2 months ago when he initially had an injury that led to this wound beginning. He does have chronic venous stasis and is secondary to this I think it has been more difficult to heal this wound than normal. With that being said the patient tells me that this tends to drain a lot down onto his feet. He is also had some issues with fungal infection he is currently on miconazole powder for that. Fortunately there is no signs of active infection systemically at this time. No fevers, chills, nausea, vomiting, or diarrhea. He does have pain but again this is more to the posterior aspect not really more lateral or anterior. There is  evidence that he may have some local infection due to erythema and warmth surrounding the wound post debridement I do want to obtain a wound culture to evaluate and see if there is anything indeed of concern here. He did have arterial studies which revealed that he had a ABI of 0.91 on the right with a TBI of 0.76 in the left his ABI was 1.18 with a TBI of 0.73. He has good blood flow here. In regard to his x-ray that was taken on 11/30/2020 there was no evidence of osseous abnormality. The arterial study was actually performed on 12/03/2020. The patient was hospitalized from 11/30/2020 through 12/02/2020 during that time he was on IV antibiotics including vancomycin and cefepime. 12/28/2020 upon evaluation today patient appears to  be doing somewhat poorly in regard to his legs currently is having a lot of drainage unfortunately which again he was not actually being wrapped with a 3 layer compression wrap after this was changed at [facility]. Subsequently I think that the patient does need to have ongoing compression therapy. Right now that means that were doing the compression wrap later he will need compression stockings. Either way though I think that he cannot just have a border foam dressing on this which was what was on today that is not sufficient to catch any of the drainage and to be honest I think this can lead to infection and greater issues here for him. The wound is pretty much about circumferential at this point a weeping area included. 01/04/2021 upon evaluation today patient unfortunately is doing quite a bit worse in regard to the overall appearance of his wound from the standpoint of infection. I did see evidence of bright green drainage consistent with Pseudomonas which I discussed with the patient today as well as his representative from social services. Nonetheless I believe that we likely need to treat him for Pseudomonas right now the clindamycin he is on is not covering for this. With that being said no sharp debridement was performed today. Of note the patient's Hydrofera Blue dressing was not the ready transfer version and unfortunately did cause this did not drain quite as well as we would like to. Nonetheless I do feel like that the patient would benefit from continuing with the wraps changed out their [facility] I think they can definitely do a good job and his swelling is under good control the big thing here is that we need to make sure that the dressing is applied appropriately we will contact them just to confirm and make sure that they know the appropriate way to apply the [facility] that they have. 01/11/2021 upon evaluation today patient appears to be doing somewhat better in regard to the erythema on  his leg at this point. Fortunately there are no signs of active infection at this time. No fevers, chills, nausea, vomiting, or diarrhea. With that being said he has been tolerating the dressing changes without complication and very pleased in that regard. I do think that he seems to be making good progress in general which is good news. I think that the biggest issue I see right now is simply the [facility] that is being used cannot transfer through to the [place] and therefore extracting a lot more fluid against the skin which is causing some irritation as well. I think that we may be able to wrap him here and get things in a better position for him to be honest. 01/18/2021 upon evaluation today patient appears to  be doing better in regard to his leg. I feel like he is definitely making improvement and overall I think that this is significantly improved compared to the previous evaluation. With that being said there is still quite a ways to go to get this to heal and it is going to take quite a bit of time. Fortunately there is no evidence of active infection at this time. No fevers, chills, nausea, vomiting, or diarrhea. 01/25/2021 upon evaluation today patient appears to be doing well with regard to his leg. I feel like he is definitely showing signs of improvement which is great news. There is no evidence of active infection at this time. No fevers, chills, nausea, vomiting, or diarrhea. 02/01/2021 upon evaluation today patient appears to be doing decently well in regard to his leg on the right as compared to previously noted. There does not appear to be any signs of active infection at this time which is great news and overall I am pleased that his systemically. Nonetheless locally still show signs of erythema he still has several days left of his antibiotic as apparently this was not started immediately after I wrote that for him last time. Nonetheless I think that he is taking the Levaquin now  and hopefully its doing job to help clear things up. Nonetheless if he is not significantly better by next week I would likely repeat the culture to try to see if anything is changed as again he still has some erythema this week that has me concerned at this point. 02/07/2021 on evaluation today patient actually appears to be doing well with regard to his wounds in his far as size and overall appearance is concerned. He still having a tremendous amount of drainage she has now completed the Levaquin and unfortunately as opposed to improving the erythema actually seems to have worsened. Has been off the Levaquin for several days therefore I did obtain a new culture today. With that being said I nonetheless feel like that the patient is showing signs of ongoing issues with cellulitis and to be honest not only is he having issues on the right leg which is what we have been monitoring here but he now is having issues on the left leg as well unfortunately. This is even up and around the knee and again I think this is of significant concern to the point that I feel like he should go to the ER for further evaluation and treatment. 07/19/2021 upon evaluation today patient appears for reevaluation here in the clinic last time I saw him was actually March 2022. At that time I sent him to the hospital due to the fact that he had a significant infection. Subsequently upon discharge from the hospital he was sent to a skilled nursing facility and I have not seen him since that time. Fortunately there does not appear to be any evidence of active infection at this time. No fevers, chills, nausea, vomiting, or diarrhea. With that being said I am somewhat concerned about the overall appearance of the wound bed. Now that it is consolidated to a much smaller area I feel like that the wound is somewhat irregular in the way it appears. For that reason I really do think that we need to probably go ahead and see about doing a  biopsy to test for the possibility of a skin cancer here. The patient is in agreement with that plan. [name], [name] ([id number]) 08/01/2021 upon evaluation today patient is actually seen for an  early appointment that he was actually supposed to be here on Thursday he comes in 3 days early due to the fact that the provider at [facility] felt like he needed to be seen sooner due to the severity of the wound. It does indeed appear to be doing significantly worse compared to the last time I saw him 2 weeks ago. In fact this is probably about 3 times the size it was then as far as the skin breakdown. There is also erythema and warmth and I am concerned about that as well. I think that the patient probably needs to go to the ER for further evaluation and treatment potentially IV antibiotics is what is can to be necessary here. READMISSION 12/09/2021 This is a 71 year old man who we know fairly well in this facility. 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 He had 2 visits in follow-up in August and the last time was sent to the ER with cellulitis of the right lower extremity. I see through looking in epic he was hospitalized once in September and once in December for cellulitis of the right lower extremity and a chronic venous ulcer. The [facility] a resident of the [facility]. He had Hydrofera Blue on the wound and kerlix when he came in. He has very significant erythema more distally towards the ankle and more proximally on the medial right leg. But this is not particularly tender he is not systemically unwell. The patient had ABIs TBI's in January 2022 at 0.910.76 on the right and 1.18 then 0.73 on the left respectively. 12/30/2021 upon evaluation today patient appears to be doing well with regard to his wound. He is actually measuring significantly smaller which is great news and overall I am extremely pleased with how Mr. Dymek appears today. I do not  see any signs of active infection locally or systemically at this point which is great news. 01/12/2022 upon evaluation today patient appears to be doing well with regard to his wounds. He has been tolerating the dressing changes without complication. He came in with nothing on today as he said at the facility they said they wanted to "leave it open to air". Nonetheless that is not the ordering is not what should be done into I given order otherwise. 3/10; patient comes in today with the wound on the right lateral lower leg in the setting of chronic venous insufficiency. A large amount of eschar around the wound edge with undermining. We have been using silver alginate with Tubigrip. The patient appears to be at [facility] skilled facility which is where [facility] has contracted beds Objective Constitutional Sitting or standing Blood Pressure is within target range for patient.. Pulse regular and within target range for patient.Marland Kitchen Respirations regular, non- labored and within target range.. Temperature is normal and within the target range for the patient.Marland Kitchen appears in no distress. Vitals Time Taken: 11:42 AM, Height: 67 in, Weight: 180 lbs, BMI: 28.2, Temperature: 97.6 F, Pulse: 59 bpm, Respiratory Rate: 18 breaths/min, Blood Pressure: 128/74 mmHg. General Notes: Wound exam; the wound surface did not look unhealthy however there was a thick rim of callus around this entire area. I used a #5 curette to remove this and also some subcutaneous debris. The wound surface after this looked quite healthy and I am hopeful that this will allow for final epithelialization. No evidence of surrounding infection. His edema control looked adequate Integumentary (Hair, Skin) Wound #3 status is  Open. Original cause of wound was Gradually Appeared. The date acquired was: 10/20/2021. The wound has been in treatment 7 weeks. The wound is located on the Right,Medial Lower Leg. The wound measures 0.6cm length x 0.5cm width x  0.2cm depth; 0.236cm^2 area and 0.047cm^3 volume. There is Fat Layer (Subcutaneous Tissue) exposed. There is no tunneling or undermining noted. There is a medium amount of serosanguineous drainage noted. The wound margin is flat and intact. There is small (1-33%) pink, pale, hyper - granulation within the wound bed. There is a large (67-100%) amount of necrotic tissue within the wound bed including Eschar and Adherent Slough. 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 There was a Excisional Skin/Subcutaneous Tissue Debridement with a total area of 0.3 sq cm performed by Ricard Dillon, MD. With the following instrument(s): Curette to remove Viable and Non-Viable tissue/material. Material removed includes Subcutaneous Tissue, Skin: Dermis, and Skin: Epidermis. No specimens were taken. A time out was conducted at 12:03, prior to the start of the procedure. A Moderate amount of bleeding was controlled with Pressure. The procedure was tolerated well. Post Debridement Measurements: 1cm length x 1.5cm width x 0.1cm depth; 0.118cm^3 volume. Character of Wound/Ulcer Post Debridement is stable. Severity of Tissue Post Debridement is: Fat layer exposed. Post procedure Diagnosis Wound #3: Same as Pre-Procedure Plan Follow-up Appointments: Return Appointment in 1 week. Bathing/ Shower/ Hygiene: May shower with wound dressing protected with water repellent cover or cast protector. Other: - Tubi E placed on LLL, ok per PA Stone Edema Control - Lymphedema / Segmental Compressive Device / Other: Elevate, Exercise Daily and Avoid  Standing for Long Periods of Time. Elevate legs to the level of the heart and pump ankles as often as possible Elevate leg(s) parallel to the floor when sitting. WOUND #3: - Lower Leg Wound Laterality: Right, Medial Cleanser: Normal Saline 2 x Per Week/30 Days Discharge Instructions: Wash your hands with soap and water. Remove old dressing, discard into plastic bag and place into trash. Cleanse the wound with Normal Saline prior to applying a clean dressing using gauze sponges, not tissues or cotton balls. Do not scrub or use excessive force. Pat dry using gauze sponges, not tissue or cotton balls. Topical: Triamcinolone Acetonide Cream, 0.1%, 15 (g) tube 2 x Per Week/30 Days Discharge Instructions: apply red areas to peri wound Primary Dressing: Hydrofera Blue Ready Transfer Foam, 4x5 (in/in) 2 x Per Week/30 Days Discharge Instructions: Apply Hydrofera Blue Ready to wound bed as directed Secured With: Conform 4'' - Conforming Stretch Gauze Bandage 4x75 (in/in) 2 x Per Week/30 Days Discharge Instructions: Apply as directed Secured With: Tubigrip Size E, 3.5x10 (in/yds) 2 x Per Week/30 Days Discharge Instructions: Apply 3 Tubigrip E 3-finger-widths below knee to base of toes to secure dressing and/or for swelling. 1. We are using Hydrofera Blue with Tubigrip compression. This is being changed 3 times a week by [facility]. 2. The patient has had a skilled facility and asking to be discharged. I told him to refer this to the clinicians [facility] Electronic Signature(s) Signed: 01/27/2022 1:04:01 PM By: Linton Ham MD Entered By: Linton Ham on 01/27/2022 12:31:07 [name] ([id number]) --------------------------------------------------------------------------------  SuperBill Details Patient Name: [name], [name] Date of Service: 01/27/2022 Medical Record Number: [id number] Patient Account Number: [id number] Date of Birth/Sex: Feb 07, 1951 (71 y.o. M) Treating RN: Levora Dredge Primary Care Provider: Barney Drain Other Clinician: Referring Provider: Barney Drain Treating Provider/Extender: Tito Dine in Treatment: 7 Diagnosis Coding ICD-10 Codes Code Description [phone] Chronic venous hypertension (idiopathic) with ulcer and inflammation of right lower extremity L97.818 Non-pressure chronic ulcer of other part of right lower leg with other specified severity Facility Procedures CPT4 Code Description: 42683419 11042 - DEB SUBQ TISSUE 20 SQ CM/< Modifier: Quantity: 1 CPT4 Code Description: ICD-10 Diagnosis Description L97.818 Non-pressure chronic ulcer of other part of right lower leg with other speci I87.331 Chronic venous hypertension (idiopathic) with ulcer and inflammation of righ Modifier: fied severity t lower extremi Quantity: ty Physician Procedures CPT4 Code Description: [id number] 89211 - WC PHYS SUBQ TISS 20 SQ CM Modifier: Quantity: 1 CPT4 Code Description: ICD-10 Diagnosis Description L97.818 Non-pressure chronic ulcer of other part of right lower leg with other speci I87.331 Chronic venous hypertension (idiopathic) with ulcer and inflammation of righ Modifier: fied severity t lower extremit Quantity: y Engineer, maintenance) Signed: 01/27/2022 1:04:01 PM By: Linton Ham MD Entered By: Linton Ham on 01/27/2022 12:31:20

## 2022-02-09 ENCOUNTER — Encounter: Payer: Medicare (Managed Care) | Admitting: Physician Assistant

## 2022-02-09 ENCOUNTER — Other Ambulatory Visit: Payer: Self-pay

## 2022-02-09 DIAGNOSIS — I87331 Chronic venous hypertension (idiopathic) with ulcer and inflammation of right lower extremity: Secondary | ICD-10-CM | POA: Diagnosis not present

## 2022-02-09 NOTE — Progress Notes (Addendum)
BRAYDON, KULLMAN ([id number]) ?Visit Report for 02/09/2022 ?Chief Complaint Document Details ?Patient Name: [name], [name] ?Date of Service: 02/09/2022 2:30 PM ?Medical Record Number: [id number] ?Patient Account Number: [id number] ?Date of Birth/Sex: 1951/05/16 (71 y.o. M) ?Treating RN: Donnamarie Poag ?Primary Care Provider: Barney Drain Other Clinician: ?Referring Provider: Barney Drain ?Treating Provider/Extender: Jeri Cos ?Weeks in Treatment: 8 ?Information Obtained from: Patient ?Chief Complaint ?Right LE Ulcer ?Electronic Signature(s) ?Signed: 02/09/2022 2:54:13 PM By: Worthy Keeler PA-C ?Entered By: Worthy Keeler on 02/09/2022 14:54:13 ?[name], [name] ([id number]) ?-------------------------------------------------------------------------------- ?HPI Details ?Patient Name: [name], [name] ?Date of Service: 02/09/2022 2:30 PM ?Medical Record Number: [id number] ?Patient Account Number: [id number] ?Date of Birth/Sex: 01-Jan-1951 (71 y.o. M) ?Treating RN: Donnamarie Poag ?Primary Care Provider: Barney Drain Other Clinician: ?Referring Provider: Barney Drain ?Treating Provider/Extender: Jeri Cos ?Weeks in Treatment: 8 ?History of Present Illness ?HPI Description: 12/21/2020 upon evaluation today patient appears to be doing somewhat poorly in regard to his right lower extremity. He does ?have some erythema and is also having issues with quite a bit of drainage currently from a wound that he tells me has been present since about 2 ?months ago when he initially had an injury that led to this wound beginning. He does have chronic venous stasis and is secondary to this I think it ?has been more difficult to heal this wound than normal. With that being said the patient tells me that this tends to drain a lot down onto his feet. ?He is also had some issues with fungal infection he is currently on miconazole powder for that. Fortunately there is no signs of active infection ?systemically at this time. No  fevers, chills, nausea, vomiting, or diarrhea. He does have pain but again this is more to the posterior aspect not ?really more lateral or anterior. There is evidence that he may have some local infection due to erythema and warmth surrounding the wound post ?debridement I do want to obtain a wound culture to evaluate and see if there is anything indeed of concern here. He did have arterial studies ?which revealed that he had a ABI of 0.91 on the right with a TBI of 0.76 in the left his ABI was 1.18 with a TBI of 0.73. He has good blood flow ?here. In regard to his x-ray that was taken on 11/30/2020 there was no evidence of osseous abnormality. The arterial study was actually ?performed on 12/03/2020. The patient was hospitalized from 11/30/2020 through 12/02/2020 during that time he was on IV antibiotics including ?vancomycin and cefepime. ?12/28/2020 upon evaluation today patient appears to be doing somewhat poorly in regard to his legs currently is having a lot of drainage ?unfortunately which again he was not actually being wrapped with a 3 layer compression wrap after this was changed at [facility]. Subsequently I ?think that the patient does need to have ongoing compression therapy. Right now that means that were doing the compression wrap later he will ?need compression stockings. Either way though I think that he cannot just have a border foam dressing on this which was what was on today that ?is not sufficient to catch any of the drainage and to be honest I think this can lead to infection and greater issues here for him. The wound is ?pretty much about circumferential at this point a weeping area included. ?01/04/2021 upon evaluation today patient unfortunately is doing quite a bit worse in regard to the overall appearance of his wound from the ?standpoint of infection. I  did see evidence of bright green drainage consistent with Pseudomonas which I discussed with the patient today as well ?as his representative from  social services. Nonetheless I believe that we likely need to treat him for Pseudomonas right now the clindamycin he is ?on is not covering for this. With that being said no sharp debridement was performed today. Of note the patient's Hydrofera Blue dressing was not ?the ready transfer version and unfortunately did cause this did not drain quite as well as we would like to. Nonetheless I do feel like that the ?patient would benefit from continuing with the wraps changed out their [facility] I think they can definitely do a good job and his swelling is under ?good control the big thing here is that we need to make sure that the dressing is applied appropriately we will contact them just to confirm and ?make sure that they know the appropriate way to apply the [facility] that they have. ?01/11/2021 upon evaluation today patient appears to be doing somewhat better in regard to the erythema on his leg at this point. Fortunately ?there are no signs of active infection at this time. No fevers, chills, nausea, vomiting, or diarrhea. With that being said he has been tolerating the ?dressing changes without complication and very pleased in that regard. I do think that he seems to be making good progress in general which is ?good news. I think that the biggest issue I see right now is simply the [facility] that is being used cannot transfer through to the [place] ?and therefore extracting a lot more fluid against the skin which is causing some irritation as well. I think that we may be able to wrap him here ?and get things in a better position for him to be honest. ?01/18/2021 upon evaluation today patient appears to be doing better in regard to his leg. I feel like he is definitely making improvement and overall ?I think that this is significantly improved compared to the previous evaluation. With that being said there is still quite a ways to go to get this to ?heal and it is going to take quite a bit of time. Fortunately  there is no evidence of active infection at this time. No fevers, chills, nausea, vomiting, ?or diarrhea. ?01/25/2021 upon evaluation today patient appears to be doing well with regard to his leg. I feel like he is definitely showing signs of improvement ?which is great news. There is no evidence of active infection at this time. No fevers, chills, nausea, vomiting, or diarrhea. ?02/01/2021 upon evaluation today patient appears to be doing decently well in regard to his leg on the right as compared to previously noted. ?There does not appear to be any signs of active infection at this time which is great news and overall I am pleased that his systemically. ?Nonetheless locally still show signs of erythema he still has several days left of his antibiotic as apparently this was not started immediately after I ?wrote that for him last time. Nonetheless I think that he is taking the Levaquin now and hopefully its doing job to help clear things up. ?Nonetheless if he is not significantly better by next week I would likely repeat the culture to try to see if anything is changed as again he still has ?some erythema this week that has me concerned at this point. ?02/07/2021 on evaluation today patient actually appears to be doing well with regard to his wounds in his far as size and overall  appearance is ?concerned. He still having a tremendous amount of drainage she has now completed the Levaquin and unfortunately as opposed to improving the ?erythema actually seems to have worsened. Has been off the Levaquin for several days therefore I did obtain a new culture today. With that being ?said I nonetheless feel like that the patient is showing signs of ongoing issues with cellulitis and to be honest not only is he having issues on the ?right leg which is what we have been monitoring here but he now is having issues on the left leg as well unfortunately. This is even up and around ?the knee and again I think this is of  significant concern to the point that I feel like he should go to the ER for further evaluation and treatment. ?07/19/2021 upon evaluation today patient appears for reevaluation here in the clinic last time I saw him wa

## 2022-02-09 NOTE — Progress Notes (Addendum)
[name] ([id number]) ?Visit Report for 02/09/2022 ?Arrival Information Details ?Patient Name: [name], [name] ?Date of Service: 02/09/2022 2:30 PM ?Medical Record Number: [id number] ?Patient Account Number: [id number] ?Date of Birth/Sex: 05/17/1951 (71 y.o. M) ?Treating RN: Donnamarie Poag ?Primary Care Jazelyn Sipe: Barney Drain Other Clinician: ?Referring Deion Swift: Barney Drain ?Treating Jenia Klepper/Extender: Jeri Cos ?Weeks in Treatment: 8 ?Visit Information History Since Last Visit ?Added or deleted any medications: No ?Patient Arrived: Wheel Chair ?Had a fall or experienced change in No ?Arrival Time: 14:46 ?activities of daily living that may affect ?Accompanied By: self ?risk of falls: ?Transfer Assistance: EasyPivot Patient Lift ?Hospitalized since last visit: No ?Patient Identification Verified: Yes ?Has Dressing in Place as Prescribed: Yes ?Secondary Verification Process Completed: Yes ?Pain Present Now: Yes ?Patient Requires Transmission-Based No ?Precautions: ?Patient Has Alerts: Yes ?Patient Alerts: R. ABI.91 TBI .76 ?12/02/20 ?L ABI 1.18 TBI.73 ?12/02/20 ?[facility] ?Electronic Signature(s) ?Signed: 02/09/2022 4:16:55 PM By: Donnamarie Poag ?Entered ByDonnamarie Poag on 02/09/2022 14:54:55 ?ROMONE, SHAFF ([id number]) ?-------------------------------------------------------------------------------- ?Clinic Level of Care Assessment Details ?Patient Name: [name], [name] ?Date of Service: 02/09/2022 2:30 PM ?Medical Record Number: [id number] ?Patient Account Number: [id number] ?Date of Birth/Sex: 1951/04/21 (71 y.o. M) ?Treating RN: Donnamarie Poag ?Primary Care Millan Legan: Barney Drain Other Clinician: ?Referring Juaquin Ludington: Barney Drain ?Treating Harjot Zavadil/Extender: Jeri Cos ?Weeks in Treatment: 8 ?Clinic Level of Care Assessment Items ?TOOL 4 Quantity Score ?'[]'$  - Use when only an EandM is performed on FOLLOW-UP visit 0 ?ASSESSMENTS - Nursing Assessment / Reassessment ?X - Reassessment of  Co-morbidities (includes updates in patient status) 1 10 ?X- 1 5 ?Reassessment of Adherence to Treatment Plan ?ASSESSMENTS - Wound and Skin Assessment / Reassessment ?X - Simple Wound Assessment / Reassessment - one wound 1 5 ?'[]'$  - 0 ?Complex Wound Assessment / Reassessment - multiple wounds ?'[]'$  - 0 ?Dermatologic / Skin Assessment (not related to wound area) ?ASSESSMENTS - Focused Assessment ?'[]'$  - Circumferential Edema Measurements - multi extremities 0 ?'[]'$  - 0 ?Nutritional Assessment / Counseling / Intervention ?'[]'$  - 0 ?Lower Extremity Assessment (monofilament, tuning fork, pulses) ?'[]'$  - 0 ?Peripheral Arterial Disease Assessment (using hand held doppler) ?ASSESSMENTS - Ostomy and/or Continence Assessment and Care ?'[]'$  - Incontinence Assessment and Management 0 ?'[]'$  - 0 ?Ostomy Care Assessment and Management (repouching, etc.) ?PROCESS - Coordination of Care ?X - Simple Patient / Family Education for ongoing care 1 15 ?'[]'$  - 0 ?Complex (extensive) Patient / Family Education for ongoing care ?'[]'$  - 0 ?Staff obtains Consents, Records, Test Results / Process Orders ?X- 1 10 ?Staff telephones HHA, Nursing Homes / Clarify orders / etc ?'[]'$  - 0 ?Routine Transfer to another Facility (non-emergent condition) ?'[]'$  - 0 ?Routine Hospital Admission (non-emergent condition) ?'[]'$  - 0 ?New Admissions / Biomedical engineer / Ordering NPWT, Apligraf, etc. ?'[]'$  - 0 ?Emergency Hospital Admission (emergent condition) ?X- 1 10 ?Simple Discharge Coordination ?'[]'$  - 0 ?Complex (extensive) Discharge Coordination ?PROCESS - Special Needs ?'[]'$  - Pediatric / Minor Patient Management 0 ?'[]'$  - 0 ?Isolation Patient Management ?'[]'$  - 0 ?Hearing / Language / Visual special needs ?'[]'$  - 0 ?Assessment of Community assistance (transportation, D/C planning, etc.) ?'[]'$  - 0 ?Additional assistance / Altered mentation ?'[]'$  - 0 ?Support Surface(s) Assessment (bed, cushion, seat, etc.) ?INTERVENTIONS - Wound Cleansing / Measurement ?TOME, WILSON ([id number]) ?X-  1 5 ?Simple Wound Cleansing - one wound ?'[]'$  - 0 ?Complex Wound Cleansing - multiple wounds ?X- 1 5 ?Wound Imaging (photographs - any number of wounds) ?'[]'$  - 0 ?Wound  Tracing (instead of photographs) ?X- 1 5 ?Simple Wound Measurement - one wound ?'[]'$  - 0 ?Complex Wound Measurement - multiple wounds ?INTERVENTIONS - Wound Dressings ?'[]'$  - Small Wound Dressing one or multiple wounds 0 ?'[]'$  - 0 ?Medium Wound Dressing one or multiple wounds ?'[]'$  - 0 ?Large Wound Dressing one or multiple wounds ?X- 1 5 ?Application of Medications - topical ?'[]'$  - 0 ?Application of Medications - injection ?INTERVENTIONS - Miscellaneous ?'[]'$  - External ear exam 0 ?'[]'$  - 0 ?Specimen Collection (cultures, biopsies, blood, body fluids, etc.) ?'[]'$  - 0 ?Specimen(s) / Culture(s) sent or taken to Lab for analysis ?'[]'$  - 0 ?Patient Transfer (multiple staff / Civil Service fast streamer / Similar devices) ?'[]'$  - 0 ?Simple Staple / Suture removal (25 or less) ?'[]'$  - 0 ?Complex Staple / Suture removal (26 or more) ?'[]'$  - 0 ?Hypo / Hyperglycemic Management (close monitor of Blood Glucose) ?'[]'$  - 0 ?Ankle / Brachial Index (ABI) - do not check if billed separately ?X- 1 5 ?Vital Signs ?Has the patient been seen at the hospital within the last three years: Yes ?Total Score: 80 ?Level Of Care: New/Established - Level ?3 ?Electronic Signature(s) ?Signed: 02/09/2022 4:16:55 PM By: Donnamarie Poag ?Entered ByDonnamarie Poag on 02/09/2022 15:21:22 ?AREK, SPADAFORE ([id number]) ?-------------------------------------------------------------------------------- ?Encounter Discharge Information Details ?Patient Name: [name] ?Date of Service: 02/09/2022 2:30 PM ?Medical Record Number: [id number] ?Patient Account Number: [id number] ?Date of Birth/Sex: Jul 03, 1951 (71 y.o. M) ?Treating RN: Donnamarie Poag ?Primary Care Runa Whittingham: Barney Drain Other Clinician: ?Referring Roma Bierlein: Barney Drain ?Treating Mance Vallejo/Extender: Jeri Cos ?Weeks in Treatment: 8 ?Encounter Discharge Information  Items ?Discharge Condition: Stable ?Ambulatory Status: Wheelchair ?Discharge Destination: [place] ?Telephoned: No ?Orders Sent: Yes ?Transportation: Other ?Accompanied By: self ?Schedule Follow-up Appointment: No ?Clinical Summary of Care: ?Electronic Signature(s) ?Signed: 02/09/2022 4:16:55 PM By: Donnamarie Poag ?Entered ByDonnamarie Poag on 02/09/2022 15:37:18 ?NIKOLAI, WILCZAK ([id number]) ?-------------------------------------------------------------------------------- ?Lower Extremity Assessment Details ?Patient Name: TRAE, [name] ?Date of Service: 02/09/2022 2:30 PM ?Medical Record Number: [id number] ?Patient Account Number: [id number] ?Date of Birth/Sex: 06-23-1951 (71 y.o. M) ?Treating RN: Donnamarie Poag ?Primary Care Jayzon Taras: Barney Drain Other Clinician: ?Referring Elisabet Gutzmer: Barney Drain ?Treating Guenther Dunshee/Extender: Jeri Cos ?Weeks in Treatment: 8 ?Edema Assessment ?Assessed: [Left: No] [Right: Yes] ?[Left: Edema] [Right: :] ?Calf ?Left: Right: ?Point of Measurement: 31 cm From Medial Instep 32 cm ?Ankle ?Left: Right: ?Point of Measurement: 11 cm From Medial Instep 25 cm ?Vascular Assessment ?Pulses: ?Dorsalis Pedis ?Palpable: [Right:Yes] ?Electronic Signature(s) ?Signed: 02/09/2022 4:16:55 PM By: Donnamarie Poag ?Entered ByDonnamarie Poag on 02/09/2022 15:04:05 ?MARGUES, FILIPPINI ([id number]) ?-------------------------------------------------------------------------------- ?Multi Wound Chart Details ?Patient Name: [name], NAFZIGER ?Date of Service: 02/09/2022 2:30 PM ?Medical Record Number: [id number] ?Patient Account Number: [id number] ?Date of Birth/Sex: 1951/06/14 (71 y.o. M) ?Treating RN: Donnamarie Poag ?Primary Care Maeci Kalbfleisch: Barney Drain Other Clinician: ?Referring Qusai Kem: Barney Drain ?Treating Jessamy Torosyan/Extender: Jeri Cos ?Weeks in Treatment: 8 ?Vital Signs ?Height(in): 67 ?Pulse(bpm): 67 ?Weight(lbs): 180 ?Blood Pressure(mmHg): 150/89 ?Body Mass Index(BMI):  28.2 ?Temperature(??F): 98.7 ?Respiratory Rate(breaths/min): 16 ?Photos: [N/A:N/A] ?Wound Location: Right, Medial Lower Leg N/A N/A ?Wounding Event: Gradually Appeared N/A N/A ?Primary Etiology: Venous Leg Ulcer N/A N/A ?Comorb

## 2022-09-15 IMAGING — MR MR SHOULDER*R* W/O CM
4 of 5 series · 21 of 40 positions shown · non-contrast
Comparison: X-ray 09/09/2020

CLINICAL DATA: Right shoulder pain for 2 weeks. No known recent
injury. History of proximal humeral fracture in 1661

EXAM:
MRI OF THE RIGHT SHOULDER WITHOUT CONTRAST
TECHNIQUE: Multiplanar, multisequence MR imaging of the shoulder was performed.
No intravenous contrast was administered.

[Series 6: T2 fat-sat · axial · right · 3.0mm · 0.59mm/px · z∈[-63,+38]mm · 8 of 27 slices shown (1 of 3)]
[im 1/27]
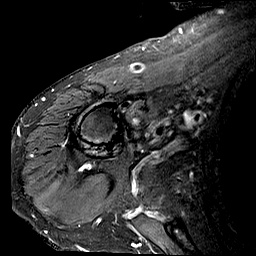
[im 3/27]
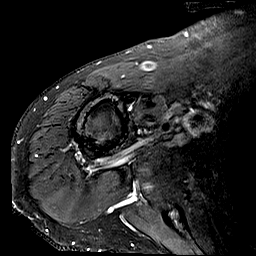
[im 9/27]
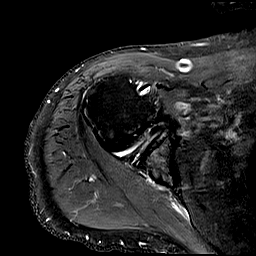
[im 12/27]
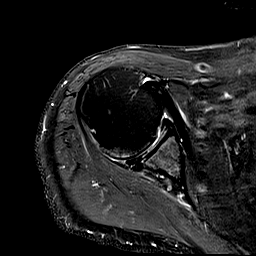
[im 15/27]
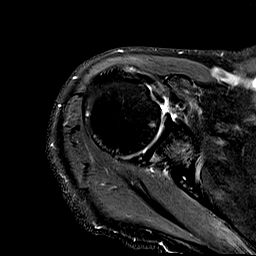
[im 18/27]
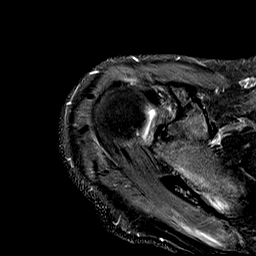
[im 24/27]
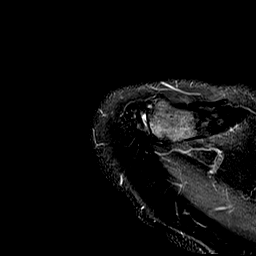
[im 27/27]
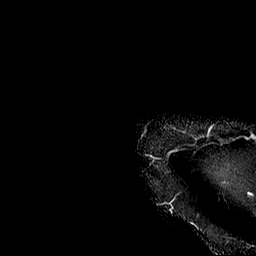

[Series 7: T2 fat-sat · oblique · right · 4.0mm · 0.22mm/px · 3 of 21 slices shown (2 of 3)]
[im 4/21]
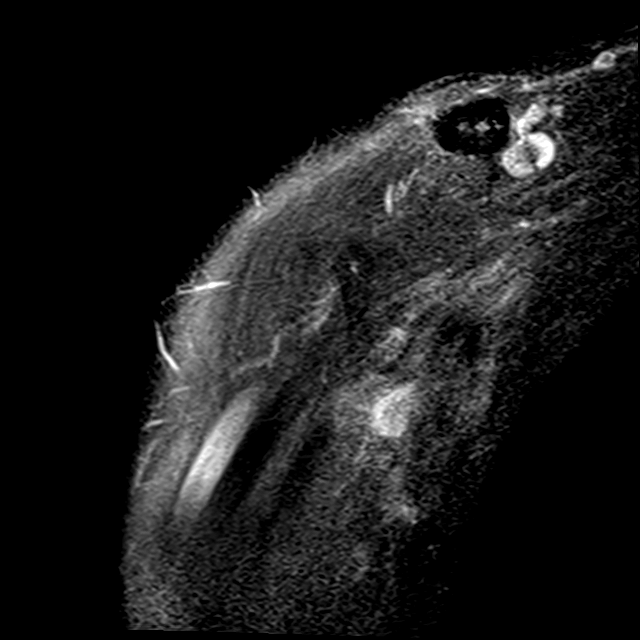
[im 11/21]
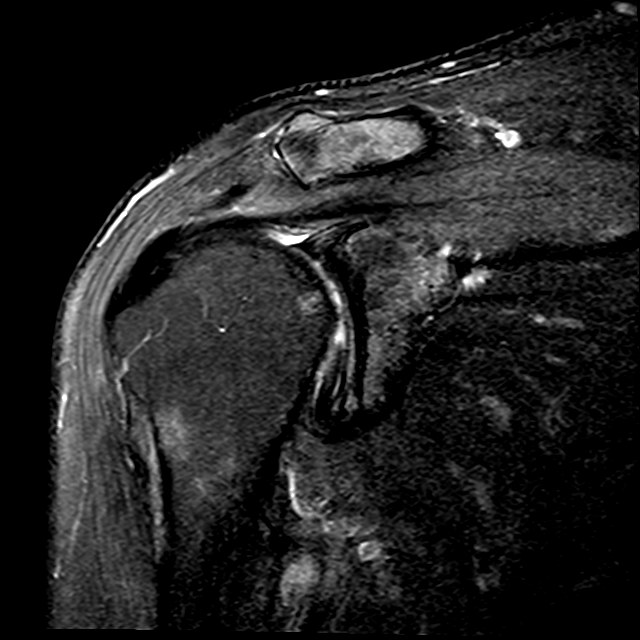
[im 17/21]
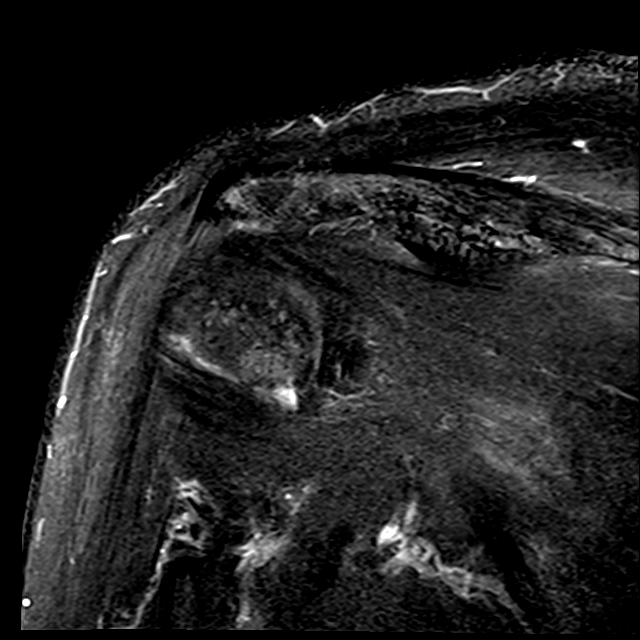

[Series 8: PD · oblique · right · 4.0mm · 0.22mm/px · 7 of 21 slices shown]
[im 1/21]
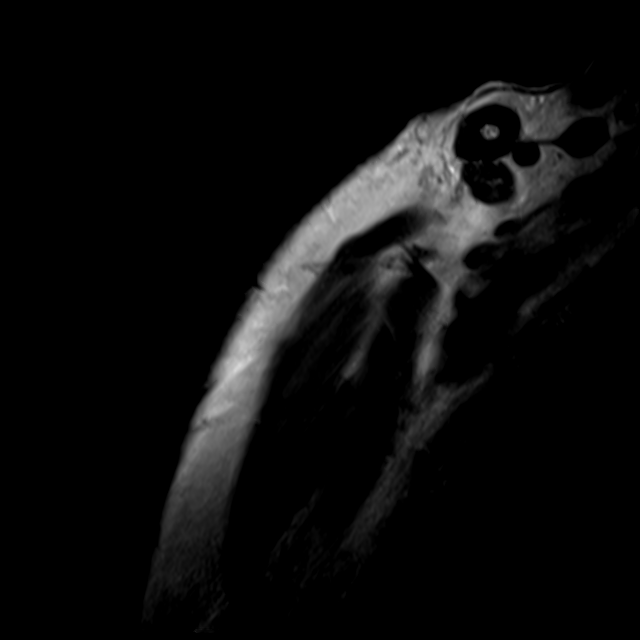
[im 4/21]
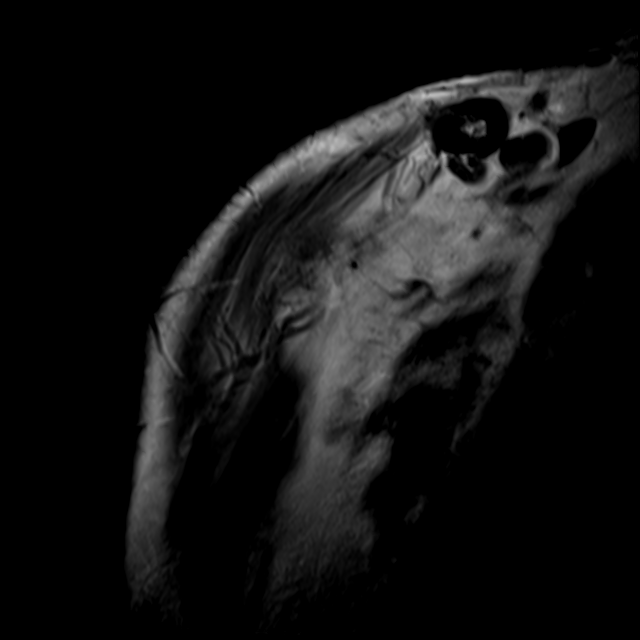
[im 7/21]
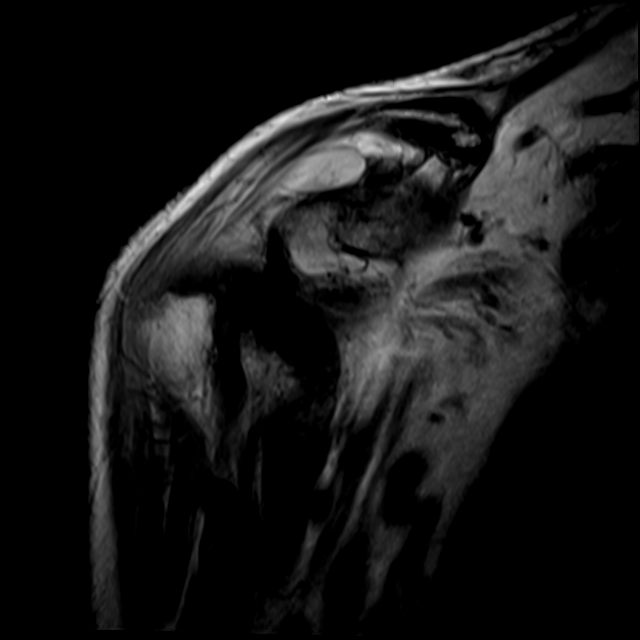
[im 11/21]
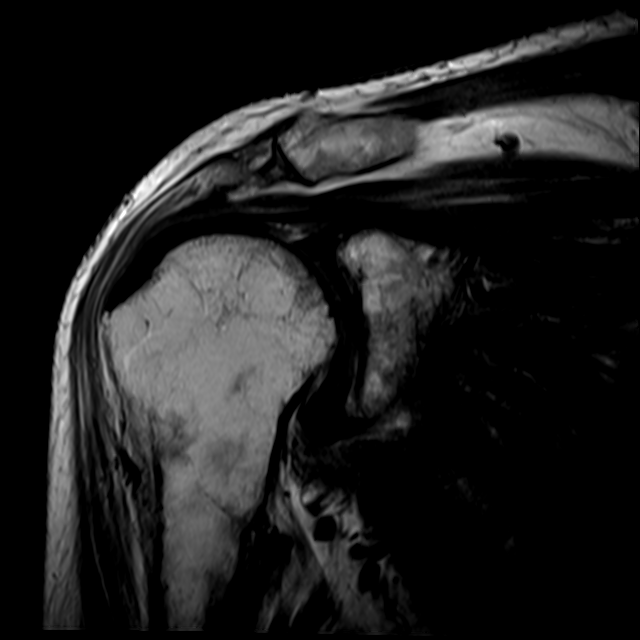
[im 14/21]
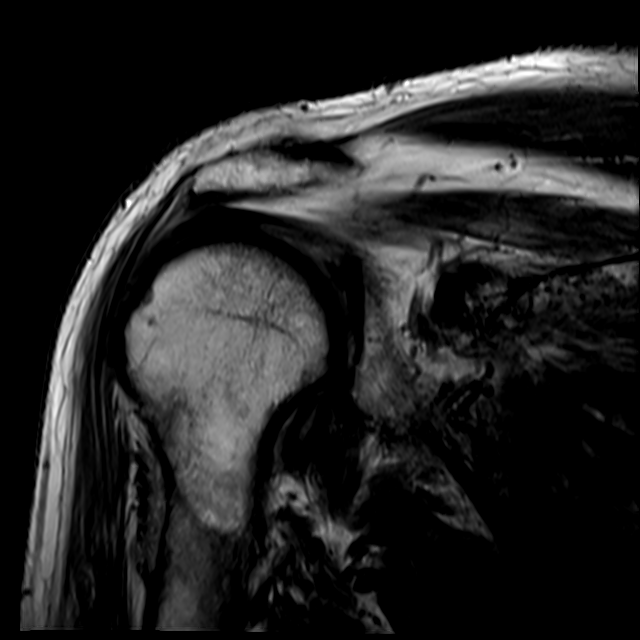
[im 17/21]
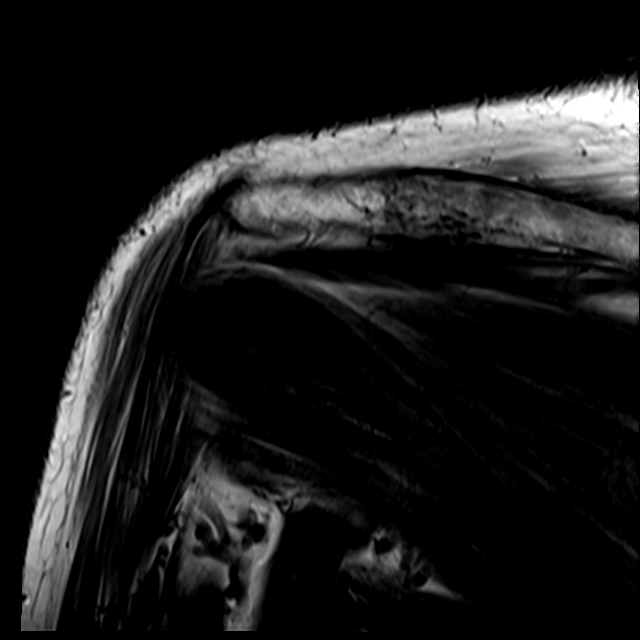
[im 21/21]
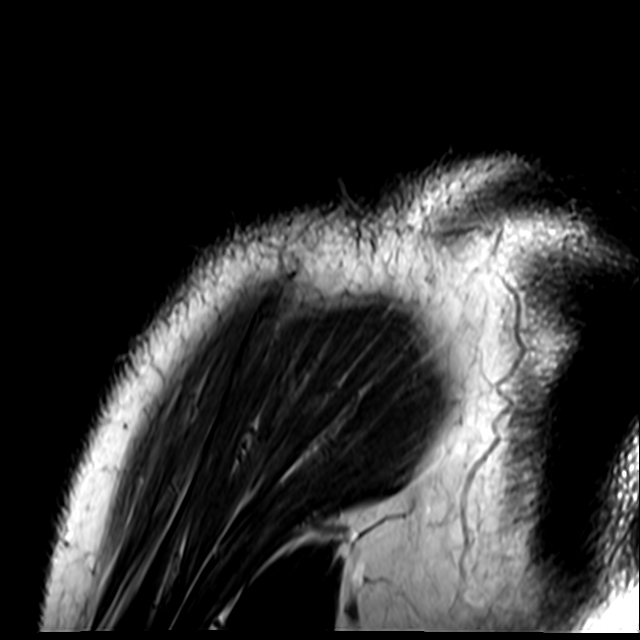

[Series 9: T2 fat-sat · oblique · right · 4.0mm · 0.44mm/px · 3 of 23 slices shown (3 of 3)]
[im 4/23]
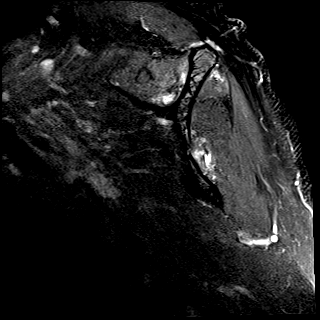
[im 13/23]
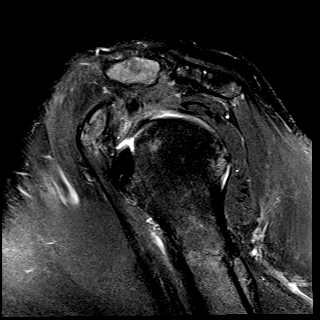
[im 19/23]
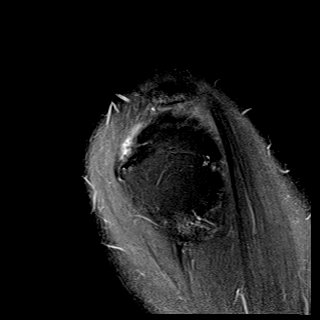

[21 of 40 positions shown; findings below may reference images not displayed]

FINDINGS: Rotator cuff: Low-grade bursal sided fraying/irregularity of the
anterior most aspect of the distal supraspinatus tendon (series 7,
images 13-14). Mild infraspinatus tendinosis. Subscapularis and
teres minor tendons intact. No high-grade or full-thickness rotator
cuff tear.

Muscles: There are small focal areas of intramuscular edema within
the posteroinferior aspect of the supraspinatus muscle belly as well
as within the far superior and inferior aspects of the infraspinatus
muscle (series 9, image 1). No intramuscular fluid collection.
Preserved muscle bulk without atrophy or fatty infiltration.

Biceps long head: Intact. Small amount of fluid within the biceps
tendon sheath.

Acromioclavicular Joint: Mild arthropathy of the acromioclavicular
joint. No subacromial-subdeltoid bursal fluid.

Glenohumeral Joint: Mild-moderate diffuse cartilage thinning of the
humeral head and glenoid, most pronounced posteriorly. No
glenohumeral joint effusion.

Labrum: Diffuse labral degeneration. Posteroinferior labrum is torn
(series 6, images 18-20). No paralabral cyst.

Bones: Healed fracture deformity of the proximal humeral metaphysis.
No acute fracture. No dislocation. No bone marrow edema. Mild
reactive marrow signal changes within the posteroinferior glenoid
and posterior humeral head. No suspicious bone lesion.

Other: None.
IMPRESSION: 1. Mild rotator cuff tendinosis with low-grade bursal sided
fraying/irregularity of the anterior most aspect of the distal
supraspinatus tendon. No high-grade or full-thickness rotator cuff
tear.
2. Small focal areas of intramuscular edema within the supraspinatus
and infraspinatus muscle bellies. Findings may represent areas of
muscle strain.
3. Mild-to-moderate glenohumeral and acromioclavicular
osteoarthritis.
4. Labral degeneration with posteroinferior labral tear.

## 2023-10-12 ENCOUNTER — Other Ambulatory Visit: Payer: Self-pay

## 2023-10-12 ENCOUNTER — Inpatient Hospital Stay (HOSPITAL_COMMUNITY)
Admission: EM | Admit: 2023-10-12 | Discharge: 2023-10-19 | DRG: 824 | Disposition: A | Discharge status: Skilled Nursing Facility | Payer: Medicare (Managed Care) | Attending: Internal Medicine | Admitting: Internal Medicine

## 2023-10-12 ENCOUNTER — Emergency Department (HOSPITAL_COMMUNITY): Payer: Medicare (Managed Care)

## 2023-10-12 DIAGNOSIS — Z87820 Personal history of traumatic brain injury: Secondary | ICD-10-CM

## 2023-10-12 DIAGNOSIS — R590 Localized enlarged lymph nodes: Secondary | ICD-10-CM | POA: Diagnosis present

## 2023-10-12 DIAGNOSIS — T796XXA Traumatic ischemia of muscle, initial encounter: Secondary | ICD-10-CM | POA: Diagnosis present

## 2023-10-12 DIAGNOSIS — J4530 Mild persistent asthma, uncomplicated: Secondary | ICD-10-CM | POA: Diagnosis present

## 2023-10-12 DIAGNOSIS — K869 Disease of pancreas, unspecified: Secondary | ICD-10-CM | POA: Diagnosis present

## 2023-10-12 DIAGNOSIS — Z9842 Cataract extraction status, left eye: Secondary | ICD-10-CM

## 2023-10-12 DIAGNOSIS — Z7951 Long term (current) use of inhaled steroids: Secondary | ICD-10-CM

## 2023-10-12 DIAGNOSIS — C778 Secondary and unspecified malignant neoplasm of lymph nodes of multiple regions: Secondary | ICD-10-CM

## 2023-10-12 DIAGNOSIS — Z9841 Cataract extraction status, right eye: Secondary | ICD-10-CM

## 2023-10-12 DIAGNOSIS — Y92009 Unspecified place in unspecified non-institutional (private) residence as the place of occurrence of the external cause: Secondary | ICD-10-CM | POA: Diagnosis not present

## 2023-10-12 DIAGNOSIS — Z79899 Other long term (current) drug therapy: Secondary | ICD-10-CM

## 2023-10-12 DIAGNOSIS — E440 Moderate protein-calorie malnutrition: Secondary | ICD-10-CM | POA: Diagnosis present

## 2023-10-12 DIAGNOSIS — R591 Generalized enlarged lymph nodes: Secondary | ICD-10-CM

## 2023-10-12 DIAGNOSIS — I878 Other specified disorders of veins: Secondary | ICD-10-CM | POA: Diagnosis present

## 2023-10-12 DIAGNOSIS — Z6822 Body mass index (BMI) 22.0-22.9, adult: Secondary | ICD-10-CM | POA: Diagnosis not present

## 2023-10-12 DIAGNOSIS — R748 Abnormal levels of other serum enzymes: Secondary | ICD-10-CM | POA: Diagnosis present

## 2023-10-12 DIAGNOSIS — C770 Secondary and unspecified malignant neoplasm of lymph nodes of head, face and neck: Principal | ICD-10-CM | POA: Diagnosis present

## 2023-10-12 DIAGNOSIS — M542 Cervicalgia: Secondary | ICD-10-CM | POA: Diagnosis present

## 2023-10-12 DIAGNOSIS — W010XXA Fall on same level from slipping, tripping and stumbling without subsequent striking against object, initial encounter: Secondary | ICD-10-CM | POA: Diagnosis present

## 2023-10-12 DIAGNOSIS — W19XXXA Unspecified fall, initial encounter: Principal | ICD-10-CM

## 2023-10-12 DIAGNOSIS — N2889 Other specified disorders of kidney and ureter: Secondary | ICD-10-CM | POA: Diagnosis present

## 2023-10-12 DIAGNOSIS — R001 Bradycardia, unspecified: Secondary | ICD-10-CM | POA: Diagnosis present

## 2023-10-12 DIAGNOSIS — Z833 Family history of diabetes mellitus: Secondary | ICD-10-CM

## 2023-10-12 DIAGNOSIS — K4090 Unilateral inguinal hernia, without obstruction or gangrene, not specified as recurrent: Secondary | ICD-10-CM | POA: Diagnosis present

## 2023-10-12 DIAGNOSIS — C259 Malignant neoplasm of pancreas, unspecified: Secondary | ICD-10-CM

## 2023-10-12 DIAGNOSIS — K8689 Other specified diseases of pancreas: Secondary | ICD-10-CM

## 2023-10-12 DIAGNOSIS — C8590 Non-Hodgkin lymphoma, unspecified, unspecified site: Secondary | ICD-10-CM | POA: Diagnosis not present

## 2023-10-12 DIAGNOSIS — D509 Iron deficiency anemia, unspecified: Secondary | ICD-10-CM | POA: Diagnosis present

## 2023-10-12 DIAGNOSIS — C799 Secondary malignant neoplasm of unspecified site: Secondary | ICD-10-CM

## 2023-10-12 DIAGNOSIS — I10 Essential (primary) hypertension: Secondary | ICD-10-CM | POA: Diagnosis present

## 2023-10-12 DIAGNOSIS — S069XAA Unspecified intracranial injury with loss of consciousness status unknown, initial encounter: Secondary | ICD-10-CM | POA: Diagnosis present

## 2023-10-12 LAB — COMPREHENSIVE METABOLIC PANEL
ALT: 27 U/L (ref 0–44)
AST: 51 U/L — ABNORMAL HIGH (ref 15–41)
Albumin: 3.1 g/dL — ABNORMAL LOW (ref 3.5–5.0)
Alkaline Phosphatase: 71 U/L (ref 38–126)
Anion gap: 10 (ref 5–15)
BUN: 10 mg/dL (ref 8–23)
CO2: 24 mmol/L (ref 22–32)
Calcium: 8.9 mg/dL (ref 8.9–10.3)
Chloride: 103 mmol/L (ref 98–111)
Creatinine, Ser: 0.73 mg/dL (ref 0.61–1.24)
GFR, Estimated: >60 mL/min (ref >60)
Glucose, Bld: 85 mg/dL (ref 70–99)
Potassium: 3.5 mmol/L (ref 3.5–5.1)
Sodium: 137 mmol/L (ref 135–145)
Total Bilirubin: 0.8 mg/dL (ref <1.2)
Total Protein: 6.2 g/dL — ABNORMAL LOW (ref 6.5–8.1)

## 2023-10-12 LAB — CBC WITH DIFFERENTIAL/PLATELET
Abs Immature Granulocytes: 0.02 10*3/uL (ref 0.00–0.07)
Basophils Absolute: 0.1 10*3/uL (ref 0.0–0.1)
Basophils Relative: 1 %
Eosinophils Absolute: 0.1 10*3/uL (ref 0.0–0.5)
Eosinophils Relative: 1 %
HCT: 33 % — ABNORMAL LOW (ref 39.0–52.0)
Hemoglobin: 10.2 g/dL — ABNORMAL LOW (ref 13.0–17.0)
Immature Granulocytes: 0 %
Lymphocytes Relative: 11 %
Lymphs Abs: 0.9 10*3/uL (ref 0.7–4.0)
MCH: 24.1 pg — ABNORMAL LOW (ref 26.0–34.0)
MCHC: 30.9 g/dL (ref 30.0–36.0)
MCV: 77.8 fL — ABNORMAL LOW (ref 80.0–100.0)
Monocytes Absolute: 0.5 10*3/uL (ref 0.1–1.0)
Monocytes Relative: 7 %
Neutro Abs: 6.3 10*3/uL (ref 1.7–7.7)
Neutrophils Relative %: 80 %
Platelets: 303 10*3/uL (ref 150–400)
RBC: 4.24 MIL/uL (ref 4.22–5.81)
RDW: 18.9 % — ABNORMAL HIGH (ref 11.5–15.5)
WBC: 7.8 10*3/uL (ref 4.0–10.5)
nRBC: 0 % (ref 0.0–0.2)

## 2023-10-12 LAB — URIC ACID: Uric Acid, Serum: 6.9 mg/dL (ref 3.7–8.6)

## 2023-10-12 LAB — LACTATE DEHYDROGENASE: LDH: 259 U/L — ABNORMAL HIGH (ref 98–192)

## 2023-10-12 LAB — CK: Total CK: 1104 U/L — ABNORMAL HIGH (ref 49–397)

## 2023-10-12 MED ORDER — SENNOSIDES-DOCUSATE SODIUM 8.6-50 MG PO TABS: 1.0000 | ORAL_TABLET | Freq: Every evening | ORAL | Status: DC | PRN

## 2023-10-12 MED ORDER — MOMETASONE FURO-FORMOTEROL FUM 200-5 MCG/ACT IN AERO
2.0000 | INHALATION_SPRAY | Freq: Two times a day (BID) | RESPIRATORY_TRACT | Status: DC
  Administered 2023-10-13 – 2023-10-19 (×11): 2 via RESPIRATORY_TRACT
  Filled 2023-10-12: qty 8.8

## 2023-10-12 MED ORDER — IOHEXOL 350 MG/ML SOLN
75.0000 mL | Freq: Once | INTRAVENOUS | Status: AC | PRN
  Administered 2023-10-12: 75 mL via INTRAVENOUS

## 2023-10-12 MED ORDER — SODIUM CHLORIDE 0.9 % IV BOLUS
1000.0000 mL | Freq: Once | INTRAVENOUS | Status: AC
  Administered 2023-10-12: 1000 mL via INTRAVENOUS

## 2023-10-12 MED ORDER — ACETAMINOPHEN 650 MG RE SUPP: 650.0000 mg | Freq: Four times a day (QID) | RECTAL | Status: DC | PRN

## 2023-10-12 MED ORDER — ONDANSETRON HCL 4 MG PO TABS: 4.0000 mg | ORAL_TABLET | Freq: Four times a day (QID) | ORAL | Status: DC | PRN

## 2023-10-12 MED ORDER — ACETAMINOPHEN 325 MG PO TABS
650.0000 mg | ORAL_TABLET | Freq: Four times a day (QID) | ORAL | Status: DC | PRN
  Administered 2023-10-13 – 2023-10-19 (×11): 650 mg via ORAL
  Filled 2023-10-12 (×12): qty 2

## 2023-10-12 MED ORDER — POTASSIUM CHLORIDE IN NACL 20-0.9 MEQ/L-% IV SOLN
INTRAVENOUS | Status: DC
  Filled 2023-10-12 (×2): qty 1000

## 2023-10-12 MED ORDER — ALBUTEROL SULFATE (2.5 MG/3ML) 0.083% IN NEBU: 2.5000 mg | INHALATION_SOLUTION | Freq: Four times a day (QID) | RESPIRATORY_TRACT | Status: DC | PRN

## 2023-10-12 MED ORDER — LACTATED RINGERS IV BOLUS: 1000.0000 mL | Freq: Once | INTRAVENOUS | Status: DC

## 2023-10-12 MED ORDER — ONDANSETRON HCL 4 MG/2ML IJ SOLN: 4.0000 mg | Freq: Four times a day (QID) | INTRAMUSCULAR | Status: DC | PRN

## 2023-10-12 NOTE — ED Triage Notes (Signed)
Patient arrives by EMS from home.   Patient lives in apartment alone, was found on the floor between the bed and the wall.   Patient was last seen on Tuesday when [facility] came to pick patient up for doctors appointment.  Patient was found today as [facility] was coming to transport patient to appointment.   Patient reports neck and back pain, arrives with c-collar in place.   Patient alert to self and place, states he fell 3 days ago.      EMS CBG 119

## 2023-10-12 NOTE — H&P (Signed)
History and Physical    [name] NFA:[id number] DOB: 1950-12-16 DOA: 10/12/2023  PCP: [name], MD  Patient coming from: Home  I have personally briefly reviewed patient's old medical records in [facility]  Chief Complaint: Fall at home  HPI: [name] is a 72 y.o. male with medical history significant for TBI with cognitive impairment, asthma, HTN, chronic venous stasis who presented to the ED from home after a fall with prolonged downtime.  Patient states he lives alone.  He says he tripped and fell 2 days ago.  He hit his head on the floor and was stuck between his bed and the wall.  [facility] follows with [facility] and when their transport team came to pick him up they found him on the ground.  Patient was complaining of neck and back pain.  EMS were called and he was brought to the ED for further evaluation.  ED Course  Labs/Imaging on admission: I have personally reviewed following labs and imaging studies.  Initial vitals showed BP 126/75, pulse 54, RR 20, temp 98.8 F, SpO2 98% on room air.  Labs showed CK 1104, WBC 7.8, hemoglobin 10.2, platelets 303,000, sodium 137, potassium 3.5, bicarb 24, BUN 10, creatinine 0.73, serum glucose 85, AST 51, ALT 27, alk phos 71, total bilirubin 0.8.  UA, uric acid, LDH pending collection.  CT head without contrast showed mild left parieto-occipital scalp hematoma.  No acute intracranial abnormality.  No skull fracture.  Atrophy, chronic small vessel ischemia, and remote infarcts noted.  CT cervical spine without contrast showed lobulated soft tissue masses/nodules in the left neck and supraclavicular region suspicious for adenopathy.  This is greater than typically seen with reactive changes since suspicious for metastatic disease or lymphoproliferative disorder such as lymphoma.  No acute fracture or subluxation of the C-spine.  CT chest/abdomen/pelvis with contrast was negative for evidence of acute traumatic injury in the  chest, abdomen, pelvis.  Findings consistent with neoplastic process/diffuse metastatic disease in the chest, abdomen, and pelvis noted.  Multifocal adenopathy as well as nodules, left subcutaneous and in the abdominopelvic cavity.  Solid lesions within the pancreas and left kidney, either which could be site of primary.  Left adrenal nodule typically metastatic.  Small right inguinal hernia contains nonobstructed small bowel.  Question of pericolonic edema about the descending colon.  Patient was given 1 L normal saline.  EDP discussed with oncology, Dr. Bertis Ruddy, who recommended admission for further workup including IR biopsy and oncology will see in consultation.  The hospitalist service was consulted to admit.  Review of Systems: All systems reviewed and are negative except as documented in history of present illness above.   Past Medical History:  Diagnosis Date   Alcohol abuse    Arthritis    Asthma    Brain tumor (HCC)    Bronchitis    GERD (gastroesophageal reflux disease)    Hypertension    Hypertensive retinopathy    OU   Obesity    Traumatic brain injury    Varicose veins     Past Surgical History:  Procedure Laterality Date   CATARACT EXTRACTION Bilateral 2018   Dr. Elmer Picker   EYE SURGERY Bilateral 2018   Cat Sx - Dr. Elmer Picker   HEMORRHOID SURGERY      Social History:  reports that he has never smoked. He has never used smokeless tobacco. He reports current alcohol use. He reports that he does not use drugs.  No Known Allergies  Family History  Problem Relation Age of Onset   Diabetes Father      Prior to Admission medications   Medication Sig Start Date End Date Taking? Authorizing Provider  acetaminophen (TYLENOL) 500 MG tablet Take 500 mg by mouth every 8 (eight) hours as needed for headache or mild pain. 06/14/15   [provider]  albuterol (VENTOLIN HFA) 108 (90 Base) MCG/ACT inhaler Inhale 1-2 puffs into the lungs every 6 (six) hours as needed for  wheezing or shortness of breath.    [provider]  ascorbic acid (VITAMIN C) 250 MG tablet Take 1 tablet (250 mg total) by mouth 2 (two) times daily. 10/25/21   [name] Salter, MD  atorvastatin (LIPITOR) 20 MG tablet Take 20 mg by mouth daily.    [provider]  budesonide-formoterol (SYMBICORT) 160-4.5 MCG/ACT inhaler Inhale 2 puffs into the lungs 2 (two) times daily.    [provider]  chlorhexidine (PERIDEX) 0.12 % solution Use as directed 15 mLs in the mouth or throat 2 (two) times daily.    [provider]  doxycycline (DORYX) 100 MG EC tablet Take 100 mg by mouth 2 (two) times daily. Started 10-19-21 for 14 days    [provider]  feeding supplement (ENSURE ENLIVE / ENSURE PLUS) LIQD Take 237 mLs by mouth 2 (two) times daily between meals. 10/26/21   [name] Salter, MD  guaiFENesin (MUCINEX) 600 MG 12 hr tablet Take 1 tablet (600 mg total) by mouth 2 (two) times daily. 10/25/21   [name] Salter, MD  hydrocortisone cream 1 % Apply 1 application topically See admin instructions. Apply thin layer to affected area(s) face daily as needed    [provider]  ketoconazole (NIZORAL) 2 % shampoo Apply 1 application topically 2 (two) times a week.    [provider]  loperamide (IMODIUM A-D) 2 MG tablet Take 2 mg by mouth daily as needed for diarrhea or loose stools. Do not exceed more than 8mg  or 4 tablets within 24 hours.    [provider]  mirabegron ER (MYRBETRIQ) 25 MG TB24 tablet Take 25 mg by mouth daily.    [provider]  Multiple Vitamin (MULTIVITAMIN WITH MINERALS) TABS tablet Take 1 tablet by mouth daily. 10/26/21   [name] Salter, MD  Sodium Fluoride (PREVIDENT 5000 BOOSTER PLUS) 1.1 % PSTE Place 1 application onto teeth at bedtime.    [provider]  tamsulosin (FLOMAX) 0.4 MG CAPS capsule Take 0.4 mg by mouth daily. 02/23/16   [provider]  traMADol (ULTRAM) 50 MG tablet Take 1  tablet (50 mg total) by mouth every 6 (six) hours as needed for moderate pain or severe pain. 10/25/21   [name] Salter, MD  Vitamin D, Ergocalciferol, (DRISDOL) 1.25 MG (50000 UNIT) CAPS capsule Take 50,000 Units by mouth every 30 (thirty) days.    [provider]    Physical Exam: Vitals:   10/12/23 1818 10/12/23 1900 10/12/23 1915 10/12/23 1945  BP: 112/74 134/65  117/66  Pulse: 90 (!) 56 69 (!) 50  Resp: 20 19 20 11   Temp: 98.3 F (36.8 C)     TempSrc: Oral   Oral  SpO2: 100% 100% 100% 100%   Constitutional: Resting in bed, NAD, calm, comfortable Eyes: EOMI, lids and conjunctivae normal ENMT: Mucous membranes are dry. Posterior pharynx clear of any exudate or lesions.Normal dentition.  Neck: normal, supple, no masses. Respiratory: clear to auscultation bilaterally, no wheezing, no crackles. Normal  respiratory effort. No accessory muscle use.  Cardiovascular: Regular rate and rhythm, no murmurs / rubs / gallops. No extremity edema. 2+ pedal pulses. Abdomen: no tenderness, no masses palpated.  Musculoskeletal: no clubbing / cyanosis. No joint deformity upper and lower extremities. Good ROM, no contractures. Normal muscle tone.  Skin: Stasis dermatitis changes bilateral lower extremities. No induration Neurologic: Sensation intact. Strength 5/5 in all 4.  Psychiatric: Alert and oriented x 3. Normal mood.   EKG: Not performed.  Assessment/Plan Principal Problem:   Metastatic disease (HCC) Active Problems:   HTN (hypertension)   Mild persistent asthma without complication   Fall at home, initial encounter   Elevated CK   [name] is a 72 y.o. male with medical history significant for TBI with cognitive impairment, asthma, HTN, chronic venous stasis who is admitted for evaluation of metastatic appearing disease on imaging.  Assessment and Plan: Metastatic appearing disease on CT imaging with unknown primary: Findings consistent with neoplastic process/diffuse  metastatic disease in the chest, abdomen, pelvis noted on CT.  This included multifocal adenopathy with nodules, both subcutaneous and in the abdominal pelvic cavity as well as left neck and supraclavicular region.  Solid lesions within the pancreas and left kidney, either could be site of primary.  Metastatic appearing left adrenal nodule noted. -Oncology to follow -IR consult for biopsy placed  Fall at home with prolonged downtime and elevated CK: Patient reports mechanical fall at home and on the ground for nearly 2 days.  CK mildly elevated at 1104.  Renal function intact.  Patient states he normally ambulates with walker at baseline. -Continue IV fluid hydration overnight -Repeat CK in a.m. -PT/OT eval, fall precautions -May need placement  Sinus bradycardia: Borderline bradycardia noted while in the ED, appears to be asymptomatic.  Will monitor on telemetry.  Asthma: Stable without wheezing.  Continue Dulera and albuterol as needed.  Hypertension: BP is stable.  Unclear if he is still on antihypertensives as an outpatient.   DVT prophylaxis: SCDs Start: 10/12/23 1948 Code Status: Full code Family Communication: Patient states he has no family. Disposition Plan: From home, dispo pending clinical progress Consults called: Oncology Severity of Illness: The appropriate patient status for this patient is INPATIENT. Inpatient status is judged to be reasonable and necessary in order to provide the required intensity of service to ensure the patient's safety. The patient's presenting symptoms, physical exam findings, and initial radiographic and laboratory data in the context of their chronic comorbidities is felt to place them at high risk for further clinical deterioration. Furthermore, it is not anticipated that the patient will be medically stable for discharge from the hospital within 2 midnights of admission.   * I certify that at the point of admission it is my clinical judgment that  the patient will require inpatient hospital care spanning beyond 2 midnights from the point of admission due to high intensity of service, high risk for further deterioration and high frequency of surveillance required.Darreld Mclean MD [facility]  If 7PM-7AM, please contact night-coverage [email or web address]  10/12/2023, 7:54 PM

## 2023-10-12 NOTE — Hospital Course (Signed)
[name] is a 72 y.o. male with medical history significant for TBI with cognitive impairment, asthma, HTN, chronic venous stasis who is admitted for evaluation of metastatic appearing disease on imaging.

## 2023-10-12 NOTE — ED Notes (Signed)
ED TO INPATIENT HANDOFF REPORT  ED Nurse Name and Phone #:  Theadora Rama RN [phone]  S Name/Age/Gender [name] 72 y.o. male Room/Bed: 024C/024C  Code Status   Code Status: Prior  Home/SNF/Other Home Patient oriented to: self and place Is this baseline? Yes   Triage Complete: Triage complete  Chief Complaint Fall at home, initial encounter [W19.[name], Y92.009]  Triage Note Patient arrives by EMS from home.   Patient lives in apartment alone, was found on the floor between the bed and the wall.   Patient was last seen on Tuesday when [facility] came to pick patient up for doctors appointment.  Patient was found today as [facility] was coming to transport patient to appointment.   Patient reports neck and back pain, arrives with c-collar in place.   Patient alert to self and place, states he fell 3 days ago.      EMS CBG 119   Allergies No Known Allergies  Level of Care/Admitting Diagnosis ED Disposition     ED Disposition  Admit   Condition  --   Comment  Hospital Area: [facility] [[id number]]  Level of Care: Telemetry Medical [104]  May admit patient to [facility] or [facility] if equivalent level of care is available:: No  Covid Evaluation: Asymptomatic - no recent exposure (last 10 days) testing not required  Diagnosis: Fall at home, initial encounter [phone]  Admitting Physician: Charlsie Quest [[id number]]  Attending Physician: Charlsie Quest [[id number]]  Certification:: I certify this patient will need inpatient services for at least 2 midnights          B Medical/Surgery History Past Medical History:  Diagnosis Date   Alcohol abuse    Arthritis    Asthma    Brain tumor (HCC)    Bronchitis    GERD (gastroesophageal reflux disease)    Hypertension    Hypertensive retinopathy    OU   Obesity    Traumatic brain injury    Varicose veins    Past Surgical History:  Procedure Laterality Date   CATARACT EXTRACTION Bilateral 2018   Dr. Elmer Picker    EYE SURGERY Bilateral 2018   Cat Sx - Dr. Elmer Picker   HEMORRHOID SURGERY       A IV Location/Drains/Wounds Patient Lines/Drains/Airways Status     Active Line/Drains/Airways     Name Placement date Placement time Site Days   Peripheral IV 10/12/23 18 G Left Antecubital 10/12/23  --  Antecubital  less than 1   External Urinary Catheter 10/23/21  --  --  719   Wound / Incision (Open or Dehisced) Venous stasis ulcer;Non-pressure wound Pretibial Right --  --  Pretibial  --            Intake/Output Last 24 hours No intake or output data in the 24 hours ending 10/12/23 1944  Labs/Imaging Results for orders placed or performed during the hospital encounter of 10/12/23 (from the past 48 hour(s))  Comprehensive metabolic panel     Status: Abnormal   Collection Time: 10/12/23  1:52 PM  Result Value Ref Range   Sodium 137 135 - 145 mmol/L   Potassium 3.5 3.5 - 5.1 mmol/L   Chloride 103 98 - 111 mmol/L   CO2 24 22 - 32 mmol/L   Glucose, Bld 85 70 - 99 mg/dL    Comment: Glucose reference range applies only to samples taken after fasting for at least 8 hours.   BUN 10 8 - 23  mg/dL   Creatinine, Ser 6.64 0.61 - 1.24 mg/dL   Calcium 8.9 8.9 - 40.3 mg/dL   Total Protein 6.2 (L) 6.5 - 8.1 g/dL   Albumin 3.1 (L) 3.5 - 5.0 g/dL   AST 51 (H) 15 - 41 U/L   ALT 27 0 - 44 U/L   Alkaline Phosphatase 71 38 - 126 U/L   Total Bilirubin 0.8 <1.2 mg/dL   GFR, Estimated >47 >42 mL/min    Comment: (NOTE) Calculated using the CKD-EPI Creatinine Equation (2021)    Anion gap 10 5 - 15    Comment: Performed at [facility], [address] [place], [place] [address]  CBC with Differential     Status: Abnormal   Collection Time: 10/12/23  1:52 PM  Result Value Ref Range   WBC 7.8 4.0 - 10.5 K/uL   RBC 4.24 4.22 - 5.81 MIL/uL   Hemoglobin 10.2 (L) 13.0 - 17.0 g/dL   HCT 87.5 (L) 64.3 - 32.9 %   MCV 77.8 (L) 80.0 - 100.0 fL   MCH 24.1 (L) 26.0 - 34.0 pg   MCHC 30.9 30.0 - 36.0 g/dL   RDW  51.8 (H) 84.1 - 15.5 %   Platelets 303 150 - 400 K/uL   nRBC 0.0 0.0 - 0.2 %   Neutrophils Relative % 80 %   Neutro Abs 6.3 1.7 - 7.7 K/uL   Lymphocytes Relative 11 %   Lymphs Abs 0.9 0.7 - 4.0 K/uL   Monocytes Relative 7 %   Monocytes Absolute 0.5 0.1 - 1.0 K/uL   Eosinophils Relative 1 %   Eosinophils Absolute 0.1 0.0 - 0.5 K/uL   Basophils Relative 1 %   Basophils Absolute 0.1 0.0 - 0.1 K/uL   Immature Granulocytes 0 %   Abs Immature Granulocytes 0.02 0.00 - 0.07 K/uL    Comment: Performed at [facility], [address] [place], [place] [address]  CK     Status: Abnormal   Collection Time: 10/12/23  1:52 PM  Result Value Ref Range   Total CK 1,104 (H) 49 - 397 U/L    Comment: Performed at [facility], [address] [place], [place] [address]   CT CHEST ABDOMEN PELVIS W CONTRAST  Result Date: 10/12/2023 CLINICAL DATA:  Blunt trauma.  Status post unwitnessed fall. EXAM: CT CHEST, ABDOMEN, AND PELVIS WITH CONTRAST TECHNIQUE: Multidetector CT imaging of the chest, abdomen and pelvis was performed following the standard protocol during bolus administration of intravenous contrast. RADIATION DOSE REDUCTION: This exam was performed according to the departmental dose-optimization program which includes automated exposure control, adjustment of the mA and/or kV according to patient size and/or use of iterative reconstruction technique. CONTRAST:  75mL OMNIPAQUE IOHEXOL 350 MG/ML SOLN COMPARISON:  Chest radiograph 10/22/2021. FINDINGS: CT CHEST FINDINGS Cardiovascular: No evidence of acute aortic or vascular injury. The heart is normal in size. No pericardial effusion. Mediastinum/Nodes: No evidence of mediastinal hemorrhage or hematoma. Enlarged right axillary node, 19 mm short axis series 3, image 17. Left supraclavicular soft tissue nodule measures 18 mm series 3, image 8. L non-specific 8 mm anterior mediastinal node series 3, image 35. Small hiatal hernia. Eft carotid node  measuring 14 mm series 3, image 12. Lungs/Pleura: No pneumothorax or pulmonary contusion. No pleural effusion. No pulmonary mass. Tiny punctate subpleural right lower lobe nodule series 4, image 112, nonspecific. Musculoskeletal: Scattered Schmorl's nodes in the thoracic spine. No acute thoracic fracture. No evidence of acute fracture  of the ribs, sternum, included clavicles or shoulder girdles. Suspect remote right proximal humerus fracture. Lobulated subcutaneous nodule posterior left upper arm/periscapular region measuring 2.8 cm series 3, image 28. Left lateral chest wall node/nodule measuring 12 mm series 3, image 39. CT ABDOMEN PELVIS FINDINGS Hepatobiliary: No hepatic injury or perihepatic hematoma. No evidence of focal liver lesion. Mild motion artifact and streak artifact from right arm down positioning. Gallbladder is unremarkable. Pancreas: No evidence of injury. 2.5 cm suspected mass in the pancreatic head series 3, image 69. This does not approach the superior mesenteric artery or vein on provided view. No peripancreatic inflammation. Spleen: No splenic injury or perisplenic hematoma. Adrenals/Urinary Tract: No adrenal hemorrhage. There is a heterogeneous 1.5 cm left adrenal nodule. No evidence of renal injury. Solid mass arising from the upper anterior left kidney measures 4 x 3.4 cm, series 3, image 66. No hydronephrosis. No bladder wall thickening Stomach/Bowel: No evidence of bowel injury or mesenteric hematoma. Moderate stool in the right colon. Question of pericolonic edema about the descending colon, series 3, image 91. Small right inguinal hernia contains nonobstructed small bowel. Normal appendix visualized. There is no small bowel dilatation or obvious small bowel mass. Small hiatal hernia. Vascular/Lymphatic: No evidence is acute vascular injury. No retroperitoneal fluid. Aortic atherosclerosis without aneurysm. Multifocal adenopathy in soft tissue nodularity throughout the abdomen and  pelvis. Representative nodule in the right upper quadrant measures 18 mm series 3, image 54, posterior to the liver and above the adrenal gland. There right perinephric nodules, series 3, image 67 and 68. Left perinephric nodule series 3, image 72. Left retroperitoneal adenopathy, node measuring 18 mm short axis at the level of the left kidney series 3, image 71. There is aortocaval adenopathy at similar level. Left lower quadrant nodule medial to the descending colon measuring 19 mm series 3, image 86. There additional scattered nodules and soft tissue deposits. 17 mm right inguinal node series 3, image 122. Additional enlarged right inguinal nodes and left inguinal adenopathy. There are left perirectal node series 3 images 111 and 113. Reproductive: Mildly prominent prostate. Other: Soft tissue nodule, for example series 3, image 103 in the right gluteal/flank region. Mild generalized stranding of the intra-abdominal fat which is nonspecific. No convincing ascites. No free air. Musculoskeletal: No evidence of acute fracture. Mild L1 compression deformity with superior endplate Schmorl's node has a chronic appearance. No evidence of focal bone lesion. IMPRESSION: 1. No evidence of acute traumatic injury in the chest, abdomen, or pelvis. 2. 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 Multifocal adenopathy as well as nodules, both subcutaneous and in the abdominopelvic cavity. Solid lesions are seen within the pancreas and left kidney, either of which could be site of primary. Recommend oncologic workup. 3. Left adrenal nodule typically metastatic. 4. Small right inguinal hernia contains nonobstructed small bowel. 5. Question of pericolonic edema about the descending colon, nonspecific. Aortic Atherosclerosis (ICD10-I70.0). Electronically Signed   By: Narda Rutherford M.D.   On: 10/12/2023 17:29   CT CERVICAL SPINE WO CONTRAST  Result Date:  10/12/2023 CLINICAL DATA:  Neck trauma (Age >= 65y) Unwitnessed fall. EXAM: CT CERVICAL SPINE WITHOUT CONTRAST TECHNIQUE: Multidetector CT imaging of the cervical spine was performed without intravenous contrast. Multiplanar CT image reconstructions were also generated. RADIATION DOSE REDUCTION: This exam was performed according to the departmental dose-optimization program which includes automated exposure control, adjustment of the mA and/or kV according to patient size and/or use of iterative reconstruction technique.  COMPARISON:  None Available. FINDINGS: Alignment: Normal. Skull base and vertebrae: No acute fracture. Vertebral body heights are maintained. The dens and skull base are intact. Soft tissues and spinal canal: No prevertebral fluid or swelling. No visible canal hematoma. Lobulated soft tissue mass in the left neck measures 2.4 x 2.3 cm, series 9, image 76. Smaller adjacent soft tissue deposit, series 9, image 70. 18 mm soft tissue deposit in the left supraclavicular space series 9, image 80. Disc levels: Mild anterior spurring at multiple levels with preservation of disc spaces. Mild scattered facet hypertrophy. Upper chest: Assessed on concurrent chest CT, reported separately. Other: Carotid calcifications. IMPRESSION: 1. No acute fracture or subluxation of the cervical spine. 2. Lobulated soft tissue masses/nodules in the left neck and supraclavicular region, suspicious for adenopathy. This is greater than typically seen with reactive changes. Findings are suspicious for metastatic disease or lymphoproliferative disorder such as lymphoma. Electronically Signed   By: Narda Rutherford M.D.   On: 10/12/2023 17:12   CT HEAD WO CONTRAST ( )  Result Date: 10/12/2023 CLINICAL DATA:  Head trauma, minor (Age >= 65y) Unwitnessed fall. EXAM: CT HEAD WITHOUT CONTRAST TECHNIQUE: Contiguous axial images were obtained from the base of the skull through the vertex without intravenous contrast. RADIATION  DOSE REDUCTION: This exam was performed according to the departmental dose-optimization program which includes automated exposure control, adjustment of the mA and/or kV according to patient size and/or use of iterative reconstruction technique. COMPARISON:  10/21/2021 FINDINGS: Brain: No evidence of acute infarction, hemorrhage, hydrocephalus, extra-axial collection or mass lesion/mass effect. Generalized atrophy. Again seen cavum septum pellucidum. Moderate to advanced periventricular and deep white matter hypodensity typical of chronic small vessel ischemia. Remote right frontal and left temporal infarcts. Vascular: Atherosclerosis of skullbase vasculature without hyperdense vessel or abnormal calcification. Skull: No fracture or focal lesion. Sinuses/Orbits: No acute findings. Surgical change in the right superior orbit. Mucosal thickening throughout the ethmoid air cells, chronic. No mastoid effusion. Other: Mild left parietooccipital scalp hematoma IMPRESSION: 1. Mild left parietooccipital scalp hematoma. No acute intracranial abnormality. No skull fracture. 2. Atrophy, chronic small vessel ischemia, and remote infarcts. Electronically Signed   By: Narda Rutherford M.D.   On: 10/12/2023 17:04    Pending Labs Unresulted Labs (From admission, onward)     Start     Ordered   10/12/23 1848  Lactate dehydrogenase  Once,   STAT        10/12/23 1847   10/12/23 1848  Uric acid  Once,   STAT        10/12/23 1847   10/12/23 1329  Urinalysis, Routine w reflex microscopic -Urine, Clean Catch  Once,   URGENT       Question:  Specimen Source  Answer:  Urine, Clean Catch   10/12/23 1329            Vitals/Pain Today's Vitals   10/12/23 1340 10/12/23 1342 10/12/23 1818  BP:   112/74  Pulse: 73  90  Resp: 14  20  Temp: 98.8 F (37.1 C)  98.3 F (36.8 C)  TempSrc: Axillary  Oral  SpO2: 99%  100%  PainSc:  10-Worst pain ever     Isolation Precautions No active  isolations  Medications Medications  0.9 % NaCl with KCl 20 mEq/ L  infusion (has no administration in time range)  sodium chloride 0.9 % bolus 1,000 mL (1,000 mLs Intravenous New Bag/Given 10/12/23 1615)  iohexol (OMNIPAQUE) 350 MG/ML injection 75 mL (75 mLs Intravenous Contrast  Given 10/12/23 1559)    Mobility non-ambulatory     Focused Assessments Cardiac Assessment Handoff:  Cardiac Rhythm: Sinus bradycardia Lab Results  Component Value Date   CKTOTAL 1,104 (H) 10/12/2023   No results found for: "DDIMER" Does the Patient currently have chest pain? No   , Pulmonary Assessment Handoff:  Lung sounds:   O2 Device: Room Air      R Recommendations: See Admitting Provider Note  Report given to:   Additional Notes:

## 2023-10-12 NOTE — ED Provider Notes (Signed)
  Physical Exam  BP 112/74 (BP Location: Right Arm)   Pulse 90   Temp 98.3 F (36.8 C) (Oral)   Resp 20   SpO2 100%   Physical Exam  Procedures  Procedures  ED Course / MDM    Medical Decision Making Care assumed at 3 pm.  Patient is found altered by caseworker.  Patient supposed to have a doctor's appointment today but was found on the floor.  CK level was 1000.  Signout pending trauma scan  6:42 PM Trauma scan showed no acute fractures or bleeding.  However patient likely has metastatic cancer.  Nurse tries to walk the patient and patient is unable to walk and very unsteady.  At this point patient will need to be admitted for lymphoma workup and also likely needs to be placed in a nursing home or rehab facility.  Patient currently lives at home. I talked to Dr. Bertis Ruddy from oncology. She will see patient and recommend IR biopsy. I tried to call DSS worker, Herbert Seta but she didn't pick up and I left a message. No family at bedside   Problems Addressed: Fall, initial encounter: acute illness or injury Lymphoma, unspecified body region, unspecified lymphoma type [facility]): acute illness or injury  Amount and/or Complexity of Data Reviewed Labs: ordered. Radiology: ordered.  Risk Prescription drug management.          Charlynne Pander, MD 10/12/23 [phone]

## 2023-10-12 NOTE — ED Provider Notes (Signed)
[place] EMERGENCY DEPARTMENT AT [facility] Provider Note  CSN: [id number] Arrival date & time: 10/12/23 1317  Chief Complaint(s) Fall  HPI [name] is a 72 y.o. male with PMH chronic venous stasis, TBI with cognitive impairment, HTN, asthma who presents emerged apartment for evaluation of a fall.  Patient currently following with [facility] who came to pick up the patient for doctor's appointment today.  He was last seen 4 days ago and has reportedly been on the ground since then.  He suffered an unwitnessed fall during that time.  Arrives with complaints of neck and back pain and arrives with c-collar in place.  Denies chest pain, shortness of breath, headache, vomiting or other systemic symptoms.   Past Medical History Past Medical History:  Diagnosis Date   Alcohol abuse    Arthritis    Asthma    Brain tumor (HCC)    Bronchitis    GERD (gastroesophageal reflux disease)    Hypertension    Hypertensive retinopathy    OU   Obesity    Traumatic brain injury    Varicose veins    Patient Active Problem List   Diagnosis Date Noted   Malnutrition of moderate degree 10/25/2021   Closed displaced fracture of middle phalanx of right ring finger 10/22/2021   Cellulitis of right lower leg with open wound 10/22/2021   Asthma, chronic, unspecified asthma severity, with acute exacerbation 10/22/2021   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 Authorizing Provider  acetaminophen (TYLENOL) 500 MG tablet Take 500  mg by mouth every 8 (eight) hours as needed for headache or mild pain. 06/14/15   [provider]  albuterol (VENTOLIN HFA) 108 (90 Base) MCG/ACT inhaler Inhale 1-2 puffs into the lungs every 6 (six) hours as needed for wheezing or shortness of breath.    [provider]  ascorbic acid (VITAMIN C) 250 MG tablet Take 1 tablet (250 mg total) by mouth 2 (two) times daily. 10/25/21   [name] Salter, MD  atorvastatin (LIPITOR) 20 MG tablet Take 20 mg by mouth daily.    [provider]  budesonide-formoterol (SYMBICORT) 160-4.5 MCG/ACT inhaler Inhale 2 puffs into the lungs 2 (two) times daily.    [provider]  chlorhexidine (PERIDEX) 0.12 % solution Use as directed 15 mLs in the mouth or throat 2 (two) times daily.    [provider]  doxycycline (DORYX) 100 MG EC tablet Take 100 mg by mouth 2 (two) times daily. Started 10-19-21 for 14 days    [provider]  feeding supplement (ENSURE ENLIVE / ENSURE PLUS) LIQD Take 237 mLs by mouth 2 (two) times daily between meals. 10/26/21   [name] Salter, MD  guaiFENesin (MUCINEX) 600 MG 12 hr tablet Take 1 tablet (600 mg total) by mouth 2 (two) times daily. 10/25/21   [name] Salter, MD  hydrocortisone cream 1 % Apply 1 application topically See admin instructions. Apply thin layer to affected area(s) face daily as needed    [provider]  ketoconazole (NIZORAL) 2 % shampoo Apply 1  application topically 2 (two) times a week.    [provider]  loperamide (IMODIUM A-D) 2 MG tablet Take 2 mg by mouth daily as needed for diarrhea or loose stools. Do not exceed more than 8mg  or 4 tablets within 24 hours.    [provider]  mirabegron ER (MYRBETRIQ) 25 MG TB24 tablet Take 25 mg by mouth daily.    [provider]  Multiple Vitamin (MULTIVITAMIN WITH MINERALS) TABS tablet Take 1 tablet by mouth daily. 10/26/21   [name] Salter, MD  Sodium Fluoride (PREVIDENT 5000 BOOSTER PLUS)  1.1 % PSTE Place 1 application onto teeth at bedtime.    [provider]  tamsulosin (FLOMAX) 0.4 MG CAPS capsule Take 0.4 mg by mouth daily. 02/23/16   [provider]  traMADol (ULTRAM) 50 MG tablet Take 1 tablet (50 mg total) by mouth every 6 (six) hours as needed for moderate pain or severe pain. 10/25/21   [name] Salter, MD  Vitamin D, Ergocalciferol, (DRISDOL) 1.25 MG (50000 UNIT) CAPS capsule Take 50,000 Units by mouth every 30 (thirty) days.    [provider]                                                                                                                                    Past Surgical History Past Surgical History:  Procedure Laterality Date   CATARACT EXTRACTION Bilateral 2018   Dr. Elmer Picker   EYE SURGERY Bilateral 2018   Cat Sx - Dr. Elmer Picker   HEMORRHOID SURGERY     Family History Family History  Problem Relation Age of Onset   Diabetes Father     Social History Social History   Tobacco Use   Smoking status: Never   Smokeless tobacco: Never  Vaping Use   Vaping status: Never Used  Substance Use Topics   Alcohol use: Yes   Drug use: No   Allergies Patient has no known allergies.  Review of Systems Review of Systems  Musculoskeletal:  Positive for back pain and neck pain.    Physical Exam Vital Signs  I have reviewed the triage vital signs BP 112/74 (BP Location: Right Arm)   Pulse 90   Temp 98.3 F (36.8 C) (Oral)   Resp 20   SpO2 100%   Physical Exam Vitals and nursing note reviewed.  Constitutional:      General: He is not in acute distress.    Appearance: He is well-developed.  HENT:     Head: Normocephalic and atraumatic.  Eyes:     Conjunctiva/sclera: Conjunctivae normal.  Cardiovascular:     Rate and Rhythm: Normal rate and regular rhythm.     Heart sounds: No murmur heard. Pulmonary:     Effort: Pulmonary effort is normal. No respiratory distress.     Breath sounds: Normal breath sounds.   Abdominal:     Palpations: Abdomen is soft.  Tenderness: There is no abdominal tenderness.  Musculoskeletal:        General: Tenderness present. No swelling.     Cervical back: Neck supple. Tenderness present.  Skin:    General: Skin is warm and dry.     Capillary Refill: Capillary refill takes less than 2 seconds.  Neurological:     Mental Status: He is alert.  Psychiatric:        Mood and Affect: Mood normal.     ED Results and Treatments Labs (all labs ordered are listed, but only abnormal results are displayed) Labs Reviewed  COMPREHENSIVE METABOLIC PANEL - Abnormal; Notable for the following components:      Result Value   Total Protein 6.2 (*)    Albumin 3.1 (*)    AST 51 (*)    All other components within normal limits  CBC WITH DIFFERENTIAL/PLATELET - Abnormal; Notable for the following components:   Hemoglobin 10.2 (*)    HCT 33.0 (*)    MCV 77.8 (*)    MCH 24.1 (*)    RDW 18.9 (*)    All other components within normal limits  CK - Abnormal; Notable for the following components:   Total CK 1,104 (*)    All other components within normal limits  URINALYSIS, ROUTINE W REFLEX MICROSCOPIC                                                                                                                          Radiology CT CHEST ABDOMEN PELVIS W CONTRAST  Result Date: 10/12/2023 CLINICAL DATA:  Blunt trauma.  Status post unwitnessed fall. EXAM: CT CHEST, ABDOMEN, AND PELVIS WITH CONTRAST TECHNIQUE: Multidetector CT imaging of the chest, abdomen and pelvis was performed following the standard protocol during bolus administration of intravenous contrast. RADIATION DOSE REDUCTION: This exam was performed according to the departmental dose-optimization program which includes automated exposure control, adjustment of the mA and/or kV according to patient size and/or use of iterative reconstruction technique. CONTRAST:  75mL OMNIPAQUE IOHEXOL 350 MG/ML SOLN COMPARISON:   Chest radiograph 10/22/2021. FINDINGS: CT CHEST FINDINGS Cardiovascular: No evidence of acute aortic or vascular injury. The heart is normal in size. No pericardial effusion. Mediastinum/Nodes: No evidence of mediastinal hemorrhage or hematoma. Enlarged right axillary node, 19 mm short axis series 3, image 17. Left supraclavicular soft tissue nodule measures 18 mm series 3, image 8. L non-specific 8 mm anterior mediastinal node series 3, image 35. Small hiatal hernia. Eft carotid node measuring 14 mm series 3, image 12. Lungs/Pleura: No pneumothorax or pulmonary contusion. No pleural effusion. No pulmonary mass. Tiny punctate subpleural right lower lobe nodule series 4, image 112, nonspecific. Musculoskeletal: Scattered Schmorl's nodes in the thoracic spine. No acute thoracic fracture. No evidence of acute fracture of the ribs, sternum, included clavicles or shoulder girdles. Suspect remote right proximal humerus fracture. Lobulated subcutaneous nodule posterior left upper arm/periscapular region measuring 2.8 cm series 3, image 28. Left lateral chest wall node/nodule measuring  12 mm series 3, image 39. CT ABDOMEN PELVIS FINDINGS Hepatobiliary: No hepatic injury or perihepatic hematoma. No evidence of focal liver lesion. Mild motion artifact and streak artifact from right arm down positioning. Gallbladder is unremarkable. Pancreas: No evidence of injury. 2.5 cm suspected mass in the pancreatic head series 3, image 69. This does not approach the superior mesenteric artery or vein on provided view. No peripancreatic inflammation. Spleen: No splenic injury or perisplenic hematoma. Adrenals/Urinary Tract: No adrenal hemorrhage. There is a heterogeneous 1.5 cm left adrenal nodule. No evidence of renal injury. Solid mass arising from the upper anterior left kidney measures 4 x 3.4 cm, series 3, image 66. No hydronephrosis. No bladder wall thickening Stomach/Bowel: No evidence of bowel injury or mesenteric hematoma.  Moderate stool in the right colon. Question of pericolonic edema about the descending colon, series 3, image 91. Small right inguinal hernia contains nonobstructed small bowel. Normal appendix visualized. There is no small bowel dilatation or obvious small bowel mass. Small hiatal hernia. Vascular/Lymphatic: No evidence is acute vascular injury. No retroperitoneal fluid. Aortic atherosclerosis without aneurysm. Multifocal adenopathy in soft tissue nodularity throughout the abdomen and pelvis. Representative nodule in the right upper quadrant measures 18 mm series 3, image 54, posterior to the liver and above the adrenal gland. There right perinephric nodules, series 3, image 67 and 68. Left perinephric nodule series 3, image 72. Left retroperitoneal adenopathy, node measuring 18 mm short axis at the level of the left kidney series 3, image 71. There is aortocaval adenopathy at similar level. Left lower quadrant nodule medial to the descending colon measuring 19 mm series 3, image 86. There additional scattered nodules and soft tissue deposits. 17 mm right inguinal node series 3, image 122. Additional enlarged right inguinal nodes and left inguinal adenopathy. There are left perirectal node series 3 images 111 and 113. Reproductive: Mildly prominent prostate. Other: Soft tissue nodule, for example series 3, image 103 in the right gluteal/flank region. Mild generalized stranding of the intra-abdominal fat which is nonspecific. No convincing ascites. No free air. Musculoskeletal: No evidence of acute fracture. Mild L1 compression deformity with superior endplate Schmorl's node has a chronic appearance. No evidence of focal bone lesion. IMPRESSION: 1. No evidence of acute traumatic injury in the chest, abdomen, or pelvis. 2. 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 Multifocal adenopathy as well as nodules, both subcutaneous and in the abdominopelvic  cavity. Solid lesions are seen within the pancreas and left kidney, either of which could be site of primary. Recommend oncologic workup. 3. Left adrenal nodule typically metastatic. 4. Small right inguinal hernia contains nonobstructed small bowel. 5. Question of pericolonic edema about the descending colon, nonspecific. Aortic Atherosclerosis (ICD10-I70.0). Electronically Signed   By: Narda Rutherford M.D.   On: 10/12/2023 17:29   CT CERVICAL SPINE WO CONTRAST  Result Date: 10/12/2023 CLINICAL DATA:  Neck trauma (Age >= 65y) Unwitnessed fall. EXAM: CT CERVICAL SPINE WITHOUT CONTRAST TECHNIQUE: Multidetector CT imaging of the cervical spine was performed without intravenous contrast. Multiplanar CT image reconstructions were also generated. RADIATION DOSE REDUCTION: This exam was performed according to the departmental dose-optimization program which includes automated exposure control, adjustment of the mA and/or kV according to patient size and/or use of iterative reconstruction technique. COMPARISON:  None Available. FINDINGS: Alignment: Normal. Skull base and vertebrae: No acute fracture. Vertebral body heights are maintained. The dens and skull base are intact. Soft tissues and spinal canal: No prevertebral fluid or swelling.  No visible canal hematoma. Lobulated soft tissue mass in the left neck measures 2.4 x 2.3 cm, series 9, image 76. Smaller adjacent soft tissue deposit, series 9, image 70. 18 mm soft tissue deposit in the left supraclavicular space series 9, image 80. Disc levels: Mild anterior spurring at multiple levels with preservation of disc spaces. Mild scattered facet hypertrophy. Upper chest: Assessed on concurrent chest CT, reported separately. Other: Carotid calcifications. IMPRESSION: 1. No acute fracture or subluxation of the cervical spine. 2. Lobulated soft tissue masses/nodules in the left neck and supraclavicular region, suspicious for adenopathy. This is greater than typically seen  with reactive changes. Findings are suspicious for metastatic disease or lymphoproliferative disorder such as lymphoma. Electronically Signed   By: Narda Rutherford M.D.   On: 10/12/2023 17:12   CT HEAD WO CONTRAST ( )  Result Date: 10/12/2023 CLINICAL DATA:  Head trauma, minor (Age >= 65y) Unwitnessed fall. EXAM: CT HEAD WITHOUT CONTRAST TECHNIQUE: Contiguous axial images were obtained from the base of the skull through the vertex without intravenous contrast. RADIATION DOSE REDUCTION: This exam was performed according to the departmental dose-optimization program which includes automated exposure control, adjustment of the mA and/or kV according to patient size and/or use of iterative reconstruction technique. COMPARISON:  10/21/2021 FINDINGS: Brain: No evidence of acute infarction, hemorrhage, hydrocephalus, extra-axial collection or mass lesion/mass effect. Generalized atrophy. Again seen cavum septum pellucidum. Moderate to advanced periventricular and deep white matter hypodensity typical of chronic small vessel ischemia. Remote right frontal and left temporal infarcts. Vascular: Atherosclerosis of skullbase vasculature without hyperdense vessel or abnormal calcification. Skull: No fracture or focal lesion. Sinuses/Orbits: No acute findings. Surgical change in the right superior orbit. Mucosal thickening throughout the ethmoid air cells, chronic. No mastoid effusion. Other: Mild left parietooccipital scalp hematoma IMPRESSION: 1. Mild left parietooccipital scalp hematoma. No acute intracranial abnormality. No skull fracture. 2. Atrophy, chronic small vessel ischemia, and remote infarcts. Electronically Signed   By: Narda Rutherford M.D.   On: 10/12/2023 17:04    Pertinent labs & imaging results that were available during my care of the patient were reviewed by me and considered in my medical decision making (see MDM for details).  Medications Ordered in ED Medications  lactated ringers bolus 1,000  mL (has no administration in time range)  sodium chloride 0.9 % bolus 1,000 mL (has no administration in time range)  iohexol (OMNIPAQUE) 350 MG/ML injection 75 mL (75 mLs Intravenous Contrast Given 10/12/23 1559)                                                                                                                                     Procedures Procedures  (including critical care time)  Medical Decision Making / ED Course   This patient presents to the ED for concern of fall, this involves an extensive number of treatment options, and is a complaint that carries with it a high risk  of complications and morbidity.  The differential diagnosis includes fracture, contusion, hematoma, ligamentous injury, closed head injury, ICH, laceration, intrathoracic injury, intra-abdominal injury  MDM: Patient seen emergency room for evaluation of a fall.  Physical exam with tenderness in the L-spine and in the C-spine, chronic lower extremity rash to the lower extremities.  Laboratory evaluation with a hemoglobin of 10.2 with an MCV of 77.8, AST 51, total albumin 3.1, CK 1104.  At time of signout, patient pending CT imaging.  Please see provider signout note for continuation of workup.   Additional history obtained:  -External records from outside source obtained and reviewed including: Chart review including previous notes, labs, imaging, consultation notes   Lab Tests: -I ordered, reviewed, and interpreted labs.   The pertinent results include:   Labs Reviewed  COMPREHENSIVE METABOLIC PANEL - Abnormal; Notable for the following components:      Result Value   Total Protein 6.2 (*)    Albumin 3.1 (*)    AST 51 (*)    All other components within normal limits  CBC WITH DIFFERENTIAL/PLATELET - Abnormal; Notable for the following components:   Hemoglobin 10.2 (*)    HCT 33.0 (*)    MCV 77.8 (*)    MCH 24.1 (*)    RDW 18.9 (*)    All other components within normal limits  CK -  Abnormal; Notable for the following components:   Total CK 1,104 (*)    All other components within normal limits  URINALYSIS, ROUTINE W REFLEX MICROSCOPIC        Imaging Studies ordered: I ordered imaging studies including CT head, C-spine, chest abdomen pelvis and this is pending   Medicines ordered and prescription drug management: Meds ordered this encounter  Medications   lactated ringers bolus 1,000 mL   sodium chloride 0.9 % bolus 1,000 mL   iohexol (OMNIPAQUE) 350 MG/ML injection 75 mL    -I have reviewed the patients home medicines and have made adjustments as needed  Critical interventions none    Cardiac Monitoring: The patient was maintained on a cardiac monitor.  I personally viewed and interpreted the cardiac monitored which showed an underlying rhythm of: NSR  Social Determinants of Health:  Factors impacting patients care include: none   Reevaluation: After the interventions noted above, I reevaluated the patient and found that they have :stayed the same  Co morbidities that complicate the patient evaluation  Past Medical History:  Diagnosis Date   Alcohol abuse    Arthritis    Asthma    Brain tumor (HCC)    Bronchitis    GERD (gastroesophageal reflux disease)    Hypertension    Hypertensive retinopathy    OU   Obesity    Traumatic brain injury    Varicose veins       Dispostion: I considered admission for this patient, and disposition pending completion of imaging studies.  Please see provider signout for continuation of workup.     Final Clinical Impression(s) / ED Diagnoses Final diagnoses:  None     @PCDICTATION @    Glendora Score, MD 10/12/23 1842

## 2023-10-12 NOTE — ED Notes (Signed)
Patient transported to CT 

## 2023-10-13 ENCOUNTER — Other Ambulatory Visit (HOSPITAL_COMMUNITY): Payer: Medicare (Managed Care)

## 2023-10-13 DIAGNOSIS — C778 Secondary and unspecified malignant neoplasm of lymph nodes of multiple regions: Secondary | ICD-10-CM | POA: Diagnosis not present

## 2023-10-13 DIAGNOSIS — R591 Generalized enlarged lymph nodes: Secondary | ICD-10-CM | POA: Diagnosis not present

## 2023-10-13 DIAGNOSIS — K8689 Other specified diseases of pancreas: Secondary | ICD-10-CM

## 2023-10-13 DIAGNOSIS — T796XXA Traumatic ischemia of muscle, initial encounter: Secondary | ICD-10-CM

## 2023-10-13 DIAGNOSIS — N2889 Other specified disorders of kidney and ureter: Secondary | ICD-10-CM | POA: Diagnosis not present

## 2023-10-13 LAB — GLUCOSE, CAPILLARY
Glucose-Capillary: 66 mg/dL — ABNORMAL LOW (ref 70–99)
Glucose-Capillary: 155 mg/dL — ABNORMAL HIGH (ref 70–99)

## 2023-10-13 LAB — BASIC METABOLIC PANEL
Anion gap: 8 (ref 5–15)
BUN: 10 mg/dL (ref 8–23)
CO2: 23 mmol/L (ref 22–32)
Calcium: 8.2 mg/dL — ABNORMAL LOW (ref 8.9–10.3)
Chloride: 106 mmol/L (ref 98–111)
Creatinine, Ser: 0.76 mg/dL (ref 0.61–1.24)
GFR, Estimated: >60 mL/min (ref >60)
Glucose, Bld: 119 mg/dL — ABNORMAL HIGH (ref 70–99)
Potassium: 3.6 mmol/L (ref 3.5–5.1)
Sodium: 137 mmol/L (ref 135–145)

## 2023-10-13 LAB — CBC
HCT: 28.3 % — ABNORMAL LOW (ref 39.0–52.0)
Hemoglobin: 8.5 g/dL — ABNORMAL LOW (ref 13.0–17.0)
MCH: 23.3 pg — ABNORMAL LOW (ref 26.0–34.0)
MCHC: 30 g/dL (ref 30.0–36.0)
MCV: 77.5 fL — ABNORMAL LOW (ref 80.0–100.0)
Platelets: 248 10*3/uL (ref 150–400)
RBC: 3.65 MIL/uL — ABNORMAL LOW (ref 4.22–5.81)
RDW: 19.2 % — ABNORMAL HIGH (ref 11.5–15.5)
WBC: 5.4 10*3/uL (ref 4.0–10.5)
nRBC: 0 % (ref 0.0–0.2)

## 2023-10-13 LAB — CK: Total CK: 665 U/L — ABNORMAL HIGH (ref 49–397)

## 2023-10-13 MED ORDER — ORAL CARE MOUTH RINSE
15.0000 mL | OROMUCOSAL | Status: DC
  Administered 2023-10-13 – 2023-10-19 (×26): 15 mL via OROMUCOSAL

## 2023-10-13 MED ORDER — ORAL CARE MOUTH RINSE: 15.0000 mL | OROMUCOSAL | Status: DC | PRN

## 2023-10-13 MED ORDER — KCL-LACTATED RINGERS-D5W 20 MEQ/L IV SOLN
INTRAVENOUS | Status: DC
  Filled 2023-10-13: qty 1000

## 2023-10-13 MED ORDER — DEXTROSE 50 % IV SOLN
25.0000 g | Freq: Once | INTRAVENOUS | Status: AC
  Administered 2023-10-13: 25 g via INTRAVENOUS
  Filled 2023-10-13: qty 50
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
## 2023-10-13 NOTE — Assessment & Plan Note (Signed)
10-13-2023 stable. Continue inhalers. 10-14-2023 stable. 10-15-2023 stable. Continue inhalers. 10-16-2023 stable.

## 2023-10-13 NOTE — Progress Notes (Signed)
PROGRESS NOTE    [name]  WUX:[id number] DOB: 19-Aug-1951 DOA: 10/12/2023 PCP: [name], MD  Subjective: Pt seen and examined. No family or guardian at bedside. Arousable but not awake.   Hospital Course: HPI: [name] is a 72 y.o. male with medical history significant for TBI with cognitive impairment, asthma, HTN, chronic venous stasis who presented to the ED from home after a fall with prolonged downtime.   Patient states he lives alone.  He says he tripped and fell 2 days ago.  He hit his head on the floor and was stuck between his bed and the wall.  He follows with [facility] and when their transport team came to pick him up they found him on the ground.  Patient was complaining of neck and back pain.  EMS were called and he was brought to the ED for further evaluation.   ED Course  Labs/Imaging on admission: I have personally reviewed following labs and imaging studies.   Initial vitals showed BP 126/75, pulse 54, RR 20, temp 98.8 F, SpO2 98% on room air.   Labs showed CK 1104, WBC 7.8, hemoglobin 10.2, platelets 303,000, sodium 137, potassium 3.5, bicarb 24, BUN 10, creatinine 0.73, serum glucose 85, AST 51, ALT 27, alk phos 71, total bilirubin 0.8.  UA, uric acid, LDH pending collection.   CT head without contrast showed mild left parieto-occipital scalp hematoma.  No acute intracranial abnormality.  No skull fracture.  Atrophy, chronic small vessel ischemia, and remote infarcts noted.   CT cervical spine without contrast showed lobulated soft tissue masses/nodules in the left neck and supraclavicular region suspicious for adenopathy.  This is greater than typically seen with reactive changes since suspicious for metastatic disease or lymphoproliferative disorder such as lymphoma.  No acute fracture or subluxation of the C-spine.   CT chest/abdomen/pelvis with contrast was negative for evidence of acute traumatic injury in the chest, abdomen, pelvis.  Findings  consistent with neoplastic process/diffuse metastatic disease in the chest, abdomen, and pelvis noted.  Multifocal adenopathy as well as nodules, left subcutaneous and in the abdominopelvic cavity.  Solid lesions within the pancreas and left kidney, either which could be site of primary.  Left adrenal nodule typically metastatic.  Small right inguinal hernia contains nonobstructed small bowel.  Question of pericolonic edema about the descending colon.   Patient was given 1 L normal saline.  EDP discussed with oncology, Dr. Bertis Ruddy, who recommended admission for further workup including IR biopsy and oncology will see in consultation.  The hospitalist service was consulted to admit.    Significant Events: Admitted 10/12/2023 for unknown metastatic disease on CT imaging with unknown primary   Significant Labs: Admission WBC 7.8, CK 1104, LDH 259, HgB 10.2, uric acid 6.9  Significant Imaging Studies: Admission CT head shows Mild left parietooccipital scalp hematoma  Admission CT c-spine shows No acute fracture or subluxation of the cervical spine. 2. Lobulated soft tissue masses/nodules in the left neck and supraclavicular region, suspicious for adenopathy. This is greater than typically seen with reactive changes. Findings are suspicious for metastatic disease or lymphoproliferative disorder such as lymphoma. Admission CT chest/abd/pelvis shows No evidence of acute traumatic injury in the chest, abdomen, or pelvis. 2. 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 Multifocal adenopathy as well as nodules, both subcutaneous and in the abdominopelvic cavity. Solid lesions are seen within the pancreas and left kidney, either of which could be site of primary. Recommend  oncologic workup. 3. Left adrenal nodule typically metastatic. 4. Small right inguinal hernia contains nonobstructed small bowel. 5. Question of pericolonic edema about the  descending colon, nonspecific.  Antibiotic Therapy: Anti-infectives (From admission, onward)    None       Procedures:   Consultants: Oncology IR    Assessment and Plan: * Metastatic disease (HCC) 10-13-2023 heme/onc consulted. Will need tissue biopsy. Pt with enlarged left supraclavicular lymph node on exam. This may be the most accessible place to biopsy. Defer to heme/onc on diagnostic workup.  Lymphadenopathy, generalized 10-13-2023 heme/onc consulted. Will need tissue biopsy. Pt with enlarged left supraclavicular lymph node on exam. This may be the most accessible place to biopsy. Defer to heme/onc on diagnostic workup.  Left kidney mass 10-13-2023 heme/onc consulted. Will need tissue biopsy. Pt with enlarged left supraclavicular lymph node on exam. This may be the most accessible place to biopsy. Defer to heme/onc on diagnostic workup.  Pancreatic mass 10-13-2023 heme/onc consulted. Will need tissue biopsy. Pt with enlarged left supraclavicular lymph node on exam. This may be the most accessible place to biopsy. Defer to heme/onc on diagnostic workup.  Traumatic rhabdomyolysis (HCC) 10-13-2023 due to his fall and prolonged downtime. Initial CK 1104. Today CK down to 665 already. No evidence of renal failure.  Fall at home, initial encounter 10-13-2023 will need PT consult eventually.  Mild persistent asthma without complication 10-13-2023 stable. Continue inhalers.  HTN (hypertension) 10-13-2023 hold BP meds for now.  TBI (traumatic brain injury) (HCC) 10-13-2023 chronic. Pt has DSS guardian who is not at bedside. Reportedly this is [employer].       DVT prophylaxis: SCDs Start: 10/12/23 1948     Code Status: Full Code Family Communication: no family or guardian at bedside Disposition Plan: return home vs SNF Reason for continuing need for hospitalization: needs metastatic/oncology workup.  Objective: Vitals:   10/12/23 2358 10/13/23 0447 10/13/23  0721 10/13/23 0830  BP: (!) 119/57 117/60 112/68   Pulse: 69 71 79   Resp: 18 18 17    Temp: 98.9 F (37.2 C) 98.1 F (36.7 C) 97.9 F (36.6 C)   TempSrc: Oral Oral Oral   SpO2: 100% 97% 98% 96%  Weight:      Height:        Intake/Output Summary (Last 24 hours) at 10/13/2023 0951 Last data filed at 10/13/2023 0814 Gross per 24 hour  Intake 923.44 ml  Output 500 ml  Net 423.44 ml   Filed Weights   10/12/23 2043  Weight: 67.1 kg    Examination:  Physical Exam Vitals and nursing note reviewed.  Constitutional:      Comments: Arousable but not awake  HENT:     Head: Atraumatic.  Cardiovascular:     Rate and Rhythm: Normal rate and regular rhythm.  Pulmonary:     Effort: Pulmonary effort is normal.  Abdominal:     General: Bowel sounds are normal.     Palpations: Abdomen is soft.  Musculoskeletal:     Right lower leg: No edema.     Left lower leg: No edema.  Lymphadenopathy:     Upper Body:     Left upper body: Supraclavicular adenopathy present.     Comments: +left supraclavicular lymphadenopathy  Skin:    General: Skin is warm and dry.     Capillary Refill: Capillary refill takes less than 2 seconds.  Neurological:     Mental Status: He is disoriented.     Data Reviewed: I have personally  reviewed following labs and imaging studies  CBC: Recent Labs  Lab 10/12/23 1352 10/13/23 0441  WBC 7.8 5.4  NEUTROABS 6.3  --   HGB 10.2* 8.5*  HCT 33.0* 28.3*  MCV 77.8* 77.5*  PLT 303 248   Basic Metabolic Panel: Recent Labs  Lab 10/12/23 1352 10/13/23 0441  NA 137 137  K 3.5 3.6  CL 103 106  CO2 24 23  GLUCOSE 85 119*  BUN 10 10  CREATININE 0.73 0.76  CALCIUM 8.9 8.2*   GFR: Estimated Creatinine Clearance: 80.4 mL/min (by C-G formula based on SCr of 0.76 mg/dL). Liver Function Tests: Recent Labs  Lab 10/12/23 1352  AST 51*  ALT 27  ALKPHOS 71  BILITOT 0.8  PROT 6.2*  ALBUMIN 3.1*   Cardiac Enzymes: Recent Labs  Lab 10/12/23 1352  10/13/23 0441  CKTOTAL 1,104* 665*   CBG: Recent Labs  Lab 10/13/23 0207 10/13/23 0256  GLUCAP 66* 155*    Radiology Studies: CT CHEST ABDOMEN PELVIS W CONTRAST  Result Date: 10/12/2023 CLINICAL DATA:  Blunt trauma.  Status post unwitnessed fall. EXAM: CT CHEST, ABDOMEN, AND PELVIS WITH CONTRAST TECHNIQUE: Multidetector CT imaging of the chest, abdomen and pelvis was performed following the standard protocol during bolus administration of intravenous contrast. RADIATION DOSE REDUCTION: This exam was performed according to the departmental dose-optimization program which includes automated exposure control, adjustment of the mA and/or kV according to patient size and/or use of iterative reconstruction technique. CONTRAST:  75mL OMNIPAQUE IOHEXOL 350 MG/ML SOLN COMPARISON:  Chest radiograph 10/22/2021. FINDINGS: CT CHEST FINDINGS Cardiovascular: No evidence of acute aortic or vascular injury. The heart is normal in size. No pericardial effusion. Mediastinum/Nodes: No evidence of mediastinal hemorrhage or hematoma. Enlarged right axillary node, 19 mm short axis series 3, image 17. Left supraclavicular soft tissue nodule measures 18 mm series 3, image 8. L non-specific 8 mm anterior mediastinal node series 3, image 35. Small hiatal hernia. Eft carotid node measuring 14 mm series 3, image 12. Lungs/Pleura: No pneumothorax or pulmonary contusion. No pleural effusion. No pulmonary mass. Tiny punctate subpleural right lower lobe nodule series 4, image 112, nonspecific. Musculoskeletal: Scattered Schmorl's nodes in the thoracic spine. No acute thoracic fracture. No evidence of acute fracture of the ribs, sternum, included clavicles or shoulder girdles. Suspect remote right proximal humerus fracture. Lobulated subcutaneous nodule posterior left upper arm/periscapular region measuring 2.8 cm series 3, image 28. Left lateral chest wall node/nodule measuring 12 mm series 3, image 39. CT ABDOMEN PELVIS FINDINGS  Hepatobiliary: No hepatic injury or perihepatic hematoma. No evidence of focal liver lesion. Mild motion artifact and streak artifact from right arm down positioning. Gallbladder is unremarkable. Pancreas: No evidence of injury. 2.5 cm suspected mass in the pancreatic head series 3, image 69. This does not approach the superior mesenteric artery or vein on provided view. No peripancreatic inflammation. Spleen: No splenic injury or perisplenic hematoma. Adrenals/Urinary Tract: No adrenal hemorrhage. There is a heterogeneous 1.5 cm left adrenal nodule. No evidence of renal injury. Solid mass arising from the upper anterior left kidney measures 4 x 3.4 cm, series 3, image 66. No hydronephrosis. No bladder wall thickening Stomach/Bowel: No evidence of bowel injury or mesenteric hematoma. Moderate stool in the right colon. Question of pericolonic edema about the descending colon, series 3, image 91. Small right inguinal hernia contains nonobstructed small bowel. Normal appendix visualized. There is no small bowel dilatation or obvious small bowel mass. Small hiatal hernia. Vascular/Lymphatic: No evidence is acute vascular injury.  No retroperitoneal fluid. Aortic atherosclerosis without aneurysm. Multifocal adenopathy in soft tissue nodularity throughout the abdomen and pelvis. Representative nodule in the right upper quadrant measures 18 mm series 3, image 54, posterior to the liver and above the adrenal gland. There right perinephric nodules, series 3, image 67 and 68. Left perinephric nodule series 3, image 72. Left retroperitoneal adenopathy, node measuring 18 mm short axis at the level of the left kidney series 3, image 71. There is aortocaval adenopathy at similar level. Left lower quadrant nodule medial to the descending colon measuring 19 mm series 3, image 86. There additional scattered nodules and soft tissue deposits. 17 mm right inguinal node series 3, image 122. Additional enlarged right inguinal nodes and  left inguinal adenopathy. There are left perirectal node series 3 images 111 and 113. Reproductive: Mildly prominent prostate. Other: Soft tissue nodule, for example series 3, image 103 in the right gluteal/flank region. Mild generalized stranding of the intra-abdominal fat which is nonspecific. No convincing ascites. No free air. Musculoskeletal: No evidence of acute fracture. Mild L1 compression deformity with superior endplate Schmorl's node has a chronic appearance. No evidence of focal bone lesion. IMPRESSION: 1. No evidence of acute traumatic injury in the chest, abdomen, or pelvis. 2. 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 Multifocal adenopathy as well as nodules, both subcutaneous and in the abdominopelvic cavity. Solid lesions are seen within the pancreas and left kidney, either of which could be site of primary. Recommend oncologic workup. 3. Left adrenal nodule typically metastatic. 4. Small right inguinal hernia contains nonobstructed small bowel. 5. Question of pericolonic edema about the descending colon, nonspecific. Aortic Atherosclerosis (ICD10-I70.0). Electronically Signed   By: Narda Rutherford M.D.   On: 10/12/2023 17:29   CT CERVICAL SPINE WO CONTRAST  Result Date: 10/12/2023 CLINICAL DATA:  Neck trauma (Age >= 65y) Unwitnessed fall. EXAM: CT CERVICAL SPINE WITHOUT CONTRAST TECHNIQUE: Multidetector CT imaging of the cervical spine was performed without intravenous contrast. Multiplanar CT image reconstructions were also generated. RADIATION DOSE REDUCTION: This exam was performed according to the departmental dose-optimization program which includes automated exposure control, adjustment of the mA and/or kV according to patient size and/or use of iterative reconstruction technique. COMPARISON:  None Available. FINDINGS: Alignment: Normal. Skull base and vertebrae: No acute fracture. Vertebral body heights are maintained. The  dens and skull base are intact. Soft tissues and spinal canal: No prevertebral fluid or swelling. No visible canal hematoma. Lobulated soft tissue mass in the left neck measures 2.4 x 2.3 cm, series 9, image 76. Smaller adjacent soft tissue deposit, series 9, image 70. 18 mm soft tissue deposit in the left supraclavicular space series 9, image 80. Disc levels: Mild anterior spurring at multiple levels with preservation of disc spaces. Mild scattered facet hypertrophy. Upper chest: Assessed on concurrent chest CT, reported separately. Other: Carotid calcifications. IMPRESSION: 1. No acute fracture or subluxation of the cervical spine. 2. Lobulated soft tissue masses/nodules in the left neck and supraclavicular region, suspicious for adenopathy. This is greater than typically seen with reactive changes. Findings are suspicious for metastatic disease or lymphoproliferative disorder such as lymphoma. Electronically Signed   By: Narda Rutherford M.D.   On: 10/12/2023 17:12   CT HEAD WO CONTRAST ( )  Result Date: 10/12/2023 CLINICAL DATA:  Head trauma, minor (Age >= 65y) Unwitnessed fall. EXAM: CT HEAD WITHOUT CONTRAST TECHNIQUE: Contiguous axial images were obtained from the base of the skull through the vertex without intravenous contrast.  RADIATION DOSE REDUCTION: This exam was performed according to the departmental dose-optimization program which includes automated exposure control, adjustment of the mA and/or kV according to patient size and/or use of iterative reconstruction technique. COMPARISON:  10/21/2021 FINDINGS: Brain: No evidence of acute infarction, hemorrhage, hydrocephalus, extra-axial collection or mass lesion/mass effect. Generalized atrophy. Again seen cavum septum pellucidum. Moderate to advanced periventricular and deep white matter hypodensity typical of chronic small vessel ischemia. Remote right frontal and left temporal infarcts. Vascular: Atherosclerosis of skullbase vasculature without  hyperdense vessel or abnormal calcification. Skull: No fracture or focal lesion. Sinuses/Orbits: No acute findings. Surgical change in the right superior orbit. Mucosal thickening throughout the ethmoid air cells, chronic. No mastoid effusion. Other: Mild left parietooccipital scalp hematoma IMPRESSION: 1. Mild left parietooccipital scalp hematoma. No acute intracranial abnormality. No skull fracture. 2. Atrophy, chronic small vessel ischemia, and remote infarcts. Electronically Signed   By: Narda Rutherford M.D.   On: 10/12/2023 17:04    Scheduled Meds:  mometasone-formoterol  2 puff Inhalation BID   mouth rinse  15 mL Mouth Rinse 4 times per day   Continuous Infusions:  dextrose 5% lactated ringers with KCl 20 mEq/L 40 mL/hr at 10/13/23 0905     LOS: 1 day   Time spent: 45 minutes  Carollee Herter, DO  [facility]  10/13/2023, 9:51 AM

## 2023-10-13 NOTE — Assessment & Plan Note (Signed)
10-13-2023 heme/onc consulted. Will need tissue biopsy. Pt with enlarged left supraclavicular lymph node on exam. This may be the most accessible place to biopsy. Defer to heme/onc on diagnostic workup.  10-14-2023 LN biopsy tomorrow.  10-15-2023 LN biopsy today.  10-16-2023 LN biopsy yesterday. Pathology pending.      Lab Results  Component Value Date/Time    CEA1 3.9 10/14/2023 05:19 AM    [id number] 5 10/14/2023 05:19 AM

## 2023-10-13 NOTE — Subjective & Objective (Signed)
Pt seen and examined. Stable overnight. Had LN biopsy yesterday. Awaiting pathology. CEA 3.9(negative) CA 19-9  of 5 (negative)  Will need SNF at discharge. Pt has [employer] legal guardian.

## 2023-10-13 NOTE — Assessment & Plan Note (Signed)
10-13-2023 heme/onc consulted. Will need tissue biopsy. Pt with enlarged left supraclavicular lymph node on exam. This may be the most accessible place to biopsy. Defer to heme/onc on diagnostic workup. 10-14-2023 plan for lymph node biopsy in AM. Will start D5 LR @ midnight to prevent any hypoglycemia that he had while he was NPO.  10-15-2023 LN biopsy today. Pt can have regular diet after biopsy and IVF stopped after biopsy.  10-16-2023 LN biopsy yesterday. Pathology pending. Lab Results  Component Value Date/Time   CEA1 3.9 10/14/2023 05:19 AM   [id number] 5 10/14/2023 05:19 AM

## 2023-10-13 NOTE — Plan of Care (Signed)

## 2023-10-13 NOTE — Evaluation (Signed)
Occupational Therapy Evaluation Patient Details Name: [name] MRN: [id number] DOB: 05/06/1951 Today's Date: 10/13/2023   History of Present Illness 72 y.o. male presented 11/22 to the ED from home after a fall with prolonged downtime. Imaging with metastatic disease. PMHx: TBI with cognitive impairment, asthma, HTN, chronic venous stasis.   Clinical Impression   Pt admitted for concerns listed above. PTA pt reported that he was independent with basic ADL's and had PACE coming out weekly to assist in higher level IADL's. At this time he presents with increased weakness and balance deficits, limiting his ability to complete ADL's and functional mobility with mod-max assist. Recommending Skilled OT to maximize his independence and safety, with acute OT continuing to follow while admitted in the hospital.       If plan is discharge home, recommend the following: A lot of help with walking and/or transfers;A lot of help with bathing/dressing/bathroom;Assistance with cooking/housework;Direct supervision/assist for medications management    Functional Status Assessment  Patient has had a recent decline in their functional status and demonstrates the ability to make significant improvements in function in a reasonable and predictable amount of time.  Equipment Recommendations  None recommended by OT    Recommendations for Other Services       Precautions / Restrictions Precautions Precautions: Fall Restrictions Weight Bearing Restrictions: No      Mobility Bed Mobility Overal bed mobility: Needs Assistance Bed Mobility: Supine to Sit, Sit to Supine     Supine to sit: Min assist Sit to supine: Min assist   General bed mobility comments: Min A to pull up and bring legs all the way off the bed.    Transfers Overall transfer level: Needs assistance Equipment used: 1 person hand held assist Transfers: Sit to/from Stand Sit to Stand: Min assist           General  transfer comment: Min assist for boost and balance, hand held support provided, could not locate RW in room or on unit but would benefit from one      Balance Overall balance assessment: Needs assistance Sitting-balance support: No upper extremity supported, Feet supported Sitting balance-Leahy Scale: Fair     Standing balance support: Single extremity supported Standing balance-Leahy Scale: Poor                             ADL either performed or assessed with clinical judgement   ADL Overall ADL's : Needs assistance/impaired Eating/Feeding: Set up;Sitting   Grooming: Set up;Cueing for sequencing;Sitting   Upper Body Bathing: Set up;Sitting   Lower Body Bathing: Moderate assistance;Sitting/lateral leans;Sit to/from stand   Upper Body Dressing : Set up;Sitting   Lower Body Dressing: Moderate assistance;Sitting/lateral leans;Sit to/from stand   Toilet Transfer: Maximal assistance;Ambulation   Toileting- Clothing Manipulation and Hygiene: Maximal assistance;Sitting/lateral lean;Sit to/from stand       Functional mobility during ADLs: Maximal assistance;Rolling walker (2 wheels) General ADL Comments: Pt presenting with increased weakness and difficulty taking steps limiting ability to complete LB ADL's and toileting.     Vision Baseline Vision/History: 0 No visual deficits Ability to See in Adequate Light: 0 Adequate Patient Visual Report: No change from baseline Vision Assessment?: No apparent visual deficits     Perception         Praxis         Pertinent Vitals/Pain Pain Assessment Pain Assessment: 0-10 Pain Score: 5  Pain Location: head Pain Descriptors / Indicators: Headache, Discomfort  Pain Intervention(s): Limited activity within patient's tolerance, Monitored during session, Repositioned     Extremity/Trunk Assessment Upper Extremity Assessment Upper Extremity Assessment: Generalized weakness;Right hand dominant   Lower Extremity  Assessment Lower Extremity Assessment: Defer to PT evaluation   Cervical / Trunk Assessment Cervical / Trunk Assessment: Kyphotic   Communication Communication Communication: Other (comment);Difficulty communicating thoughts/reduced clarity of speech (low volume)   Cognition Arousal: Alert Behavior During Therapy: WFL for tasks assessed/performed Overall Cognitive Status: No family/caregiver present to determine baseline cognitive functioning                                 General Comments: Pt has an old TBI     General Comments  VSS on RA, pt reported dizziness upon initial sitting up.    Exercises     Shoulder Instructions      Home Living Family/patient expects to be discharged to:: Skilled nursing facility                                 Additional Comments: Pt lived alone with PACE services      Prior Functioning/Environment Prior Level of Function : Independent/Modified Independent;Patient poor historian/Family not available             Mobility Comments: reports using both a walker and a walking stick. ADLs Comments: reports independence in the home        OT Problem List: Decreased strength;Decreased activity tolerance;Impaired balance (sitting and/or standing);Decreased safety awareness;Decreased cognition;Decreased knowledge of use of DME or AE;Impaired UE functional use      OT Treatment/Interventions: Self-care/ADL training;Therapeutic exercise;Energy conservation;DME and/or AE instruction;Therapeutic activities;Balance training;Patient/family education    OT Goals(Current goals can be found in the care plan section) Acute Rehab OT Goals Patient Stated Goal: To go home OT Goal Formulation: With patient Time For Goal Achievement: 10/27/23 Potential to Achieve Goals: Good ADL Goals Pt Will Perform Grooming: with modified independence;standing Pt Will Perform Lower Body Bathing: with supervision;sitting/lateral leans;sit  to/from stand Pt Will Perform Lower Body Dressing: with supervision;sit to/from stand;sitting/lateral leans Pt Will Transfer to Toilet: with supervision;ambulating Pt Will Perform Toileting - Clothing Manipulation and hygiene: with supervision;sitting/lateral leans;sit to/from stand  OT Frequency: Min 2X/week    Co-evaluation              AM-PAC OT "6 Clicks" Daily Activity     Outcome Measure Help from another person eating meals?: A Little Help from another person taking care of personal grooming?: A Little Help from another person toileting, which includes using toliet, bedpan, or urinal?: A Lot Help from another person bathing (including washing, rinsing, drying)?: A Lot Help from another person to put on and taking off regular upper body clothing?: A Little Help from another person to put on and taking off regular lower body clothing?: A Lot 6 Click Score: 15   End of Session Nurse Communication: Mobility status  Activity Tolerance: Patient tolerated treatment well Patient left: in bed;with call bell/phone within reach;with bed alarm set  OT Visit Diagnosis: Other abnormalities of gait and mobility (R26.89);Unsteadiness on feet (R26.81);Muscle weakness (generalized) (M62.81)                Time: 4098-1191 OT Time Calculation (min): 19 min Charges:  OT General Charges $OT Visit: 1 Visit OT Evaluation $OT Eval Moderate Complexity: 1 Mod  Alroy Portela, OTR/L  [facility] Acute Rehabilitation  Roselia Snipe Elane Bing Plume 10/13/2023, 3:09 PM

## 2023-10-13 NOTE — Assessment & Plan Note (Signed)
10-13-2023 hold BP meds for now. 10-14-2023 stable off HTN meds. 10-15-2023 stable off HTN meds. 10-16-2023 stable off HTN meds.

## 2023-10-13 NOTE — Progress Notes (Signed)
Spoke to pt social worker Joe Human resources officer and he will have to be contacted for consent for any hospital procedures. Contact info in chart.

## 2023-10-13 NOTE — Assessment & Plan Note (Signed)
10-13-2023 will need PT consult eventually. 10-14-2023 already seen by PT yesterday  10-15-2023 pt could walk 12 feet yesterday. C/o headache yesterday.  10-16-2023 stable. Continue with PT. B12 level normal Lab Results  Component Value Date/Time   [id number] 10/14/2023 05:19 AM

## 2023-10-13 NOTE — Assessment & Plan Note (Signed)
10-13-2023 chronic. Pt has DSS guardian who is not at bedside. Reportedly this is Armed forces technical officer.  10-15-2023 chronic.

## 2023-10-13 NOTE — Evaluation (Signed)
Physical Therapy Evaluation Patient Details Name: [name] MRN: [id number] DOB: [birth year] Today's Date: 10/13/2023  History of Present Illness  72 y.o. male presented 11/22 to the ED from home after a fall with prolonged downtime. Imaging with metastatic disease. PMHx: TBI with cognitive impairment, asthma, HTN, chronic venous stasis.  Clinical Impression  Pt admitted with above complications. PTA pt active with PACE program, living alone, using walking stick to ambulate primarily. Very pleasant and eager to get OOB today. Requires min assist for sit to stand and step pivot transfer with hand held support. Demonstrates gross instability, wide BOS, and shuffled steps. Will benefit from RW in room to ambulate, RN notified. Pt upright in chair end of session, alarm on, call bell in lap. Pt currently with functional limitations due to the deficits listed below (see PT Problem List). Pt will benefit from acute skilled PT to increase their independence and safety with mobility to allow discharge.           If plan is discharge home, recommend the following: A lot of help with walking and/or transfers;A lot of help with bathing/dressing/bathroom;Assistance with cooking/housework;Direct supervision/assist for medications management;Direct supervision/assist for financial management;Assist for transportation;Help with stairs or ramp for entrance;Supervision due to cognitive status   Can travel by private vehicle   Yes    Equipment Recommendations None recommended by PT (TBD next venue.)  Recommendations for Other Services       Functional Status Assessment Patient has had a recent decline in their functional status and demonstrates the ability to make significant improvements in function in a reasonable and predictable amount of time.     Precautions / Restrictions Precautions Precautions: Fall Restrictions Weight Bearing Restrictions: No      Mobility  Bed Mobility Overal bed  mobility: Needs Assistance Bed Mobility: Supine to Sit     Supine to sit: Contact guard     General bed mobility comments: CGA for safety. no physical assist needed.    Transfers Overall transfer level: Needs assistance Equipment used: 1 person hand held assist Transfers: Sit to/from Stand, Bed to chair/wheelchair/BSC Sit to Stand: Min assist   Step pivot transfers: Min assist       General transfer comment: Min assist for boost and balance, hand held support provided, could not locate RW in room or on unit but would benefit from one - RN notified. Min assist with multimodal cues to facilitate step pivot transfer to recliner, steps small and shuffled with wide BOS. Requires assist for balance and guidance into chair.    Ambulation/Gait                  Stairs            Wheelchair Mobility     Tilt Bed    Modified Rankin (Stroke Patients Only)       Balance Overall balance assessment: Needs assistance Sitting-balance support: No upper extremity supported, Feet supported Sitting balance-Leahy Scale: Fair     Standing balance support: Single extremity supported Standing balance-Leahy Scale: Poor                               Pertinent Vitals/Pain Pain Assessment Pain Assessment: Faces Faces Pain Scale: Hurts even more Pain Location: head Pain Descriptors / Indicators: Headache, Discomfort Pain Intervention(s): Repositioned, Patient requesting pain meds-RN notified, Monitored during session, Limited activity within patient's tolerance    Home Living Family/patient expects to be  discharged to:: Skilled nursing facility                   Additional Comments: Pt lived alone with PACE services    Prior Function Prior Level of Function : Independent/Modified Independent;Patient poor historian/Family not available             Mobility Comments: reports using both a walker and a walking stick. ADLs Comments: reports  independence in the home     Extremity/Trunk Assessment   Upper Extremity Assessment Upper Extremity Assessment: Defer to OT evaluation    Lower Extremity Assessment Lower Extremity Assessment: Generalized weakness       Communication   Communication Communication: Other (comment);Difficulty communicating thoughts/reduced clarity of speech (low volume)  Cognition Arousal: Alert Behavior During Therapy: WFL for tasks assessed/performed Overall Cognitive Status: No family/caregiver present to determine baseline cognitive functioning                                 General Comments: Pt has an old TBI        General Comments General comments (skin integrity, edema, etc.): Eager to get OOB, hopeful to eat soon.    Exercises     Assessment/Plan    PT Assessment Patient needs continued PT services  PT Problem List Decreased strength;Decreased range of motion;Decreased activity tolerance;Decreased balance;Decreased mobility;Decreased coordination;Decreased cognition;Decreased knowledge of use of DME;Decreased safety awareness;Decreased knowledge of precautions;Pain       PT Treatment Interventions DME instruction;Gait training;Functional mobility training;Therapeutic activities;Therapeutic exercise;Balance training;Neuromuscular re-education;Cognitive remediation;Patient/family education;Modalities    PT Goals (Current goals can be found in the Care Plan section)  Acute Rehab PT Goals Patient Stated Goal: Feel better PT Goal Formulation: With patient Time For Goal Achievement: 10/26/23 Potential to Achieve Goals: Fair    Frequency Min 1X/week     Co-evaluation               AM-PAC PT "6 Clicks" Mobility  Outcome Measure Help needed turning from your back to your side while in a flat bed without using bedrails?: None Help needed moving from lying on your back to sitting on the side of a flat bed without using bedrails?: A Little Help needed moving  to and from a bed to a chair (including a wheelchair)?: A Little Help needed standing up from a chair using your arms (e.g., wheelchair or bedside chair)?: A Little Help needed to walk in hospital room?: A Little Help needed climbing 3-5 steps with a railing? : A Lot 6 Click Score: 18    End of Session Equipment Utilized During Treatment: Gait belt Activity Tolerance: Patient tolerated treatment well Patient left: in chair;with call bell/phone within reach;with chair alarm set;with nursing/sitter in room Nurse Communication: Mobility status (Needs RW to mobilize with staff in room. Pt requests pain meds for headache.) PT Visit Diagnosis: Unsteadiness on feet (R26.81);Muscle weakness (generalized) (M62.81);History of falling (Z91.81);Difficulty in walking, not elsewhere classified (R26.2);Other symptoms and signs involving the nervous system (R29.898);Pain Pain - part of body:  (Headache)    Time: 1211-1228 PT Time Calculation (min) (ACUTE ONLY): 17 min   Charges:   PT Evaluation $PT Eval Moderate Complexity: 1 Mod   PT General Charges $$ ACUTE PT VISIT: 1 Visit         Kathlyn [place], PT, DPT [facility]  Rehabilitation Services Physical Therapist Office: [phone] Website: [email or web address]   Berton Mount 10/13/2023, 2:02 PM

## 2023-10-13 NOTE — Plan of Care (Signed)
IR was requested for biopsy.   CT reviewed and case approved for US guided left supraclavicular LN bx by Dr. Elby Showers.  Called the legal guardian to obtain consent, no answer.  Will call again on Monday to obtain the consent, it appears that the patient is ward of the state.   The procedure is tentatively scheduled for Monday pending IR schedule.   PLAN - NPO excepts meds Monday [place] - Will obtain consent on Monday - Formal consult to follow  Please call IR for questions and concerns.    Lynann Bologna Kade Demicco PA-C 10/13/2023 12:07 PM

## 2023-10-13 NOTE — Assessment & Plan Note (Signed)
10-13-2023 heme/onc consulted. Will need tissue biopsy. Pt with enlarged left supraclavicular lymph node on exam. This may be the most accessible place to biopsy. Defer to heme/onc on diagnostic workup.  10-14-2023 LN biopsy tomorrow.  10-15-2023 LN biopsy today.  10-16-2023 LN biopsy yesterday. Pathology pending.      Lab Results  Component Value Date/Time    CEA1 3.9 10/14/2023 05:19 AM    [id number] 5 10/14/2023 05:19 AM

## 2023-10-13 NOTE — Assessment & Plan Note (Signed)
10-13-2023 due to his fall and prolonged downtime. Initial CK 1104. Today CK down to 665 already. No evidence of renal failure.  10-14-2023 resolved.

## 2023-10-14 ENCOUNTER — Encounter (HOSPITAL_COMMUNITY): Payer: Self-pay | Admitting: Internal Medicine

## 2023-10-14 DIAGNOSIS — R591 Generalized enlarged lymph nodes: Secondary | ICD-10-CM | POA: Diagnosis not present

## 2023-10-14 DIAGNOSIS — K8689 Other specified diseases of pancreas: Secondary | ICD-10-CM | POA: Diagnosis not present

## 2023-10-14 DIAGNOSIS — N2889 Other specified disorders of kidney and ureter: Secondary | ICD-10-CM | POA: Diagnosis not present

## 2023-10-14 DIAGNOSIS — I10 Essential (primary) hypertension: Secondary | ICD-10-CM

## 2023-10-14 DIAGNOSIS — D509 Iron deficiency anemia, unspecified: Secondary | ICD-10-CM

## 2023-10-14 DIAGNOSIS — C778 Secondary and unspecified malignant neoplasm of lymph nodes of multiple regions: Secondary | ICD-10-CM | POA: Diagnosis not present

## 2023-10-14 LAB — IRON AND TIBC
Iron: 8 ug/dL — ABNORMAL LOW (ref 45–182)
Saturation Ratios: 4 % — ABNORMAL LOW (ref 17.9–39.5)
TIBC: 231 ug/dL — ABNORMAL LOW (ref 250–450)
UIBC: 223 ug/dL

## 2023-10-14 LAB — FERRITIN: Ferritin: 17 ng/mL — ABNORMAL LOW (ref 24–336)

## 2023-10-14 LAB — VITAMIN B12: Vitamin B-12: 627 pg/mL (ref 180–914)

## 2023-10-14 LAB — PSA: Prostatic Specific Antigen: 0.31 ng/mL (ref 0.00–4.00)

## 2023-10-14 MED ORDER — DEXTROSE IN LACTATED RINGERS 5 % IV SOLN: INTRAVENOUS | Status: DC

## 2023-10-14 NOTE — Progress Notes (Signed)
CSW received consult for possible SNF placement at time of discharge. CSW LVM for patients Legal Guardian Joe. CSW awaiting call back to discuss PT recommendation of SNF placement at time of discharge for patient. CSW to continue to follow and assist with discharge planning needs.

## 2023-10-14 NOTE — Progress Notes (Signed)
Mobility Specialist Progress Note    10/14/23 1131  Mobility  Activity Ambulated with assistance in room  Level of Assistance Moderate assist, patient does 50-74%  Assistive Device Front wheel walker  Distance Ambulated (ft) 12 ft  Activity Response Tolerated well  Mobility Referral Yes  $Mobility charge 1 Mobility  Mobility Specialist Start Time (ACUTE ONLY) 1117  Mobility Specialist Stop Time (ACUTE ONLY) 1130  Mobility Specialist Time Calculation (min) (ACUTE ONLY) 13 min   Pt received in bed and agreeable. Pt c/o headache. Pt required min-modA to stand and cueing for anterior weight shifting. Left in chair with call bell in reach and alarm on. RN notified.    [place] Mobility Specialist  Please Neurosurgeon or Rehab Office at [phone]

## 2023-10-14 NOTE — Consult Note (Signed)
Chief Complaint: Patient was seen in consultation today for biopsy Chief Complaint  Patient presents with   Fall   at the request of Yao, H (EDP)/ Bertis Ruddy, Ni (onc)  Referring Physician(s):  Jackie Plum (EDP)/ Artis Delay (onc)  Supervising Physician: Marliss Coots  Patient Status: [facility] - In-pt  History of Present Illness: [name] is a 72 y.o. male with PMHs of TBI with cognitive impairment, asthma, HTN, chronic venous stasis who was brought to ED on 10/12/23 after a fall with prolonged downtime, imaging showed neoplastic process/diffuse metastatic disease in the chest, abdomen, and pelvis, IR was consulted for biopsy.   Work up in ED showed no trauma to the head or neck, CT C/A/P showed:  1. No evidence of acute traumatic injury in the chest, abdomen, or pelvis. 2. 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 Multifocal adenopathy as well as nodules, both subcutaneous and in the abdominopelvic cavity. Solid lesions are seen within the pancreas and left kidney, either of which could be site of primary. Recommend oncologic workup. 3. Left adrenal nodule typically metastatic. 4. Small right inguinal hernia contains nonobstructed small bowel. 5. Question of pericolonic edema about the descending colon, nonspecific.  Patient was admitted for further eval, oncology was consulted who recommended biopsy. Case reviewed and approved for US guided left supraclavicular LN bx by Dr. Elby Showers.   Patient seen in room, laying in bed, NAD, NT at bedside.  Phe is alert and cheerful, but unable to have meaningful conversations.  States that he has discomfort in his right abdomen. Denies other complaints.   Past Medical History:  Diagnosis Date   Alcohol abuse    Arthritis    Asthma    Brain tumor (HCC)    Bronchitis    GERD (gastroesophageal reflux disease)    Hypertension    Hypertensive retinopathy    OU   Obesity     Traumatic brain injury (HCC)    Varicose veins     Past Surgical History:  Procedure Laterality Date   CATARACT EXTRACTION Bilateral 2018   Dr. Elmer Picker   EYE SURGERY Bilateral 2018   Cat Sx - Dr. Elmer Picker   HEMORRHOID SURGERY      Allergies: Patient has no known allergies.  Medications: Prior to Admission medications   Medication Sig Start Date End Date Taking? Authorizing Provider  acetaminophen (TYLENOL) 500 MG tablet Take 500 mg by mouth every 8 (eight) hours as needed for headache or mild pain. 06/14/15  Yes [provider]  albuterol (VENTOLIN HFA) 108 (90 Base) MCG/ACT inhaler Inhale 1-2 puffs into the lungs every 6 (six) hours as needed for wheezing or shortness of breath.   Yes [provider]  atorvastatin (LIPITOR) 20 MG tablet Take 20 mg by mouth daily.   Yes [provider]  budesonide-formoterol (SYMBICORT) 160-4.5 MCG/ACT inhaler Inhale 2 puffs into the lungs 2 (two) times daily.   Yes [provider]  chlorhexidine (PERIDEX) 0.12 % solution Use as directed 15 mLs in the mouth or throat 2 (two) times daily.   Yes [provider]  hydrocortisone cream 1 % Apply 1 application topically See admin instructions. Apply thin layer to affected area(s) face daily as needed   Yes [provider]  ketoconazole (NIZORAL) 2 % shampoo Apply 1 application topically 2 (two) times a week.   Yes [provider]  loperamide (IMODIUM A-D) 2 MG tablet Take 2 mg by mouth daily as needed  for diarrhea or loose stools. Do not exceed more than 8mg  or 4 tablets within 24 hours.   Yes [provider]  losartan (COZAAR) 50 MG tablet Take 50 mg by mouth daily.   Yes [provider]  mirabegron ER (MYRBETRIQ) 25 MG TB24 tablet Take 25 mg by mouth daily.   Yes [provider]  Multiple Vitamin (MULTIVITAMIN WITH MINERALS) TABS tablet Take 1 tablet by mouth daily. 10/26/21  Yes [name] Salter, MD  Sodium Fluoride  (PREVIDENT 5000 BOOSTER PLUS) 1.1 % PSTE Place 1 application onto teeth at bedtime.   Yes [provider]  tamsulosin (FLOMAX) 0.4 MG CAPS capsule Take 0.4 mg by mouth daily. 02/23/16  Yes [provider]  Vitamin D, Ergocalciferol, (DRISDOL) 1.25 MG (50000 UNIT) CAPS capsule Take 50,000 Units by mouth every 30 (thirty) days.   Yes [provider]  ascorbic acid (VITAMIN C) 250 MG tablet Take 1 tablet (250 mg total) by mouth 2 (two) times daily. Patient not taking: Reported on 10/12/2023 10/25/21   [name] Salter, MD  doxycycline (DORYX) 100 MG EC tablet Take 100 mg by mouth 2 (two) times daily. Started 10-19-21 for 14 days Patient not taking: Reported on 10/12/2023    [provider]  feeding supplement (ENSURE ENLIVE / ENSURE PLUS) LIQD Take 237 mLs by mouth 2 (two) times daily between meals. 10/26/21   [name] Salter, MD  guaiFENesin (MUCINEX) 600 MG 12 hr tablet Take 1 tablet (600 mg total) by mouth 2 (two) times daily. Patient not taking: Reported on 10/12/2023 10/25/21   [name] Salter, MD  traMADol (ULTRAM) 50 MG tablet Take 1 tablet (50 mg total) by mouth every 6 (six) hours as needed for moderate pain or severe pain. Patient not taking: Reported on 10/12/2023 10/25/21   [name] Salter, MD     Family History  Problem Relation Age of Onset   Diabetes Father     Social History   Socioeconomic History   Marital status: Single    Spouse name: Not on file   Number of children: Not on file   Years of education: Not on file   Highest education level: Not on file  Occupational History   Not on file  Tobacco Use   Smoking status: Never   Smokeless tobacco: Never  Vaping Use   Vaping status: Never Used  Substance and Sexual Activity   Alcohol use: Yes   Drug use: No   Sexual activity: Not Currently  Other Topics Concern   Not on file  Social History Narrative   Not on file   Social Determinants of Health   Financial Resource Strain: Not on  file  Food Insecurity: Patient Unable To Answer (10/13/2023)   Hunger Vital Sign    Worried About Running Out of Food in the Last Year: Patient unable to answer    Ran Out of Food in the Last Year: Patient unable to answer  Transportation Needs: Patient Unable To Answer (10/13/2023)   PRAPARE - Administrator, Civil Service (Medical): Patient unable to answer    Lack of Transportation (Non-Medical): Patient unable to answer  Physical Activity: Not on file  Stress: Not on file  Social Connections: Not on file     Review of Systems: A 12 point ROS discussed and pertinent positives are indicated in the HPI above.  All other systems are negative.  Vital Signs: BP 106/62 (BP Location: Left Arm)   Pulse 70  Temp 98.5 F (36.9 C) (Oral)   Resp 18   Ht 5' 7.5" (1.715 m)   Wt 147 lb 14.9 oz (67.1 kg)   SpO2 99%   BMI 22.83 kg/m    Physical Exam Vitals reviewed.  Constitutional:      General: He is not in acute distress.    Appearance: He is not ill-appearing.  HENT:     Head: Normocephalic and atraumatic.     Mouth/Throat:     Mouth: Mucous membranes are moist.     Pharynx: Oropharynx is clear.  Neck:     Comments: Palpable left supraclavicular LAN, no TTP.  Cardiovascular:     Rate and Rhythm: Normal rate and regular rhythm.     Heart sounds: Normal heart sounds.  Pulmonary:     Effort: Pulmonary effort is normal.     Breath sounds: Normal breath sounds.  Abdominal:     General: Abdomen is flat. Bowel sounds are normal.     Palpations: Abdomen is soft.  Skin:    General: Skin is warm and dry.     Coloration: Skin is not jaundiced or pale.  Neurological:     Mental Status: He is alert.     MD Evaluation Airway: WNL Heart: WNL Abdomen: WNL Chest/ Lungs: WNL ASA  Classification: 3 Mallampati/Airway Score: Two  Imaging: CT CHEST ABDOMEN PELVIS W CONTRAST  Result Date: 10/12/2023 CLINICAL DATA:  Blunt trauma.  Status post unwitnessed fall. EXAM:  CT CHEST, ABDOMEN, AND PELVIS WITH CONTRAST TECHNIQUE: Multidetector CT imaging of the chest, abdomen and pelvis was performed following the standard protocol during bolus administration of intravenous contrast. RADIATION DOSE REDUCTION: This exam was performed according to the departmental dose-optimization program which includes automated exposure control, adjustment of the mA and/or kV according to patient size and/or use of iterative reconstruction technique. CONTRAST:  75mL OMNIPAQUE IOHEXOL 350 MG/ML SOLN COMPARISON:  Chest radiograph 10/22/2021. FINDINGS: CT CHEST FINDINGS Cardiovascular: No evidence of acute aortic or vascular injury. The heart is normal in size. No pericardial effusion. Mediastinum/Nodes: No evidence of mediastinal hemorrhage or hematoma. Enlarged right axillary node, 19 mm short axis series 3, image 17. Left supraclavicular soft tissue nodule measures 18 mm series 3, image 8. L non-specific 8 mm anterior mediastinal node series 3, image 35. Small hiatal hernia. Eft carotid node measuring 14 mm series 3, image 12. Lungs/Pleura: No pneumothorax or pulmonary contusion. No pleural effusion. No pulmonary mass. Tiny punctate subpleural right lower lobe nodule series 4, image 112, nonspecific. Musculoskeletal: Scattered Schmorl's nodes in the thoracic spine. No acute thoracic fracture. No evidence of acute fracture of the ribs, sternum, included clavicles or shoulder girdles. Suspect remote right proximal humerus fracture. Lobulated subcutaneous nodule posterior left upper arm/periscapular region measuring 2.8 cm series 3, image 28. Left lateral chest wall node/nodule measuring 12 mm series 3, image 39. CT ABDOMEN PELVIS FINDINGS Hepatobiliary: No hepatic injury or perihepatic hematoma. No evidence of focal liver lesion. Mild motion artifact and streak artifact from right arm down positioning. Gallbladder is unremarkable. Pancreas: No evidence of injury. 2.5 cm suspected mass in the pancreatic  head series 3, image 69. This does not approach the superior mesenteric artery or vein on provided view. No peripancreatic inflammation. Spleen: No splenic injury or perisplenic hematoma. Adrenals/Urinary Tract: No adrenal hemorrhage. There is a heterogeneous 1.5 cm left adrenal nodule. No evidence of renal injury. Solid mass arising from the upper anterior left kidney measures 4 x 3.4 cm, series 3, image 66. No  hydronephrosis. No bladder wall thickening Stomach/Bowel: No evidence of bowel injury or mesenteric hematoma. Moderate stool in the right colon. Question of pericolonic edema about the descending colon, series 3, image 91. Small right inguinal hernia contains nonobstructed small bowel. Normal appendix visualized. There is no small bowel dilatation or obvious small bowel mass. Small hiatal hernia. Vascular/Lymphatic: No evidence is acute vascular injury. No retroperitoneal fluid. Aortic atherosclerosis without aneurysm. Multifocal adenopathy in soft tissue nodularity throughout the abdomen and pelvis. Representative nodule in the right upper quadrant measures 18 mm series 3, image 54, posterior to the liver and above the adrenal gland. There right perinephric nodules, series 3, image 67 and 68. Left perinephric nodule series 3, image 72. Left retroperitoneal adenopathy, node measuring 18 mm short axis at the level of the left kidney series 3, image 71. There is aortocaval adenopathy at similar level. Left lower quadrant nodule medial to the descending colon measuring 19 mm series 3, image 86. There additional scattered nodules and soft tissue deposits. 17 mm right inguinal node series 3, image 122. Additional enlarged right inguinal nodes and left inguinal adenopathy. There are left perirectal node series 3 images 111 and 113. Reproductive: Mildly prominent prostate. Other: Soft tissue nodule, for example series 3, image 103 in the right gluteal/flank region. Mild generalized stranding of the intra-abdominal  fat which is nonspecific. No convincing ascites. No free air. Musculoskeletal: No evidence of acute fracture. Mild L1 compression deformity with superior endplate Schmorl's node has a chronic appearance. No evidence of focal bone lesion. IMPRESSION: 1. No evidence of acute traumatic injury in the chest, abdomen, or pelvis. 2. 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 Multifocal adenopathy as well as nodules, both subcutaneous and in the abdominopelvic cavity. Solid lesions are seen within the pancreas and left kidney, either of which could be site of primary. Recommend oncologic workup. 3. Left adrenal nodule typically metastatic. 4. Small right inguinal hernia contains nonobstructed small bowel. 5. Question of pericolonic edema about the descending colon, nonspecific. Aortic Atherosclerosis (ICD10-I70.0). Electronically Signed   By: Narda Rutherford M.D.   On: 10/12/2023 17:29   CT CERVICAL SPINE WO CONTRAST  Result Date: 10/12/2023 CLINICAL DATA:  Neck trauma (Age >= 65y) Unwitnessed fall. EXAM: CT CERVICAL SPINE WITHOUT CONTRAST TECHNIQUE: Multidetector CT imaging of the cervical spine was performed without intravenous contrast. Multiplanar CT image reconstructions were also generated. RADIATION DOSE REDUCTION: This exam was performed according to the departmental dose-optimization program which includes automated exposure control, adjustment of the mA and/or kV according to patient size and/or use of iterative reconstruction technique. COMPARISON:  None Available. FINDINGS: Alignment: Normal. Skull base and vertebrae: No acute fracture. Vertebral body heights are maintained. The dens and skull base are intact. Soft tissues and spinal canal: No prevertebral fluid or swelling. No visible canal hematoma. Lobulated soft tissue mass in the left neck measures 2.4 x 2.3 cm, series 9, image 76. Smaller adjacent soft tissue deposit, series 9, image 70.  18 mm soft tissue deposit in the left supraclavicular space series 9, image 80. Disc levels: Mild anterior spurring at multiple levels with preservation of disc spaces. Mild scattered facet hypertrophy. Upper chest: Assessed on concurrent chest CT, reported separately. Other: Carotid calcifications. IMPRESSION: 1. No acute fracture or subluxation of the cervical spine. 2. Lobulated soft tissue masses/nodules in the left neck and supraclavicular region, suspicious for adenopathy. This is greater than typically seen with reactive changes. Findings are suspicious for metastatic disease or  lymphoproliferative disorder such as lymphoma. Electronically Signed   By: Narda Rutherford M.D.   On: 10/12/2023 17:12   CT HEAD WO CONTRAST ( )  Result Date: 10/12/2023 CLINICAL DATA:  Head trauma, minor (Age >= 65y) Unwitnessed fall. EXAM: CT HEAD WITHOUT CONTRAST TECHNIQUE: Contiguous axial images were obtained from the base of the skull through the vertex without intravenous contrast. RADIATION DOSE REDUCTION: This exam was performed according to the departmental dose-optimization program which includes automated exposure control, adjustment of the mA and/or kV according to patient size and/or use of iterative reconstruction technique. COMPARISON:  10/21/2021 FINDINGS: Brain: No evidence of acute infarction, hemorrhage, hydrocephalus, extra-axial collection or mass lesion/mass effect. Generalized atrophy. Again seen cavum septum pellucidum. Moderate to advanced periventricular and deep white matter hypodensity typical of chronic small vessel ischemia. Remote right frontal and left temporal infarcts. Vascular: Atherosclerosis of skullbase vasculature without hyperdense vessel or abnormal calcification. Skull: No fracture or focal lesion. Sinuses/Orbits: No acute findings. Surgical change in the right superior orbit. Mucosal thickening throughout the ethmoid air cells, chronic. No mastoid effusion. Other: Mild left  parietooccipital scalp hematoma IMPRESSION: 1. Mild left parietooccipital scalp hematoma. No acute intracranial abnormality. No skull fracture. 2. Atrophy, chronic small vessel ischemia, and remote infarcts. Electronically Signed   By: Narda Rutherford M.D.   On: 10/12/2023 17:04    Labs:  CBC: Recent Labs    10/12/23 1352 10/13/23 0441  WBC 7.8 5.4  HGB 10.2* 8.5*  HCT 33.0* 28.3*  PLT 303 248    COAGS: No results for input(s): "INR", "APTT" in the last 8760 hours.  BMP: Recent Labs    10/12/23 1352 10/13/23 0441  NA 137 137  K 3.5 3.6  CL 103 106  CO2 24 23  GLUCOSE 85 119*  BUN 10 10  CALCIUM 8.9 8.2*  CREATININE 0.73 0.76  GFRNONAA >60 >60    LIVER FUNCTION TESTS: Recent Labs    10/12/23 1352  BILITOT 0.8  AST 51*  ALT 27  ALKPHOS 71  PROT 6.2*  ALBUMIN 3.1*    TUMOR MARKERS: No results for input(s): "AFPTM", "CEA", "CA199", "CHROMGRNA" in the last 8760 hours.  Assessment and Plan: 72 y.o. male with process/diffuse metastatic disease in the chest, abdomen, and pelvis who is in need of biopsy, will proceed with left supraclavicular LN bx.   VSS CBC yesterday hgb 8.5, plt 248 Not on AC/AP  The procedure is tentatively scheduled for tomorrow pending IR schedule.   Patient with hx of TBI with cognitive impairment, legal guardian called but no answer.  Appears that patient is ward of state.  Will call the legal guardian tomorrow to obtain consent.   PLAN - NPO except meds at [place]  - Will obtain consent from the legal guardian tomorrow   Thank you for this interesting consult.  I greatly enjoyed meeting ZOEY REPINSKI and look forward to participating in their care.  A copy of this report was sent to the requesting provider on this date.  Electronically Signed: Willette Brace, PA-C 10/14/2023, 11:48 AM   I spent a total of 40 Minutes    in face to face in clinical consultation, greater than 50% of which was counseling/coordinating care for L  supraclavicular LN bx.   This chart was dictated using voice recognition software.  Despite best efforts to proofread,  errors can occur which can change the documentation meaning.

## 2023-10-14 NOTE — Assessment & Plan Note (Signed)
10-14-2023 iron studies show iron deficiency. Likely due to metastatic cancer Iron/TIBC/Ferritin/ %Sat    Component Value Date/Time   IRON 8 (L) 10/14/2023 0519   TIBC 231 (L) 10/14/2023 0519   FERRITIN 17 (L) 10/14/2023 0519   IRONPCTSAT 4 (L) 10/14/2023 0519   10-15-2023 start po nu-iron 150 mg daily.

## 2023-10-14 NOTE — Progress Notes (Addendum)
PROGRESS NOTE    BRAZEN MARSILI  ZOX:[id number] DOB: [birth year] DOA: 10/12/2023 PCP: [name], MD  Subjective: Pt seen and examined.  Awake at bedside. Sitting on edge of bed waiting to eat breakfast.   Hospital Course: HPI: [name] is a 72 y.o. male with medical history significant for TBI with cognitive impairment, asthma, HTN, chronic venous stasis who presented to the ED from home after a fall with prolonged downtime.   Patient states he lives alone.  He says he tripped and fell 2 days ago.  He hit his head on the floor and was stuck between his bed and the wall.  [facility] follows with [facility] and when their transport team came to pick him up they found him on the ground.  Patient was complaining of neck and back pain.  EMS were called and he was brought to the ED for further evaluation.   ED Course  Labs/Imaging on admission: I have personally reviewed following labs and imaging studies.   Initial vitals showed BP 126/75, pulse 54, RR 20, temp 98.8 F, SpO2 98% on room air.   Labs showed CK 1104, WBC 7.8, hemoglobin 10.2, platelets 303,000, sodium 137, potassium 3.5, bicarb 24, BUN 10, creatinine 0.73, serum glucose 85, AST 51, ALT 27, alk phos 71, total bilirubin 0.8.  UA, uric acid, LDH pending collection.   CT head without contrast showed mild left parieto-occipital scalp hematoma.  No acute intracranial abnormality.  No skull fracture.  Atrophy, chronic small vessel ischemia, and remote infarcts noted.   CT cervical spine without contrast showed lobulated soft tissue masses/nodules in the left neck and supraclavicular region suspicious for adenopathy.  This is greater than typically seen with reactive changes since suspicious for metastatic disease or lymphoproliferative disorder such as lymphoma.  No acute fracture or subluxation of the C-spine.   CT chest/abdomen/pelvis with contrast was negative for evidence of acute traumatic injury in the chest, abdomen, pelvis.   Findings consistent with neoplastic process/diffuse metastatic disease in the chest, abdomen, and pelvis noted.  Multifocal adenopathy as well as nodules, left subcutaneous and in the abdominopelvic cavity.  Solid lesions within the pancreas and left kidney, either which could be site of primary.  Left adrenal nodule typically metastatic.  Small right inguinal hernia contains nonobstructed small bowel.  Question of pericolonic edema about the descending colon.   Patient was given 1 L normal saline.  EDP discussed with oncology, Dr. Bertis Ruddy, who recommended admission for further workup including IR biopsy and oncology will see in consultation.  The hospitalist service was consulted to admit.    Significant Events: Admitted 10/12/2023 for unknown metastatic disease on CT imaging with unknown primary   Significant Labs: Admission WBC 7.8, CK 1104, LDH 259, HgB 10.2, uric acid 6.9  Significant Imaging Studies: Admission CT head shows Mild left parietooccipital scalp hematoma  Admission CT c-spine shows No acute fracture or subluxation of the cervical spine. 2. Lobulated soft tissue masses/nodules in the left neck and supraclavicular region, suspicious for adenopathy. This is greater than typically seen with reactive changes. Findings are suspicious for metastatic disease or lymphoproliferative disorder such as lymphoma. Admission CT chest/abd/pelvis shows No evidence of acute traumatic injury in the chest, abdomen, or pelvis. 2. 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 Multifocal adenopathy as well as nodules, both subcutaneous and in the abdominopelvic cavity. Solid lesions are seen within the pancreas and left kidney, either of which could be site  of primary. Recommend oncologic workup. 3. Left adrenal nodule typically metastatic. 4. Small right inguinal hernia contains nonobstructed small bowel. 5. Question of pericolonic edema about the  descending colon, nonspecific.  Antibiotic Therapy: Anti-infectives (From admission, onward)    None       Procedures:   Consultants: Oncology IR    Assessment and Plan: * Metastatic disease (HCC) 10-13-2023 heme/onc consulted. Will need tissue biopsy. Pt with enlarged left supraclavicular lymph node on exam. This may be the most accessible place to biopsy. Defer to heme/onc on diagnostic workup. 10-14-2023 plan for lymph node biopsy in AM. Will start D5 LR @ midnight to prevent any hypoglycemia that he had while he was NPO.  Lymphadenopathy, generalized 10-13-2023 heme/onc consulted. Will need tissue biopsy. Pt with enlarged left supraclavicular lymph node on exam. This may be the most accessible place to biopsy. Defer to heme/onc on diagnostic workup.  10-14-2023 LN biopsy tomorrow.  Iron deficiency anemia 10-14-2023 iron studies show iron deficiency. Likely due to metastatic cancer Iron/TIBC/Ferritin/ %Sat    Component Value Date/Time   IRON 8 (L) 10/14/2023 0519   TIBC 231 (L) 10/14/2023 0519   FERRITIN 17 (L) 10/14/2023 0519   IRONPCTSAT 4 (L) 10/14/2023 0519     Left kidney mass 10-13-2023 heme/onc consulted. Will need tissue biopsy. Pt with enlarged left supraclavicular lymph node on exam. This may be the most accessible place to biopsy. Defer to heme/onc on diagnostic workup.  10-14-2023 LN biopsy tomorrow.  Pancreatic mass 10-13-2023 heme/onc consulted. Will need tissue biopsy. Pt with enlarged left supraclavicular lymph node on exam. This may be the most accessible place to biopsy. Defer to heme/onc on diagnostic workup.  10-14-2023 LN biopsy tomorrow.  Traumatic rhabdomyolysis (HCC) 10-13-2023 due to his fall and prolonged downtime. Initial CK 1104. Today CK down to 665 already. No evidence of renal failure.  10-14-2023 resolved.  Fall at home, initial encounter 10-13-2023 will need PT consult eventually. 10-14-2023 already seen by PT  yesterday  Mild persistent asthma without complication 10-13-2023 stable. Continue inhalers.  10-14-2023 stable.  HTN (hypertension) 10-13-2023 hold BP meds for now.  10-14-2023 stable off HTN meds.  TBI (traumatic brain injury) (HCC) 10-13-2023 chronic. Pt has DSS guardian who is not at bedside. Reportedly this is [employer] forces technical officer.       DVT prophylaxis: SCDs Start: 10/12/23 1948     Code Status: Full Code Family Communication: no family or guardian at bedside Disposition Plan: return home vs SNF Reason for continuing need for hospitalization: needs lymph node biopsy tomorrow.  Objective: Vitals:   10/13/23 2052 10/14/23 0401 10/14/23 0750 10/14/23 0841  BP:  114/72 106/62   Pulse: 79 70 70 70  Resp: 16 17 18 18   Temp:  (!) 97.5 F (36.4 C) 98.5 F (36.9 C)   TempSrc:  Oral Oral   SpO2: 96% 99% 99% 99%  Weight:      Height:        Intake/Output Summary (Last 24 hours) at 10/14/2023 1321 Last data filed at 10/14/2023 0831 Gross per 24 hour  Intake --  Output 650 ml  Net -650 ml   Filed Weights   10/12/23 2043  Weight: 67.1 kg    Examination:  Physical Exam Vitals and nursing note reviewed.  Constitutional:      General: He is not in acute distress.    Appearance: He is not toxic-appearing or diaphoretic.  HENT:     Head: Normocephalic and atraumatic.     Nose: Nose normal.  Eyes:     General: No scleral icterus. Cardiovascular:     Rate and Rhythm: Normal rate and regular rhythm.  Pulmonary:     Effort: Pulmonary effort is normal.     Breath sounds: Normal breath sounds.  Abdominal:     General: Abdomen is flat. Bowel sounds are normal.     Palpations: Abdomen is soft.  Musculoskeletal:     Right lower leg: No edema.     Left lower leg: No edema.  Lymphadenopathy:     Comments: +left supraclavicular palpable adenopathy  Skin:    General: Skin is warm and dry.  Neurological:     Mental Status: He is alert.     Data Reviewed: I have  personally reviewed following labs and imaging studies  CBC: Recent Labs  Lab 10/12/23 1352 10/13/23 0441  WBC 7.8 5.4  NEUTROABS 6.3  --   HGB 10.2* 8.5*  HCT 33.0* 28.3*  MCV 77.8* 77.5*  PLT 303 248   Basic Metabolic Panel: Recent Labs  Lab 10/12/23 1352 10/13/23 0441  NA 137 137  K 3.5 3.6  CL 103 106  CO2 24 23  GLUCOSE 85 119*  BUN 10 10  CREATININE 0.73 0.76  CALCIUM 8.9 8.2*   GFR: Estimated Creatinine Clearance: 80.4 mL/min (by C-G formula based on SCr of 0.76 mg/dL). Liver Function Tests: Recent Labs  Lab 10/12/23 1352  AST 51*  ALT 27  ALKPHOS 71  BILITOT 0.8  PROT 6.2*  ALBUMIN 3.1*   Cardiac Enzymes: Recent Labs  Lab 10/12/23 1352 10/13/23 0441  CKTOTAL 1,104* 665*   CBG: Recent Labs  Lab 10/13/23 0207 10/13/23 0256  GLUCAP 66* 155*   Anemia Panel: Recent Labs    10/14/23 0519  VITAMINB12 627  FERRITIN 17*  TIBC 231*  IRON 8*    Radiology Studies: CT CHEST ABDOMEN PELVIS W CONTRAST  Result Date: 10/12/2023 CLINICAL DATA:  Blunt trauma.  Status post unwitnessed fall. EXAM: CT CHEST, ABDOMEN, AND PELVIS WITH CONTRAST TECHNIQUE: Multidetector CT imaging of the chest, abdomen and pelvis was performed following the standard protocol during bolus administration of intravenous contrast. RADIATION DOSE REDUCTION: This exam was performed according to the departmental dose-optimization program which includes automated exposure control, adjustment of the mA and/or kV according to patient size and/or use of iterative reconstruction technique. CONTRAST:  75mL OMNIPAQUE IOHEXOL 350 MG/ML SOLN COMPARISON:  Chest radiograph 10/22/2021. FINDINGS: CT CHEST FINDINGS Cardiovascular: No evidence of acute aortic or vascular injury. The heart is normal in size. No pericardial effusion. Mediastinum/Nodes: No evidence of mediastinal hemorrhage or hematoma. Enlarged right axillary node, 19 mm short axis series 3, image 17. Left supraclavicular soft tissue  nodule measures 18 mm series 3, image 8. L non-specific 8 mm anterior mediastinal node series 3, image 35. Small hiatal hernia. Eft carotid node measuring 14 mm series 3, image 12. Lungs/Pleura: No pneumothorax or pulmonary contusion. No pleural effusion. No pulmonary mass. Tiny punctate subpleural right lower lobe nodule series 4, image 112, nonspecific. Musculoskeletal: Scattered Schmorl's nodes in the thoracic spine. No acute thoracic fracture. No evidence of acute fracture of the ribs, sternum, included clavicles or shoulder girdles. Suspect remote right proximal humerus fracture. Lobulated subcutaneous nodule posterior left upper arm/periscapular region measuring 2.8 cm series 3, image 28. Left lateral chest wall node/nodule measuring 12 mm series 3, image 39. CT ABDOMEN PELVIS FINDINGS Hepatobiliary: No hepatic injury or perihepatic hematoma. No evidence of focal liver lesion. Mild motion artifact and streak artifact from  right arm down positioning. Gallbladder is unremarkable. Pancreas: No evidence of injury. 2.5 cm suspected mass in the pancreatic head series 3, image 69. This does not approach the superior mesenteric artery or vein on provided view. No peripancreatic inflammation. Spleen: No splenic injury or perisplenic hematoma. Adrenals/Urinary Tract: No adrenal hemorrhage. There is a heterogeneous 1.5 cm left adrenal nodule. No evidence of renal injury. Solid mass arising from the upper anterior left kidney measures 4 x 3.4 cm, series 3, image 66. No hydronephrosis. No bladder wall thickening Stomach/Bowel: No evidence of bowel injury or mesenteric hematoma. Moderate stool in the right colon. Question of pericolonic edema about the descending colon, series 3, image 91. Small right inguinal hernia contains nonobstructed small bowel. Normal appendix visualized. There is no small bowel dilatation or obvious small bowel mass. Small hiatal hernia. Vascular/Lymphatic: No evidence is acute vascular injury. No  retroperitoneal fluid. Aortic atherosclerosis without aneurysm. Multifocal adenopathy in soft tissue nodularity throughout the abdomen and pelvis. Representative nodule in the right upper quadrant measures 18 mm series 3, image 54, posterior to the liver and above the adrenal gland. There right perinephric nodules, series 3, image 67 and 68. Left perinephric nodule series 3, image 72. Left retroperitoneal adenopathy, node measuring 18 mm short axis at the level of the left kidney series 3, image 71. There is aortocaval adenopathy at similar level. Left lower quadrant nodule medial to the descending colon measuring 19 mm series 3, image 86. There additional scattered nodules and soft tissue deposits. 17 mm right inguinal node series 3, image 122. Additional enlarged right inguinal nodes and left inguinal adenopathy. There are left perirectal node series 3 images 111 and 113. Reproductive: Mildly prominent prostate. Other: Soft tissue nodule, for example series 3, image 103 in the right gluteal/flank region. Mild generalized stranding of the intra-abdominal fat which is nonspecific. No convincing ascites. No free air. Musculoskeletal: No evidence of acute fracture. Mild L1 compression deformity with superior endplate Schmorl's node has a chronic appearance. No evidence of focal bone lesion. IMPRESSION: 1. No evidence of acute traumatic injury in the chest, abdomen, or pelvis. 2. 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 Multifocal adenopathy as well as nodules, both subcutaneous and in the abdominopelvic cavity. Solid lesions are seen within the pancreas and left kidney, either of which could be site of primary. Recommend oncologic workup. 3. Left adrenal nodule typically metastatic. 4. Small right inguinal hernia contains nonobstructed small bowel. 5. Question of pericolonic edema about the descending colon, nonspecific. Aortic Atherosclerosis  (ICD10-I70.0). Electronically Signed   By: Narda Rutherford M.D.   On: 10/12/2023 17:29   CT CERVICAL SPINE WO CONTRAST  Result Date: 10/12/2023 CLINICAL DATA:  Neck trauma (Age >= 65y) Unwitnessed fall. EXAM: CT CERVICAL SPINE WITHOUT CONTRAST TECHNIQUE: Multidetector CT imaging of the cervical spine was performed without intravenous contrast. Multiplanar CT image reconstructions were also generated. RADIATION DOSE REDUCTION: This exam was performed according to the departmental dose-optimization program which includes automated exposure control, adjustment of the mA and/or kV according to patient size and/or use of iterative reconstruction technique. COMPARISON:  None Available. FINDINGS: Alignment: Normal. Skull base and vertebrae: No acute fracture. Vertebral body heights are maintained. The dens and skull base are intact. Soft tissues and spinal canal: No prevertebral fluid or swelling. No visible canal hematoma. Lobulated soft tissue mass in the left neck measures 2.4 x 2.3 cm, series 9, image 76. Smaller adjacent soft tissue deposit, series 9, image 70.  18 mm soft tissue deposit in the left supraclavicular space series 9, image 80. Disc levels: Mild anterior spurring at multiple levels with preservation of disc spaces. Mild scattered facet hypertrophy. Upper chest: Assessed on concurrent chest CT, reported separately. Other: Carotid calcifications. IMPRESSION: 1. No acute fracture or subluxation of the cervical spine. 2. Lobulated soft tissue masses/nodules in the left neck and supraclavicular region, suspicious for adenopathy. This is greater than typically seen with reactive changes. Findings are suspicious for metastatic disease or lymphoproliferative disorder such as lymphoma. Electronically Signed   By: Narda Rutherford M.D.   On: 10/12/2023 17:12   CT HEAD WO CONTRAST ( )  Result Date: 10/12/2023 CLINICAL DATA:  Head trauma, minor (Age >= 65y) Unwitnessed fall. EXAM: CT HEAD WITHOUT CONTRAST  TECHNIQUE: Contiguous axial images were obtained from the base of the skull through the vertex without intravenous contrast. RADIATION DOSE REDUCTION: This exam was performed according to the departmental dose-optimization program which includes automated exposure control, adjustment of the mA and/or kV according to patient size and/or use of iterative reconstruction technique. COMPARISON:  10/21/2021 FINDINGS: Brain: No evidence of acute infarction, hemorrhage, hydrocephalus, extra-axial collection or mass lesion/mass effect. Generalized atrophy. Again seen cavum septum pellucidum. Moderate to advanced periventricular and deep white matter hypodensity typical of chronic small vessel ischemia. Remote right frontal and left temporal infarcts. Vascular: Atherosclerosis of skullbase vasculature without hyperdense vessel or abnormal calcification. Skull: No fracture or focal lesion. Sinuses/Orbits: No acute findings. Surgical change in the right superior orbit. Mucosal thickening throughout the ethmoid air cells, chronic. No mastoid effusion. Other: Mild left parietooccipital scalp hematoma IMPRESSION: 1. Mild left parietooccipital scalp hematoma. No acute intracranial abnormality. No skull fracture. 2. Atrophy, chronic small vessel ischemia, and remote infarcts. Electronically Signed   By: Narda Rutherford M.D.   On: 10/12/2023 17:04    Scheduled Meds:  mometasone-formoterol  2 puff Inhalation BID   mouth rinse  15 mL Mouth Rinse 4 times per day   Continuous Infusions:  [START ON 10/15/2023] dextrose 5% lactated ringers       LOS: 2 days   Time spent: 35 minutes  Carollee Herter, DO  [facility]  10/14/2023, 1:21 PM

## 2023-10-14 NOTE — [place] FL2 (Signed)
[place] MEDICAID FL2 LEVEL OF CARE FORM     IDENTIFICATION  Patient Name: [name] Birthdate: 08/24/1951 Sex: male Admission Date (Current Location): 10/12/2023  [facility] and [place] Number:  Producer, television/film/video and Address:  [facility], [address], [place], [place] [address]      Provider Number: [id number]  Attending Physician Name and Address:  Carollee Herter, DO  Relative Name and Phone Number:  Geraldine Solar [phone]    Current Level of Care: Hospital Recommended Level of Care: Skilled Nursing Facility Prior Approval Number:    Date Approved/Denied:   PASRR Number: [id number]  Discharge Plan: SNF    Current Diagnoses: Patient Active Problem List   Diagnosis Date Noted   Pancreatic mass 10/13/2023   Left kidney mass 10/13/2023   Lymphadenopathy, generalized 10/13/2023   Fall at home, initial encounter 10/12/2023   Metastatic disease (HCC) 10/12/2023   Traumatic rhabdomyolysis (HCC) 10/12/2023   Malnutrition of moderate degree 10/25/2021   Closed displaced fracture of middle phalanx of right ring finger 10/22/2021   Asthma, chronic, unspecified asthma severity, with acute exacerbation 10/22/2021   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 08/03/2021   Right Lower Extremity Wound infection 12/01/2020   Varicose veins of right lower extremity with complications 07/17/2016   Mild persistent asthma without complication 05/17/2016   Neuropathy 11/16/2014   TBI (traumatic brain injury) (HCC) 10/08/2014   Cognitive deficits 10/08/2014   HTN (hypertension) 10/08/2014    Orientation RESPIRATION BLADDER Height & Weight     Self, Time, Situation, Place  Normal Incontinent, External catheter (External Urinary Catheter) Weight: 147 lb 14.9 oz (67.1 kg) Height:  5' 7.5" (171.5 cm)  BEHAVIORAL SYMPTOMS/MOOD NEUROLOGICAL BOWEL NUTRITION STATUS      Incontinent Diet (Please see discharge summary)  AMBULATORY STATUS  COMMUNICATION OF NEEDS Skin   Extensive Assist Verbally Other (Comment) (Pale,dry,Flaky,abrasion,knee,Bil.,Erythema,buttocks,leg,Bil.,Wound/Incision LDAs,Wound/Incision Open or Dehisced venous stasis ulcer,pretibail, L,Wound/Incision open or dehisced venous stasis ulcer/non-pressure wound pretibial,R)                       Personal Care Assistance Level of Assistance  Bathing, Feeding, Dressing Bathing Assistance: Maximum assistance Feeding assistance: Limited assistance Dressing Assistance: Maximum assistance     Functional Limitations Info  Sight, Hearing, Speech Sight Info: Impaired Hearing Info: Adequate Speech Info: Adequate    SPECIAL CARE FACTORS FREQUENCY  PT (By licensed PT), OT (By licensed OT)     PT Frequency: 5x min weekly OT Frequency: 5x min weekly            Contractures Contractures Info: Not present    Additional Factors Info  Code Status, Allergies Code Status Info: FULL Allergies Info: NKA           Current Medications (10/14/2023):  This is the current hospital active medication list Current Facility-Administered Medications  Medication Dose Route Frequency Provider Last Rate Last Admin   acetaminophen (TYLENOL) tablet 650 mg  650 mg Oral Q6H PRN Charlsie Quest, MD   650 mg at 10/13/23 2119   Or   acetaminophen (TYLENOL) suppository 650 mg  650 mg Rectal Q6H PRN Charlsie Quest, MD       albuterol (PROVENTIL) (2.5 MG/3ML) 0.083% nebulizer solution 2.5 mg  2.5 mg Inhalation Q6H PRN Charlsie Quest, MD       [START ON 10/15/2023] dextrose 5 % in lactated ringers infusion   Intravenous Continuous Carollee Herter, DO  mometasone-formoterol (DULERA) 200-5 MCG/ACT inhaler 2 puff  2 puff Inhalation BID Charlsie Quest, MD   2 puff at 10/14/23 0841   ondansetron (ZOFRAN) tablet 4 mg  4 mg Oral Q6H PRN Charlsie Quest, MD       Or   ondansetron (ZOFRAN) injection 4 mg  4 mg Intravenous Q6H PRN Charlsie Quest, MD       Oral care mouth rinse  15 mL  Mouth Rinse 4 times per day Charlsie Quest, MD   15 mL at 10/13/23 2102   Oral care mouth rinse  15 mL Mouth Rinse PRN Charlsie Quest, MD       senna-docusate (Senokot-S) tablet 1 tablet  1 tablet Oral QHS PRN Charlsie Quest, MD         Discharge Medications: Please see discharge summary for a list of discharge medications.  Relevant Imaging Results:  Relevant Lab Results:   Additional Information [id number]  Delilah Shan, LCSWA

## 2023-10-14 NOTE — TOC Initial Note (Addendum)
Transition of Care [facility]) - Initial/Assessment Note    Patient Details  Name: [name] MRN: [id number] Date of Birth: [birth year]  Transition of Care [facility]) CM/SW Contact:    Delilah Shan, LCSWA Phone Number: 10/14/2023, 10:02 AM  Clinical Narrative:                  CSW received consult for possible SNF placement at time of discharge. CSW spoke with patients legal guardian Joe regarding PT recommendation of SNF placement for patient at time of discharge. Patients legal guardian reports patient is with [facility] and PTA was from home alone. Patients legal guardian expressed understanding of PT recommendation and is agreeable to SNF placement at time of discharge. Patients Legal Guardian gave CSW permission to fax out for SNF placement for patient. Patients legal guardian informed CSW that patients [facility] case worker is Lacie Scotts tele# [phone].No further questions reported at this time. CSW to continue to follow and assist with discharge planning needs.   Expected Discharge Plan: Skilled Nursing Facility Barriers to Discharge: Continued Medical Work up   Patient Goals and CMS Choice     Choice offered to / list presented to :  Doctor, hospital Guardian)      Expected Discharge Plan and Services In-house Referral: Clinical Social Work     Living arrangements for the past 2 months: Apartment                                      Prior Living Arrangements/Services Living arrangements for the past 2 months: Apartment Lives with:: Self Patient language and need for interpreter reviewed:: Yes Do you feel safe going back to the place where you live?: No   SNF  Need for Family Participation in Patient Care: Yes (Comment) Care giver support system in place?: Yes (comment)   Criminal Activity/Legal Involvement Pertinent to Current Situation/Hospitalization: No - Comment as needed  Activities of Daily Living      Permission Sought/Granted Permission sought to  share information with : Case Manager, Magazine features editor, Family Supports Permission granted to share information with : Yes, Verbal Permission Granted  Share Information with NAME: Joe  Permission granted to share info w AGENCY: SNF  Permission granted to share info w Relationship: Legal Guardian  Permission granted to share info w Contact Information: Gabriel Rung [phone]  Emotional Assessment       Orientation: : Oriented to Self, Oriented to Place, Oriented to  Time, Oriented to Situation (WDL) Alcohol / Substance Use: Not Applicable Psych Involvement: No (comment)  Admission diagnosis:  Fall, initial encounter [W19.XXXA] Fall at home, initial encounter [phone].XXXA, Y92.009] Lymphoma, unspecified body region, unspecified lymphoma type (HCC) [C85.90] Patient Active Problem List   Diagnosis Date Noted   Pancreatic mass 10/13/2023   Left kidney mass 10/13/2023   Lymphadenopathy, generalized 10/13/2023   Fall at home, initial encounter 10/12/2023   Metastatic disease (HCC) 10/12/2023   Traumatic rhabdomyolysis (HCC) 10/12/2023   Malnutrition of moderate degree 10/25/2021   Closed displaced fracture of middle phalanx of right ring finger 10/22/2021   Asthma, chronic, unspecified asthma severity, with acute exacerbation 10/22/2021   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 08/03/2021   Right Lower Extremity Wound infection 12/01/2020   Varicose veins of right lower extremity with complications 07/17/2016   Mild persistent asthma without complication 05/17/2016   Neuropathy 11/16/2014   TBI (traumatic brain injury) (  HCC) 10/08/2014   Cognitive deficits 10/08/2014   HTN (hypertension) 10/08/2014   PCP:  [name], MD Pharmacy:   [facility] - [place], [place] - [phone] [place] [address] Phone: [phone] Fax: [phone]  [facility] - [place], [place] - [address]  [place] [address] Phone: [phone] Fax: [phone]     Social Determinants of Health (SDOH) Social History: SDOH Screenings   Food Insecurity: Patient Unable To Answer (10/13/2023)  Housing: Patient Unable To Answer (10/13/2023)  Transportation Needs: Patient Unable To Answer (10/13/2023)  Utilities: Patient Unable To Answer (10/13/2023)  Tobacco Use: Low Risk  (10/21/2021)   SDOH Interventions:     Readmission Risk Interventions     No data to display

## 2023-10-15 ENCOUNTER — Inpatient Hospital Stay (HOSPITAL_COMMUNITY): Payer: Medicare (Managed Care)

## 2023-10-15 DIAGNOSIS — N2889 Other specified disorders of kidney and ureter: Secondary | ICD-10-CM | POA: Diagnosis not present

## 2023-10-15 DIAGNOSIS — D509 Iron deficiency anemia, unspecified: Secondary | ICD-10-CM

## 2023-10-15 DIAGNOSIS — R591 Generalized enlarged lymph nodes: Secondary | ICD-10-CM | POA: Diagnosis not present

## 2023-10-15 DIAGNOSIS — C778 Secondary and unspecified malignant neoplasm of lymph nodes of multiple regions: Secondary | ICD-10-CM | POA: Diagnosis not present

## 2023-10-15 DIAGNOSIS — J4530 Mild persistent asthma, uncomplicated: Secondary | ICD-10-CM

## 2023-10-15 LAB — CANCER ANTIGEN 19-9: CA 19-9: 5 U/mL (ref 0–35)

## 2023-10-15 LAB — CEA: CEA: 3.9 ng/mL (ref 0.0–4.7)

## 2023-10-15 MED ORDER — POLYSACCHARIDE IRON COMPLEX 150 MG PO CAPS
150.0000 mg | ORAL_CAPSULE | Freq: Every day | ORAL | Status: DC
  Administered 2023-10-15 – 2023-10-18 (×4): 150 mg via ORAL
  Filled 2023-10-15 (×5): qty 1

## 2023-10-15 MED ORDER — LIDOCAINE HCL (PF) 1 % IJ SOLN
10.0000 mL | Freq: Once | INTRAMUSCULAR | Status: AC
  Administered 2023-10-15: 10 mL

## 2023-10-15 MED ORDER — HYDROCERIN EX CREA
TOPICAL_CREAM | Freq: Every day | CUTANEOUS | Status: DC
  Filled 2023-10-15: qty 113

## 2023-10-15 NOTE — TOC Progression Note (Signed)
Transition of Care [facility]) - Progression Note    Patient Details  Name: [name] MRN: [id number] Date of Birth: [birth year]  Transition of [facility]) CM/SW Contact  Sheriff Rodenberg A [place], [place] Phone Number: 10/15/2023, 4:25 PM  Clinical Narrative:     CSW contacted pt's legal guardian, Geraldine Solar, to update that there are no current bed offers. CSW to coordinate plan with legal guardian and Child psychotherapist at [employer].    TOC will continue to follow.   Expected Discharge Plan: Skilled Nursing Facility Barriers to Discharge: Continued Medical Work up  Expected Discharge Plan and Services In-house Referral: Clinical Social Work     Living arrangements for the past 2 months: Apartment                                       Social Determinants of Health (SDOH) Interventions SDOH Screenings   Food Insecurity: Patient Unable To Answer (10/13/2023)  Housing: Patient Unable To Answer (10/13/2023)  Transportation Needs: Patient Unable To Answer (10/13/2023)  Utilities: Patient Unable To Answer (10/13/2023)  Tobacco Use: Low Risk  (10/14/2023)    Readmission Risk Interventions     No data to display

## 2023-10-15 NOTE — Progress Notes (Signed)
Occupational Therapy Treatment Patient Details Name: [name] MRN: [id number] DOB: [birth year] Today's Date: 10/15/2023   History of present illness 72 y.o. male presented 11/22 to the ED from home after a fall with prolonged downtime. Imaging with metastatic disease. Underwent lymph node biopsy 11/24. PMHx: TBI with cognitive impairment, asthma, HTN, chronic venous stasis.   OT comments  Pt aware he had a BM in the bed. Min assist for bed mobility and to transfer to [facility]. Min assist to change soiled gown. Total assist for pericare. Returned to supine without physical assist. Patient will benefit from continued inpatient follow up therapy, <3 hours/day.       If plan is discharge home, recommend the following:  A lot of help with bathing/dressing/bathroom;Assistance with cooking/housework;Direct supervision/assist for medications management;A little help with walking and/or transfers;Direct supervision/assist for financial management;Assist for transportation;Help with stairs or ramp for entrance   Equipment Recommendations  None recommended by OT    Recommendations for Other Services      Precautions / Restrictions Precautions Precautions: Fall Restrictions Weight Bearing Restrictions: No       Mobility Bed Mobility Overal bed mobility: Needs Assistance Bed Mobility: Supine to Sit, Sit to Supine     Supine to sit: Min assist Sit to supine: Supervision   General bed mobility comments: Min A to pull up and bring legs all the way off the bed, no assist to return to supine    Transfers Overall transfer level: Needs assistance Equipment used: Rolling walker (2 wheels) Transfers: Sit to/from Stand, Bed to chair/wheelchair/BSC Sit to Stand: Min assist, From elevated surface     Step pivot transfers: Min assist     General transfer comment: assist to rise and steady, pt takes shuffling steps     Balance Overall balance assessment: Needs assistance   Sitting  balance-Leahy Scale: Fair     Standing balance support: Bilateral upper extremity supported Standing balance-Leahy Scale: Poor                             ADL either performed or assessed with clinical judgement   ADL Overall ADL's : Needs assistance/impaired     Grooming: Wash/dry hands;Wash/dry face;Sitting;Supervision/safety           Upper Body Dressing : Sitting;Minimal assistance       Toilet Transfer: Stand-pivot;Rolling walker (2 wheels);BSC/3in1;Minimal assistance   Toileting- Clothing Manipulation and Hygiene: Total assistance;Sit to/from stand              Extremity/Trunk Assessment              Vision       Perception     Praxis      Cognition Arousal: Alert Behavior During Therapy: Flat affect Overall Cognitive Status: No family/caregiver present to determine baseline cognitive functioning                                 General Comments: Pt has an old TBI        Exercises      Shoulder Instructions       General Comments      Pertinent Vitals/ Pain       Pain Assessment Pain Assessment: Faces Faces Pain Scale: Hurts little more Pain Location: head, back Pain Descriptors / Indicators: Aching Pain Intervention(s): Monitored during session, Repositioned, Patient requesting pain meds-RN notified  Home Living  Prior Functioning/Environment              Frequency  Min 1X/week        Progress Toward Goals  OT Goals(current goals can now be found in the care plan section)  Progress towards OT goals: Progressing toward goals  Acute Rehab OT Goals OT Goal Formulation: With patient Time For Goal Achievement: 10/27/23 Potential to Achieve Goals: Good  Plan      Co-evaluation                 AM-PAC OT "6 Clicks" Daily Activity     Outcome Measure   Help from another person eating meals?: A Little Help from another person  taking care of personal grooming?: A Little Help from another person toileting, which includes using toliet, bedpan, or urinal?: Total Help from another person bathing (including washing, rinsing, drying)?: A Lot Help from another person to put on and taking off regular upper body clothing?: A Little Help from another person to put on and taking off regular lower body clothing?: A Lot 6 Click Score: 14    End of Session Equipment Utilized During Treatment: Gait belt;Rolling walker (2 wheels)  OT Visit Diagnosis: Other abnormalities of gait and mobility (R26.89);Unsteadiness on feet (R26.81);Muscle weakness (generalized) (M62.81);Other symptoms and signs involving cognitive function   Activity Tolerance Patient tolerated treatment well   Patient Left in bed;with call bell/phone within reach;with bed alarm set;with nursing/sitter in room (WOC in room)   Nurse Communication Patient requests pain meds;Other (comment) (wants to eat)        Time: 1025-1100 OT Time Calculation (min): 35 min  Charges: OT General Charges $OT Visit: 1 Visit OT Treatments $Self Care/Home Management : 23-37 mins  Berna Spare, OTR/L Acute Rehabilitation Services Office: [phone]  Evern Bio 10/15/2023, 11:18 AM

## 2023-10-15 NOTE — Procedures (Signed)
Vascular and Interventional Radiology Procedure Note  Patient: [name] DOB: 1951/07/13 Medical Record Number: [id number] Note Date/Time: 10/15/23 9:16 AM   Performing Physician: Roanna Banning, MD Assistant(s): None  Diagnosis: Adenopathy. Q met Dx. No DX   Procedure: LEFT SUPRACLAVICULAR LYMPH NODE BIOPSY  Anesthesia: Conscious Sedation Complications: None Estimated Blood Loss: Minimal Specimens: Sent for Pathology  Findings:  Successful Ultrasound-guided biopsy of L [address] LN. A total of 3 samples were obtained. Hemostasis of the tract was achieved using Manual Pressure.  Plan: Bed rest for 0 hours.  See detailed procedure note with images in PACS. The patient tolerated the procedure well without incident or complication and was returned to Recovery in stable condition.    Roanna Banning, MD Vascular and Interventional Radiology Specialists [facility] Radiology   Pager. [phone] Clinic. [phone]

## 2023-10-15 NOTE — Consult Note (Signed)
WOC Nurse Consult Note: Reason for Consult: Consult requested for bilat legs.  Pt has dry brown scaly skin with scattered areas of dry scabs which peel off easily, revealing pink moist skin. to bilat calves.  Right leg with partial thickness  wound; 3X2X.1cm, red and dry. Dressing procedure/placement/frequency: Topical treatment orders provided for bedside nurses to perform as follows:  Eucerin cream to assist with removal of loose dry skin.  Xeroform gauze to right leg wound to promote moist healing. Please re-consult if further assistance is needed.  Thank-you,  Cammie Mcgee MSN, RN, CWOCN, [place], CNS [phone]

## 2023-10-15 NOTE — Progress Notes (Addendum)
PROGRESS NOTE    [name]  ZHY:[id number] DOB: 09-14-1951 DOA: 10/12/2023 PCP: [name], MD  Subjective: Pt seen and examined.  NPO. On IVF due to hypoglycemia while NPO 3 days ago.  Scheduled for lymph node biopsy today.  Will need SNF at discharge. Pt has DSS legal guardian.   Hospital Course: HPI: [name] is a 72 y.o. male with medical history significant for TBI with cognitive impairment, asthma, HTN, chronic venous stasis who presented to the ED from home after a fall with prolonged downtime.   Patient states he lives alone.  He says he tripped and fell 2 days ago.  He hit his head on the floor and was stuck between his bed and the wall.  He follows with [facility] and when their transport team came to pick him up they found him on the ground.  Patient was complaining of neck and back pain.  EMS were called and he was brought to the ED for further evaluation.   ED Course  Labs/Imaging on admission: I have personally reviewed following labs and imaging studies.   Initial vitals showed BP 126/75, pulse 54, RR 20, temp 98.8 F, SpO2 98% on room air.   Labs showed CK 1104, WBC 7.8, hemoglobin 10.2, platelets 303,000, sodium 137, potassium 3.5, bicarb 24, BUN 10, creatinine 0.73, serum glucose 85, AST 51, ALT 27, alk phos 71, total bilirubin 0.8.  UA, uric acid, LDH pending collection.   CT head without contrast showed mild left parieto-occipital scalp hematoma.  No acute intracranial abnormality.  No skull fracture.  Atrophy, chronic small vessel ischemia, and remote infarcts noted.   CT cervical spine without contrast showed lobulated soft tissue masses/nodules in the left neck and supraclavicular region suspicious for adenopathy.  This is greater than typically seen with reactive changes since suspicious for metastatic disease or lymphoproliferative disorder such as lymphoma.  No acute fracture or subluxation of the C-spine.   CT chest/abdomen/pelvis with  contrast was negative for evidence of acute traumatic injury in the chest, abdomen, pelvis.  Findings consistent with neoplastic process/diffuse metastatic disease in the chest, abdomen, and pelvis noted.  Multifocal adenopathy as well as nodules, left subcutaneous and in the abdominopelvic cavity.  Solid lesions within the pancreas and left kidney, either which could be site of primary.  Left adrenal nodule typically metastatic.  Small right inguinal hernia contains nonobstructed small bowel.  Question of pericolonic edema about the descending colon.   Patient was given 1 L normal saline.  EDP discussed with oncology, Dr. Bertis Ruddy, who recommended admission for further workup including IR biopsy and oncology will see in consultation.  The hospitalist service was consulted to admit.    Significant Events: Admitted 10/12/2023 for unknown metastatic disease on CT imaging with unknown primary   Significant Labs: Admission WBC 7.8, CK 1104, LDH 259, HgB 10.2, uric acid 6.9 Iron studies Iron 8(low), %sat 4 (low), TIBC 231(low) PSA 0.31 normal Ferritin 17 (low) Vitamin B12 627(normal)  Significant Imaging Studies: Admission CT head shows Mild left parietooccipital scalp hematoma  Admission CT c-spine shows No acute fracture or subluxation of the cervical spine. 2. Lobulated soft tissue masses/nodules in the left neck and supraclavicular region, suspicious for adenopathy. This is greater than typically seen with reactive changes. Findings are suspicious for metastatic disease or lymphoproliferative disorder such as lymphoma. Admission CT chest/abd/pelvis shows No evidence of acute traumatic injury in the chest, abdomen, or pelvis. 2. Findings consistent with neoplastic process/diffuse metastatic disease  in the chest, abdomen, and pelvis. Multifocal adenopathy as well as nodules, both subcutaneous and in the abdominopelvic cavity. Solid lesions are seen within the pancreas and left kidney, either of which  could be site of primary. Recommend oncologic workup. 3. Left adrenal nodule typically metastatic. 4. Small right inguinal hernia contains nonobstructed small bowel. 5. Question of pericolonic edema about the descending colon, nonspecific.  Antibiotic Therapy: Anti-infectives (From admission, onward)    None       Procedures: 10-15-2023 lymph node biopsy  Consultants: Oncology IR    Assessment and Plan: * Metastatic disease (HCC) 10-13-2023 heme/onc consulted. Will need tissue biopsy. Pt with enlarged left supraclavicular lymph node on exam. This may be the most accessible place to biopsy. Defer to heme/onc on diagnostic workup. 10-14-2023 plan for lymph node biopsy in AM. Will start D5 LR @ midnight to prevent any hypoglycemia that he had while he was NPO.  10-15-2023 LN biopsy today. Pt can have regular diet after biopsy and IVF stopped after biopsy.  Lymphadenopathy, generalized 10-13-2023 heme/onc consulted. Will need tissue biopsy. Pt with enlarged left supraclavicular lymph node on exam. This may be the most accessible place to biopsy. Defer to heme/onc on diagnostic workup.  10-14-2023 LN biopsy tomorrow.  10-15-2023 LN biopsy today.  Iron deficiency anemia 10-14-2023 iron studies show iron deficiency. Likely due to metastatic cancer Iron/TIBC/Ferritin/ %Sat    Component Value Date/Time   IRON 8 (L) 10/14/2023 0519   TIBC 231 (L) 10/14/2023 0519   FERRITIN 17 (L) 10/14/2023 0519   IRONPCTSAT 4 (L) 10/14/2023 0519   10-15-2023 start po nu-iron 150 mg daily.   Left kidney mass 10-13-2023 heme/onc consulted. Will need tissue biopsy. Pt with enlarged left supraclavicular lymph node on exam. This may be the most accessible place to biopsy. Defer to heme/onc on diagnostic workup.  10-14-2023 LN biopsy tomorrow.  10-15-2023 LN biopsy today.  Pancreatic mass 10-13-2023 heme/onc consulted. Will need tissue biopsy. Pt with enlarged left supraclavicular lymph node on  exam. This may be the most accessible place to biopsy. Defer to heme/onc on diagnostic workup.  10-14-2023 LN biopsy tomorrow.  10-15-2023 LN biopsy today.  Traumatic rhabdomyolysis (HCC) 10-13-2023 due to his fall and prolonged downtime. Initial CK 1104. Today CK down to 665 already. No evidence of renal failure.  10-14-2023 resolved.  Fall at home, initial encounter 10-13-2023 will need PT consult eventually. 10-14-2023 already seen by PT yesterday  10-15-2023 pt could walk 12 feet yesterday. C/o headache yesterday.  Mild persistent asthma without complication 10-13-2023 stable. Continue inhalers.  10-14-2023 stable.  10-15-2023 stable. Continue inhalers.  Benign essential HTN 10-13-2023 hold BP meds for now.  10-14-2023 stable off HTN meds.  10-15-2023 stable off HTN meds.  TBI (traumatic brain injury) (HCC) 10-13-2023 chronic. Pt has DSS guardian who is not at bedside. Reportedly this is Armed forces technical officer.  10-15-2023 chronic.       DVT prophylaxis: SCDs Start: 10/12/23 1948    Code Status: Full Code Family Communication: no family at bedside Disposition Plan: will need SNF placement Reason for continuing need for hospitalization: getting LN biopsy for cancer evaluation.  Objective: Vitals:   10/15/23 0541 10/15/23 0546 10/15/23 0820 10/15/23 0903  BP:  117/70 117/69   Pulse:  68 (!) 59 (!) 54  Resp:  18 16 16   Temp:  98.5 F (36.9 C) 97.6 F (36.4 C)   TempSrc:  Oral Oral   SpO2:  96% 100% 97%  Weight: 70.3 kg  Height:        Intake/Output Summary (Last 24 hours) at 10/15/2023 0912 Last data filed at 10/15/2023 0644 Gross per 24 hour  Intake 228.1 ml  Output 700 ml  Net -471.9 ml   Filed Weights   10/12/23 2043 10/15/23 0541  Weight: 67.1 kg 70.3 kg    Examination:  Physical Exam Vitals and nursing note reviewed.  HENT:     Head: Normocephalic.     Nose: Nose normal.  Eyes:     General: No scleral icterus. Cardiovascular:     Rate  and Rhythm: Normal rate and regular rhythm.  Pulmonary:     Effort: Pulmonary effort is normal.     Breath sounds: Normal breath sounds.  Abdominal:     General: Abdomen is flat.  Musculoskeletal:     Right lower leg: No edema.     Left lower leg: No edema.  Skin:    General: Skin is warm and dry.     Capillary Refill: Capillary refill takes less than 2 seconds.  Neurological:     General: No focal deficit present.     Mental Status: He is alert. He is disoriented.     Data Reviewed: I have personally reviewed following labs and imaging studies  CBC: Recent Labs  Lab 10/12/23 1352 10/13/23 0441  WBC 7.8 5.4  NEUTROABS 6.3  --   HGB 10.2* 8.5*  HCT 33.0* 28.3*  MCV 77.8* 77.5*  PLT 303 248   Basic Metabolic Panel: Recent Labs  Lab 10/12/23 1352 10/13/23 0441  NA 137 137  K 3.5 3.6  CL 103 106  CO2 24 23  GLUCOSE 85 119*  BUN 10 10  CREATININE 0.73 0.76  CALCIUM 8.9 8.2*   GFR: Estimated Creatinine Clearance: 80.6 mL/min (by C-G formula based on SCr of 0.76 mg/dL). Liver Function Tests: Recent Labs  Lab 10/12/23 1352  AST 51*  ALT 27  ALKPHOS 71  BILITOT 0.8  PROT 6.2*  ALBUMIN 3.1*   Cardiac Enzymes: Recent Labs  Lab 10/12/23 1352 10/13/23 0441  CKTOTAL 1,104* 665*   CBG: Recent Labs  Lab 10/13/23 0207 10/13/23 0256  GLUCAP 66* 155*   Anemia Panel: Recent Labs    10/14/23 0519  VITAMINB12 627  FERRITIN 17*  TIBC 231*  IRON 8*    Scheduled Meds:  iron polysaccharides  150 mg Oral QHS   mometasone-formoterol  2 puff Inhalation BID   mouth rinse  15 mL Mouth Rinse 4 times per day   Continuous Infusions:  dextrose 5% lactated ringers 50 mL/hr at 10/15/23 0419     LOS: 3 days   Time spent: 35 minutes  Carollee Herter, DO  [facility]  10/15/2023, 9:12 AM

## 2023-10-16 DIAGNOSIS — C778 Secondary and unspecified malignant neoplasm of lymph nodes of multiple regions: Secondary | ICD-10-CM | POA: Diagnosis not present

## 2023-10-16 DIAGNOSIS — N2889 Other specified disorders of kidney and ureter: Secondary | ICD-10-CM | POA: Diagnosis not present

## 2023-10-16 DIAGNOSIS — R591 Generalized enlarged lymph nodes: Secondary | ICD-10-CM | POA: Diagnosis not present

## 2023-10-16 DIAGNOSIS — D509 Iron deficiency anemia, unspecified: Secondary | ICD-10-CM | POA: Diagnosis not present

## 2023-10-16 MED ORDER — DOCUSATE SODIUM 100 MG PO CAPS
200.0000 mg | ORAL_CAPSULE | Freq: Two times a day (BID) | ORAL | Status: DC
  Administered 2023-10-16 – 2023-10-19 (×6): 200 mg via ORAL
  Filled 2023-10-16 (×6): qty 2

## 2023-10-16 MED ORDER — ADULT MULTIVITAMIN W/MINERALS CH
1.0000 | ORAL_TABLET | Freq: Every day | ORAL | Status: DC
  Administered 2023-10-16 – 2023-10-19 (×4): 1 via ORAL
  Filled 2023-10-16 (×4): qty 1

## 2023-10-16 MED ORDER — BISACODYL 5 MG PO TBEC
10.0000 mg | DELAYED_RELEASE_TABLET | Freq: Once | ORAL | Status: AC
  Administered 2023-10-16: 10 mg via ORAL
  Filled 2023-10-16: qty 2

## 2023-10-16 MED ORDER — ENOXAPARIN SODIUM 40 MG/0.4ML IJ SOSY
40.0000 mg | PREFILLED_SYRINGE | INTRAMUSCULAR | Status: DC
  Administered 2023-10-16 – 2023-10-19 (×4): 40 mg via SUBCUTANEOUS
  Filled 2023-10-16 (×4): qty 0.4

## 2023-10-16 MED ORDER — ENSURE ENLIVE PO LIQD
237.0000 mL | Freq: Two times a day (BID) | ORAL | Status: DC
  Administered 2023-10-16 – 2023-10-19 (×7): 237 mL via ORAL

## 2023-10-16 NOTE — Progress Notes (Addendum)
PROGRESS NOTE    [name]  LKG:[id number] DOB: 04-23-1951 DOA: 10/12/2023 PCP: [name], MD  Subjective: Pt seen and examined. Stable overnight. Had LN biopsy yesterday. Awaiting pathology. CEA 3.9(negative) CA 19-9  of 5 (negative)  Will need SNF at discharge. Pt has DSS legal guardian.   Hospital Course: HPI: [name] is a 72 y.o. male with medical history significant for TBI with cognitive impairment, asthma, HTN, chronic venous stasis who presented to the ED from home after a fall with prolonged downtime.   Patient states he lives alone.  He says he tripped and fell 2 days ago.  He hit his head on the floor and was stuck between his bed and the wall.  He follows with [facility] and when their transport team came to pick him up they found him on the ground.  Patient was complaining of neck and back pain.  EMS were called and he was brought to the ED for further evaluation.   ED Course  Labs/Imaging on admission: I have personally reviewed following labs and imaging studies.   Initial vitals showed BP 126/75, pulse 54, RR 20, temp 98.8 F, SpO2 98% on room air.   Labs showed CK 1104, WBC 7.8, hemoglobin 10.2, platelets 303,000, sodium 137, potassium 3.5, bicarb 24, BUN 10, creatinine 0.73, serum glucose 85, AST 51, ALT 27, alk phos 71, total bilirubin 0.8.  UA, uric acid, LDH pending collection.   CT head without contrast showed mild left parieto-occipital scalp hematoma.  No acute intracranial abnormality.  No skull fracture.  Atrophy, chronic small vessel ischemia, and remote infarcts noted.   CT cervical spine without contrast showed lobulated soft tissue masses/nodules in the left neck and supraclavicular region suspicious for adenopathy.  This is greater than typically seen with reactive changes since suspicious for metastatic disease or lymphoproliferative disorder such as lymphoma.  No acute fracture or subluxation of the C-spine.   CT chest/abdomen/pelvis  with contrast was negative for evidence of acute traumatic injury in the chest, abdomen, pelvis.  Findings consistent with neoplastic process/diffuse metastatic disease in the chest, abdomen, and pelvis noted.  Multifocal adenopathy as well as nodules, left subcutaneous and in the abdominopelvic cavity.  Solid lesions within the pancreas and left kidney, either which could be site of primary.  Left adrenal nodule typically metastatic.  Small right inguinal hernia contains nonobstructed small bowel.  Question of pericolonic edema about the descending colon.   Patient was given 1 L normal saline.  EDP discussed with oncology, Dr. Bertis Ruddy, who recommended admission for further workup including IR biopsy and oncology will see in consultation.  The hospitalist service was consulted to admit.    Significant Events: Admitted 10/12/2023 for unknown metastatic disease on CT imaging with unknown primary   Significant Labs: Admission WBC 7.8, CK 1104, LDH 259, HgB 10.2, uric acid 6.9 Iron studies Iron 8(low), %sat 4 (low), TIBC 231(low) PSA 0.31 normal Ferritin 17 (low) Vitamin B12 627(normal)  Significant Imaging Studies: Admission CT head shows Mild left parietooccipital scalp hematoma  Admission CT c-spine shows No acute fracture or subluxation of the cervical spine. 2. Lobulated soft tissue masses/nodules in the left neck and supraclavicular region, suspicious for adenopathy. This is greater than typically seen with reactive changes. Findings are suspicious for metastatic disease or lymphoproliferative disorder such as lymphoma. Admission CT chest/abd/pelvis shows No evidence of acute traumatic injury in the chest, abdomen, or pelvis. 2. Findings consistent with neoplastic process/diffuse metastatic disease in the chest,  abdomen, and pelvis. Multifocal adenopathy as well as nodules, both subcutaneous and in the abdominopelvic cavity. Solid lesions are seen within the pancreas and left kidney, either of  which could be site of primary. Recommend oncologic workup. 3. Left adrenal nodule typically metastatic. 4. Small right inguinal hernia contains nonobstructed small bowel. 5. Question of pericolonic edema about the descending colon, nonspecific.  Antibiotic Therapy: Anti-infectives (From admission, onward)    None       Procedures: 10-15-2023 lymph node biopsy  Consultants: Oncology IR    Assessment and Plan: * Metastatic disease (HCC) 10-13-2023 heme/onc consulted. Will need tissue biopsy. Pt with enlarged left supraclavicular lymph node on exam. This may be the most accessible place to biopsy. Defer to heme/onc on diagnostic workup. 10-14-2023 plan for lymph node biopsy in AM. Will start D5 LR @ midnight to prevent any hypoglycemia that he had while he was NPO.  10-15-2023 LN biopsy today. Pt can have regular diet after biopsy and IVF stopped after biopsy.  10-16-2023 LN biopsy yesterday. Pathology pending. Lab Results  Component Value Date/Time   CEA1 3.9 10/14/2023 05:19 AM   [id number] 5 10/14/2023 05:19 AM    Lymphadenopathy, generalized 10-13-2023 heme/onc consulted. Will need tissue biopsy. Pt with enlarged left supraclavicular lymph node on exam. This may be the most accessible place to biopsy. Defer to heme/onc on diagnostic workup.  10-14-2023 LN biopsy tomorrow.  10-15-2023 LN biopsy today.  10-16-2023 LN biopsy yesterday. Pathology pending. Lab Results  Component Value Date/Time   CEA1 3.9 10/14/2023 05:19 AM   [id number] 5 10/14/2023 05:19 AM    Iron deficiency anemia 10-14-2023 iron studies show iron deficiency. Likely due to metastatic cancer Iron/TIBC/Ferritin/ %Sat    Component Value Date/Time   IRON 8 (L) 10/14/2023 0519   TIBC 231 (L) 10/14/2023 0519   FERRITIN 17 (L) 10/14/2023 0519   IRONPCTSAT 4 (L) 10/14/2023 0519   10-15-2023 start po nu-iron 150 mg daily.   Left kidney mass 10-13-2023 heme/onc consulted. Will need tissue biopsy. Pt with  enlarged left supraclavicular lymph node on exam. This may be the most accessible place to biopsy. Defer to heme/onc on diagnostic workup.  10-14-2023 LN biopsy tomorrow.  10-15-2023 LN biopsy today.  10-16-2023 LN biopsy yesterday. Pathology pending.      Lab Results  Component Value Date/Time    CEA1 3.9 10/14/2023 05:19 AM    [id number] 5 10/14/2023 05:19 AM    Pancreatic mass 10-13-2023 heme/onc consulted. Will need tissue biopsy. Pt with enlarged left supraclavicular lymph node on exam. This may be the most accessible place to biopsy. Defer to heme/onc on diagnostic workup.  10-14-2023 LN biopsy tomorrow.  10-15-2023 LN biopsy today.  10-16-2023 LN biopsy yesterday. Pathology pending.      Lab Results  Component Value Date/Time    CEA1 3.9 10/14/2023 05:19 AM    [id number] 5 10/14/2023 05:19 AM    Traumatic rhabdomyolysis (HCC) 10-13-2023 due to his fall and prolonged downtime. Initial CK 1104. Today CK down to 665 already. No evidence of renal failure.  10-14-2023 resolved.  Fall at home, initial encounter 10-13-2023 will need PT consult eventually. 10-14-2023 already seen by PT yesterday  10-15-2023 pt could walk 12 feet yesterday. C/o headache yesterday.  10-16-2023 stable. Continue with PT. B12 level normal Lab Results  Component Value Date/Time   VITAMINB12 627 10/14/2023 05:19 AM     Mild persistent asthma without complication 10-13-2023 stable. Continue inhalers. 10-14-2023 stable. 10-15-2023 stable. Continue inhalers. 10-16-2023 stable.  Benign  essential HTN 10-13-2023 hold BP meds for now. 10-14-2023 stable off HTN meds. 10-15-2023 stable off HTN meds. 10-16-2023 stable off HTN meds.  TBI (traumatic brain injury) (HCC) 10-13-2023 chronic. Pt has DSS guardian who is not at bedside. Reportedly this is [employer] forces technical [employer].  10-15-2023 chronic.  Malnutrition of moderate degree 10-16-2023 see RD note dated 10-16-2023. "Moderate Malnutrition related to chronic  illness as evidenced by moderate fat depletion, moderate muscle depletion "   DVT prophylaxis: enoxaparin (LOVENOX) injection 40 mg Start: 10/16/23 1045 SCDs Start: 10/12/23 1948    Code Status: Full Code Family Communication: no family at bedside. Pt has DSS guardian Disposition Plan: SNF at discharge Reason for continuing need for hospitalization: awaiting pathology. Medically stable for DC to SNF. Pt can f/u with oncology in clinic.  Objective: Vitals:   10/15/23 2029 10/16/23 0613 10/16/23 0758 10/16/23 0804  BP: 120/69 (!) 119/95 117/71 121/72  Pulse: 75 67 62 86  Resp: 18 18  16   Temp: 98.2 F (36.8 C) 98 F (36.7 C)    TempSrc: Oral     SpO2: 98% 98% 99% 100%  Weight:      Height:        Intake/Output Summary (Last 24 hours) at 10/16/2023 1303 Last data filed at 10/15/2023 1307 Gross per 24 hour  Intake 240 ml  Output --  Net 240 ml   Filed Weights   10/12/23 2043 10/15/23 0541  Weight: 67.1 kg 70.3 kg    Examination:  Physical Exam Vitals and nursing note reviewed.  Constitutional:      General: He is not in acute distress.    Appearance: He is not toxic-appearing or diaphoretic.  HENT:     Head: Normocephalic and atraumatic.     Nose: Nose normal.  Cardiovascular:     Rate and Rhythm: Normal rate and regular rhythm.  Pulmonary:     Effort: Pulmonary effort is normal.     Breath sounds: Normal breath sounds.  Abdominal:     General: Abdomen is flat. Bowel sounds are normal.  Skin:    General: Skin is warm and dry.     Capillary Refill: Capillary refill takes less than 2 seconds.  Neurological:     Mental Status: He is alert. He is disoriented.     Data Reviewed: I have personally reviewed following labs and imaging studies  CBC: Recent Labs  Lab 10/12/23 1352 10/13/23 0441  WBC 7.8 5.4  NEUTROABS 6.3  --   HGB 10.2* 8.5*  HCT 33.0* 28.3*  MCV 77.8* 77.5*  PLT 303 248   Basic Metabolic Panel: Recent Labs  Lab 10/12/23 1352  10/13/23 0441  NA 137 137  K 3.5 3.6  CL 103 106  CO2 24 23  GLUCOSE 85 119*  BUN 10 10  CREATININE 0.73 0.76  CALCIUM 8.9 8.2*   GFR: Estimated Creatinine Clearance: 80.6 mL/min (by C-G formula based on SCr of 0.76 mg/dL). Liver Function Tests: Recent Labs  Lab 10/12/23 1352  AST 51*  ALT 27  ALKPHOS 71  BILITOT 0.8  PROT 6.2*  ALBUMIN 3.1*   Cardiac Enzymes: Recent Labs  Lab 10/12/23 1352 10/13/23 0441  CKTOTAL 1,104* 665*   CBG: Recent Labs  Lab 10/13/23 0207 10/13/23 0256  GLUCAP 66* 155*   Anemia Panel: Recent Labs    10/14/23 0519  VITAMINB12 627  FERRITIN 17*  TIBC 231*  IRON 8*    Radiology Studies: [place] CORE BIOPSY (LYMPH NODES)  Result Date: 10/15/2023 INDICATION:  [id number] Lymphadenopathy 100408 EXAM: ULTRASOUND-GUIDED LEFT SUPRACLAVICULAR LYMPH NODE BIOPSY COMPARISON:  CT CAP, 10/12/2023 MEDICATIONS: None ANESTHESIA/SEDATION: Local anesthetic was administered. COMPLICATIONS: None immediate. TECHNIQUE: Informed written consent was obtained from the patient and/or patient's representative after a discussion of the risks, benefits and alternatives to treatment. Questions regarding the procedure were encouraged and answered. Initial ultrasound scanning demonstrated pathologically-enlarged LEFT supraclavicular lymph node. An ultrasound image was saved for documentation purposes. The procedure was planned. A timeout was performed prior to the initiation of the procedure. The operative was prepped and draped in the usual sterile fashion, and a sterile drape was applied covering the operative field. A timeout was performed prior to the initiation of the procedure. Local anesthesia was provided with 1% lidocaine with epinephrine. Under direct ultrasound guidance, an 18 gauge core needle device was utilized to obtain to obtain 4 core needle biopsies of the LEFT supraclavicular lymph node. The samples were placed in saline and submitted to pathology. The needle was  removed and superficial hemostasis was achieved with manual compression. Post procedure scan was negative for significant hematoma. A dressing was applied. The patient tolerated the procedure well without immediate postprocedural complication. IMPRESSION: Successful ultrasound guided biopsy of a pathologically-enlarged LEFT supraclavicular lymph node. Roanna Banning, MD [facility]iology Electronically Signed   By: Roanna Banning M.D.   On: 10/15/2023 16:56    Scheduled Meds:  bisacodyl  10 mg Oral Once   docusate sodium  200 mg Oral BID   enoxaparin (LOVENOX) injection  40 mg Subcutaneous Q24H   feeding supplement  237 mL Oral BID BM   hydrocerin   Topical Daily   iron polysaccharides  150 mg Oral QHS   mometasone-formoterol  2 puff Inhalation BID   multivitamin with minerals  1 tablet Oral Daily   mouth rinse  15 mL Mouth Rinse 4 times per day   Continuous Infusions:   LOS: 4 days   Time spent: 35 minutes  Carollee Herter, DO  [facility]  10/16/2023, 1:03 PM

## 2023-10-16 NOTE — Assessment & Plan Note (Signed)
10-16-2023 see RD note dated 10-16-2023. "Moderate Malnutrition related to chronic illness as evidenced by moderate fat depletion, moderate muscle depletion "

## 2023-10-16 NOTE — Progress Notes (Signed)
Physical Therapy Treatment Patient Details Name: [name] MRN: [id number] DOB: [birth year] Today's Date: 10/16/2023   History of Present Illness 72 y.o. male presented 11/22 to the ED from home after a fall with prolonged downtime. Imaging with metastatic disease. Underwent lymph node biopsy 11/24. PMHx: TBI with cognitive impairment, asthma, HTN, chronic venous stasis.    PT Comments  Pt tolerates treatment well, ambulating for increased distances and requiring reduced physical assistance. Pt continues to benefit from verbal cues for transfer technique and DME management to improve safety when mobilizing. Pt is incontinent of drops of liquid stool throughout mobilization, he reports he feels he has firm stool that he is unable to expel. PT continues to recommend short term inpatient PT services at the time of discharge.    If plan is discharge home, recommend the following: A lot of help with bathing/dressing/bathroom;Assistance with cooking/housework;Direct supervision/assist for medications management;Direct supervision/assist for financial management;Assist for transportation;Help with stairs or ramp for entrance;Supervision due to cognitive status;A little help with walking and/or transfers   Can travel by private vehicle     Yes  Equipment Recommendations  None recommended by PT    Recommendations for Other Services       Precautions / Restrictions Precautions Precautions: Fall Restrictions Weight Bearing Restrictions: No     Mobility  Bed Mobility Overal bed mobility: Needs Assistance Bed Mobility: Supine to Sit     Supine to sit: Contact guard          Transfers Overall transfer level: Needs assistance Equipment used: Rolling walker (2 wheels) Transfers: Sit to/from Stand Sit to Stand: Contact guard assist           General transfer comment: verbal cues for hand placement and increased trunk flexion    Ambulation/Gait Ambulation/Gait assistance:  Min assist Gait Distance (Feet): 40 Feet (additional trials of 15' and 20') Assistive device: Rolling walker (2 wheels) Gait Pattern/deviations: Step-to pattern, Shuffle Gait velocity: reduced Gait velocity interpretation: <1.31 ft/sec, indicative of household ambulator   General Gait Details: short shuffling steps, reduced stance time on LLE with fatigue. Increased trunk flexion with anterior displacement of RW despite PT cues to maintain BOS closer to walker frame   Stairs             Wheelchair Mobility     Tilt Bed    Modified Rankin (Stroke Patients Only)       Balance Overall balance assessment: Needs assistance Sitting-balance support: No upper extremity supported, Feet supported Sitting balance-Leahy Scale: Fair     Standing balance support: Bilateral upper extremity supported, Reliant on assistive device for balance Standing balance-Leahy Scale: Poor                              Cognition Arousal: Alert Behavior During Therapy: WFL for tasks assessed/performed Overall Cognitive Status: History of cognitive impairments - at baseline                                 General Comments: prior TBI, reduced awareness of deficits and safety        Exercises      General Comments General comments (skin integrity, edema, etc.): VSS on RA, pt is incontinent of drops of liquid stool throughout ambulation      Pertinent Vitals/Pain Pain Assessment Pain Assessment: Faces Faces Pain Scale: Hurts even more Pain Location: buttocks Pain  Descriptors / Indicators: Sore Pain Intervention(s): Monitored during session    Home Living                          Prior Function            PT Goals (current goals can now be found in the care plan section) Acute Rehab PT Goals Patient Stated Goal: Feel better Progress towards PT goals: Progressing toward goals    Frequency    Min 1X/week      PT Plan       Co-evaluation              AM-PAC PT "6 Clicks" Mobility   Outcome Measure  Help needed turning from your back to your side while in a flat bed without using bedrails?: None Help needed moving from lying on your back to sitting on the side of a flat bed without using bedrails?: A Little Help needed moving to and from a bed to a chair (including a wheelchair)?: A Little Help needed standing up from a chair using your arms (e.g., wheelchair or bedside chair)?: A Little Help needed to walk in hospital room?: A Little Help needed climbing 3-5 steps with a railing? : Total 6 Click Score: 17    End of Session Equipment Utilized During Treatment: Gait belt Activity Tolerance: Patient tolerated treatment well Patient left: in chair;with call bell/phone within reach;with chair alarm set Nurse Communication: Mobility status PT Visit Diagnosis: Unsteadiness on feet (R26.81);Muscle weakness (generalized) (M62.81);History of falling (Z91.81);Difficulty in walking, not elsewhere classified (R26.2);Other symptoms and signs involving the nervous system (R29.898);Pain     Time: 1610-9604 PT Time Calculation (min) (ACUTE ONLY): 39 min  Charges:    $Gait Training: 23-37 mins $Therapeutic Activity: 8-22 mins PT General Charges $$ ACUTE PT VISIT: 1 Visit                     Arlyss Gandy, PT, DPT Acute Rehabilitation Office [phone]    Arlyss Gandy 10/16/2023, 11:31 AM

## 2023-10-16 NOTE — Progress Notes (Signed)
Initial Nutrition Assessment  DOCUMENTATION CODES:   Non-severe (moderate) malnutrition in context of chronic illness  INTERVENTION:  Conitnue current diet Ensure Plus High Protein po BID, each supplement provides 350 kcal and 20 grams of protein. Multivitamin/minerals   NUTRITION DIAGNOSIS:   Moderate Malnutrition related to chronic illness as evidenced by moderate fat depletion, moderate muscle depletion.    GOAL:   Patient will meet greater than or equal to 90% of their needs    MONITOR:   PO intake, Supplement acceptance, Weight trends  REASON FOR ASSESSMENT:   Malnutrition Screening Tool    ASSESSMENT:  72 y.o. M, admitted from home where he reports living alone after fall. During course in ED he was found to metastatic disease and admitted. Pmh significant for TBI, cognitive impairment, asthma, HTN, brain tumao, GERD, ETOH abuse. Pt setting up finishing up breakfast consuming 100% of it.  He stated that appetite good.  Does not recognize any weight loss. NKFA. Independent feeding ability. Stated he lives alone and is able to take care of self.   10-13-2023 heme/onc consulted. Will need tissue biopsy. Pt with enlarged left supraclavicular lymph node on exam. This may be the most accessible place to biopsy. Defer to heme/onc on diagnostic workup. 10-14-2023 plan for lymph node biopsy in AM.   Admit weight: 67.1 kg Current weight: 70.3 kg  Weight history:  10/15/23 70.3 kg  10/21/21 83.9 kg  08/01/21 81.6 kg  02/11/21 79.9 kg      Average Meal Intake:  100% intake x 1 recorded meals  Nutritionally Relevant Medications reviewed.  Labs Reviewed    NUTRITION - FOCUSED PHYSICAL EXAM:  Flowsheet Row Most Recent Value  Orbital Region Moderate depletion  Upper Arm Region Moderate depletion  Thoracic and Lumbar Region Moderate depletion  Buccal Region Moderate depletion  Temple Region Moderate depletion  Clavicle Bone Region Moderate depletion  Clavicle  and Acromion Bone Region Mild depletion  Scapular Bone Region Moderate depletion  Dorsal Hand Mild depletion  Patellar Region Mild depletion  Anterior Thigh Region Mild depletion  Posterior Calf Region Mild depletion  Edema (RD Assessment) Mild  Hair Reviewed  Eyes Reviewed  Mouth Reviewed  Skin Reviewed  Nails Reviewed       Diet Order:   Diet Order             Diet regular Fluid consistency: Thin  Diet effective now                   EDUCATION NEEDS:   Education needs have been addressed  Skin:  Skin Assessment: Skin Integrity Issues: Skin Integrity Issues:: Other (Comment) Other: venous stasis ulcer pretibial Left and Right  Last BM:  10/15/23  Height:   Ht Readings from Last 1 Encounters:  10/12/23 5' 7.5" (1.715 m)    Weight:   Wt Readings from Last 1 Encounters:  10/15/23 70.3 kg    Ideal Body Weight:     BMI:  Body mass index is 23.92 kg/m.  Estimated Nutritional Needs:   Kcal:  2200-2500 kcal/d  Protein:  95-110g/d  Fluid:  82ml/kcal    Jamelle Haring RDN, LDN Clinical Dietitian  RDN pager # available on Amion

## 2023-10-17 DIAGNOSIS — N2889 Other specified disorders of kidney and ureter: Secondary | ICD-10-CM | POA: Diagnosis not present

## 2023-10-17 DIAGNOSIS — R591 Generalized enlarged lymph nodes: Secondary | ICD-10-CM | POA: Diagnosis not present

## 2023-10-17 DIAGNOSIS — K8689 Other specified diseases of pancreas: Secondary | ICD-10-CM | POA: Diagnosis not present

## 2023-10-17 DIAGNOSIS — D509 Iron deficiency anemia, unspecified: Secondary | ICD-10-CM | POA: Diagnosis not present

## 2023-10-17 MED ORDER — POLYETHYLENE GLYCOL 3350 17 G PO PACK
17.0000 g | PACK | Freq: Two times a day (BID) | ORAL | Status: AC
  Administered 2023-10-17 – 2023-10-18 (×2): 17 g via ORAL
  Filled 2023-10-17 (×4): qty 1

## 2023-10-17 MED ORDER — FERROUS SULFATE 325 (65 FE) MG PO TABS
325.0000 mg | ORAL_TABLET | Freq: Every day | ORAL | Status: DC
  Administered 2023-10-17 – 2023-10-19 (×3): 325 mg via ORAL
  Filled 2023-10-17 (×3): qty 1

## 2023-10-17 MED ORDER — POTASSIUM CHLORIDE CRYS ER 20 MEQ PO TBCR
20.0000 meq | EXTENDED_RELEASE_TABLET | Freq: Two times a day (BID) | ORAL | Status: AC
  Administered 2023-10-17 (×2): 20 meq via ORAL
  Filled 2023-10-17 (×2): qty 1

## 2023-10-17 NOTE — Progress Notes (Signed)
Mobility Specialist Progress Note:   10/17/23 0933  Mobility  Activity Ambulated with assistance in room  Level of Assistance Minimal assist, patient does 75% or more  Assistive Device Front wheel walker  Distance Ambulated (ft) 24 ft  Activity Response Tolerated well  Mobility Referral Yes  $Mobility charge 1 Mobility  Mobility Specialist Start Time (ACUTE ONLY) [id number]  Mobility Specialist Stop Time (ACUTE ONLY) 0933  Mobility Specialist Time Calculation (min) (ACUTE ONLY) 8 min   Pt received in bed, agreeable to mobility session. MinA required to stand, CGA throughout ambulation with RW. Tolerated well, pt was incontinent of dropping liquid stool while ambulating. Returned pt back to bed, alarm on, call bell in reach. All needs met and nursing notified.   Feliciana Rossetti Mobility Specialist Please contact via Special educational needs teacher or  Rehab office at [phone]

## 2023-10-17 NOTE — Progress Notes (Signed)
[facility] PROGRESS NOTE    Progress Note  DONAVON GERHOLD  WCB:[id number] DOB: 05-12-1951 DOA: 10/12/2023 PCP: [name], MD     Brief Narrative:   KEYUR NOVOTNY is an 72 y.o. male past medical history of traumatic brain injury with cognitive impairment, essential hypertension comes in after a fall at home with prolonged downtime he stated he felt 2 days prior to admission and was not able to get up.  CT of the head showed left parietal occipital scalp hematoma no acute intracranial findings.  CT of the spine showed neck lymphadenopathy fracture or dislocation.  CT scan of the chest/abdomen pelvis showed no evidence of traumatic injury finding consistent of neoplastic diffuse metastatic disease throughout abdomen pelvis and chest solid lesion in the pancreas and left kidney, left adrenal nodule likely metastatic.  Collagen was consulted recommended IR biopsy   Significant studies: Admission CT head shows Mild left parietooccipital scalp hematoma  Admission CT c-spine shows No acute fracture or subluxation of the cervical spine. 2. Lobulated soft tissue masses/nodules in the left neck and supraclavicular region, suspicious for adenopathy. This is greater than typically seen with reactive changes. Findings are suspicious for metastatic disease or lymphoproliferative disorder such as lymphoma. Admission CT chest/abd/pelvis shows No evidence of acute traumatic injury in the chest, abdomen, or pelvis. 2. 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 Multifocal adenopathy as well as nodules, both subcutaneous and in the abdominopelvic cavity. Solid lesions are seen within the pancreas and left kidney, either of which could be site of primary. Recommend oncologic workup. 3. Left adrenal nodule typically metastatic. 4. Small right inguinal hernia contains nonobstructed small bowel. 5. Question of pericolonic edema about the  descending colon, nonspecific.    Assessment/Plan:   Metastatic disease/  Lymphadenopathy, generalized: Oncology was consulted scheduled for left supraclavicular lymph node biopsy on 10/15/2023. Currently awaiting skilled nursing facility placement. Biopsy results to be follow-up by oncology as an outpatient. Patient has never had a colonoscopy. CA 19-9 5, CEA of 3.9, PCR 31  Left kidney mass/pancreatic mass: Status post lymph node biopsy on 10/15/2023. Biopsy results to be followed by oncology as an outpatient.  Traumatic rhabdomyolysis: Started on IV fluids CK is trending down.  Fall at home: PT was consulted evaluated the patient will need rehab.  Mild persistent asthma: Continue inhalers currently stable.  Benign essential hypertension: Blood pressure stable off meds continue to monitor.  History of traumatic brain injury: Patient has DDS guardian Geraldine Solar.  Moderate protein caloric malnutrition: Continue diet and Ensure 3 times daily.  Iron deficiency anemia/microcytic anemia: Patient has never had a colonoscopy, ferritin of 17 will start on ferrous sulfate daily. No signs of overt bleeding.     DVT prophylaxis: lovenox Family Communication:none Status is: Inpatient Remains inpatient appropriate because: Metastatic disease of unknown primary    Code Status:     Code Status Orders  (From admission, onward)           Start     Ordered   10/12/23 1948  Full code  Continuous       Question:  By:  Answer:  Consent: discussion documented in EHR   10/12/23 1948           Code Status History     Date Active Date Inactive Code Status Order ID Comments User Context   10/22/2021 0147 10/26/2021 1715 Full Code [id number]  Anselm Jungling, DO ED   08/02/2021 1123  08/05/2021 2138 Full Code [id number]  Dellia Cloud, MD ED   02/08/2021 0731 02/11/2021 1912 Full Code [id number]  Claudean Severance, MD ED   12/01/2020 0004 12/02/2020 2357 Full Code [id number]  Yvette Rack, MD ED   11/26/2011 2134 11/30/2011 1823 Full Code [id number]  Ron Parker, MD ED         IV Access:   Peripheral IV   Procedures and diagnostic studies:   [place] CORE BIOPSY (LYMPH NODES)  Result Date: 10/15/2023 INDICATION: 100408 Lymphadenopathy 100408 EXAM: ULTRASOUND-GUIDED LEFT SUPRACLAVICULAR LYMPH NODE BIOPSY COMPARISON:  CT CAP, 10/12/2023 MEDICATIONS: None ANESTHESIA/SEDATION: Local anesthetic was administered. COMPLICATIONS: None immediate. TECHNIQUE: Informed written consent was obtained from the patient and/or patient's representative after a discussion of the risks, benefits and alternatives to treatment. Questions regarding the procedure were encouraged and answered. Initial ultrasound scanning demonstrated pathologically-enlarged LEFT supraclavicular lymph node. An ultrasound image was saved for documentation purposes. The procedure was planned. A timeout was performed prior to the initiation of the procedure. The operative was prepped and draped in the usual sterile fashion, and a sterile drape was applied covering the operative field. A timeout was performed prior to the initiation of the procedure. Local anesthesia was provided with 1% lidocaine with epinephrine. Under direct ultrasound guidance, an 18 gauge core needle device was utilized to obtain to obtain 4 core needle biopsies of the LEFT supraclavicular lymph node. The samples were placed in saline and submitted to pathology. The needle was removed and superficial hemostasis was achieved with manual compression. Post procedure scan was negative for significant hematoma. A dressing was applied. The patient tolerated the procedure well without immediate postprocedural complication. IMPRESSION: Successful ultrasound guided biopsy of a pathologically-enlarged LEFT supraclavicular lymph node. Roanna Banning, MD [facility] Electronically Signed   By: Roanna Banning M.D.    On: 10/15/2023 16:56     Medical Consultants:   None.   Subjective:    ROXAS SIGERS no complaints today.  Objective:    Vitals:   10/16/23 1528 10/16/23 1918 10/16/23 1957 10/17/23 0807  BP: 115/68  117/65 118/72  Pulse: 78  76 70  Resp: 15  18 17   Temp:   98.9 F (37.2 C)   TempSrc:   Oral   SpO2: 99% 98% 98% 98%  Weight:      Height:       SpO2: 98 %   Intake/Output Summary (Last 24 hours) at 10/17/2023 0928 Last data filed at 10/17/2023 0517 Gross per 24 hour  Intake --  Output 250 ml  Net -250 ml   Filed Weights   10/12/23 2043 10/15/23 0541  Weight: 67.1 kg 70.3 kg    Exam: General exam: In no acute distress. Respiratory system: Good air movement and clear to auscultation. Cardiovascular system: S1 & S2 heard, RRR. No JVD. Gastrointestinal system: Abdomen is nondistended, soft and nontender.  Extremities: No pedal edema. Skin: No rashes, lesions or ulcers  Data Reviewed:    Labs: Basic Metabolic Panel: Recent Labs  Lab 10/12/23 1352 10/13/23 0441  NA 137 137  K 3.5 3.6  CL 103 106  CO2 24 23  GLUCOSE 85 119*  BUN 10 10  CREATININE 0.73 0.76  CALCIUM 8.9 8.2*   GFR Estimated Creatinine Clearance: 80.6 mL/min (by C-G formula based on SCr of 0.76 mg/dL). Liver Function Tests: Recent Labs  Lab 10/12/23 1352  AST 51*  ALT 27  ALKPHOS 71  BILITOT 0.8  PROT 6.2*  ALBUMIN 3.1*   No results for input(s): "LIPASE", "AMYLASE" in the last 168 hours. No results for input(s): "AMMONIA" in the last 168 hours. Coagulation profile No results for input(s): "INR", "PROTIME" in the last 168 hours. COVID-19 Labs  No results for input(s): "DDIMER", "FERRITIN", "LDH", "CRP" in the last 72 hours.  Lab Results  Component Value Date   SARSCOV2NAA NEGATIVE 10/25/2021   SARSCOV2NAA NEGATIVE 10/21/2021   SARSCOV2NAA NEGATIVE 08/05/2021   SARSCOV2NAA NEGATIVE 02/08/2021    CBC: Recent Labs  Lab 10/12/23 1352 10/13/23 0441  WBC 7.8  5.4  NEUTROABS 6.3  --   HGB 10.2* 8.5*  HCT 33.0* 28.3*  MCV 77.8* 77.5*  PLT 303 248   Cardiac Enzymes: Recent Labs  Lab 10/12/23 1352 10/13/23 0441  CKTOTAL 1,104* 665*   BNP (last 3 results) No results for input(s): "PROBNP" in the last 8760 hours. CBG: Recent Labs  Lab 10/13/23 0207 10/13/23 0256  GLUCAP 66* 155*   D-Dimer: No results for input(s): "DDIMER" in the last 72 hours. Hgb A1c: No results for input(s): "HGBA1C" in the last 72 hours. Lipid Profile: No results for input(s): "CHOL", "HDL", "LDLCALC", "TRIG", "CHOLHDL", "LDLDIRECT" in the last 72 hours. Thyroid function studies: No results for input(s): "TSH", "T4TOTAL", "T3FREE", "THYROIDAB" in the last 72 hours.  Invalid input(s): "FREET3" Anemia work up: No results for input(s): "VITAMINB12", "FOLATE", "FERRITIN", "TIBC", "IRON", "RETICCTPCT" in the last 72 hours. Sepsis Labs: Recent Labs  Lab 10/12/23 1352 10/13/23 0441  WBC 7.8 5.4   Microbiology No results found for this or any previous visit (from the past 240 hour(s)).   Medications:    docusate sodium  200 mg Oral BID   enoxaparin (LOVENOX) injection  40 mg Subcutaneous Q24H   feeding supplement  237 mL Oral BID BM   hydrocerin   Topical Daily   iron polysaccharides  150 mg Oral QHS   mometasone-formoterol  2 puff Inhalation BID   multivitamin with minerals  1 tablet Oral Daily   mouth rinse  15 mL Mouth Rinse 4 times per day   Continuous Infusions:    LOS: 5 days   Marinda Elk  [facility]  10/17/2023, 9:28 AM

## 2023-10-17 NOTE — TOC Progression Note (Addendum)
Transition of Care [facility]) - Progression Note    Patient Details  Name: [name] MRN: [id number] Date of Birth: 05/11/1951  Transition of Care [facility]) CM/SW Contact  Laressa Bolinger A [place], [place] Phone Number: 10/17/2023, 10:51 AM  Clinical Narrative:     Update 11/27 1555 CSW was contacted by pt's guardian, Joe. Stated he accepted bed offer to [facility]. CSW updated [facility] regarding bed offer, they stated that Pt can DC Thursday or Friday to facility if medically stable for DC. Contact admissions phone to facilitate DC. Coordinated with pt's social worker at [facility], [phone]. Left VM with social work to provide update on discharge as there was no answer.   CSW contacted pt's legal guardian Gabriel Rung, [phone] to update him on pt's bed offer to [facility]. CSW left voicemail with contact information to reach back out to regarding discharge update. There was no answer. CSW left voicemail with contact information to reach back out to CSW. CSW reached out to pt's supervisor, French Ana, [phone] and left voicemail.   CSW contacted [facility], spoke with Nelma Rothman, bed availability to be determined based on holiday schedule. CSW also reached out to pt's social worker, Teacher, English as a foreign language at [facility] to update on bed offer to [facility]. She informed CSW that they can assist with transportation to outpatient treatment for pt at DC. Provider notified of possible placement. EDD to be determined.   TOC will continue to follow.   Expected Discharge Plan: Skilled Nursing Facility Barriers to Discharge: Continued Medical Work up  Expected Discharge Plan and Services In-house Referral: Clinical Social Work     Living arrangements for the past 2 months: Apartment                                       Social Determinants of Health (SDOH) Interventions SDOH Screenings   Food Insecurity: Patient Unable To Answer (10/13/2023)  Housing: Patient Unable To Answer (10/13/2023)  Transportation Needs: Patient  Unable To Answer (10/13/2023)  Utilities: Patient Unable To Answer (10/13/2023)  Tobacco Use: Low Risk  (10/14/2023)    Readmission Risk Interventions     No data to display

## 2023-10-18 DIAGNOSIS — R591 Generalized enlarged lymph nodes: Secondary | ICD-10-CM | POA: Diagnosis not present

## 2023-10-18 NOTE — Progress Notes (Signed)
[facility] PROGRESS NOTE    Progress Note  [name]  XLK:[id number] DOB: November 10, 1951 DOA: 10/12/2023 PCP: [name], MD     Brief Narrative:   [name] is an 72 y.o. male past medical history of traumatic brain injury with cognitive impairment, essential hypertension comes in after a fall at home with prolonged downtime he stated he felt 2 days prior to admission and was not able to get up.  CT of the head showed left parietal occipital scalp hematoma no acute intracranial findings.  CT of the spine showed neck lymphadenopathy fracture or dislocation.  CT scan of the chest/abdomen pelvis showed no evidence of traumatic injury finding consistent of neoplastic diffuse metastatic disease throughout abdomen pelvis and chest solid lesion in the pancreas and left kidney, left adrenal nodule likely metastatic.  Collagen was consulted recommended IR biopsy   Significant studies: Admission CT head shows Mild left parietooccipital scalp hematoma  Admission CT c-spine shows No acute fracture or subluxation of the cervical spine. 2. Lobulated soft tissue masses/nodules in the left neck and supraclavicular region, suspicious for adenopathy. This is greater than typically seen with reactive changes. Findings are suspicious for metastatic disease or lymphoproliferative disorder such as lymphoma. Admission CT chest/abd/pelvis shows No evidence of acute traumatic injury in the chest, abdomen, or pelvis. 2. 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 Multifocal adenopathy as well as nodules, both subcutaneous and in the abdominopelvic cavity. Solid lesions are seen within the pancreas and left kidney, either of which could be site of primary. Recommend oncologic workup. 3. Left adrenal nodule typically metastatic. 4. Small right inguinal hernia contains nonobstructed small bowel. 5. Question of pericolonic edema about the  descending colon, nonspecific.    Assessment/Plan:   Metastatic disease/  Lymphadenopathy, generalized: Oncology was consulted scheduled for left supraclavicular lymph node biopsy on 10/15/2023. Currently awaiting skilled nursing facility placement. Biopsy results to be follow-up by oncology as an outpatient. Patient has never had a colonoscopy. CA 19-9 5, CEA of 3.9, PCR 31. Awaiting skilled facility placement determining an bed availability during holiday schedule.  Left kidney mass/pancreatic mass: Status post lymph node biopsy on 10/15/2023. Biopsy results to be followed by oncology as an outpatient.  Traumatic rhabdomyolysis: Resolved with IV fluids.  Fall at home: PT was consulted evaluated the patient will need rehab.  Mild persistent asthma: Continue inhalers currently stable.  Benign essential hypertension: Blood pressure stable off meds continue to monitor.  History of traumatic brain injury: Patient has DDS guardian Geraldine Solar.  Moderate protein caloric malnutrition: Continue diet and Ensure 3 times daily.  Iron deficiency anemia/microcytic anemia: Patient has never had a colonoscopy, ferritin of 17 will start on ferrous sulfate daily. No signs of overt bleeding.     DVT prophylaxis: lovenox Family Communication:none Status is: Inpatient Remains inpatient appropriate because: Metastatic disease of unknown primary    Code Status:     Code Status Orders  (From admission, onward)           Start     Ordered   10/12/23 1948  Full code  Continuous       Question:  By:  Answer:  Consent: discussion documented in EHR   10/12/23 1948           Code Status History     Date Active Date Inactive Code Status Order ID Comments User Context   10/22/2021 0147 10/26/2021 1715 Full Code [id number]  Benita Gutter  T, DO ED   08/02/2021 1123 08/05/2021 2138 Full Code [id number]  Dellia Cloud, MD ED   02/08/2021 0731 02/11/2021 1912 Full Code [id number]  Claudean Severance, MD ED   12/01/2020 0004 12/02/2020 2357 Full Code [id number]  Yvette Rack, MD ED   11/26/2011 2134 11/30/2011 1823 Full Code [id number]  Ron Parker, MD ED         IV Access:   Peripheral IV   Procedures and diagnostic studies:   No results found.   Medical Consultants:   None.   Subjective:    [name] relates food was good this morning has no new complaints.  Objective:    Vitals:   10/17/23 1425 10/17/23 2125 10/18/23 0522 10/18/23 0742  BP: 121/75 117/70 121/72 106/67  Pulse: 68 66 68 60  Resp: 16 18 16 18   Temp:  97.7 F (36.5 C) 98.4 F (36.9 C) 99.1 F (37.3 C)  TempSrc:  Oral Oral Oral  SpO2: 100% 98% 95% 97%  Weight:      Height:       SpO2: 97 %   Intake/Output Summary (Last 24 hours) at 10/18/2023 0906 Last data filed at 10/18/2023 0823 Gross per 24 hour  Intake --  Output 300 ml  Net -300 ml   Filed Weights   10/12/23 2043 10/15/23 0541  Weight: 67.1 kg 70.3 kg    Exam: General exam: In no acute distress. Respiratory system: Good air movement and clear to auscultation. Cardiovascular system: S1 & S2 heard, RRR. No JVD. Gastrointestinal system: Abdomen is nondistended, soft and nontender.  Extremities: No pedal edema. Skin: No rashes, lesions or ulcers  Data Reviewed:    Labs: Basic Metabolic Panel: Recent Labs  Lab 10/12/23 1352 10/13/23 0441  NA 137 137  K 3.5 3.6  CL 103 106  CO2 24 23  GLUCOSE 85 119*  BUN 10 10  CREATININE 0.73 0.76  CALCIUM 8.9 8.2*   GFR Estimated Creatinine Clearance: 80.6 mL/min (by C-G formula based on SCr of 0.76 mg/dL). Liver Function Tests: Recent Labs  Lab 10/12/23 1352  AST 51*  ALT 27  ALKPHOS 71  BILITOT 0.8  PROT 6.2*  ALBUMIN 3.1*   No results for input(s): "LIPASE", "AMYLASE" in the last 168 hours. No results for input(s): "AMMONIA" in the last 168 hours. Coagulation profile No results for input(s): "INR", "PROTIME" in the last 168  hours. COVID-19 Labs  No results for input(s): "DDIMER", "FERRITIN", "LDH", "CRP" in the last 72 hours.  Lab Results  Component Value Date   SARSCOV2NAA NEGATIVE 10/25/2021   SARSCOV2NAA NEGATIVE 10/21/2021   SARSCOV2NAA NEGATIVE 08/05/2021   SARSCOV2NAA NEGATIVE 02/08/2021    CBC: Recent Labs  Lab 10/12/23 1352 10/13/23 0441  WBC 7.8 5.4  NEUTROABS 6.3  --   HGB 10.2* 8.5*  HCT 33.0* 28.3*  MCV 77.8* 77.5*  PLT 303 248   Cardiac Enzymes: Recent Labs  Lab 10/12/23 1352 10/13/23 0441  CKTOTAL 1,104* 665*   BNP (last 3 results) No results for input(s): "PROBNP" in the last 8760 hours. CBG: Recent Labs  Lab 10/13/23 0207 10/13/23 0256  GLUCAP 66* 155*   D-Dimer: No results for input(s): "DDIMER" in the last 72 hours. Hgb A1c: No results for input(s): "HGBA1C" in the last 72 hours. Lipid Profile: No results for input(s): "CHOL", "HDL", "LDLCALC", "TRIG", "CHOLHDL", "LDLDIRECT" in the last 72 hours. Thyroid function studies: No results for input(s): "TSH", "T4TOTAL", "T3FREE", "THYROIDAB" in the last 72  hours.  Invalid input(s): "FREET3" Anemia work up: No results for input(s): "VITAMINB12", "FOLATE", "FERRITIN", "TIBC", "IRON", "RETICCTPCT" in the last 72 hours. Sepsis Labs: Recent Labs  Lab 10/12/23 1352 10/13/23 0441  WBC 7.8 5.4   Microbiology No results found for this or any previous visit (from the past 240 hour(s)).   Medications:    docusate sodium  200 mg Oral BID   enoxaparin (LOVENOX) injection  40 mg Subcutaneous Q24H   feeding supplement  237 mL Oral BID BM   ferrous sulfate  325 mg Oral Daily   hydrocerin   Topical Daily   iron polysaccharides  150 mg Oral QHS   mometasone-formoterol  2 puff Inhalation BID   multivitamin with minerals  1 tablet Oral Daily   mouth rinse  15 mL Mouth Rinse 4 times per day   polyethylene glycol  17 g Oral BID   Continuous Infusions:    LOS: 6 days   Marinda Elk  [facility]  10/18/2023, 9:06 AM

## 2023-10-18 NOTE — Plan of Care (Signed)
  Problem: Clinical Measurements: Goal: Will remain free from infection Outcome: Progressing   Problem: Activity: Goal: Risk for activity intolerance will decrease Outcome: Progressing   Problem: Nutrition: Goal: Adequate nutrition will be maintained Outcome: Progressing   Problem: Coping: Goal: Level of anxiety will decrease Outcome: Progressing   

## 2023-10-19 DIAGNOSIS — W19XXXA Unspecified fall, initial encounter: Secondary | ICD-10-CM

## 2023-10-19 DIAGNOSIS — N2889 Other specified disorders of kidney and ureter: Secondary | ICD-10-CM | POA: Diagnosis not present

## 2023-10-19 DIAGNOSIS — C8590 Non-Hodgkin lymphoma, unspecified, unspecified site: Secondary | ICD-10-CM | POA: Diagnosis not present

## 2023-10-19 DIAGNOSIS — K8689 Other specified diseases of pancreas: Secondary | ICD-10-CM | POA: Diagnosis not present

## 2023-10-19 MED ORDER — FERROUS SULFATE 325 (65 FE) MG PO TABS: 325.0000 mg | ORAL_TABLET | Freq: Every day | ORAL | Status: AC

## 2023-10-19 NOTE — Progress Notes (Signed)
Mobility Specialist Progress Note:    10/19/23 0945  Mobility  Activity Ambulated with assistance in room;Ambulated with assistance to bathroom  Level of Assistance Contact guard assist, steadying assist  Assistive Device Front wheel walker  Distance Ambulated (ft) 25 ft  Activity Response Tolerated well  Mobility Referral Yes  $Mobility charge 1 Mobility  Mobility Specialist Start Time (ACUTE ONLY) 0945  Mobility Specialist Stop Time (ACUTE ONLY) 1000  Mobility Specialist Time Calculation (min) (ACUTE ONLY) 15 min   Pt received in bed, agreeable to mobility session. Found pt sitting in stool, pt's bed soiled, assisted in ambulating to bathroom via RW and CGA. Notified staff. Pt unable to void in bathroom, assisted with peri care from previous BM. Transferred pt back to bed, all needs met, call bell in reach.   Feliciana Rossetti Mobility Specialist Please contact via Special educational needs teacher or  Rehab office at [phone]

## 2023-10-19 NOTE — Plan of Care (Signed)

## 2023-10-19 NOTE — TOC Transition Note (Addendum)
Transition of Care [facility]) - CM/SW Discharge Note   Patient Details  Name: [name] MRN: [id number] Date of Birth: Mar 30, 1951  [facility]) CM/SW Contact:  Sharrieff Spratlin A [place], Theresia Majors Phone Number: 10/19/2023, 12:58 PM   Clinical Narrative:     Patient will DC to: [facility]  Anticipated DC date: 10/19/23  Family notified: Geraldine Solar, legal guardian  Transport by: [facility] service      Per MD patient ready for DC to [facility] . RN, patient, patient's family, and facility notified of DC. Discharge Summary and FL2 sent to facility. RN to call report prior to discharge (room 307, [phone]). DC packet on chart.    CSW will sign off for now as social work intervention is no longer needed. Please consult [place] again if new needs arise.   Final next level of care: Skilled Nursing Facility Barriers to Discharge: Barriers Resolved   Patient Goals and CMS Choice   Choice offered to / list presented to :  (Legal Guardian)  Discharge Placement                Patient chooses bed at: [facility] Patient to be transferred to facility by: [facility] Name of family member notified: Geraldine Solar, legal guardian Patient and family notified of of transfer: 10/19/23  Discharge Plan and Services Additional resources added to the After Visit Summary for   In-house Referral: Clinical Social Work                                   Social Determinants of Health (SDOH) Interventions SDOH Screenings   Food Insecurity: Patient Unable To Answer (10/13/2023)  Housing: Patient Unable To Answer (10/13/2023)  Transportation Needs: Patient Unable To Answer (10/13/2023)  Utilities: Patient Unable To Answer (10/13/2023)  Tobacco Use: Low Risk  (10/14/2023)     Readmission Risk Interventions     No data to display

## 2023-10-19 NOTE — Progress Notes (Signed)
Pt picked up by Wheelchair transport. Stable condition. All belongings, just clothes, in bag sent with patient. Patient placed in paper scrubs due to wet clothing.

## 2023-10-19 NOTE — Discharge Summary (Signed)
Physician Discharge Summary  [name] QMV:[id number] DOB: 13-Feb-1951 DOA: 10/12/2023  PCP: [name], MD  Admit date: 10/12/2023 Discharge date: 10/19/2023  Admitted From: Home Disposition:  Home  Recommendations for Outpatient Follow-up:  Follow up with PCP in 1-2 weeks, will need a colonoscopy as an outpatient. Please obtain BMP/CBC in one week  Home Health:No Equipment/Devices:None  Discharge Condition:Stable CODE STATUS:Full  Diet recommendation: Heart Healthy   Brief/Interim Summary: 72 y.o. male past medical history of traumatic brain injury with cognitive impairment, essential hypertension comes in after a fall at home with prolonged downtime he stated he felt 2 days prior to admission and was not able to get up.  CT of the head showed left parietal occipital scalp hematoma no acute intracranial findings.  CT of the spine showed neck lymphadenopathy fracture or dislocation.  CT scan of the chest/abdomen pelvis showed no evidence of traumatic injury finding consistent of neoplastic diffuse metastatic disease throughout abdomen pelvis and chest solid lesion in the pancreas and left kidney, left adrenal nodule likely metastatic.  Collagen was consulted recommended IR biopsy     Significant studies: Admission CT head shows Mild left parietooccipital scalp hematoma  Admission CT c-spine shows No acute fracture or subluxation of the cervical spine. 2. Lobulated soft tissue masses/nodules in the left neck and supraclavicular region, suspicious for adenopathy. This is greater than typically seen with reactive changes. Findings are suspicious for metastatic disease or lymphoproliferative disorder such as lymphoma. Admission CT chest/abd/pelvis shows No evidence of acute traumatic injury in the chest, abdomen, or pelvis. 2. 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 Multifocal adenopathy as well as nodules, both  subcutaneous and in the abdominopelvic cavity. Solid lesions are seen within the pancreas and left kidney, either of which could be site of primary. Recommend oncologic workup. 3. Left adrenal nodule typically metastatic. 4. Small right inguinal hernia contains nonobstructed small bowel. 5. Question of pericolonic edema about the descending colon, nonspecific.  Discharge Diagnoses:  Principal Problem:   Metastatic disease (HCC) Active Problems:   Lymphadenopathy, generalized   Fall at home, initial encounter   Traumatic rhabdomyolysis (HCC)   Pancreatic mass   Left kidney mass   Iron deficiency anemia   TBI (traumatic brain injury) (HCC)   Benign essential HTN   Mild persistent asthma without complication   Malnutrition of moderate degree  Metastatic disease of unknown source/lymphadenopathy generalized, Oncology was consulted and he is scheduled for supraclavicular lymph node biopsy on 10/15/2023. Biopsy results to be follow-up with oncology as an outpatient. Patient has never had a colonoscopy. CA 19 was 5 CEA was 3.9, PCR was 31. PT evaluated the patient he will go to the nursing facility.  Left kidney mass/pancreatic mass: Status post lymph node biopsy on 10/15/2023. Biopsy results will follow-up in college as an outpatient.  Traumatic rhabdomyolysis: Resolved with IV fluids.  Fall at home. PT evaluated the patient, will need skilled nursing facility.  Persistent asthma mild: Continue inhalers remained stable.  Benign essential hypertension: All antihypertensive medications were discontinued his blood pressure has remained stable.  History of traumatic brain injury: Guardian has been updated.  Moderate protein caloric malnutrition: Ensure 3 times daily.  Iron deficiency anemia/microcytic anemia: The patient never had a colonoscopy, ferritin was 17 he was started for sulfate daily. No signs of overt bleeding. He will follow-up with PCP as an outpatient  Discharge  Instructions  Discharge Instructions     Diet - low sodium  heart healthy   Complete by: As directed    Increase activity slowly   Complete by: As directed    No wound care   Complete by: As directed       Allergies as of 10/19/2023   No Known Allergies      Medication List     STOP taking these medications    chlorhexidine 0.12 % solution Commonly known as: PERIDEX   doxycycline 100 MG EC tablet Commonly known as: DORYX   feeding supplement Liqd   guaiFENesin 600 MG 12 hr tablet Commonly known as: MUCINEX   hydrocortisone cream 1 %   loperamide 2 MG tablet Commonly known as: IMODIUM A-D   losartan 50 MG tablet Commonly known as: COZAAR   PreviDent 5000 Booster Plus 1.1 % Pste Generic drug: Sodium Fluoride   traMADol 50 MG tablet Commonly known as: ULTRAM       TAKE these medications    acetaminophen 500 MG tablet Commonly known as: TYLENOL Take 500 mg by mouth every 8 (eight) hours as needed for headache or mild pain.   albuterol 108 (90 Base) MCG/ACT inhaler Commonly known as: VENTOLIN HFA Inhale 1-2 puffs into the lungs every 6 (six) hours as needed for wheezing or shortness of breath.   ascorbic acid 250 MG tablet Commonly known as: VITAMIN C Take 1 tablet (250 mg total) by mouth 2 (two) times daily.   atorvastatin 20 MG tablet Commonly known as: LIPITOR Take 20 mg by mouth daily.   budesonide-formoterol 160-4.5 MCG/ACT inhaler Commonly known as: SYMBICORT Inhale 2 puffs into the lungs 2 (two) times daily.   ferrous sulfate 325 (65 FE) MG tablet Take 1 tablet (325 mg total) by mouth daily. Start taking on: October 20, 2023   ketoconazole 2 % shampoo Commonly known as: NIZORAL Apply 1 application topically 2 (two) times a week.   mirabegron ER 25 MG Tb24 tablet Commonly known as: MYRBETRIQ Take 25 mg by mouth daily.   multivitamin with minerals Tabs tablet Take 1 tablet by mouth daily.   tamsulosin 0.4 MG Caps  capsule Commonly known as: FLOMAX Take 0.4 mg by mouth daily.   Vitamin D (Ergocalciferol) 1.25 MG (50000 UNIT) Caps capsule Commonly known as: DRISDOL Take 50,000 Units by mouth every 30 (thirty) days.        No Known Allergies  Consultations: Interventional radiology   Procedures/Studies: [place] CORE BIOPSY (LYMPH NODES)  Result Date: 10/15/2023 INDICATION: 100408 Lymphadenopathy 100408 EXAM: ULTRASOUND-GUIDED LEFT SUPRACLAVICULAR LYMPH NODE BIOPSY COMPARISON:  CT CAP, 10/12/2023 MEDICATIONS: None ANESTHESIA/SEDATION: Local anesthetic was administered. COMPLICATIONS: None immediate. TECHNIQUE: Informed written consent was obtained from the patient and/or patient's representative after a discussion of the risks, benefits and alternatives to treatment. Questions regarding the procedure were encouraged and answered. Initial ultrasound scanning demonstrated pathologically-enlarged LEFT supraclavicular lymph node. An ultrasound image was saved for documentation purposes. The procedure was planned. A timeout was performed prior to the initiation of the procedure. The operative was prepped and draped in the usual sterile fashion, and a sterile drape was applied covering the operative field. A timeout was performed prior to the initiation of the procedure. Local anesthesia was provided with 1% lidocaine with epinephrine. Under direct ultrasound guidance, an 18 gauge core needle device was utilized to obtain to obtain 4 core needle biopsies of the LEFT supraclavicular lymph node. The samples were placed in saline and submitted to pathology. The needle was removed and superficial hemostasis was achieved with manual compression. Post procedure  scan was negative for significant hematoma. A dressing was applied. The patient tolerated the procedure well without immediate postprocedural complication. IMPRESSION: Successful ultrasound guided biopsy of a pathologically-enlarged LEFT supraclavicular lymph node.  Roanna Banning, MD [facility] Electronically Signed   By: Roanna Banning M.D.   On: 10/15/2023 16:56   CT CHEST ABDOMEN PELVIS W CONTRAST  Result Date: 10/12/2023 CLINICAL DATA:  Blunt trauma.  Status post unwitnessed fall. EXAM: CT CHEST, ABDOMEN, AND PELVIS WITH CONTRAST TECHNIQUE: Multidetector CT imaging of the chest, abdomen and pelvis was performed following the standard protocol during bolus administration of intravenous contrast. RADIATION DOSE REDUCTION: This exam was performed according to the departmental dose-optimization program which includes automated exposure control, adjustment of the mA and/or kV according to patient size and/or use of iterative reconstruction technique. CONTRAST:  75mL OMNIPAQUE IOHEXOL 350 MG/ML SOLN COMPARISON:  Chest radiograph 10/22/2021. FINDINGS: CT CHEST FINDINGS Cardiovascular: No evidence of acute aortic or vascular injury. The heart is normal in size. No pericardial effusion. Mediastinum/Nodes: No evidence of mediastinal hemorrhage or hematoma. Enlarged right axillary node, 19 mm short axis series 3, image 17. Left supraclavicular soft tissue nodule measures 18 mm series 3, image 8. L non-specific 8 mm anterior mediastinal node series 3, image 35. Small hiatal hernia. Eft carotid node measuring 14 mm series 3, image 12. Lungs/Pleura: No pneumothorax or pulmonary contusion. No pleural effusion. No pulmonary mass. Tiny punctate subpleural right lower lobe nodule series 4, image 112, nonspecific. Musculoskeletal: Scattered Schmorl's nodes in the thoracic spine. No acute thoracic fracture. No evidence of acute fracture of the ribs, sternum, included clavicles or shoulder girdles. Suspect remote right proximal humerus fracture. Lobulated subcutaneous nodule posterior left upper arm/periscapular region measuring 2.8 cm series 3, image 28. Left lateral chest wall node/nodule measuring 12 mm series 3, image 39. CT  ABDOMEN PELVIS FINDINGS Hepatobiliary: No hepatic injury or perihepatic hematoma. No evidence of focal liver lesion. Mild motion artifact and streak artifact from right arm down positioning. Gallbladder is unremarkable. Pancreas: No evidence of injury. 2.5 cm suspected mass in the pancreatic head series 3, image 69. This does not approach the superior mesenteric artery or vein on provided view. No peripancreatic inflammation. Spleen: No splenic injury or perisplenic hematoma. Adrenals/Urinary Tract: No adrenal hemorrhage. There is a heterogeneous 1.5 cm left adrenal nodule. No evidence of renal injury. Solid mass arising from the upper anterior left kidney measures 4 x 3.4 cm, series 3, image 66. No hydronephrosis. No bladder wall thickening Stomach/Bowel: No evidence of bowel injury or mesenteric hematoma. Moderate stool in the right colon. Question of pericolonic edema about the descending colon, series 3, image 91. Small right inguinal hernia contains nonobstructed small bowel. Normal appendix visualized. There is no small bowel dilatation or obvious small bowel mass. Small hiatal hernia. Vascular/Lymphatic: No evidence is acute vascular injury. No retroperitoneal fluid. Aortic atherosclerosis without aneurysm. Multifocal adenopathy in soft tissue nodularity throughout the abdomen and pelvis. Representative nodule in the right upper quadrant measures 18 mm series 3, image 54, posterior to the liver and above the adrenal gland. There right perinephric nodules, series 3, image 67 and 68. Left perinephric nodule series 3, image 72. Left retroperitoneal adenopathy, node measuring 18 mm short axis at the level of the left kidney series 3, image 71. There is aortocaval adenopathy at similar level. Left lower quadrant nodule medial to the descending colon measuring 19 mm series 3, image 86. There additional scattered nodules and soft tissue deposits.  17 mm right inguinal node series 3, image 122. Additional enlarged  right inguinal nodes and left inguinal adenopathy. There are left perirectal node series 3 images 111 and 113. Reproductive: Mildly prominent prostate. Other: Soft tissue nodule, for example series 3, image 103 in the right gluteal/flank region. Mild generalized stranding of the intra-abdominal fat which is nonspecific. No convincing ascites. No free air. Musculoskeletal: No evidence of acute fracture. Mild L1 compression deformity with superior endplate Schmorl's node has a chronic appearance. No evidence of focal bone lesion. IMPRESSION: 1. No evidence of acute traumatic injury in the chest, abdomen, or pelvis. 2. 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 Multifocal adenopathy as well as nodules, both subcutaneous and in the abdominopelvic cavity. Solid lesions are seen within the pancreas and left kidney, either of which could be site of primary. Recommend oncologic workup. 3. Left adrenal nodule typically metastatic. 4. Small right inguinal hernia contains nonobstructed small bowel. 5. Question of pericolonic edema about the descending colon, nonspecific. Aortic Atherosclerosis (ICD10-I70.0). Electronically Signed   By: Narda Rutherford M.D.   On: 10/12/2023 17:29   CT CERVICAL SPINE WO CONTRAST  Result Date: 10/12/2023 CLINICAL DATA:  Neck trauma (Age >= 65y) Unwitnessed fall. EXAM: CT CERVICAL SPINE WITHOUT CONTRAST TECHNIQUE: Multidetector CT imaging of the cervical spine was performed without intravenous contrast. Multiplanar CT image reconstructions were also generated. RADIATION DOSE REDUCTION: This exam was performed according to the departmental dose-optimization program which includes automated exposure control, adjustment of the mA and/or kV according to patient size and/or use of iterative reconstruction technique. COMPARISON:  None Available. FINDINGS: Alignment: Normal. Skull base and vertebrae: No acute fracture. Vertebral body  heights are maintained. The dens and skull base are intact. Soft tissues and spinal canal: No prevertebral fluid or swelling. No visible canal hematoma. Lobulated soft tissue mass in the left neck measures 2.4 x 2.3 cm, series 9, image 76. Smaller adjacent soft tissue deposit, series 9, image 70. 18 mm soft tissue deposit in the left supraclavicular space series 9, image 80. Disc levels: Mild anterior spurring at multiple levels with preservation of disc spaces. Mild scattered facet hypertrophy. Upper chest: Assessed on concurrent chest CT, reported separately. Other: Carotid calcifications. IMPRESSION: 1. No acute fracture or subluxation of the cervical spine. 2. Lobulated soft tissue masses/nodules in the left neck and supraclavicular region, suspicious for adenopathy. This is greater than typically seen with reactive changes. Findings are suspicious for metastatic disease or lymphoproliferative disorder such as lymphoma. Electronically Signed   By: Narda Rutherford M.D.   On: 10/12/2023 17:12   CT HEAD WO CONTRAST ( )  Result Date: 10/12/2023 CLINICAL DATA:  Head trauma, minor (Age >= 65y) Unwitnessed fall. EXAM: CT HEAD WITHOUT CONTRAST TECHNIQUE: Contiguous axial images were obtained from the base of the skull through the vertex without intravenous contrast. RADIATION DOSE REDUCTION: This exam was performed according to the departmental dose-optimization program which includes automated exposure control, adjustment of the mA and/or kV according to patient size and/or use of iterative reconstruction technique. COMPARISON:  10/21/2021 FINDINGS: Brain: No evidence of acute infarction, hemorrhage, hydrocephalus, extra-axial collection or mass lesion/mass effect. Generalized atrophy. Again seen cavum septum pellucidum. Moderate to advanced periventricular and deep white matter hypodensity typical of chronic small vessel ischemia. Remote right frontal and left temporal infarcts. Vascular: Atherosclerosis of  skullbase vasculature without hyperdense vessel or abnormal calcification. Skull: No fracture or focal lesion. Sinuses/Orbits: No acute findings. Surgical change in the right superior  orbit. Mucosal thickening throughout the ethmoid air cells, chronic. No mastoid effusion. Other: Mild left parietooccipital scalp hematoma IMPRESSION: 1. Mild left parietooccipital scalp hematoma. No acute intracranial abnormality. No skull fracture. 2. Atrophy, chronic small vessel ischemia, and remote infarcts. Electronically Signed   By: Narda Rutherford M.D.   On: 10/12/2023 17:04   (Echo, Carotid, EGD, Colonoscopy, ERCP)    Subjective: No complaints  Discharge Exam: Vitals:   10/19/23 0814 10/19/23 0838  BP: 109/66   Pulse: 61   Resp: 18   Temp: 98 F (36.7 C)   SpO2: 97% 98%   Vitals:   10/18/23 2047 10/19/23 0428 10/19/23 0814 10/19/23 0838  BP: 112/67 123/76 109/66   Pulse: 71 66 61   Resp: 17 18 18    Temp: 98.4 F (36.9 C)  98 F (36.7 C)   TempSrc: Oral     SpO2: 98% 98% 97% 98%  Weight:      Height:        General: Pt is alert, awake, not in acute distress Cardiovascular: RRR, S1/S2 +, no rubs, no gallops Respiratory: CTA bilaterally, no wheezing, no rhonchi Abdominal: Soft, NT, ND, bowel sounds + Extremities: no edema, no cyanosis    The results of significant diagnostics from this hospitalization (including imaging, microbiology, ancillary and laboratory) are listed below for reference.     Microbiology: No results found for this or any previous visit (from the past 240 hour(s)).   Labs: BNP (last 3 results) No results for input(s): "BNP" in the last 8760 hours. Basic Metabolic Panel: Recent Labs  Lab 10/12/23 1352 10/13/23 0441  NA 137 137  K 3.5 3.6  CL 103 106  CO2 24 23  GLUCOSE 85 119*  BUN 10 10  CREATININE 0.73 0.76  CALCIUM 8.9 8.2*   Liver Function Tests: Recent Labs  Lab 10/12/23 1352  AST 51*  ALT 27  ALKPHOS 71  BILITOT 0.8  PROT 6.2*   ALBUMIN 3.1*   No results for input(s): "LIPASE", "AMYLASE" in the last 168 hours. No results for input(s): "AMMONIA" in the last 168 hours. CBC: Recent Labs  Lab 10/12/23 1352 10/13/23 0441  WBC 7.8 5.4  NEUTROABS 6.3  --   HGB 10.2* 8.5*  HCT 33.0* 28.3*  MCV 77.8* 77.5*  PLT 303 248   Cardiac Enzymes: Recent Labs  Lab 10/12/23 1352 10/13/23 0441  CKTOTAL 1,104* 665*   BNP: Invalid input(s): "POCBNP" CBG: Recent Labs  Lab 10/13/23 0207 10/13/23 0256  GLUCAP 66* 155*   D-Dimer No results for input(s): "DDIMER" in the last 72 hours. Hgb A1c No results for input(s): "HGBA1C" in the last 72 hours. Lipid Profile No results for input(s): "CHOL", "HDL", "LDLCALC", "TRIG", "CHOLHDL", "LDLDIRECT" in the last 72 hours. Thyroid function studies No results for input(s): "TSH", "T4TOTAL", "T3FREE", "THYROIDAB" in the last 72 hours.  Invalid input(s): "FREET3" Anemia work up No results for input(s): "VITAMINB12", "FOLATE", "FERRITIN", "TIBC", "IRON", "RETICCTPCT" in the last 72 hours. Urinalysis    Component Value Date/Time   COLORURINE YELLOW 10/22/2021 0129   APPEARANCEUR CLEAR 10/22/2021 0129   LABSPEC 1.019 10/22/2021 0129   PHURINE 5.0 10/22/2021 0129   GLUCOSEU NEGATIVE 10/22/2021 0129   HGBUR NEGATIVE 10/22/2021 0129   BILIRUBINUR NEGATIVE 10/22/2021 0129   KETONESUR 5 (A) 10/22/2021 0129   PROTEINUR NEGATIVE 10/22/2021 0129   UROBILINOGEN 0.2 12/09/2010 1407   NITRITE NEGATIVE 10/22/2021 0129   LEUKOCYTESUR NEGATIVE 10/22/2021 0129   Sepsis Labs Recent Labs  Lab 10/12/23  1352 10/13/23 0441  WBC 7.8 5.4   Microbiology No results found for this or any previous visit (from the past 240 hour(s)).   Time coordinating discharge: Over 35 minutes  SIGNED:   Marinda Elk, MD  [facility] 10/19/2023, 10:16 AM Pager   If 7PM-7AM, please contact night-coverage [email or web address] Password [email or web address]

## 2023-10-19 NOTE — Plan of Care (Signed)

## 2023-10-19 NOTE — Progress Notes (Signed)
Report called and given to Careers information officer  at [place].

## 2023-10-23 ENCOUNTER — Telehealth: Payer: Self-pay

## 2023-10-23 NOTE — Telephone Encounter (Signed)
Called and left a message for [name] legal guardian to call the office back regarding appt.

## 2023-10-23 NOTE — Telephone Encounter (Signed)
Called [name] back and scheduled appt on 12/12 at 2 pm. He is aware of appt date/time/

## 2023-10-23 NOTE — Telephone Encounter (Signed)
Called and left a message for Gabriel Rung, legal guardian asking him to call the office back about a future appt with Dr. Bertis Ruddy.

## 2023-10-23 NOTE — Telephone Encounter (Signed)
-----   Message from Artis Delay sent at 10/23/2023 12:43 PM EST ----- Path is back I can see him either on Tues or Thursday next week at 2 pm, 45 mins Please call his caregiver to get appt scheduled

## 2023-10-23 NOTE — Telephone Encounter (Signed)
Legal guardian called back. He is willing to come to appts and make decisions for [name]. Forwarded a message to Dr. Bertis Ruddy.

## 2023-10-24 LAB — SURGICAL PATHOLOGY

## 2023-11-01 ENCOUNTER — Encounter: Payer: Self-pay | Admitting: Hematology and Oncology

## 2023-11-01 ENCOUNTER — Other Ambulatory Visit: Payer: Self-pay

## 2023-11-01 ENCOUNTER — Inpatient Hospital Stay: Payer: Medicare (Managed Care) | Attending: Hematology and Oncology | Admitting: Hematology and Oncology

## 2023-11-01 VITALS — BP 112/68 | HR 82 | Temp 98.3°F | Resp 18 | Ht 67.5 in | Wt 168.8 lb

## 2023-11-01 DIAGNOSIS — C7801 Secondary malignant neoplasm of right lung: Secondary | ICD-10-CM

## 2023-11-01 DIAGNOSIS — Z7189 Other specified counseling: Secondary | ICD-10-CM | POA: Insufficient documentation

## 2023-11-01 DIAGNOSIS — C772 Secondary and unspecified malignant neoplasm of intra-abdominal lymph nodes: Secondary | ICD-10-CM

## 2023-11-01 DIAGNOSIS — Z602 Problems related to living alone: Secondary | ICD-10-CM | POA: Diagnosis not present

## 2023-11-01 DIAGNOSIS — R634 Abnormal weight loss: Secondary | ICD-10-CM | POA: Insufficient documentation

## 2023-11-01 DIAGNOSIS — Z87820 Personal history of traumatic brain injury: Secondary | ICD-10-CM | POA: Diagnosis not present

## 2023-11-01 DIAGNOSIS — S069X9D Unspecified intracranial injury with loss of consciousness of unspecified duration, subsequent encounter: Secondary | ICD-10-CM

## 2023-11-01 DIAGNOSIS — C259 Malignant neoplasm of pancreas, unspecified: Secondary | ICD-10-CM

## 2023-11-01 DIAGNOSIS — G893 Neoplasm related pain (acute) (chronic): Secondary | ICD-10-CM

## 2023-11-01 MED ORDER — OXYCODONE HCL 5 MG PO TABS: 5.0000 mg | ORAL_TABLET | ORAL | 0 refills | Status: DC | PRN

## 2023-11-01 MED ORDER — OXYCODONE HCL 5 MG PO TABS: 5.0000 mg | ORAL_TABLET | ORAL | 0 refills | Status: AC | PRN

## 2023-11-01 NOTE — Progress Notes (Signed)
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 Multifocal adenopathy as well as nodules, both subcutaneous and in the abdominopelvic cavity. Solid lesions are seen within the pancreas and left kidney, either of which could be site of primary. Recommend oncologic workup. 3. Left adrenal nodule typically metastatic. 4. Small right inguinal hernia contains nonobstructed small bowel. 5. Question of pericolonic edema about the descending colon, nonspecific. Aortic Atherosclerosis (ICD10-I70.0). Electronically Signed   By: Narda Rutherford M.D.   On: 10/12/2023 17:29   CT CERVICAL SPINE WO CONTRAST Result Date: 10/12/2023 CLINICAL DATA:  Neck trauma (Age >= 65y) Unwitnessed fall. EXAM: CT CERVICAL SPINE WITHOUT CONTRAST TECHNIQUE: Multidetector CT imaging of the cervical spine was performed without intravenous contrast. Multiplanar CT image reconstructions were also generated. RADIATION DOSE REDUCTION: This exam was performed according to the departmental dose-optimization program which includes automated  exposure control, adjustment of the mA and/or kV according to patient size and/or use of iterative reconstruction technique. COMPARISON:  None Available. FINDINGS: Alignment: Normal. Skull base and vertebrae: No acute fracture. Vertebral body heights are maintained. The dens and skull base are intact. Soft tissues and spinal canal: No prevertebral fluid or swelling. No visible canal hematoma. Lobulated soft tissue mass in the left neck measures 2.4 x 2.3 cm, series 9, image 76. Smaller adjacent soft tissue deposit, series 9, image 70. 18 mm soft tissue deposit in the left supraclavicular space series 9, image 80. Disc levels: Mild anterior spurring at multiple levels with preservation of disc spaces. Mild scattered facet hypertrophy. Upper chest: Assessed on concurrent chest CT, reported separately. Other: Carotid calcifications. IMPRESSION: 1. No acute fracture or subluxation of the cervical spine. 2. Lobulated soft tissue masses/nodules in the left neck and supraclavicular region, suspicious for adenopathy. This is greater than typically seen with reactive changes. Findings are suspicious for metastatic disease or lymphoproliferative disorder such as lymphoma. Electronically Signed   By: Narda Rutherford M.D.   On: 10/12/2023 17:12   CT HEAD WO CONTRAST ( ) Result Date: 10/12/2023 CLINICAL DATA:  Head trauma, minor (Age >= 65y) Unwitnessed fall. EXAM: CT HEAD WITHOUT CONTRAST TECHNIQUE: Contiguous axial images were obtained from the base of the skull through  the vertex without intravenous contrast. RADIATION DOSE REDUCTION: This exam was performed according to the departmental dose-optimization program which includes automated exposure control, adjustment of the mA and/or kV according to patient size and/or use of iterative reconstruction technique. COMPARISON:  10/21/2021 FINDINGS: Brain: No evidence of acute infarction, hemorrhage, hydrocephalus, extra-axial collection or mass lesion/mass effect.  Generalized atrophy. Again seen cavum septum pellucidum. Moderate to advanced periventricular and deep white matter hypodensity typical of chronic small vessel ischemia. Remote right frontal and left temporal infarcts. Vascular: Atherosclerosis of skullbase vasculature without hyperdense vessel or abnormal calcification. Skull: No fracture or focal lesion. Sinuses/Orbits: No acute findings. Surgical change in the right superior orbit. Mucosal thickening throughout the ethmoid air cells, chronic. No mastoid effusion. Other: Mild left parietooccipital scalp hematoma IMPRESSION: 1. Mild left parietooccipital scalp hematoma. No acute intracranial abnormality. No skull fracture. 2. Atrophy, chronic small vessel ischemia, and remote infarcts. Electronically Signed   By: Narda Rutherford M.D.   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 Due to his traumatic brain injury, he is not able to comprehend risk, toxicities and side effects and treatment I recommend palliative care referral with hospice care I do not recommend the patient to return back to living independently due to anticipate that progression of symptoms  TBI (traumatic brain injury) (HCC) I gave written recommendation for him to reside in residential facility for end-of-life care  Cancer associated pain The patient has pain I recommend low-dose oxycodone Recommend palliative care referral with hospice to adjust his pain medicine and for symptom management  Goals of care, counseling/discussion I reviewed plan of care with caregivers and healthcare power of attorney I recommend DO NOT RESUSCITATE CODE STATUS and give them an order to bring back to the skilled nursing facility I recommend palliative care with hospice referral Her estimated prognosis to be less than 6  months  No orders of the defined types were placed in this encounter.   All questions were answered. The patient knows to call the clinic with any problems, questions or concerns. The total time spent in the appointment was 60 minutes encounter with patients including review of chart and various tests results, discussions about plan of care and coordination of care plan   Artis Delay, MD 11/01/2023 2:51 PM

## 2023-11-01 NOTE — Assessment & Plan Note (Signed)
I gave written recommendation for him to reside in residential facility for end-of-life care

## 2023-11-01 NOTE — Assessment & Plan Note (Signed)
The patient has pain I recommend low-dose oxycodone Recommend palliative care referral with hospice to adjust his pain medicine and for symptom management

## 2023-11-01 NOTE — Assessment & Plan Note (Signed)
I reviewed plan of care with caregivers and healthcare power of attorney I recommend DO NOT RESUSCITATE CODE STATUS and give them an order to bring back to the skilled nursing facility I recommend palliative care with hospice referral Her estimated prognosis to be less than 6 months

## 2023-11-01 NOTE — Assessment & Plan Note (Signed)
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

## 2024-01-12 ENCOUNTER — Encounter (HOSPITAL_COMMUNITY): Payer: Self-pay | Admitting: Emergency Medicine

## 2024-01-12 ENCOUNTER — Other Ambulatory Visit: Payer: Self-pay

## 2024-01-12 ENCOUNTER — Emergency Department (HOSPITAL_COMMUNITY)
Admission: EM | Admit: 2024-01-12 | Discharge: 2024-01-13 | Disposition: A | Discharge status: Skilled Nursing Facility | Payer: Medicare (Managed Care) | Attending: Emergency Medicine | Admitting: Emergency Medicine

## 2024-01-12 ENCOUNTER — Emergency Department (HOSPITAL_COMMUNITY): Payer: Medicare (Managed Care)

## 2024-01-12 DIAGNOSIS — W06XXXA Fall from bed, initial encounter: Secondary | ICD-10-CM | POA: Insufficient documentation

## 2024-01-12 DIAGNOSIS — Y92122 Bedroom in nursing home as the place of occurrence of the external cause: Secondary | ICD-10-CM | POA: Diagnosis not present

## 2024-01-12 DIAGNOSIS — Z8507 Personal history of malignant neoplasm of pancreas: Secondary | ICD-10-CM | POA: Insufficient documentation

## 2024-01-12 DIAGNOSIS — S0001XA Abrasion of scalp, initial encounter: Secondary | ICD-10-CM | POA: Insufficient documentation

## 2024-01-12 DIAGNOSIS — L98499 Non-pressure chronic ulcer of skin of other sites with unspecified severity: Secondary | ICD-10-CM | POA: Diagnosis not present

## 2024-01-12 DIAGNOSIS — I10 Essential (primary) hypertension: Secondary | ICD-10-CM | POA: Insufficient documentation

## 2024-01-12 DIAGNOSIS — Z23 Encounter for immunization: Secondary | ICD-10-CM | POA: Insufficient documentation

## 2024-01-12 DIAGNOSIS — W19XXXA Unspecified fall, initial encounter: Secondary | ICD-10-CM

## 2024-01-12 DIAGNOSIS — S0990XA Unspecified injury of head, initial encounter: Secondary | ICD-10-CM | POA: Diagnosis present

## 2024-01-12 DIAGNOSIS — S0091XA Abrasion of unspecified part of head, initial encounter: Secondary | ICD-10-CM

## 2024-01-12 LAB — CBC WITH DIFFERENTIAL/PLATELET
Abs Immature Granulocytes: 0.05 10*3/uL (ref 0.00–0.07)
Basophils Absolute: 0.1 10*3/uL (ref 0.0–0.1)
Basophils Relative: 1 %
Eosinophils Absolute: 0.5 10*3/uL (ref 0.0–0.5)
Eosinophils Relative: 6 %
HCT: 35.4 % — ABNORMAL LOW (ref 39.0–52.0)
Hemoglobin: 11.2 g/dL — ABNORMAL LOW (ref 13.0–17.0)
Immature Granulocytes: 1 %
Lymphocytes Relative: 8 %
Lymphs Abs: 0.7 10*3/uL (ref 0.7–4.0)
MCH: 25.9 pg — ABNORMAL LOW (ref 26.0–34.0)
MCHC: 31.6 g/dL (ref 30.0–36.0)
MCV: 81.9 fL (ref 80.0–100.0)
Monocytes Absolute: 0.7 10*3/uL (ref 0.1–1.0)
Monocytes Relative: 8 %
Neutro Abs: 6.6 10*3/uL (ref 1.7–7.7)
Neutrophils Relative %: 76 %
Platelets: 326 10*3/uL (ref 150–400)
RBC: 4.32 MIL/uL (ref 4.22–5.81)
RDW: 17.5 % — ABNORMAL HIGH (ref 11.5–15.5)
WBC: 8.6 10*3/uL (ref 4.0–10.5)
nRBC: 0 % (ref 0.0–0.2)

## 2024-01-12 MED ORDER — TETANUS-DIPHTH-ACELL PERTUSSIS 5-2.5-18.5 LF-MCG/0.5 IM SUSY
0.5000 mL | PREFILLED_SYRINGE | Freq: Once | INTRAMUSCULAR | Status: AC
  Administered 2024-01-12: 0.5 mL via INTRAMUSCULAR
  Filled 2024-01-12: qty 0.5

## 2024-01-12 NOTE — ED Provider Notes (Signed)
  EMERGENCY DEPARTMENT AT [facility] Provider Note   CSN: [id number] Arrival date & time: 01/12/24  2228     History {Add pertinent medical, surgical, social history, OB history to HPI:1} Chief Complaint  Patient presents with  . Fall    [name] is a 73 y.o. male with a past medical history of traumatic brain injury, cognitive impairment, hypertension and recent diagnosis of metastatic diffuse neoplastic disease from primary pancreatic cancer.  He is currently a resident at [facility] skilled nursing facility.  He apparently was trying to ambulate and fell out of his bed hitting his head.  The fall was unwitnessed.  He is also complaining of pain in his bottom feels like he needs to make a bowel movement but has not made with today and states that currently he does not feel like he has to make a bowel movement at this time.  The history is provided by the patient. No language interpreter was used.  Fall       Home Medications Prior to Admission medications   Medication Sig Start Date End Date Taking? Authorizing Provider  acetaminophen (TYLENOL) 500 MG tablet Take 500 mg by mouth every 8 (eight) hours as needed for headache or mild pain. 06/14/15   [provider]  albuterol (VENTOLIN HFA) 108 (90 Base) MCG/ACT inhaler Inhale 1-2 puffs into the lungs every 6 (six) hours as needed for wheezing or shortness of breath.    [provider]  ascorbic acid (VITAMIN C) 250 MG tablet Take 1 tablet (250 mg total) by mouth 2 (two) times daily. Patient not taking: Reported on 10/12/2023 10/25/21   [name] Salter, MD  atorvastatin (LIPITOR) 20 MG tablet Take 20 mg by mouth daily.    [provider]  budesonide-formoterol (SYMBICORT) 160-4.5 MCG/ACT inhaler Inhale 2 puffs into the lungs 2 (two) times daily.    [provider]  ferrous sulfate 325 (65 FE) MG tablet Take 1 tablet (325 mg total) by mouth daily. 10/20/23   Marinda Elk, MD  ketoconazole (NIZORAL) 2 % shampoo Apply 1 application topically 2 (two) times a week.    [provider]  mirabegron ER (MYRBETRIQ) 25 MG TB24 tablet Take 25 mg by mouth daily.    [provider]  Multiple Vitamin (MULTIVITAMIN WITH MINERALS) TABS tablet Take 1 tablet by mouth daily. 10/26/21   [name] Salter, MD  oxyCODONE (OXY IR/ROXICODONE) 5 MG immediate release tablet Take 1 tablet (5 mg total) by mouth every 4 (four) hours as needed for severe pain (pain score 7-10). 11/01/23   Artis Delay, MD  tamsulosin (FLOMAX) 0.4 MG CAPS capsule Take 0.4 mg by mouth daily. 02/23/16   [provider]  Vitamin D, Ergocalciferol, (DRISDOL) 1.25 MG (50000 UNIT) CAPS capsule Take 50,000 Units by mouth every 30 (thirty) days.    [provider]      Allergies    Patient has no known allergies.    Review of Systems   Review of Systems  Physical Exam Updated Vital Signs BP 130/88 (BP Location: Right Arm)   Pulse 93   Temp 99 F (37.2 C) (Oral)   Resp 16   SpO2 98%  Physical Exam Vitals and nursing note reviewed.  Constitutional:      General: He is not in acute distress.    Appearance: He is well-developed. He is not diaphoretic.  HENT:     Head: Normocephalic.     Comments: Laceration to  the right upper occiput no active bleeding Eyes:     General: No scleral icterus.    Conjunctiva/sclera: Conjunctivae normal.  Cardiovascular:     Rate and Rhythm: Normal rate and regular rhythm.     Heart sounds: Normal heart sounds.  Pulmonary:     Effort: Pulmonary effort is normal. No respiratory distress.     Breath sounds: Normal breath sounds.  Abdominal:     Palpations: Abdomen is soft.     Tenderness: There is no abdominal tenderness.  Musculoskeletal:     Cervical back: Normal range of motion and neck supple.  Skin:    General: Skin is warm and dry.  Neurological:     Mental Status: He is alert.  Psychiatric:        Behavior: Behavior  normal.     ED Results / Procedures / Treatments   Labs (all labs ordered are listed, but only abnormal results are displayed) Labs Reviewed - No data to display  EKG None  Radiology No results found.  Procedures Procedures  {Document cardiac monitor, telemetry assessment procedure when appropriate:1}  Medications Ordered in ED Medications - No data to display  ED Course/ Medical Decision Making/ A&P Clinical Course as of 01/12/24 2310  Sat Jan 12, 2024  2309 Last Tdap 02/09/2015. Tdap ordered  [AH]    Clinical Course User Index [AH] Arthor Captain, PA-C   {   Click here for ABCD2, HEART and other calculatorsREFRESH Note before signing :1}                              Medical Decision Making Amount and/or Complexity of Data Reviewed Radiology: ordered.   ***  {Document critical care time when appropriate:1} {Document review of labs and clinical decision tools ie heart score, Chads2Vasc2 etc:1}  {Document your independent review of radiology images, and any outside records:1} {Document your discussion with family members, caretakers, and with consultants:1} {Document social determinants of health affecting pt's care:1} {Document your decision making why or why not admission, treatments were needed:1} Final Clinical Impression(s) / ED Diagnoses Final diagnoses:  None    Rx / DC Orders ED Discharge Orders     None

## 2024-01-12 NOTE — ED Triage Notes (Signed)
 Pt BIB EMS from [facility] for unwitnessed fall. +HS, -LOC, -thinners. Pt states he was attempting to get out of bed and use his walker but his bed had not been lowered and was higher than normal. Lac to crown of scalp. Pt also endorsing rectal and penile pain, pt w/ known hx hemorrhoids and per facility did have BM today. Pt alert and oriented x4.

## 2024-01-13 LAB — CK: Total CK: 74 U/L (ref 49–397)

## 2024-01-13 LAB — BASIC METABOLIC PANEL
Anion gap: 14 (ref 5–15)
BUN: 20 mg/dL (ref 8–23)
CO2: 19 mmol/L — ABNORMAL LOW (ref 22–32)
Calcium: 9.5 mg/dL (ref 8.9–10.3)
Chloride: 102 mmol/L (ref 98–111)
Creatinine, Ser: 0.8 mg/dL (ref 0.61–1.24)
GFR, Estimated: >60 mL/min (ref >60)
Glucose, Bld: 115 mg/dL — ABNORMAL HIGH (ref 70–99)
Potassium: 4.2 mmol/L (ref 3.5–5.1)
Sodium: 135 mmol/L (ref 135–145)

## 2024-01-13 MED ORDER — LIDOCAINE HCL (PF) 1 % IJ SOLN
5.0000 mL | Freq: Once | INTRAMUSCULAR | Status: DC
  Filled 2024-01-13: qty 5

## 2024-01-13 MED ORDER — LIDOCAINE-EPINEPHRINE-TETRACAINE (LET) TOPICAL GEL
3.0000 mL | Freq: Once | TOPICAL | Status: AC
  Administered 2024-01-13: 3 mL via TOPICAL
  Filled 2024-01-13: qty 3

## 2024-01-13 NOTE — ED Notes (Signed)
 Report attempted to [facility] x2

## 2024-01-13 NOTE — ED Notes (Signed)
 Report attempted to [facility] facility x3, no answer

## 2024-01-13 NOTE — Discharge Instructions (Signed)
 Contact a health care provider if: You got a tetanus shot, and you have swelling, severe pain, redness, or bleeding at the site of your shot. Your pain is worse and medicines do not help. You have a fever. You have any of signs of infection. Get help right away if: You have a red streak spreading away from your wound.

## 2024-01-13 NOTE — ED Notes (Signed)
 [facility] called for transport back to [place] facility at this time

## 2024-01-13 NOTE — ED Notes (Signed)
 [facility] here to transport patient. This RN attempted report to [facility] at this time and did not receive an answer. Will attempt again.
